# Patient Record
Sex: Female | Born: 2000 | Race: Black or African American | Hispanic: No | Marital: Single | Smoking: Former smoker
Health system: Southern US, Community
[De-identification: ages and names within clinical notes are randomized; demographics above are authoritative.]

## PROBLEM LIST (undated history)

## (undated) DIAGNOSIS — R1116 Cannabis hyperemesis syndrome: Secondary | ICD-10-CM

## (undated) DIAGNOSIS — K219 Gastro-esophageal reflux disease without esophagitis: Secondary | ICD-10-CM

## (undated) DIAGNOSIS — Z7289 Other problems related to lifestyle: Secondary | ICD-10-CM

## (undated) DIAGNOSIS — R519 Headache, unspecified: Secondary | ICD-10-CM

## (undated) DIAGNOSIS — R1110 Vomiting, unspecified: Secondary | ICD-10-CM

## (undated) DIAGNOSIS — E119 Type 2 diabetes mellitus without complications: Secondary | ICD-10-CM

---
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
## 2018-04-24 DIAGNOSIS — U071 COVID-19: Secondary | ICD-10-CM

## 2018-04-24 HISTORY — DX: COVID-19: U07.1

## 2018-07-16 DIAGNOSIS — E860 Dehydration: Secondary | ICD-10-CM | POA: Insufficient documentation

## 2019-02-19 ENCOUNTER — Inpatient Hospital Stay (HOSPITAL_COMMUNITY)
Admission: EM | Admit: 2019-02-19 | Discharge: 2019-02-22 | DRG: 639 | Disposition: A | Discharge status: Home / Self Care | Payer: Federal, State, Local not specified - PPO | Attending: Family Medicine | Admitting: Family Medicine

## 2019-02-19 ENCOUNTER — Other Ambulatory Visit: Payer: Self-pay

## 2019-02-19 ENCOUNTER — Encounter (HOSPITAL_COMMUNITY): Payer: Self-pay | Admitting: Emergency Medicine

## 2019-02-19 DIAGNOSIS — E860 Dehydration: Secondary | ICD-10-CM | POA: Diagnosis not present

## 2019-02-19 DIAGNOSIS — R1110 Vomiting, unspecified: Secondary | ICD-10-CM

## 2019-02-19 DIAGNOSIS — R112 Nausea with vomiting, unspecified: Secondary | ICD-10-CM

## 2019-02-19 DIAGNOSIS — E1065 Type 1 diabetes mellitus with hyperglycemia: Principal | ICD-10-CM | POA: Diagnosis present

## 2019-02-19 DIAGNOSIS — R1115 Cyclical vomiting syndrome unrelated to migraine: Secondary | ICD-10-CM | POA: Diagnosis present

## 2019-02-19 DIAGNOSIS — R739 Hyperglycemia, unspecified: Secondary | ICD-10-CM

## 2019-02-19 DIAGNOSIS — N946 Dysmenorrhea, unspecified: Secondary | ICD-10-CM | POA: Diagnosis present

## 2019-02-19 DIAGNOSIS — E876 Hypokalemia: Secondary | ICD-10-CM | POA: Diagnosis present

## 2019-02-19 DIAGNOSIS — F1290 Cannabis use, unspecified, uncomplicated: Secondary | ICD-10-CM | POA: Diagnosis present

## 2019-02-19 DIAGNOSIS — A084 Viral intestinal infection, unspecified: Secondary | ICD-10-CM | POA: Diagnosis present

## 2019-02-19 DIAGNOSIS — Z794 Long term (current) use of insulin: Secondary | ICD-10-CM

## 2019-02-19 DIAGNOSIS — E109 Type 1 diabetes mellitus without complications: Secondary | ICD-10-CM | POA: Diagnosis present

## 2019-02-19 DIAGNOSIS — Z833 Family history of diabetes mellitus: Secondary | ICD-10-CM

## 2019-02-19 DIAGNOSIS — Z79899 Other long term (current) drug therapy: Secondary | ICD-10-CM

## 2019-02-19 DIAGNOSIS — E1029 Type 1 diabetes mellitus with other diabetic kidney complication: Secondary | ICD-10-CM | POA: Clinically undetermined

## 2019-02-19 DIAGNOSIS — Z20828 Contact with and (suspected) exposure to other viral communicable diseases: Secondary | ICD-10-CM | POA: Diagnosis present

## 2019-02-19 HISTORY — DX: Other problems related to lifestyle: Z72.89

## 2019-02-19 LAB — URINALYSIS, ROUTINE W REFLEX MICROSCOPIC
Bacteria, UA: NONE SEEN
Bilirubin Urine: NEGATIVE
Glucose, UA: >=500 mg/dL — AB
Ketones, ur: 20 mg/dL — AB
Leukocytes,Ua: NEGATIVE
Nitrite: NEGATIVE
Protein, ur: NEGATIVE mg/dL
Specific Gravity, Urine: 1.035 — ABNORMAL HIGH (ref 1.005–1.030)
pH: 7 (ref 5.0–8.0)

## 2019-02-19 LAB — CBC
HCT: 42 % (ref 36.0–46.0)
Hemoglobin: 14.3 g/dL (ref 12.0–15.0)
MCH: 31.4 pg (ref 26.0–34.0)
MCHC: 34 g/dL (ref 30.0–36.0)
MCV: 92.3 fL (ref 80.0–100.0)
Platelets: 498 10*3/uL — ABNORMAL HIGH (ref 150–400)
RBC: 4.55 MIL/uL (ref 3.87–5.11)
RDW: 12.9 % (ref 11.5–15.5)
WBC: 17.2 10*3/uL — ABNORMAL HIGH (ref 4.0–10.5)
nRBC: 0 % (ref 0.0–0.2)

## 2019-02-19 LAB — BASIC METABOLIC PANEL
Anion gap: 15 (ref 5–15)
BUN: 10 mg/dL (ref 6–20)
CO2: 19 mmol/L — ABNORMAL LOW (ref 22–32)
Calcium: 9.8 mg/dL (ref 8.9–10.3)
Chloride: 103 mmol/L (ref 98–111)
Creatinine, Ser: 0.76 mg/dL (ref 0.44–1.00)
GFR calc Af Amer: >60 mL/min (ref >60)
GFR calc non Af Amer: >60 mL/min (ref >60)
Glucose, Bld: 315 mg/dL — ABNORMAL HIGH (ref 70–99)
Potassium: 4 mmol/L (ref 3.5–5.1)
Sodium: 137 mmol/L (ref 135–145)

## 2019-02-19 LAB — COMPREHENSIVE METABOLIC PANEL
ALT: 17 U/L (ref 0–44)
AST: 21 U/L (ref 15–41)
Albumin: 4.1 g/dL (ref 3.5–5.0)
Alkaline Phosphatase: 71 U/L (ref 38–126)
Anion gap: 16 — ABNORMAL HIGH (ref 5–15)
BUN: 10 mg/dL (ref 6–20)
CO2: 18 mmol/L — ABNORMAL LOW (ref 22–32)
Calcium: 9.2 mg/dL (ref 8.9–10.3)
Chloride: 101 mmol/L (ref 98–111)
Creatinine, Ser: 1 mg/dL (ref 0.44–1.00)
GFR calc Af Amer: >60 mL/min (ref >60)
GFR calc non Af Amer: >60 mL/min (ref >60)
Glucose, Bld: 417 mg/dL — ABNORMAL HIGH (ref 70–99)
Potassium: 3.2 mmol/L — ABNORMAL LOW (ref 3.5–5.1)
Sodium: 135 mmol/L (ref 135–145)
Total Bilirubin: 0.5 mg/dL (ref 0.3–1.2)
Total Protein: 7.4 g/dL (ref 6.5–8.1)

## 2019-02-19 LAB — POCT I-STAT EG7
Acid-base deficit: 6 mmol/L — ABNORMAL HIGH (ref 0.0–2.0)
Bicarbonate: 19 mmol/L — ABNORMAL LOW (ref 20.0–28.0)
Calcium, Ion: 1.17 mmol/L (ref 1.15–1.40)
HCT: 48 % — ABNORMAL HIGH (ref 36.0–46.0)
Hemoglobin: 16.3 g/dL — ABNORMAL HIGH (ref 12.0–15.0)
O2 Saturation: 57 %
Potassium: 4 mmol/L (ref 3.5–5.1)
Sodium: 138 mmol/L (ref 135–145)
TCO2: 20 mmol/L — ABNORMAL LOW (ref 22–32)
pCO2, Ven: 36.6 mmHg — ABNORMAL LOW (ref 44.0–60.0)
pH, Ven: 7.324 (ref 7.250–7.430)
pO2, Ven: 32 mmHg (ref 32.0–45.0)

## 2019-02-19 LAB — CBG MONITORING, ED
Glucose-Capillary: 394 mg/dL — ABNORMAL HIGH (ref 70–99)
Glucose-Capillary: 221 mg/dL — ABNORMAL HIGH (ref 70–99)

## 2019-02-19 LAB — LACTIC ACID, PLASMA
Lactic Acid, Venous: 3 mmol/L (ref 0.5–1.9)
Lactic Acid, Venous: 3.1 mmol/L (ref 0.5–1.9)

## 2019-02-19 LAB — I-STAT BETA HCG BLOOD, ED (MC, WL, AP ONLY): I-stat hCG, quantitative: <5 m[IU]/mL (ref <5)

## 2019-02-19 LAB — LIPASE, BLOOD: Lipase: 23 U/L (ref 11–51)

## 2019-02-19 MED ORDER — MORPHINE SULFATE (PF) 4 MG/ML IV SOLN
4.0000 mg | Freq: Once | INTRAVENOUS | Status: AC
  Administered 2019-02-19: 4 mg via INTRAVENOUS
  Filled 2019-02-19: qty 1

## 2019-02-19 MED ORDER — SODIUM CHLORIDE 0.9 % IV BOLUS
1000.0000 mL | Freq: Once | INTRAVENOUS | Status: AC
  Administered 2019-02-19: 1000 mL via INTRAVENOUS

## 2019-02-19 MED ORDER — HYDROCODONE-ACETAMINOPHEN 5-325 MG PO TABS: 1.0000 | ORAL_TABLET | Freq: Once | ORAL | Status: DC

## 2019-02-19 MED ORDER — INSULIN ASPART 100 UNIT/ML ~~LOC~~ SOLN
10.0000 [IU] | Freq: Once | SUBCUTANEOUS | Status: AC
  Administered 2019-02-19: 10 [IU] via INTRAVENOUS

## 2019-02-19 MED ORDER — POTASSIUM CHLORIDE 10 MEQ/100ML IV SOLN
10.0000 meq | INTRAVENOUS | Status: AC
  Administered 2019-02-19 (×2): 10 meq via INTRAVENOUS
  Filled 2019-02-19: qty 100

## 2019-02-19 MED ORDER — SODIUM CHLORIDE 0.9% FLUSH
3.0000 mL | Freq: Once | INTRAVENOUS | Status: AC
  Administered 2019-02-19: 3 mL via INTRAVENOUS

## 2019-02-19 MED ORDER — PROMETHAZINE HCL 25 MG/ML IJ SOLN
25.0000 mg | Freq: Four times a day (QID) | INTRAMUSCULAR | Status: DC | PRN
  Administered 2019-02-19 (×2): 25 mg via INTRAVENOUS
  Filled 2019-02-19: qty 1

## 2019-02-19 MED ORDER — ONDANSETRON HCL 4 MG/2ML IJ SOLN
4.0000 mg | Freq: Once | INTRAMUSCULAR | Status: AC
  Administered 2019-02-19: 4 mg via INTRAVENOUS
  Filled 2019-02-19: qty 2

## 2019-02-19 MED ORDER — ONDANSETRON 4 MG PO TBDP: 4.0000 mg | ORAL_TABLET | Freq: Once | ORAL | Status: DC | PRN

## 2019-02-19 MED ORDER — KETOROLAC TROMETHAMINE 30 MG/ML IJ SOLN
30.0000 mg | Freq: Once | INTRAMUSCULAR | Status: AC
  Administered 2019-02-19: 30 mg via INTRAVENOUS
  Filled 2019-02-19: qty 1

## 2019-02-19 NOTE — ED Notes (Signed)
2nd IV blown with infiltration and pain noted.  IV team consult order placed.

## 2019-02-19 NOTE — ED Provider Notes (Signed)
[facility] EMERGENCY DEPARTMENT Provider Note   CSN: [id number] Arrival date & time: 02/19/19  0505     History   Chief Complaint Chief Complaint  Patient presents with  . Emesis    HPI Judy Cook is a 18 y.o. female.     HPI Patient presents to the emergency department with nausea vomiting that started around 2 AM.  The patient states that she took her Lantus and NovoLog prior to arrival.  The patient denies chest pain, shortness of breath, headache,blurred vision, neck pain, fever, cough, weakness, numbness, dizziness, anorexia, edema, abdominal pain,  diarrhea, rash, back pain, dysuria, hematemesis, bloody stool, near syncope, or syncope.  Patient states that she has not been able to keep any fluids down since that time.  Patient not take any other medications prior to arrival for her symptoms. Past Medical History:  Diagnosis Date  . Diabetes mellitus without complication (HCC)     There are no active problems to display for this patient.   History reviewed. No pertinent surgical history.   OB History   No obstetric history on file.      Home Medications    Prior to Admission medications   Medication Sig Start Date End Date Taking? Authorizing Provider  LANTUS SOLOSTAR 100 UNIT/ML Solostar Pen Inject 23 Units into the skin at bedtime. 02/04/19  Yes [provider]  Multiple Vitamins-Minerals (MULTIVITAMIN WITH MINERALS) tablet Take 1 tablet by mouth daily.   Yes [provider]  naproxen (NAPROSYN) 500 MG tablet Take 500 mg by mouth 2 (two) times daily. 09/17/18  Yes [provider]  NOVOLOG FLEXPEN 100 UNIT/ML FlexPen Inject 12 Units into the skin 3 (three) times daily before meals. Ratio/sliding scale ( Adjust carb) 02/04/19  Yes [provider]    Family History No family history on file.  Social History Social History   Tobacco Use  . Smoking status: Never Smoker  . Smokeless tobacco:  Never Used  Substance Use Topics  . Alcohol use: Never    Frequency: Never  . Drug use: Never     Allergies   Patient has no known allergies.   Review of Systems Review of Systems All other systems negative except as documented in the HPI. All pertinent positives and negatives as reviewed in the HPI.  Physical Exam Updated Vital Signs BP 130/83   Pulse 66   Temp 98.1 F (36.7 C) (Oral)   Resp 17   LMP 02/18/2019   SpO2 100%   Physical Exam Vitals signs and nursing note reviewed.  Constitutional:      General: She is not in acute distress.    Appearance: She is well-developed.  HENT:     Head: Normocephalic and atraumatic.  Eyes:     Pupils: Pupils are equal, round, and reactive to light.  Neck:     Musculoskeletal: Normal range of motion and neck supple.  Cardiovascular:     Rate and Rhythm: Normal rate and regular rhythm.     Heart sounds: Normal heart sounds. No murmur. No friction rub. No gallop.   Pulmonary:     Effort: Pulmonary effort is normal. No respiratory distress.     Breath sounds: Normal breath sounds. No wheezing.  Abdominal:     General: Bowel sounds are normal. There is no distension.     Palpations: Abdomen is soft.     Tenderness: There is no abdominal tenderness.  Skin:    General: Skin is warm and  dry.     Capillary Refill: Capillary refill takes less than 2 seconds.     Findings: No erythema or rash.  Neurological:     Mental Status: She is alert and oriented to person, place, and time.     Motor: No abnormal muscle tone.     Coordination: Coordination normal.  Psychiatric:        Behavior: Behavior normal.      ED Treatments / Results  Labs (all labs ordered are listed, but only abnormal results are displayed) Labs Reviewed  COMPREHENSIVE METABOLIC PANEL - Abnormal; Notable for the following components:      Result Value   Potassium 3.2 (*)    CO2 18 (*)    Glucose, Bld 417 (*)    Anion gap 16 (*)    All other components  within normal limits  CBC - Abnormal; Notable for the following components:   WBC 17.2 (*)    Platelets 498 (*)    All other components within normal limits  URINALYSIS, ROUTINE W REFLEX MICROSCOPIC - Abnormal; Notable for the following components:   Color, Urine STRAW (*)    Specific Gravity, Urine 1.035 (*)    Glucose, UA >=500 (*)    Hgb urine dipstick LARGE (*)    Ketones, ur 20 (*)    All other components within normal limits  BASIC METABOLIC PANEL - Abnormal; Notable for the following components:   CO2 19 (*)    Glucose, Bld 315 (*)    All other components within normal limits  LACTIC ACID, PLASMA - Abnormal; Notable for the following components:   Lactic Acid, Venous 3.0 (*)    All other components within normal limits  CBG MONITORING, ED - Abnormal; Notable for the following components:   Glucose-Capillary 394 (*)    All other components within normal limits  POCT I-STAT EG7 - Abnormal; Notable for the following components:   pCO2, Ven 36.6 (*)    Bicarbonate 19.0 (*)    TCO2 20 (*)    Acid-base deficit 6.0 (*)    HCT 48.0 (*)    Hemoglobin 16.3 (*)    All other components within normal limits  LIPASE, BLOOD  LACTIC ACID, PLASMA  I-STAT BETA HCG BLOOD, ED ([facility], [facility], [facility] ONLY)    EKG None  Radiology No results found.  Procedures Procedures (including critical care time)  Medications Ordered in ED Medications  sodium chloride 0.9 % bolus 1,000 mL (has no administration in time range)  promethazine (PHENERGAN) injection 25 mg (has no administration in time range)  ketorolac (TORADOL) 30 MG/ML injection 30 mg (has no administration in time range)  insulin aspart (novoLOG) injection 10 Units (has no administration in time range)  sodium chloride flush (NS) 0.9 % injection 3 mL (3 mLs Intravenous Given 02/19/19 1251)  sodium chloride 0.9 % bolus 1,000 mL (1,000 mLs Intravenous New Bag/Given 02/19/19 1251)  ondansetron (ZOFRAN) injection 4 mg (4 mg Intravenous Given  02/19/19 1306)  potassium chloride 10 mEq in 100 mL IVPB (10 mEq Intravenous New Bag/Given 02/19/19 1310)     Initial Impression / Assessment and Plan / ED Course  I have reviewed the triage vital signs and the nursing notes.  Pertinent labs & imaging results that were available during my care of the patient were reviewed by me and considered in my medical decision making (see chart for details).  Clinical Course as of Feb 18 1442  Wed Feb 19, 2019  1134 Ketones, ur(!): 20 [  CA]    Clinical Course User Index [CA] T[place], [place], [place]       Patient did have some initial findings that showed dehydration and those seem to have corrected somewhat on reevaluation of her blood work.  Patient had yet to receive a second liter of fluid.  Patient will be given further antiemetics and a second liter of fluid.  The patient's IV had blown.  Patient will be reevaluated after the antiemetics and further fluids.  Final Clinical Impressions(s) / ED Diagnoses   Final diagnoses:  None    ED Discharge Orders    None       Dalia Heading, PA-C 02/19/19 1446    Little, Wenda Overland, MD 02/20/19 2352

## 2019-02-19 NOTE — ED Notes (Signed)
IV restarted by IV team.  Pt has had intermittent dry heaves, is resting at this time.  IV infusing well without difficulties at this time.

## 2019-02-19 NOTE — ED Triage Notes (Signed)
Pt c/o abdominal nausea/vomiting that started yesterday. Hx type 1 diabetes. A&O x 4.

## 2019-02-19 NOTE — ED Notes (Signed)
ED Provider at bedside. 

## 2019-02-19 NOTE — H&P (Addendum)
Family Medicine Teaching [facility] Admission History and Physical Service Pager: [phone]  Patient name: Judy Cook Medical record number: [id number] Date of birth: 12-30-2000 Age: 18 y.o. Gender: female  Primary Care Provider: System, Pcp Not In Consultants: None Code Status: Full Preferred Emergency Contact: Sister, Judy Cook    Chief Complaint: nausea, emesis and abdominal pain   Assessment and Plan: Judy Cook is a 18 y.o. female presenting with abdominal pain and emesis for 1 day. PMH is significant for T1DM.   Lactic Acidosis, Hyperglycemia and Emesis in setting of T1DM Judy Cook reports that at 2 AM on 10/28 she began having nausea and emesis with inability to tolerate p.o. solids or fluids.  Patient states that she has had multiple episodes like this in the past and has required admission to the hospital with the last episode being in [facility]. Home medications include 23 untis of Lantus at night with Cook units TID with meals.  Patient states that she moves around frequently and has to reestablish care often.  Patient states that she has established care with an endocrinologist in town. Patient states that she was diagnosed with type 1 diabetes at the age of 89.  Patient slept significant for leukocytosis of 17, glucose elevated it 394 initially, elevated lactic acid of 3.0, 3.1 on repeat, UA with elevated specific gravity, greater than 500 of glucose, large hemoglobin and ketones.  No DKA on admission. Of note, patient initially had anion gap elevated at 16. Patient's last A1c 10.3 on 12/10/2018. Patient's hemoglobin A1c subsequently found to be elevated at 10.8 here.  Nausea and emesis  in the setting of viral gastroenteritis given acuteness of illness. Patient also endorses smoking marijuana frequently and given that she has had multiple hospitalizations for the same cause could be secondary to cannabis use. -Place in observation- MedSurg,  attending Dr. McDiarmid -Continue mIVF NS with potassium chloride at 100 mL/h -Zofran 18 Tylenol 650 as needed -CBG monitoring with meals and at bedtime -Sensitive sliding scale insulin -Lantus 23 units daily at bedtime -NovoLog 10 units with meals 3 times daily only if patient is able to tolerate p.o. -Trend lactic acid  -Monitor I's and O's -Will advance diet as tolerated, soft diet now -Vitals per floor protocol  Hypokalemia Patient admitted with potassium of 3.2.  Patient received IV potassium with repeat BMP showing correction of potassium to 4. -A.m. BMP  Leukocytosis WBC 17.2> 19.5 on 10/29 with an ANC of 16.7.  Patient afebrile since admission.  Could be related to GI source of infection.   - Continue to monitor  FEN/GI: Soft diet to advance as tolerated Prophylaxis: Lovenox 40mg   Disposition: To MedSurg for observation  History of Present Illness:  Judy Cook is a 18 y.o. female presenting with emesis and inability to keep down p.o. fluids and food for 1 day.  Judy Cook reports that around 2 am on the morning of 10/28, she started having issues with gagging and feeling like nauseous with abdominal pain. She says she was having fevers and chills. Patient felt that she was becoming dehydrated because her mouth  felt very dry and she decided she needed to go to the ED. Judy Cook/26 describes having a discomfort in her stomach more than having "pain" with the sensation that she may become nauseous. She also had some diarrhea prior to presenting to the ED.  Patient states that she has type 1 diabetes and knew that she should come in if she is  sick. She has been hospitalized twice in the past few months. She has not had her lantus for tonight. She usually takes lantus 23 units and Cook units with meals. Patient denies introducing any new foods to her diet or any sick contacts or anyone around her with similar symptoms.  Patient states that she was able to eat chicken  alfredo prior to coming into the ED. She believes that when she is stressed or having menstrual cramps is when she tends to get nauseous and start to vomit. She would like to have a continuous glucose monitor and is currently working with her endocrinologist towards this goal.   She is currently menstruating and has had some irregular cycles.  Patient also mentions that she may have some undiagnosed depression and has never been on medication for this. Patient admits to previous thoughts of hurting herself in the past, but none currently. Patient denies previous suicidal attempts but does report trying self harming behavior such as cutting herself years ago. Patient adds that she notices that she has mood swings occassionally.   ED Course:  Patient initially had elevated BG of 394, elevated lactic acid of 3.0, AG of 16, leukocytosis 17.2 and hypokalemia 3.2, U/A with glucose, increased spec grav, large hemoglobin and ketones. Patient was given potassium, toradol, given 3 NS boluses and morphine for pain. Patient had no improvement in her LA (3.1 on repeat) and continued to have emesis so she was deemed appropriate for admission for hydration.  Had IV K, with improvement   Review Of Systems: Per HPI with the following additions:   Review of Systems  Constitutional: Positive for chills, fever and weight loss.  HENT: Negative for sore throat.   Respiratory: Negative for cough and shortness of breath.   Cardiovascular: Negative for chest pain.  Gastrointestinal: Positive for abdominal pain, diarrhea, nausea and vomiting. Negative for blood in stool, constipation and melena.  Genitourinary: Negative for dysuria, frequency and urgency.  Neurological: Negative for headaches.    Patient Active Problem List   Diagnosis Date Noted  . Hyperglycemia due to type 1 diabetes mellitus ([place]) 02/19/2019    Past Medical History: Past Medical History:  Diagnosis Date  . Diabetes mellitus without  complication [facility])     Past Surgical History: History reviewed. No pertinent surgical history.  Social History: Social History   Tobacco Use  . Smoking status: Never Smoker  . Smokeless tobacco: Never Used  Substance Use Topics  . Alcohol use: Never    Frequency: Never  . 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    Family History: Family History  Problem Relation Age of Onset  . Diabetes Paternal Grandfather     Allergies and Medications: No Known Allergies No current facility-administered medications on file prior to encounter.    Current Outpatient Medications on File Prior to Encounter  Medication Sig Dispense Refill  . LANTUS SOLOSTAR 100 UNIT/ML Solostar Pen Inject 23 Units into the skin at bedtime.    . Multiple Vitamins-Minerals (MULTIVITAMIN WITH MINERALS) tablet Take 1 tablet by mouth daily.    . naproxen (NAPROSYN) 500 MG tablet Take 500 mg by mouth 2 (two) times daily.    Marland Kitchen NOVOLOG FLEXPEN 100 UNIT/ML FlexPen Inject Cook Units into the skin 3 (three) times daily before meals. Ratio/sliding scale (  Adjust carb)      Objective: BP 127/82 (BP Location: Right Arm)   Pulse 77   Temp 97.9 F (36.6 C) (Oral)   Resp 16   Wt 54.9 kg   LMP 02/18/2019   SpO2 99%   Exam: General: female appearing stated age in NAD lying in bed  Eyes: no conjunctival injection or scleral icterus Cardiovascular: RRR without murmurs, gallops or friction rubs  Respiratory: CTAB without wheezing or crackles, stable on RA  Gastrointestinal: mild abdominal tenderness to palpation  MSK: moves extremities with normal ROM  Derm: no rashes or lesions noted on exam Neuro: alert and oriented x4 Psych: patient responds appropriately to exam questions, no pressured speech  Labs and Imaging: CBC  BMET  Recent Labs  Lab 02/20/19 0252  WBC 19.5*  HGB Cook.6  HCT 37.1  PLT 366   Recent Labs  Lab 02/20/19 0252  NA 134*  K 4.0  CL 107  CO2 15*  BUN 9  CREATININE 0.71  GLUCOSE 276*  CALCIUM 8.3Nicki [place]*      Simmons, Makiera, MD 02/20/2019, 5:30 AM PGY-1, [facility] FPTS Intern pager: [phone], text pages welcome  FPTS Upper-Level Resident Addendum   I have independently interviewed and examined the patient. I have discussed the above with the original author and agree with their documentation. My edits for correction/addition/clarification are in purple. Please see also any attending notes.    [place] Jonathin Heinicke, DO PGY-3, [facility] 02/20/2019 6:19 AM  FPTS Service pager: [phone] (text pages welcome through [facility])

## 2019-02-19 NOTE — ED Provider Notes (Signed)
3:42 PM Care assumed from [facility].  At time of transfer care, patient is awaiting results of repeat lactic acid after second liter of fluids.  Patient was not found to have DKA on arrival.  Patient is able to have improving labs and is able to tolerate oral hydration with antiemetics, anticipate discharge home for outpatient PCP follow-up.  If patient is unable to maintain hydration with antiemetics, anticipate admission due to nausea and vomiting and lactic acidosis.  9:36 PM Despite fluids, lactic acid did not significantly improve.  Patient was given different nausea medicine and pain medicine and continues to have abdominal pain nausea and vomiting is unable to tolerate p.o.  Patient was not in DKA on arrival but she did have decreased bicarb.  Patient was given potassium and fluids.  No evidence of infectious process.  On my reassessment she is still having vomiting and pain.  She will be given morphine and more fluids.  Patient will be admitted for further management of intolerance of p.o., lactic acidosis, decreased bicarb, and hyperglycemia.  Clinical Impression: 1. Dehydration   2. Intractable vomiting with nausea, unspecified vomiting type   3. Hypokalemia   4. Hyperglycemia     Disposition: Admit  This note was prepared with assistance of Dragon voice recognition software. Occasional wrong-word or sound-a-like substitutions may have occurred due to the inherent limitations of voice recognition software.     Deldrick Linch, Gwenyth Allegra, MD 02/19/19 [phone]

## 2019-02-19 NOTE — ED Notes (Addendum)
Informed Dr. Rex Kras of pt's lab results (I-stat EG7).

## 2019-02-19 NOTE — ED Notes (Signed)
Pt asking for water and pain medication.

## 2019-02-19 NOTE — ED Notes (Signed)
Pt threw up in emesis bag.

## 2019-02-20 ENCOUNTER — Encounter (HOSPITAL_COMMUNITY): Payer: Self-pay | Admitting: Family Medicine

## 2019-02-20 ENCOUNTER — Other Ambulatory Visit: Payer: Self-pay

## 2019-02-20 DIAGNOSIS — R1115 Cyclical vomiting syndrome unrelated to migraine: Secondary | ICD-10-CM | POA: Diagnosis not present

## 2019-02-20 DIAGNOSIS — Z794 Long term (current) use of insulin: Secondary | ICD-10-CM | POA: Diagnosis not present

## 2019-02-20 DIAGNOSIS — Z833 Family history of diabetes mellitus: Secondary | ICD-10-CM | POA: Diagnosis not present

## 2019-02-20 DIAGNOSIS — R739 Hyperglycemia, unspecified: Secondary | ICD-10-CM | POA: Diagnosis not present

## 2019-02-20 DIAGNOSIS — E1065 Type 1 diabetes mellitus with hyperglycemia: Secondary | ICD-10-CM | POA: Diagnosis present

## 2019-02-20 DIAGNOSIS — E860 Dehydration: Secondary | ICD-10-CM | POA: Diagnosis present

## 2019-02-20 DIAGNOSIS — E876 Hypokalemia: Secondary | ICD-10-CM | POA: Diagnosis present

## 2019-02-20 DIAGNOSIS — N946 Dysmenorrhea, unspecified: Secondary | ICD-10-CM | POA: Diagnosis present

## 2019-02-20 DIAGNOSIS — R112 Nausea with vomiting, unspecified: Secondary | ICD-10-CM | POA: Diagnosis not present

## 2019-02-20 DIAGNOSIS — Z20828 Contact with and (suspected) exposure to other viral communicable diseases: Secondary | ICD-10-CM | POA: Diagnosis present

## 2019-02-20 DIAGNOSIS — E1029 Type 1 diabetes mellitus with other diabetic kidney complication: Secondary | ICD-10-CM | POA: Clinically undetermined

## 2019-02-20 DIAGNOSIS — F1290 Cannabis use, unspecified, uncomplicated: Secondary | ICD-10-CM | POA: Diagnosis present

## 2019-02-20 DIAGNOSIS — A084 Viral intestinal infection, unspecified: Secondary | ICD-10-CM | POA: Diagnosis present

## 2019-02-20 DIAGNOSIS — Z79899 Other long term (current) drug therapy: Secondary | ICD-10-CM | POA: Diagnosis not present

## 2019-02-20 HISTORY — DX: Dysmenorrhea, unspecified: N94.6

## 2019-02-20 LAB — HEMOGLOBIN A1C
Hgb A1c MFr Bld: 10.8 % — ABNORMAL HIGH (ref 4.8–5.6)
Mean Plasma Glucose: 263.26 mg/dL

## 2019-02-20 LAB — BASIC METABOLIC PANEL
Anion gap: 12 (ref 5–15)
Anion gap: 14 (ref 5–15)
BUN: 10 mg/dL (ref 6–20)
BUN: 9 mg/dL (ref 6–20)
CO2: 15 mmol/L — ABNORMAL LOW (ref 22–32)
CO2: 15 mmol/L — ABNORMAL LOW (ref 22–32)
Calcium: 8.3 mg/dL — ABNORMAL LOW (ref 8.9–10.3)
Calcium: 8.4 mg/dL — ABNORMAL LOW (ref 8.9–10.3)
Chloride: 106 mmol/L (ref 98–111)
Chloride: 107 mmol/L (ref 98–111)
Creatinine, Ser: 0.71 mg/dL (ref 0.44–1.00)
Creatinine, Ser: 0.79 mg/dL (ref 0.44–1.00)
GFR calc Af Amer: >60 mL/min (ref >60)
GFR calc Af Amer: >60 mL/min (ref >60)
GFR calc non Af Amer: >60 mL/min (ref >60)
GFR calc non Af Amer: >60 mL/min (ref >60)
Glucose, Bld: 266 mg/dL — ABNORMAL HIGH (ref 70–99)
Glucose, Bld: 276 mg/dL — ABNORMAL HIGH (ref 70–99)
Potassium: 4 mmol/L (ref 3.5–5.1)
Potassium: 4.2 mmol/L (ref 3.5–5.1)
Sodium: 134 mmol/L — ABNORMAL LOW (ref 135–145)
Sodium: 135 mmol/L (ref 135–145)

## 2019-02-20 LAB — CBC WITH DIFFERENTIAL/PLATELET
Abs Immature Granulocytes: 0.16 10*3/uL — ABNORMAL HIGH (ref 0.00–0.07)
Basophils Absolute: 0 10*3/uL (ref 0.0–0.1)
Basophils Relative: 0 %
Eosinophils Absolute: 0 10*3/uL (ref 0.0–0.5)
Eosinophils Relative: 0 %
HCT: 37.1 % (ref 36.0–46.0)
Hemoglobin: 12.6 g/dL (ref 12.0–15.0)
Immature Granulocytes: 1 %
Lymphocytes Relative: 7 %
Lymphs Abs: 1.3 10*3/uL (ref 0.7–4.0)
MCH: 31.1 pg (ref 26.0–34.0)
MCHC: 34 g/dL (ref 30.0–36.0)
MCV: 91.6 fL (ref 80.0–100.0)
Monocytes Absolute: 1.3 10*3/uL — ABNORMAL HIGH (ref 0.1–1.0)
Monocytes Relative: 7 %
Neutro Abs: 16.7 10*3/uL — ABNORMAL HIGH (ref 1.7–7.7)
Neutrophils Relative %: 85 %
Platelets: 366 10*3/uL (ref 150–400)
RBC: 4.05 MIL/uL (ref 3.87–5.11)
RDW: 13.2 % (ref 11.5–15.5)
WBC: 19.5 10*3/uL — ABNORMAL HIGH (ref 4.0–10.5)
nRBC: 0 % (ref 0.0–0.2)

## 2019-02-20 LAB — GLUCOSE, CAPILLARY
Glucose-Capillary: 244 mg/dL — ABNORMAL HIGH (ref 70–99)
Glucose-Capillary: 284 mg/dL — ABNORMAL HIGH (ref 70–99)
Glucose-Capillary: 212 mg/dL — ABNORMAL HIGH (ref 70–99)
Glucose-Capillary: 248 mg/dL — ABNORMAL HIGH (ref 70–99)
Glucose-Capillary: 177 mg/dL — ABNORMAL HIGH (ref 70–99)

## 2019-02-20 LAB — SARS CORONAVIRUS 2 (TAT 6-24 HRS): SARS Coronavirus 2: NEGATIVE

## 2019-02-20 LAB — LACTIC ACID, PLASMA
Lactic Acid, Venous: 1.2 mmol/L (ref 0.5–1.9)
Lactic Acid, Venous: 1.6 mmol/L (ref 0.5–1.9)

## 2019-02-20 LAB — HIV ANTIBODY (ROUTINE TESTING W REFLEX): HIV Screen 4th Generation wRfx: NONREACTIVE

## 2019-02-20 MED ORDER — ONDANSETRON HCL 4 MG PO TABS
8.0000 mg | ORAL_TABLET | Freq: Four times a day (QID) | ORAL | Status: DC | PRN
  Administered 2019-02-20: 8 mg via ORAL
  Filled 2019-02-20: qty 2

## 2019-02-20 MED ORDER — SODIUM CHLORIDE 0.9 % IV SOLN
8.0000 mg | Freq: Four times a day (QID) | INTRAVENOUS | Status: DC | PRN
  Filled 2019-02-20: qty 4

## 2019-02-20 MED ORDER — ACETAMINOPHEN 650 MG RE SUPP: 650.0000 mg | Freq: Four times a day (QID) | RECTAL | Status: DC | PRN

## 2019-02-20 MED ORDER — INSULIN GLARGINE 100 UNIT/ML ~~LOC~~ SOLN
10.0000 [IU] | Freq: Every day | SUBCUTANEOUS | Status: AC
  Administered 2019-02-20: 10 [IU] via SUBCUTANEOUS
  Filled 2019-02-20: qty 0.1

## 2019-02-20 MED ORDER — KETOROLAC TROMETHAMINE 15 MG/ML IJ SOLN
15.0000 mg | Freq: Once | INTRAMUSCULAR | Status: AC
  Administered 2019-02-20: 15 mg via INTRAVENOUS
  Filled 2019-02-20: qty 1

## 2019-02-20 MED ORDER — ACETAMINOPHEN 325 MG PO TABS
650.0000 mg | ORAL_TABLET | Freq: Four times a day (QID) | ORAL | Status: DC | PRN
  Administered 2019-02-20: 650 mg via ORAL
  Filled 2019-02-20: qty 2

## 2019-02-20 MED ORDER — ONDANSETRON HCL 4 MG/2ML IJ SOLN
INTRAMUSCULAR | Status: AC
  Administered 2019-02-20: 4 mg
  Filled 2019-02-20: qty 4

## 2019-02-20 MED ORDER — PROMETHAZINE HCL 25 MG/ML IJ SOLN
25.0000 mg | Freq: Four times a day (QID) | INTRAMUSCULAR | Status: DC | PRN
  Administered 2019-02-20 – 2019-02-21 (×2): 25 mg via INTRAVENOUS
  Filled 2019-02-20 (×2): qty 1

## 2019-02-20 MED ORDER — KETOROLAC TROMETHAMINE 15 MG/ML IJ SOLN
15.0000 mg | Freq: Four times a day (QID) | INTRAMUSCULAR | Status: DC | PRN
  Administered 2019-02-20 – 2019-02-21 (×3): 15 mg via INTRAVENOUS
  Filled 2019-02-20 (×3): qty 1

## 2019-02-20 MED ORDER — CAPSAICIN 0.075 % EX CREA: TOPICAL_CREAM | Freq: Two times a day (BID) | CUTANEOUS | Status: DC

## 2019-02-20 MED ORDER — INSULIN GLARGINE 100 UNIT/ML ~~LOC~~ SOLN
23.0000 [IU] | Freq: Every day | SUBCUTANEOUS | Status: DC
  Administered 2019-02-21: 11 [IU] via SUBCUTANEOUS
  Filled 2019-02-20: qty 0.23

## 2019-02-20 MED ORDER — SODIUM CHLORIDE 0.9 % IV SOLN
INTRAVENOUS | Status: DC
  Administered 2019-02-20 – 2019-02-22 (×3): via INTRAVENOUS

## 2019-02-20 MED ORDER — ONDANSETRON HCL 4 MG PO TABS: 4.0000 mg | ORAL_TABLET | Freq: Four times a day (QID) | ORAL | Status: DC | PRN

## 2019-02-20 MED ORDER — INSULIN GLARGINE 100 UNIT/ML ~~LOC~~ SOLN
23.0000 [IU] | Freq: Every day | SUBCUTANEOUS | Status: DC
  Filled 2019-02-20: qty 0.23

## 2019-02-20 MED ORDER — INSULIN ASPART 100 UNIT/ML ~~LOC~~ SOLN
10.0000 [IU] | Freq: Three times a day (TID) | SUBCUTANEOUS | Status: DC
  Administered 2019-02-21 – 2019-02-22 (×2): 10 [IU] via SUBCUTANEOUS

## 2019-02-20 MED ORDER — POTASSIUM CHLORIDE IN NACL 20-0.9 MEQ/L-% IV SOLN
INTRAVENOUS | Status: DC
  Administered 2019-02-20: 03:00:00 via INTRAVENOUS
  Filled 2019-02-20 (×2): qty 1000

## 2019-02-20 MED ORDER — INSULIN GLARGINE 100 UNIT/ML ~~LOC~~ SOLN
13.0000 [IU] | Freq: Once | SUBCUTANEOUS | Status: AC
  Administered 2019-02-20: 13 [IU] via SUBCUTANEOUS
  Filled 2019-02-20 (×2): qty 0.13

## 2019-02-20 MED ORDER — CAPSAICIN 0.025 % EX CREA
TOPICAL_CREAM | Freq: Two times a day (BID) | CUTANEOUS | Status: DC
  Administered 2019-02-21: 23:00:00 via TOPICAL
  Filled 2019-02-20: qty 60

## 2019-02-20 MED ORDER — KETOROLAC TROMETHAMINE 15 MG/ML IJ SOLN
INTRAMUSCULAR | Status: AC
  Filled 2019-02-20: qty 1

## 2019-02-20 MED ORDER — INSULIN ASPART 100 UNIT/ML ~~LOC~~ SOLN
0.0000 [IU] | Freq: Three times a day (TID) | SUBCUTANEOUS | Status: DC
  Administered 2019-02-20 – 2019-02-21 (×4): 3 [IU] via SUBCUTANEOUS
  Administered 2019-02-22: 2 [IU] via SUBCUTANEOUS
  Administered 2019-02-22: 7 [IU] via SUBCUTANEOUS

## 2019-02-20 MED ORDER — ENOXAPARIN SODIUM 40 MG/0.4ML ~~LOC~~ SOLN
40.0000 mg | SUBCUTANEOUS | Status: DC
  Filled 2019-02-20: qty 0.4

## 2019-02-20 MED ORDER — ONDANSETRON HCL 4 MG/2ML IJ SOLN
4.0000 mg | Freq: Four times a day (QID) | INTRAMUSCULAR | Status: DC | PRN
  Administered 2019-02-20 (×2): 4 mg via INTRAVENOUS
  Filled 2019-02-20 (×2): qty 2

## 2019-02-20 NOTE — Progress Notes (Signed)
Pt complaining of nausea and lower abdominal pain. She is crying the the room and drying heaving. States zofran ineffective. Provider paged. Awaiting orders.

## 2019-02-20 NOTE — Progress Notes (Signed)
Pt dry heaving during food delivery. Complains of headache and abd pain. Having menstral cramps and unable to keep down liquids/food. Notified MD.

## 2019-02-20 NOTE — ED Notes (Signed)
ED TO INPATIENT HANDOFF REPORT  ED Nurse Name and Phone #:  [phone]  S Name/Age/Gender Judy Judy [name] 18 y.o. female Room/Bed: 022C/022C  Code Status   Code Status: Full Code  Home/SNF/Other Home Patient oriented to: self, place, time and situation Is this baseline? Yes   Triage Complete: Triage complete  Chief Complaint Diabetic  Triage Note Pt c/o abdominal nausea/vomiting that started yesterday. Hx type 1 diabetes. A&O x 4.   Allergies No Known Allergies  Level of Care/Admitting Diagnosis ED Disposition    ED Disposition Condition Comment   Admit  Hospital Area: [facility] [[id number]]  Level of Care: Med-Surg [16]  Covid Evaluation: Asymptomatic Screening Protocol (No Symptoms)  Diagnosis: Hyperglycemia due to type 1 diabetes mellitus [facility]) [[id number]]  Admitting Physician: Judy Judy [name] [[id number]]  Attending Physician: Judy Judy [name] [[id number]]  PT Class (Do Not Modify): Observation [104]  PT Acc Code (Do Not Modify): Observation [10022]       B Medical/Surgery History Past Medical History:  Diagnosis Date  . Diabetes mellitus without complication (HCC)    History reviewed. No pertinent surgical history.   A IV Location/Drains/Wounds Patient Lines/Drains/Airways Status   Active Line/Drains/Airways    Name:   Placement date:   Placement time:   Site:   Days:   Peripheral IV 02/19/19 Anterior;Left;Proximal;Medial Forearm   02/19/19    1628    Forearm   1          Intake/Output Last 24 hours  Intake/Output Summary (Last 24 hours) at 02/20/2019 0136 Last data filed at 02/20/2019 0059 Gross per 24 hour  Intake 3025 ml  Output 100 ml  Net 2925 ml    Labs/Imaging Results for orders placed or performed during the hospital encounter of 02/19/19 (from the past 48 hour(s))  CBG monitoring, ED     Status: Abnormal   Collection Time: 02/19/19  5:07 AM  Result Value Ref Range   Glucose-Capillary 394 (H) 70 - 99 mg/dL   Urinalysis, Routine w reflex microscopic     Status: Abnormal   Collection Time: 02/19/19  5:17 AM  Result Value Ref Range   Color, Urine STRAW (A) YELLOW   APPearance CLEAR CLEAR   Specific Gravity, Urine 1.035 (H) 1.005 - 1.030   pH 7.0 5.0 - 8.0   Glucose, UA >=500 (A) NEGATIVE mg/dL   Hgb urine dipstick Judy [name] (A) NEGATIVE   Bilirubin Urine NEGATIVE NEGATIVE   Ketones, ur 20 (A) NEGATIVE mg/dL   Protein, ur NEGATIVE NEGATIVE mg/dL   Nitrite NEGATIVE NEGATIVE   Leukocytes,Ua NEGATIVE NEGATIVE   RBC / HPF 0-5 0 - 5 RBC/hpf   WBC, UA 0-5 0 - 5 WBC/hpf   Bacteria, UA NONE SEEN NONE SEEN   Squamous Epithelial / LPF 0-5 0 - 5    Comment: Performed at [facility], [address], [place], [place] [address]  I-Stat beta hCG blood, ED     Status: None   Collection Time: 02/19/19  5:40 AM  Result Value Ref Range   I-stat hCG, quantitative <5.0 <5 mIU/mL   Comment 3            Comment:   GEST. AGE      CONC.  (mIU/mL)   <=1 WEEK        5 - 50     2 WEEKS       50 - 500     3 WEEKS       100 -  10,000     4 WEEKS     1,000 - 30,000        FEMALE AND NON-PREGNANT FEMALE:     LESS THAN 5 mIU/mL   Lipase, blood     Status: None   Collection Time: 02/19/19  5:41 AM  Result Value Ref Range   Lipase 23 11 - 51 U/L    Comment: Performed at [facility], [address], [place], [place] [address]  Comprehensive metabolic panel     Status: Abnormal   Collection Time: 02/19/19  5:41 AM  Result Value Ref Range   Sodium 135 135 - 145 mmol/L   Potassium 3.2 (L) 3.5 - 5.1 mmol/L   Chloride 101 98 - 111 mmol/L   CO2 18 (L) 22 - 32 mmol/L   Glucose, Bld 417 (H) 70 - 99 mg/dL   BUN 10 6 - 20 mg/dL   Creatinine, Ser 5.28 0.44 - 1.00 mg/dL   Calcium 9.2 8.9 - 41.3 mg/dL   Total Protein 7.4 6.5 - 8.1 g/dL   Albumin 4.1 3.5 - 5.0 g/dL   AST 21 15 - 41 U/L   ALT 17 0 - 44 U/L   Alkaline Phosphatase 71 38 - 126 U/L   Total Bilirubin 0.5 0.3 - 1.2 mg/dL   GFR calc non Af Amer >60  >60 mL/min   GFR calc Af Amer >60 >60 mL/min   Anion gap 16 (H) 5 - 15    Comment: Performed at [facility], [address] [place], [place] [address]  CBC     Status: Abnormal   Collection Time: 02/19/19  5:41 AM  Result Value Ref Range   WBC 17.2 (H) 4.0 - 10.5 K/uL   RBC 4.55 3.87 - 5.11 MIL/uL   Hemoglobin 14.3 12.0 - 15.0 g/dL   HCT 02.7 25.3 - 66.4 %   MCV 92.3 80.0 - 100.0 fL   MCH 31.4 26.0 - 34.0 pg   MCHC 34.0 30.0 - 36.0 g/dL   RDW 40.3 47.4 - 25.9 %   Platelets 498 (H) 150 - 400 K/uL   nRBC 0.0 0.0 - 0.2 %    Comment: Performed at [facility], [address] [place], [place] [address]  Basic metabolic panel     Status: Abnormal   Collection Time: 02/19/19 12:52 PM  Result Value Ref Range   Sodium 137 135 - 145 mmol/L   Potassium 4.0 3.5 - 5.1 mmol/L   Chloride 103 98 - 111 mmol/L   CO2 19 (L) 22 - 32 mmol/L   Glucose, Bld 315 (H) 70 - 99 mg/dL   BUN 10 6 - 20 mg/dL   Creatinine, Ser 5.64 0.44 - 1.00 mg/dL   Calcium 9.8 8.9 - 33.2 mg/dL   GFR calc non Af Amer >60 >60 mL/min   GFR calc Af Amer >60 >60 mL/min   Anion gap 15 5 - 15    Comment: Performed at [facility], [address], [place], [place] [address]  Lactic acid, plasma     Status: Abnormal   Collection Time: 02/19/19 12:52 PM  Result Value Ref Range   Lactic Acid, Venous 3.0 (HH) 0.5 - 1.9 mmol/L    Comment: CRITICAL RESULT CALLED TO, READ BACK BY AND VERIFIED WITH: Marjory Lies RN [id number] [phone] BY A BENNETT Performed at [facility], [address] [place], [place] [address]   POCT I-Stat EG7  Status: Abnormal   Collection Time: 02/19/19  1:02 PM  Result Value Ref Range   pH, Ven 7.324 7.250 - 7.430   pCO2, Ven 36.6 (L) 44.0 - 60.0 mmHg   pO2, Ven 32.0 32.0 - 45.0 mmHg   Bicarbonate 19.0 (L) 20.0 - 28.0 mmol/L   TCO2 20 (L) 22 - 32 mmol/L   O2 Saturation 57.0 %   Acid-base deficit 6.0 (H) 0.0 - 2.0 mmol/L   Sodium 138 135 - 145 mmol/L   Potassium 4.0 3.5 - 5.1  mmol/L   Calcium, Ion 1.17 1.15 - 1.40 mmol/L   HCT 48.0 (H) 36.0 - 46.0 %   Hemoglobin 16.3 (H) 12.0 - 15.0 g/dL   Patient temperature HIDE    Sample type VENOUS    Comment NOTIFIED PHYSICIAN   Lactic acid, plasma     Status: Abnormal   Collection Time: 02/19/19  4:37 PM  Result Value Ref Range   Lactic Acid, Venous 3.1 (HH) 0.5 - 1.9 mmol/L    Comment: CRITICAL VALUE NOTED.  VALUE IS CONSISTENT WITH PREVIOUSLY REPORTED AND CALLED VALUE. Performed at [facility], [address] [place], [place] [address]   CBG monitoring, ED     Status: Abnormal   Collection Time: 02/19/19  7:24 PM  Result Value Ref Range   Glucose-Capillary 221 (H) 70 - 99 mg/dL  Basic metabolic panel     Status: Abnormal   Collection Time: 02/19/19 11:30 PM  Result Value Ref Range   Sodium 135 135 - 145 mmol/L   Potassium 4.2 3.5 - 5.1 mmol/L   Chloride 106 98 - 111 mmol/L   CO2 15 (L) 22 - 32 mmol/L   Glucose, Bld 266 (H) 70 - 99 mg/dL   BUN 10 6 - 20 mg/dL   Creatinine, Ser 6.040.79 0.44 - 1.00 mg/dL   Calcium 8.4 (L) 8.9 - 10.3 mg/dL   GFR calc non Af Amer >60 >60 mL/min   GFR calc Af Amer >60 >60 mL/min   Anion gap 14 5 - 15    Comment: Performed at [facility], [address] [place], [place] [address]  Lactic acid, plasma     Status: None   Collection Time: 02/19/19 11:30 PM  Result Value Ref Range   Lactic Acid, Venous 1.6 0.5 - 1.9 mmol/L    Comment: Performed at [facility], [address] [place], [place] [address]   No results found.  Pending Labs Unresulted Labs (From admission, onward)    Start     Ordered   02/20/19 0500  Basic metabolic panel  Tomorrow morning,   R     02/20/19 0026   02/20/19 0500  Hemoglobin A1c  Tomorrow morning,   R    Comments: To assess prior glycemic control    02/20/19 0026   02/20/19 0500  HIV Antibody (routine testing w rflx)  (HIV Antibody (Routine testing w reflex) panel)  Tomorrow morning,   R     02/20/19 0026   02/20/19 0500  CBC  with Differential/Platelet  Tomorrow morning,   R     02/20/19 0030   02/19/19 2238  Lactic acid, plasma  STAT Now then every 3 hours,   R (with STAT occurrences)     02/19/19 2237   02/19/19 2140  SARS CORONAVIRUS 2 (TAT 6-24 HRS) Nasopharyngeal Nasopharyngeal Swab  (Asymptomatic/Tier 2 Patients Labs)  Once,   STAT    Question Answer Comment  Is this  test for diagnosis or screening Screening   Symptomatic for COVID-19 as defined by CDC No   Hospitalized for COVID-19 No   Admitted to ICU for COVID-19 No   Previously tested for COVID-19 No   Resident in a congregate (group) care setting Unknown   Employed in healthcare setting Unknown   Pregnant Unknown      02/19/19 2139          Vitals/Pain Today's Vitals   02/19/19 2325 02/19/19 2326 02/19/19 2327 02/20/19 0100  BP:  139/85 139/85 128/89  Pulse:  81 81 90  Resp:   16   Temp:   98.4 F (36.9 C)   TempSrc:   Oral   SpO2:  100% 100% 100%  PainSc: 0-No pain       Isolation Precautions No active isolations  Medications Medications  insulin glargine (LANTUS) injection 23 Units (has no administration in time range)  enoxaparin (LOVENOX) injection 40 mg (has no administration in time range)  0.9 % NaCl with KCl 20 mEq/ L  infusion (has no administration in time range)  acetaminophen (TYLENOL) tablet 650 mg (has no administration in time range)    Or  acetaminophen (TYLENOL) suppository 650 mg (has no administration in time range)  ondansetron (ZOFRAN) tablet 4 mg (has no administration in time range)    Or  ondansetron (ZOFRAN) injection 4 mg (has no administration in time range)  insulin aspart (novoLOG) injection 0-9 Units (has no administration in time range)  insulin aspart (novoLOG) injection 10 Units (has no administration in time range)  sodium chloride flush (NS) 0.9 % injection 3 mL (3 mLs Intravenous Given 02/19/19 1251)  sodium chloride 0.9 % bolus 1,000 mL (0 mLs Intravenous Stopped 02/19/19 1745)   ondansetron (ZOFRAN) injection 4 mg (4 mg Intravenous Given 02/19/19 1306)  potassium chloride 10 mEq in 100 mL IVPB (0 mEq Intravenous Stopped 02/19/19 1400)  sodium chloride 0.9 % bolus 1,000 mL (0 mLs Intravenous Stopped 02/19/19 1926)  ketorolac (TORADOL) 30 MG/ML injection 30 mg (30 mg Intravenous Given 02/19/19 1645)  insulin aspart (novoLOG) injection 10 Units (10 Units Intravenous Given 02/19/19 1644)  sodium chloride 0.9 % bolus 1,000 mL (0 mLs Intravenous Stopped 02/20/19 0059)  morphine 4 MG/ML injection 4 mg (4 mg Intravenous Given 02/19/19 2155)    Mobility walks Low fall risk   Focused Assessments Cardiac Assessment Handoff:    No results found for: CKTOTAL, CKMB, CKMBINDEX, TROPONINI No results found for: DDIMER Does the Patient currently have chest pain? No      R Recommendations: See Admitting Provider Note  Report given to:   Additional Notes:

## 2019-02-20 NOTE — Progress Notes (Signed)
Family Medicine Teaching Service Daily Progress Note Intern Pager: [phone]  Patient name: Judy [name] Medical record number: [id number] Date of birth: 09-12-2000 Age: 18 y.o. Gender: female  Primary Care Provider: System, Pcp Not In Consultants: None Code Status: Full  Pt Overview and Major Events to Date:  10/28-admission  Assessment and Plan: Judy [name] is a 18 y.o. female presenting with abdominal pain and emesis for 1 day. PMH is significant for T1DM.   Type I DM-lactic acidosis, hyperglycemia and emesis Vital signs stable overnight.  Patient reports  NBNB emesis x1. Lactic acid on admission 3.1 now 1.2.  No elevated anion gap.  Serum glucose 276.  Hemoglobin A1c 10.8.  No Lantus given last night.  Required 3 units NovoLog today.  On exam patient well-appearing.  Mucous membranes are moist.  Denies any chest pain, shortness of breath.  Reports abdominal cramping but relates it to menstrual cycle.  Beta hCG negative -Continue IV normal saline at 100 an hour -Oral hydration -Zofran as needed -Tylenol as needed -Lantus 23 units nightly -NovoLog 10 units with meals daily -Vitals per unit recall -BMP in a.m.  Hypokalemia Resolved.  Potassium 4.0 this morning -BMP in a.m.  Leukocytosis Patient afebrile.  WBC elevated at 19.5.  With ANC 16.7.   -Continue to monitor for any signs of infection. -Monitor fever curve -Repeat CBC in a.m.   FEN/GI: Soft diet to advance as tolerated Prophylaxis: Lovenox 40mg   Disposition: Anticipate afternoon discharge or early morning  Subjective:  No acute events overnight.  Denies any chest pain, shortness of breath.  Reports one nonbloody nonbile emesis this morning.  States starting to increase.  Still reports abdominal cramping but relates it to having started menstrual cycle.  Objective: Temp:  [97.9 F (36.6 C)-99.2 F (37.3 C)] 98.3 F (36.8 C) (10/29 0520) Pulse Rate:  [36-99] 84 (10/29 0520) Resp:  [14-18]  14 (10/29 0520) BP: (122-143)/(76-99) 143/92 (10/29 0520) SpO2:  [95 %-100 %] 100 % (10/29 0520) Weight:  [54.9 kg] 54.9 kg (10/29 0220) Physical Exam: General: 18 year old female, in no acute distress Cardiovascular: Regular rate and rhythm, no murmurs appreciated Respiratory: Chest clear to auscultation bilaterally, no crackles, no rhonchi, no increased work of breathing Abdomen: Soft, nontender, nondistended, bowel sounds present, no organomegaly Extremities: BL extremities, no lower extremity edema.  Laboratory: Recent Labs  Lab 02/19/19 0541 02/19/19 1302 02/20/19 0252  WBC 17.2*  --  19.5*  HGB 14.3 16.3* 12.6  HCT 42.0 48.0* 37.1  PLT 498*  --  366   Recent Labs  Lab 02/19/19 0541 02/19/19 1252 02/19/19 1302 02/19/19 2330 02/20/19 0252  NA 135 137 138 135 134*  K 3.2* 4.0 4.0 4.2 4.0  CL 101 103  --  106 107  CO2 18* 19*  --  15* 15*  BUN 10 10  --  10 9  CREATININE 1.00 0.76  --  0.79 0.71  CALCIUM 9.2 9.8  --  8.4* 8.3*  PROT 7.4  --   --   --   --   BILITOT 0.5  --   --   --   --   ALKPHOS 71  --   --   --   --   ALT 17  --   --   --   --   AST 21  --   --   --   --   GLUCOSE 417* 315*  --  266* 276*      Imaging/Diagnostic Tests:  Dana Allan, MD 02/20/2019, 1:13 PM PGY-1, [facility] Family Medicine FPTS Intern pager: [phone], text pages welcome

## 2019-02-20 NOTE — Plan of Care (Signed)

## 2019-02-20 NOTE — Progress Notes (Addendum)
FPTS Interim Progress Note  S: Received page stating that patient had tried to eat chicken broth and became increasingly more nauseous with dry heaving. Reported to bedside in order to examine patient. Ms. Ceasar Mons states that she is having abdominal pain in the lower parts of her abdomen and dry heaving. She adds that the phenergan she received while in the ED helped to relieve her nausea up until earlier this evening. Patient reports that she was able to drink fluids this afternoon but has recently not been able to tolerate anything PO. When asked about whether or not the toradol helped with her pain, she states no and that morphine helped more.   O: BP 125/86 (BP Location: Right Arm)   Pulse 80   Temp 97.9 F (36.6 C) (Axillary)   Resp 15   Ht 5' (1.524 m)   Wt 54.9 kg   LMP 02/18/2019   SpO2 100%   BMI 23.64 kg/m    Physical Exam  Gen: uncomfortable appearing female lying in bed  Abdomen: tenderness in lower quadrants of abdomen, occasional bowel sounds present, abdomen is soft with no guarding   A/P: -patient declines capsacin cream  -ordered phenergan 25mg  IV to help with nausea  -continue mIVFs -discontinued Zofran   Stark Klein, MD 02/20/2019, 10:10 PM PGY-1, [facility] Service pager [phone]

## 2019-02-20 NOTE — Progress Notes (Signed)
Pt dry heaving again, complains of abd pain. Notified md.

## 2019-02-21 LAB — CBC WITH DIFFERENTIAL/PLATELET
Abs Immature Granulocytes: 0.07 10*3/uL (ref 0.00–0.07)
Basophils Absolute: 0 10*3/uL (ref 0.0–0.1)
Basophils Relative: 0 %
Eosinophils Absolute: 0 10*3/uL (ref 0.0–0.5)
Eosinophils Relative: 0 %
HCT: 38.5 % (ref 36.0–46.0)
Hemoglobin: 13.1 g/dL (ref 12.0–15.0)
Immature Granulocytes: 1 %
Lymphocytes Relative: 9 %
Lymphs Abs: 1.2 10*3/uL (ref 0.7–4.0)
MCH: 30.9 pg (ref 26.0–34.0)
MCHC: 34 g/dL (ref 30.0–36.0)
MCV: 90.8 fL (ref 80.0–100.0)
Monocytes Absolute: 0.9 10*3/uL (ref 0.1–1.0)
Monocytes Relative: 7 %
Neutro Abs: 11.1 10*3/uL — ABNORMAL HIGH (ref 1.7–7.7)
Neutrophils Relative %: 83 %
Platelets: 380 10*3/uL (ref 150–400)
RBC: 4.24 MIL/uL (ref 3.87–5.11)
RDW: 13.2 % (ref 11.5–15.5)
WBC: 13.3 10*3/uL — ABNORMAL HIGH (ref 4.0–10.5)
nRBC: 0 % (ref 0.0–0.2)

## 2019-02-21 LAB — COMPREHENSIVE METABOLIC PANEL
ALT: 16 U/L (ref 0–44)
AST: 14 U/L — ABNORMAL LOW (ref 15–41)
Albumin: 3.4 g/dL — ABNORMAL LOW (ref 3.5–5.0)
Alkaline Phosphatase: 67 U/L (ref 38–126)
Anion gap: 12 (ref 5–15)
BUN: 9 mg/dL (ref 6–20)
CO2: 17 mmol/L — ABNORMAL LOW (ref 22–32)
Calcium: 8.6 mg/dL — ABNORMAL LOW (ref 8.9–10.3)
Chloride: 107 mmol/L (ref 98–111)
Creatinine, Ser: 0.77 mg/dL (ref 0.44–1.00)
GFR calc Af Amer: >60 mL/min (ref >60)
GFR calc non Af Amer: >60 mL/min (ref >60)
Glucose, Bld: 245 mg/dL — ABNORMAL HIGH (ref 70–99)
Potassium: 4.3 mmol/L (ref 3.5–5.1)
Sodium: 136 mmol/L (ref 135–145)
Total Bilirubin: 0.8 mg/dL (ref 0.3–1.2)
Total Protein: 6.5 g/dL (ref 6.5–8.1)

## 2019-02-21 LAB — GLUCOSE, CAPILLARY
Glucose-Capillary: 240 mg/dL — ABNORMAL HIGH (ref 70–99)
Glucose-Capillary: 76 mg/dL (ref 70–99)
Glucose-Capillary: 98 mg/dL (ref 70–99)
Glucose-Capillary: 107 mg/dL — ABNORMAL HIGH (ref 70–99)

## 2019-02-21 MED ORDER — PROMETHAZINE HCL 25 MG/ML IJ SOLN
25.0000 mg | Freq: Four times a day (QID) | INTRAMUSCULAR | Status: DC
  Administered 2019-02-21 – 2019-02-22 (×4): 25 mg via INTRAVENOUS
  Filled 2019-02-21 (×4): qty 1

## 2019-02-21 MED ORDER — KETOROLAC TROMETHAMINE 15 MG/ML IJ SOLN
30.0000 mg | Freq: Four times a day (QID) | INTRAMUSCULAR | Status: DC
  Administered 2019-02-21 – 2019-02-22 (×4): 30 mg via INTRAVENOUS
  Filled 2019-02-21 (×5): qty 2

## 2019-02-21 MED ORDER — OXYCODONE HCL 5 MG PO TABS
5.0000 mg | ORAL_TABLET | ORAL | Status: DC | PRN
  Administered 2019-02-21 (×2): 5 mg via ORAL
  Filled 2019-02-21 (×2): qty 1

## 2019-02-21 MED ORDER — INSULIN GLARGINE 100 UNIT/ML ~~LOC~~ SOLN
11.0000 [IU] | Freq: Every day | SUBCUTANEOUS | Status: DC
  Filled 2019-02-21 (×2): qty 0.11

## 2019-02-21 MED ORDER — KETOROLAC TROMETHAMINE 15 MG/ML IJ SOLN: 15.0000 mg | Freq: Four times a day (QID) | INTRAMUSCULAR | Status: DC

## 2019-02-21 NOTE — Progress Notes (Signed)
Family Medicine Teaching Service Daily Progress Note Intern Pager: [phone]  Patient name: Judy Cook Medical record number: [id number] Date of birth: Jan 10, 2001 Age: 18 y.o. Gender: female  Primary Care Provider: System, Pcp Not In Consultants: None Code Status: Full  Pt Overview and Major Events to Date:  10/28-admission, mIVFs, Zofran  10/29- switched to Phenergan   Assessment and Plan: Judy Cook is a 18 y.o. female presenting with abdominal pain and emesis for 1 day. PMH is significant for T1DM.   Type I DM- lactic acidosis, hyperglycemia and emesis Vital signs stable overnight.  Patient Afebrile.  Required 13 units of insulin coverage in the last 24 hours. Patient had poor PO intake yesterday and last night so Lantus was decreased from 23 units to 11 units with CBG ranging 98-122. Patient reports having 1 episode of emesis and abdominal cramping. On exam, reduced tenderness to palpation of her abdomen. Patient more comfortable appearing. Patient reports that she has not never been on oral contraception and would like to try it to help with menstrual cramps.  -recommend prescribing OCP at discharge  -discontinue IV normal saline at 100 an hour -Encourage oral hydration when able to tolerate p.o. -transition to PO Phenergan q 6 hours PRN -Lantus decreased to 11 units overnight for lower CBG of 107, will increase to home dose as patient increases PO intake  - NovoLog 10 units with meals when able to tolerate meals -Vital signs per unit -Oxycodone IR 5 mg every 4 as needed  Leukocytosis, resolved Patient continues to be afebrile.  FEN/GI: continue soft diet, will advance as tolerated   Prophylaxis: Lovenox 40 (patient appears to be refusing)  Disposition: Anticipate discharge later this afternoon with successful toleration of PO fluids and adequate pain control   Subjective:  Patient reports she has been able to tolerate PO fluids this morning and had  some oatmeal for breakfast with no emesis this morning. She reports 1 episode in the earlier part of the evening last night. Patient reports feeling improved abdominal pain after Toradol and PRN Oxycodone.  Patient adds that she would like to be started on oral contraception if it would help with her menstrual cycle. Multiple options offered to patient and discussed potential side effects. Patient requesting to have glucometer prescribed after discharge as well as testing supplies. Patient asks if she can be discharged today. We discussed she would need to be able to tolerate only PO fluids with controlled pain levels prior to discharge. Patient was agreeable with this plan.   Objective: Temp:  [98.2 F (36.8 C)-99.2 F (37.3 C)] 98.2 F (36.8 C) (10/31 0635) Pulse Rate:  [76-109] 90 (10/31 0635) Resp:  [16] 16 (10/31 0635) BP: (120-138)/(83-99) 129/95 (10/31 0635) SpO2:  [100 %] 100 % (10/31 5784)  Physical Exam: General: female appearing stated age in NAD,lying in bed watching television  Cardiovascular: RRR without murmurs, gallops or friction rubs  Respiratory: CTAB without wheezing, crackles, stable on RA, normal WOB  Abdomen: soft, minimal tenderness to palpation, low pitched/occasional bowel sounds  Extremities: no edema in LE   Laboratory: Recent Labs  Lab 02/19/19 0541 02/19/19 1302 02/20/19 0252 02/21/19 0533  WBC 17.2*  --  19.5* 13.3*  HGB 14.3 16.3* 12.6 13.1  HCT 42.0 48.0* 37.1 38.5  PLT 498*  --  366 380   Recent Labs  Lab 02/19/19 0541  02/19/19 2330 02/20/19 0252 02/21/19 0533  NA 135   < > 135 134* 136  K 3.2*   < >  4.2 4.0 4.3  CL 101   < > 106 107 107  CO2 18*   < > 15* 15* 17*  BUN 10   < > 10 9 9   CREATININE 1.00   < > 0.79 0.71 0.77  CALCIUM 9.2   < > 8.4* 8.3* 8.6*  PROT 7.4  --   --   --  6.5  BILITOT 0.5  --   --   --  0.8  ALKPHOS 71  --   --   --  67  ALT 17  --   --   --  16  AST 21  --   --   --  14*  GLUCOSE 417*   < > 266* 276* 245*    < > = values in this interval not displayed.    Imaging/Diagnostic Tests: No new imaging  , MD 02/22/2019, 9:35 AM PGY-1, [facility] Family Medicine FPTS Intern pager: [phone], text pages welcome

## 2019-02-21 NOTE — Progress Notes (Signed)
Family Medicine Teaching Service Daily Progress Note Intern Pager: [phone]  Patient name: Judy Cook Medical record number: [id number] Date of birth: 2000/06/10 Age: 18 y.o. Gender: female  Primary Care Provider: System, Pcp Not In Consultants: None Code Status: Full  Pt Overview and Major Events to Date:  10/28-admission  Assessment and Plan: Judy Cook is a 18 y.o. female presenting with abdominal pain and emesis for 1 day. PMH is significant for T1DM.   Type I DM-lactic acidosis, hyperglycemia and emesis Vital signs stable overnight.  Afebrile.  Required 9 units of insulin coverage yesterday.  Continues to have periods of emesis and abdominal cramping.  Required 2 doses Phenergan and Toradol overnight. On exam -Continue IV normal saline at 100 an hour -Encourage oral hydration when able to tolerate p.o. -Phenergan IV every 6 hours -Continue Lantus 23 units nightly -NovoLog 10 units with meals when able to tolerate meals -Sensitive sliding scale insulin coverage -Vital signs per unit -BMP in a.m.  -Ketoralac 30mg  IV q6h -Oxycodone IR 5 mg every 4 as needed  Leukocytosis Afebrile.  Leukocytosis improving.  WBC 76.1 with neutrophilic shift, decreased from 19.5 on admission -Continue to monitor for any signs of infection -CBC in a.m.  FEN/GI:  -Soft diet to advance as tolerated  -IV normal saline at 100 cc an hour Prophylaxis: -Lovenox 40 (patient appears to be refusing)  Disposition: Anticipate discharge 1 to 2 days.  Subjective:  No acute events overnight.  Denies any chest pain, shortness of breath.  3-4 nonbloody nonbilious emesis overnight.  Continues to have abdominal cramping.  Poor p.o. intake  Objective: Temp:  [97.6 F (36.4 C)-98.5 F (36.9 C)] 98.1 F (36.7 C) (10/30 0624) Pulse Rate:  [72-96] 72 (10/30 0624) Resp:  [14-17] 17 (10/30 0624) BP: (125-149)/(82-95) 129/89 (10/30 0624) SpO2:  [100 %] 100 % (10/30 6073) Physical  Exam: General: 18 year old female in no acute distress Cardiovascular: Rate and rhythm, no murmurs appreciated Respiratory: Chest clear to auscultation bilaterally no crackles, no rhonchi, no increased work of regular Abdomen: Soft, nondistended, mild tenderness on palpation lower abdomen, bowel sounds present Extremities: Moving extremities, no lower extremity edema.  Laboratory: Recent Labs  Lab 02/19/19 0541 02/19/19 1302 02/20/19 0252 02/21/19 0533  WBC 17.2*  --  19.5* 13.3*  HGB 14.3 16.3* 12.6 13.1  HCT 42.0 48.0* 37.1 38.5  PLT 498*  --  366 380   Recent Labs  Lab 02/19/19 0541 02/19/19 1252 02/19/19 1302 02/19/19 2330 02/20/19 0252  NA 135 137 138 135 134*  K 3.2* 4.0 4.0 4.2 4.0  CL 101 103  --  106 107  CO2 18* 19*  --  15* 15*  BUN 10 10  --  10 9  CREATININE 1.00 0.76  --  0.79 0.71  CALCIUM 9.2 9.8  --  8.4* 8.3*  PROT 7.4  --   --   --   --   BILITOT 0.5  --   --   --   --   ALKPHOS 71  --   --   --   --   ALT 17  --   --   --   --   AST 21  --   --   --   --   GLUCOSE 417* 315*  --  266* 276*      Imaging/Diagnostic Tests:   Carollee Leitz, MD 02/21/2019, 6:34 AM PGY-1, [place] Intern pager: [phone], text pages welcome

## 2019-02-21 NOTE — TOC Initial Note (Addendum)
Transition of Care [facility]) - Initial/Assessment Note    Patient Details  Name: Judy Cook MRN: [id number] Date of Birth: 2001-03-08  Transition of Care [facility]) CM/SW Contact:    Marilu Favre, RN Phone Number: 02/21/2019, 11:29 AM  Clinical Narrative:                 Spoke to patient at bedside.Patient plans to discharge to address in Epic. Patient lives with a friend.  Patient unsure of PCP name. When asked location/name of practice patient provided [place], [place] [phone]. Called to schedule appointment. Was told patient has not established care with them , however, scheduled appointment with Dr Gaetana Michaelis for Friday February 28, 2019 at @:30 pm. Receptionist saw where patient's endocrinologist is at [facility] [place] [phone] called and left voicemail.  Patient has insurance and can afford co pays. She is able to blood glucose machine at her pharmacy, has transportation to appointments and home.   Update went back to discuss above with patient.  Patient is now staying in [place] with a "friend" , patient did not provide friends name , phone number or address. Patient requesting follow up appointment with Dr Hilton Cork to be cancelled ( done). Patient will call Dr Volanda Napoleon practice to schedule her follow up appointment.   Expected Discharge Plan: Home/Self Care Barriers to Discharge: Continued Medical Work up   Patient Goals and CMS Choice Patient states their goals for this hospitalization and ongoing recovery are:: to go home CMS Medicare.gov Compare Post Acute Care list provided to:: Patient Choice offered to / list presented to : NA  Expected Discharge Plan and Services Expected Discharge Plan: Home/Self Care   Discharge Planning Services: CM Consult   Living arrangements for the past 2 months: Single Family Home                 DME Arranged: N/A         HH Arranged: NA           Prior Living Arrangements/Services Living arrangements for the past 2 months: Single Family Home Lives with:: Friends Patient language and need for interpreter reviewed:: Yes Do you feel safe going back to the place where you live?: Yes      Need for Family Participation in Patient Care: Yes (Comment) Care giver support system in place?: Yes (comment)   Criminal Activity/Legal Involvement Pertinent to Current Situation/Hospitalization: No - Comment as needed  Activities of Daily Living Home Assistive Devices/Equipment: CBG Meter ADL Screening (condition at time of admission) Patient's cognitive ability adequate to safely complete daily activities?: Yes Is the patient deaf or have difficulty hearing?: No Does the patient have difficulty seeing, even when wearing glasses/contacts?: No Does the patient have difficulty concentrating, remembering, or making decisions?: No Patient able to express need for assistance with ADLs?: Yes Does the patient have difficulty dressing or bathing?: No Independently performs ADLs?: Yes (appropriate for developmental age) Does the patient have difficulty walking or climbing stairs?: No Weakness of Legs: None Weakness of Arms/Hands: None  Permission Sought/Granted Permission sought to share information with : PCP       Permission granted to share info w AGENCY: [facility]        Emotional Assessment Appearance:: Appears stated age Attitude/Demeanor/Rapport: Avoidant Affect (typically observed): Apprehensive Orientation: : Oriented to Self, Oriented to Place, Oriented to  Time, Oriented to Situation Alcohol / Substance  Use: Not Applicable Psych Involvement: No (comment)  Admission diagnosis:  Dehydration [E86.0] Hypokalemia [E87.6] Hyperglycemia [R73.9] Intractable vomiting with nausea, unspecified vomiting type [R11.2] Patient Active Problem List   Diagnosis Date Noted  . Dysmenorrhea  02/20/2019  . Marijuana use 02/20/2019  . Cyclical vomiting 02/20/2019  . Hypokalemia 02/20/2019  . Hyperglycemia due to type 1 diabetes mellitus (HCC) 02/19/2019  . Type 1 diabetes mellitus without complication (HCC) 02/26/2012   PCP:  System, Pcp Not In Pharmacy:   [id number] - [place], [place] - [address] AT [facility] [address] [place] [address] Phone: [phone] Fax: [phone]     Social Determinants of Health (SDOH) Interventions    Readmission Risk Interventions No flowsheet data found.

## 2019-02-21 NOTE — Discharge Summary (Addendum)
[facility] Discharge Summary  Patient name: Judy Cook Medical record number: [id number] Date of birth: September 24, 2000 Age: 18 y.o. Gender: female Date of Admission: 02/19/2019  Date of Discharge: 02/22/2019 Admitting Physician: Blane Ohara McDiarmid, MD  Primary Care Provider: System, Pcp Not In Consultants: None  Indication for Hospitalization: Abdominal pain and emesis  Discharge Diagnoses/Problem List:  Principal Problem:   Cyclical vomiting Active Problems:   Hyperglycemia due to type 1 diabetes mellitus ([place])   Dysmenorrhea   Marijuana use   Type 1 diabetes mellitus without complication (HCC)   Hypokalemia   Hyperglycemia   Intractable vomiting  Disposition: Home  Discharge Condition: Stable  Discharge Exam:  General: female appearing stated age in NAD,lying in bed watching television  Cardiovascular: RRR without murmurs, gallops or friction rubs  Respiratory: CTAB without wheezing, crackles, stable on RA, normal WOB  Abdomen: soft, minimal tenderness to palpation, low pitched/occasional bowel sounds  Extremities: no edema in LE   Brief Hospital Course:   T1DM with Hyperglycemia, Emesis and Inability to tolerate PO  Judy Cook is a 18 y.o. female with PMH significant for T1DM who presents with 1 day history of abdominal pain and emesis. Patient reports that the onset of her menses often is accompanied by these symptoms and inability to maintain PO hydration. Patient was treated with mIVFs, Zofran (later switched to Phenergan), oxycodone and Toradol for her abdominal cramps. After two days, patient's pain improved and she was able to tolerate PO. Patient was deemed stable for discharge and arranged for outpatient follow up. Patient stated that she would need glucose testing supplies at time of discharge and also expressed interest in being started on OCP.   Issues for Follow Up:  1. Verify patient has glucose monitoring kit,  lancets and strips. Will need prescription for home blood glucose meter kit (order #[id number]) at discharge 2. Recommend that patient prescribe OCP to assist with menstrual pain and onset of emesis.  3. Recommend that patient have follow up with her endocrinologist as outpatient, patient expresses interest in being started on continuous glucose monitor.   Significant Procedures: none   Significant Labs and Imaging:  Recent Labs  Lab 02/19/19 0541 02/19/19 1302 02/20/19 0252 02/21/19 0533  WBC 17.2*  --  19.5* 13.3*  HGB 14.3 16.3* 12.6 13.1  HCT 42.0 48.0* 37.1 38.5  PLT 498*  --  366 380   Recent Labs  Lab 02/19/19 0541 02/19/19 1252 02/19/19 1302 02/19/19 2330 02/20/19 0252 02/21/19 0533  NA 135 137 138 135 134* 136  K 3.2* 4.0 4.0 4.2 4.0 4.3  CL 101 103  --  106 107 107  CO2 18* 19*  --  15* 15* 17*  GLUCOSE 417* 315*  --  266* 276* 245*  BUN 10 10  --  _0 CREATININE 1.00 0.76  --  0.79 0.71 0.77  CALCIUM 9.2 9.8  --  8.4* 8.3* 8.6*  ALKPHOS 71  --   --   --   --  67  AST 21  --   --   --   --  14*  ALT 17  --   --   --   --  16  ALBUMIN 4.1  --   --   --   --  3.4*    Results/Tests Pending at Time of Discharge: None  Discharge Medications:  Allergies as of 02/22/2019   No Known Allergies     Medication List  TAKE these medications   blood glucose meter kit and supplies Dispense based on patient and insurance preference. Use up to four times daily as directed. (FOR ICD-10 E10.9, E11.9).   Lantus SoloStar 100 UNIT/ML Solostar Pen Generic drug: Insulin Glargine Inject 23 Units into the skin at bedtime.   multivitamin with minerals tablet Take 1 tablet by mouth daily.   naproxen 500 MG tablet Commonly known as: NAPROSYN Take 500 mg by mouth 2 (two) times daily.   NovoLOG FlexPen 100 UNIT/ML FlexPen Generic drug: insulin aspart Inject 12 Units into the skin 3 (three) times daily before meals. Ratio/sliding scale ( Adjust carb)   oxyCODONE 5 MG  immediate release tablet Commonly known as: Oxy IR/ROXICODONE Take 1 tablet (5 mg total) by mouth every 4 (four) hours as needed for severe pain.   promethazine 25 MG tablet Commonly known as: PHENERGAN Take 1 tablet (25 mg total) by mouth every 6 (six) hours as needed for nausea or vomiting.   promethazine 25 MG suppository Commonly known as: Phenergan Place 1 suppository (25 mg total) rectally every 6 (six) hours as needed for nausea.       Discharge Instructions: Please refer to Patient Instructions section of EMR for full details.  Patient was counseled important signs and symptoms that should prompt return to medical care, changes in medications, dietary instructions, activity restrictions, and follow up appointments.   Follow-Up Appointments: Follow-up Information    Carollee Leitz, MD Follow up.   Specialty: Family Medicine Contact information: [address] [address] [place], [place], DO 02/22/2019, 2:28 PM PGY-1, [place] Upper-Level Resident Addendum I have independently interviewed and examined the patient. I have discussed the above with the original author and agree with their documentation.   Matilde Haymaker MD PGY-2, [facility] 02/22/2019 4:07 PM  [place] Service pager: [phone] (text pages welcome through [facility])

## 2019-02-21 NOTE — Progress Notes (Addendum)
Spoke with patient on the phone. Patient speaks very fluent [place]. Was diagnosed with diabetes at the age of 68. Has an endocrinologist in [place], [place]. Patient last saw the physician in July. No changes to her insulin regimen made at that time. Patient states that she takes Lantus 23 units every HS and Novolog 12-13 units TID depending on her blood sugars. She has been able to get her insulin recently, but has had a time when she was  unable to get it. She would not expand on that issue. Patient does not live alone. Has never been in DKA before...states that she thinks it came from not being able to eat. Patient has not been feeling well and therefore, not very talkative. Mentioned HgbA1C of 10.8% and the significance of it compared to blood sugars.   Patient states that she does need a new home blood glucose meter, strips, and lancets. Will need prescription for home blood glucose meter kit (order #[id number]) at discharge. Patient states that the plan is for her to return to her home in [place] at discharge.   Harvel Ricks RN BSN CDE Diabetes Coordinator Pager: [phone]  8am-5pm

## 2019-02-22 DIAGNOSIS — R739 Hyperglycemia, unspecified: Secondary | ICD-10-CM

## 2019-02-22 DIAGNOSIS — R1110 Vomiting, unspecified: Secondary | ICD-10-CM

## 2019-02-22 DIAGNOSIS — N946 Dysmenorrhea, unspecified: Secondary | ICD-10-CM

## 2019-02-22 DIAGNOSIS — R1115 Cyclical vomiting syndrome unrelated to migraine: Secondary | ICD-10-CM

## 2019-02-22 LAB — GLUCOSE, CAPILLARY
Glucose-Capillary: 122 mg/dL — ABNORMAL HIGH (ref 70–99)
Glucose-Capillary: 303 mg/dL — ABNORMAL HIGH (ref 70–99)

## 2019-02-22 MED ORDER — PROMETHAZINE HCL 25 MG PO TABS: 25.0000 mg | ORAL_TABLET | Freq: Four times a day (QID) | ORAL | Status: DC | PRN

## 2019-02-22 MED ORDER — OXYCODONE HCL 5 MG PO TABS: 5.0000 mg | ORAL_TABLET | ORAL | 0 refills | Status: DC | PRN

## 2019-02-22 MED ORDER — SODIUM CHLORIDE 0.9% FLUSH: 3.0000 mL | INTRAVENOUS | Status: DC | PRN

## 2019-02-22 MED ORDER — PROMETHAZINE HCL 25 MG RE SUPP: 25.0000 mg | Freq: Four times a day (QID) | RECTAL | 0 refills | Status: DC | PRN

## 2019-02-22 MED ORDER — SODIUM CHLORIDE 0.9 % IV SOLN: 250.0000 mL | INTRAVENOUS | Status: DC | PRN

## 2019-02-22 MED ORDER — SODIUM CHLORIDE 0.9% FLUSH: 3.0000 mL | Freq: Two times a day (BID) | INTRAVENOUS | Status: DC

## 2019-02-22 MED ORDER — PROMETHAZINE HCL 25 MG PO TABS: 25.0000 mg | ORAL_TABLET | Freq: Four times a day (QID) | ORAL | 0 refills | Status: DC | PRN

## 2019-02-22 MED ORDER — BLOOD GLUCOSE METER KIT: PACK | 0 refills | Status: DC

## 2019-02-22 NOTE — Discharge Instructions (Signed)
Thank you for allowing [place] to take part in your care. You were admitted to the hospital due to inability to tolerate fluids by mouth and concern for dehydration in the setting of Type 1 diabetes. While here, you were treated with Phenergan, Oxycodone and Toradol.   Please continue to take Naproxen to help with pain due to menstrual cramping as prescribed over the counter.   We have prescribed you to take Phenergan in the event you are unable to stop vomiting and can not tolerate fluids by mouth. In the event, that you cannot take this by mouth, we have prescribed a suppository of this medication to be placed rectally as an abortive therapy for intractable vomiting.   We have also prescribed oxycodone in the event that you are unable to control the abdominal pain with onset of vomiting. Please only use this in case of extreme pain that is unresponsive to over the counter NSAIDs (for example, Aleeve, ibuprofen, Advil, Naproxen)  Please discuss beginning an oral contraceptive pill with your primary care provider during your hospital follow up appointment.

## 2019-07-15 DIAGNOSIS — E109 Type 1 diabetes mellitus without complications: Secondary | ICD-10-CM | POA: Diagnosis not present

## 2019-12-02 ENCOUNTER — Other Ambulatory Visit: Payer: Self-pay

## 2019-12-02 ENCOUNTER — Inpatient Hospital Stay (HOSPITAL_COMMUNITY)
Admission: EM | Admit: 2019-12-02 | Discharge: 2019-12-08 | DRG: 638 | Disposition: A | Discharge status: Home / Self Care | Payer: Federal, State, Local not specified - PPO | Attending: Internal Medicine | Admitting: Internal Medicine

## 2019-12-02 ENCOUNTER — Encounter (HOSPITAL_COMMUNITY): Payer: Self-pay | Admitting: Emergency Medicine

## 2019-12-02 DIAGNOSIS — Z915 Personal history of self-harm: Secondary | ICD-10-CM | POA: Diagnosis not present

## 2019-12-02 DIAGNOSIS — Z79899 Other long term (current) drug therapy: Secondary | ICD-10-CM

## 2019-12-02 DIAGNOSIS — R1084 Generalized abdominal pain: Secondary | ICD-10-CM | POA: Diagnosis not present

## 2019-12-02 DIAGNOSIS — R6510 Systemic inflammatory response syndrome (SIRS) of non-infectious origin without acute organ dysfunction: Secondary | ICD-10-CM | POA: Diagnosis not present

## 2019-12-02 DIAGNOSIS — E1110 Type 2 diabetes mellitus with ketoacidosis without coma: Secondary | ICD-10-CM | POA: Diagnosis present

## 2019-12-02 DIAGNOSIS — E860 Dehydration: Secondary | ICD-10-CM | POA: Diagnosis not present

## 2019-12-02 DIAGNOSIS — Z9114 Patient's other noncompliance with medication regimen: Secondary | ICD-10-CM

## 2019-12-02 DIAGNOSIS — Z794 Long term (current) use of insulin: Secondary | ICD-10-CM | POA: Diagnosis not present

## 2019-12-02 DIAGNOSIS — R739 Hyperglycemia, unspecified: Secondary | ICD-10-CM | POA: Diagnosis not present

## 2019-12-02 DIAGNOSIS — Z833 Family history of diabetes mellitus: Secondary | ICD-10-CM | POA: Diagnosis not present

## 2019-12-02 DIAGNOSIS — E1010 Type 1 diabetes mellitus with ketoacidosis without coma: Secondary | ICD-10-CM | POA: Diagnosis not present

## 2019-12-02 DIAGNOSIS — Z20822 Contact with and (suspected) exposure to covid-19: Secondary | ICD-10-CM | POA: Diagnosis not present

## 2019-12-02 DIAGNOSIS — R000 Tachycardia, unspecified: Secondary | ICD-10-CM | POA: Diagnosis not present

## 2019-12-02 DIAGNOSIS — E875 Hyperkalemia: Secondary | ICD-10-CM | POA: Diagnosis present

## 2019-12-02 DIAGNOSIS — T383X6A Underdosing of insulin and oral hypoglycemic [antidiabetic] drugs, initial encounter: Secondary | ICD-10-CM | POA: Diagnosis not present

## 2019-12-02 DIAGNOSIS — R58 Hemorrhage, not elsewhere classified: Secondary | ICD-10-CM | POA: Diagnosis not present

## 2019-12-02 DIAGNOSIS — R112 Nausea with vomiting, unspecified: Secondary | ICD-10-CM

## 2019-12-02 DIAGNOSIS — D72829 Elevated white blood cell count, unspecified: Secondary | ICD-10-CM | POA: Diagnosis not present

## 2019-12-02 DIAGNOSIS — E1165 Type 2 diabetes mellitus with hyperglycemia: Secondary | ICD-10-CM | POA: Diagnosis not present

## 2019-12-02 DIAGNOSIS — R1012 Left upper quadrant pain: Secondary | ICD-10-CM | POA: Diagnosis not present

## 2019-12-02 DIAGNOSIS — D729 Disorder of white blood cells, unspecified: Secondary | ICD-10-CM

## 2019-12-02 DIAGNOSIS — R52 Pain, unspecified: Secondary | ICD-10-CM | POA: Diagnosis not present

## 2019-12-02 HISTORY — DX: Type 2 diabetes mellitus without complications: E11.9

## 2019-12-02 LAB — CBC
HCT: 46 % (ref 36.0–46.0)
Hemoglobin: 14.5 g/dL (ref 12.0–15.0)
MCH: 31.4 pg (ref 26.0–34.0)
MCHC: 31.5 g/dL (ref 30.0–36.0)
MCV: 99.6 fL (ref 80.0–100.0)
Platelets: 435 10*3/uL — ABNORMAL HIGH (ref 150–400)
RBC: 4.62 MIL/uL (ref 3.87–5.11)
RDW: 12.4 % (ref 11.5–15.5)
WBC: 33.1 10*3/uL — ABNORMAL HIGH (ref 4.0–10.5)
nRBC: 0 % (ref 0.0–0.2)

## 2019-12-02 LAB — CBG MONITORING, ED: Glucose-Capillary: 435 mg/dL — ABNORMAL HIGH (ref 70–99)

## 2019-12-02 MED ORDER — INSULIN REGULAR(HUMAN) IN NACL 100-0.9 UT/100ML-% IV SOLN
INTRAVENOUS | Status: DC
  Administered 2019-12-03: 8 [IU]/h via INTRAVENOUS
  Filled 2019-12-02: qty 100

## 2019-12-02 MED ORDER — SODIUM CHLORIDE 0.9 % IV SOLN: INTRAVENOUS | Status: DC

## 2019-12-02 MED ORDER — DEXTROSE 50 % IV SOLN: 0.0000 mL | INTRAVENOUS | Status: DC | PRN

## 2019-12-02 MED ORDER — SODIUM CHLORIDE 0.9 % IV BOLUS
1000.0000 mL | INTRAVENOUS | Status: AC
  Administered 2019-12-03 (×2): 1000 mL via INTRAVENOUS

## 2019-12-02 MED ORDER — DEXTROSE-NACL 5-0.45 % IV SOLN: INTRAVENOUS | Status: DC

## 2019-12-02 NOTE — ED Provider Notes (Signed)
[name] DEPT Provider Note: Georgena Spurling, MD, FACEP  CSN: [id number] MRN: [id number] ARRIVAL: 12/02/19 at [place]  Hyperglycemia   HISTORY OF PRESENT ILLNESS  12/02/19 11:53 PM Judy Cook is a 19 y.o. female with insulin-dependent diabetes who called EMS for transport due to a self diagnosis of diabetic ketoacidosis which began earlier today.  Her sugar was 371 at home and 435 on arrival here.  She ran out of her insulin 2 days ago.  She has had nausea and vomiting today.  She is having abdominal pain which she rates as a 7 out of 10, diffuse in nature and worse with movement or palpation.  She has been feeling short of breath.  She feels weak and her mouth feels dry.  She also just started her menses today.    Past Medical History:  Diagnosis Date  . Deliberate self-cutting   . Diabetes mellitus ([place])     History reviewed. No pertinent surgical history.  Family History  Problem Relation Age of Onset  . Diabetes Paternal Grandfather     Social History   Tobacco Use  . Smoking status: Never Smoker  . Smokeless tobacco: Never Used  Substance Use Topics  . Alcohol use: Never  . Drug use: Never    Prior to Admission medications   Medication Sig Start Date End Date Taking? Authorizing Provider  LANTUS SOLOSTAR 100 UNIT/ML Solostar Pen Inject 23 Units into the skin at bedtime. 02/04/19  Yes [provider]  NOVOLOG FLEXPEN 100 UNIT/ML FlexPen Inject 12 Units into the skin 3 (three) times daily before meals. Ratio/sliding scale ( Adjust carb) 02/04/19  Yes [provider]  blood glucose meter kit and supplies Dispense based on patient and insurance preference. Use up to four times daily as directed. (FOR ICD-10 E10.9, E11.9). 02/22/19   Lurline Del, DO    Allergies Patient has no known allergies.   REVIEW OF SYSTEMS  Negative except as noted here or in the History of Present Illness.   PHYSICAL  EXAMINATION  Initial Vital Signs Blood pressure 140/84, pulse (!) 107, temperature 98 F (36.7 C), temperature source Oral, resp. rate 20, height 5' (1.524 m), weight 55 kg, SpO2 100 %.  Examination General: Well-developed, well-nourished female in no acute distress; appearance consistent with age of record HENT: normocephalic; atraumatic; dry mucous membranes Eyes: pupils equal, round and reactive to light; extraocular muscles intact Neck: supple Heart: regular rate and rhythmps Lungs: clear to auscultation bilaterally Abdomen: soft; nondistended; diffusely tender; bowel sounds present Extremities: No deformity; full range of motion; pulses normal Neurologic: Awake but lethargic; motor function intact in all extremities and symmetric; no facial droop Skin: Warm and dry Psychiatric: Flat affect   RESULTS  Summary of this visit's results, reviewed and interpreted by myself:   EKG Interpretation  Date/Time:  Wednesday December 03 2019 03:06:37 EDT Ventricular Rate:  114 PR Interval:    QRS Duration: 73 QT Interval:  317 QTC Calculation: 437 R Axis:   64 Text Interpretation: Sinus tachycardia Rate is faster Confirmed by Glennon Kopko [phone]) on 12/03/2019 3:15:24 AM      Laboratory Studies: Results for orders placed or performed during the hospital encounter of 12/02/19 (from the past 24 hour(s))  CBG monitoring, ED     Status: Abnormal   Collection Time: 12/02/19  7:00 PM  Result Value Ref Range   Glucose-Capillary 435 (H) 70 - 99 mg/dL   Comment 1 Notify RN  CBC     Status: Abnormal   Collection Time: 12/02/19 11:26 PM  Result Value Ref Range   WBC 33.1 (H) 4.0 - 10.5 K/uL   RBC 4.62 3.87 - 5.11 MIL/uL   Hemoglobin 14.5 12.0 - 15.0 g/dL   HCT 46.0 36 - 46 %   MCV 99.6 80.0 - 100.0 fL   MCH 31.4 26.0 - 34.0 pg   MCHC 31.5 30.0 - 36.0 g/dL   RDW 12.4 11.5 - 15.5 %   Platelets 435 (H) 150 - 400 K/uL   nRBC 0.0 0.0 - 0.2 %  Urinalysis, Routine w reflex microscopic      Status: Abnormal   Collection Time: 12/02/19 11:39 PM  Result Value Ref Range   Color, Urine YELLOW YELLOW   APPearance CLEAR CLEAR   Specific Gravity, Urine 1.017 1.005 - 1.030   pH 5.0 5.0 - 8.0   Glucose, UA >=500 (A) NEGATIVE mg/dL   Hgb urine dipstick LARGE (A) NEGATIVE   Bilirubin Urine NEGATIVE NEGATIVE   Ketones, ur 80 (A) NEGATIVE mg/dL   Protein, ur 100 (A) NEGATIVE mg/dL   Nitrite NEGATIVE NEGATIVE   Leukocytes,Ua NEGATIVE NEGATIVE   RBC / HPF 0-5 0 - 5 RBC/hpf   WBC, UA 0-5 0 - 5 WBC/hpf   Bacteria, UA NONE SEEN NONE SEEN   Squamous Epithelial / LPF 0-5 0 - 5   Mucus PRESENT   I-Stat beta hCG blood, ED     Status: None   Collection Time: 12/02/19 11:57 PM  Result Value Ref Range   I-stat hCG, quantitative <5.0 <5 mIU/mL   Comment 3          Differential     Status: Abnormal   Collection Time: 12/02/19 11:57 PM  Result Value Ref Range   Neutrophils Relative % 85 %   Neutro Abs 27.7 (H) 1.7 - 7.7 K/uL   Lymphocytes Relative 7 %   Lymphs Abs 2.4 0.7 - 4.0 K/uL   Monocytes Relative 6 %   Monocytes Absolute 1.8 (H) 0 - 1 K/uL   Eosinophils Relative 0 %   Eosinophils Absolute 0.0 0 - 0 K/uL   Basophils Relative 0 %   Basophils Absolute 0.1 0 - 0 K/uL   Immature Granulocytes 2 %   Abs Immature Granulocytes 0.62 (H) 0.00 - 0.07 K/uL  Beta-hydroxybutyric acid     Status: Abnormal   Collection Time: 12/02/19 11:57 PM  Result Value Ref Range   Beta-Hydroxybutyric Acid >8.00 (H) 0.05 - 0.27 mmol/L  CBG monitoring, ED     Status: Abnormal   Collection Time: 12/02/19 11:59 PM  Result Value Ref Range   Glucose-Capillary >600 (HH) 70 - 99 mg/dL  Blood gas, venous     Status: Abnormal   Collection Time: 12/03/19  2:17 AM  Result Value Ref Range   pH, Ven 6.918 (LL) 7.25 - 7.43   pCO2, Ven 27.7 (L) 44 - 60 mmHg   pO2, Ven 39.8 32 - 45 mmHg   Bicarbonate 5.4 (L) 20.0 - 28.0 mmol/L   Acid-base deficit 28.1 (H) 0.0 - 2.0 mmol/L   O2 Saturation 56.9 %   Patient  temperature 98.6   I-Stat Creatinine, ED (not at [facility])     Status: None   Collection Time: 12/03/19  2:52 AM  Result Value Ref Range   Creatinine, Ser 1.00 0.44 - 1.00 mg/dL  CBG monitoring, ED     Status: Abnormal   Collection Time: 12/03/19  3:00 AM  Result Value Ref Range   Glucose-Capillary >600 (HH) 70 - 99 mg/dL  I-stat chem 8, ED (not at [facility])     Status: Abnormal   Collection Time: 12/03/19  3:01 AM  Result Value Ref Range   Sodium 129 (L) 135 - 145 mmol/L   Potassium 6.9 (HH) 3.5 - 5.1 mmol/L   Chloride 106 98 - 111 mmol/L   BUN 40 (H) 6 - 20 mg/dL   Creatinine, Ser 1.00 0.44 - 1.00 mg/dL   Glucose, Bld >700 (HH) 70 - 99 mg/dL   Calcium, Ion 1.23 1.15 - 1.40 mmol/L   TCO2 8 (L) 22 - 32 mmol/L   Hemoglobin 16.7 (H) 12.0 - 15.0 g/dL   HCT 49.0 (H) 36 - 46 %  CBG monitoring, ED     Status: Abnormal   Collection Time: 12/03/19  3:48 AM  Result Value Ref Range   Glucose-Capillary >600 (HH) 70 - 99 mg/dL  CBG monitoring, ED     Status: Abnormal   Collection Time: 12/03/19  4:32 AM  Result Value Ref Range   Glucose-Capillary >600 (HH) 70 - 99 mg/dL  Blood gas, arterial (at [facility])     Status: Abnormal   Collection Time: 12/03/19  4:42 AM  Result Value Ref Range   FIO2 21.00    pH, Arterial 7.066 (LL) 7.35 - 7.45   pCO2 arterial 13.1 (LL) 32 - 48 mmHg   pO2, Arterial 134 (H) 83 - 108 mmHg   Bicarbonate 3.6 (L) 20.0 - 28.0 mmol/L   Acid-base deficit 26.5 (H) 0.0 - 2.0 mmol/L   O2 Saturation 98.2 %   Patient temperature 98.0    Allens test (pass/fail) PASS PASS  CBG monitoring, ED     Status: Abnormal   Collection Time: 12/03/19  5:02 AM  Result Value Ref Range   Glucose-Capillary >600 (HH) 70 - 99 mg/dL  CBG monitoring, ED     Status: Abnormal   Collection Time: 12/03/19  5:41 AM  Result Value Ref Range   Glucose-Capillary 527 (HH) 70 - 99 mg/dL   Comment 1 Notify RN   Basic metabolic panel     Status: Abnormal   Collection Time: 12/03/19  6:05 AM  Result  Value Ref Range   Sodium 131 (L) 135 - 145 mmol/L   Potassium 5.9 (H) 3.5 - 5.1 mmol/L   Chloride 105 98 - 111 mmol/L   CO2 <7 (L) 22 - 32 mmol/L   Glucose, Bld 554 (HH) 70 - 99 mg/dL   BUN 27 (H) 6 - 20 mg/dL   Creatinine, Ser 1.26 (H) 0.44 - 1.00 mg/dL   Calcium 7.8 (L) 8.9 - 10.3 mg/dL   GFR calc non Af Amer >60 >60 mL/min   GFR calc Af Amer >60 >60 mL/min   Anion gap NOT CALCULATED 5 - 15  Hemoglobin A1c     Status: Abnormal   Collection Time: 12/03/19  6:05 AM  Result Value Ref Range   Hgb A1c MFr Bld 12.0 (H) 4.8 - 5.6 %   Mean Plasma Glucose 297.7 mg/dL  CBG monitoring, ED     Status: Abnormal   Collection Time: 12/03/19  6:14 AM  Result Value Ref Range   Glucose-Capillary 462 (H) 70 - 99 mg/dL  CBG monitoring, ED     Status: Abnormal   Collection Time: 12/03/19  6:47 AM  Result Value Ref Range   Glucose-Capillary 510 (HH) 70 - 99 mg/dL   Imaging Studies:  No results found.  ED COURSE and MDM  Nursing notes, initial and subsequent vitals signs, including pulse oximetry, reviewed and interpreted by myself.  Vitals:   12/03/19 0400 12/03/19 0500 12/03/19 0600 12/03/19 0650  BP: 96/83 125/74 127/84 (!) 135/91  Pulse: (!) 114 (!) 118 (!) 117 (!) 123  Resp: (!) 21 (!) 22 (!) 26 (!) 28  Temp:      TempSrc:      SpO2: 100% 100% 98% 100%  Weight:      Height:       Medications  enoxaparin (LOVENOX) injection 40 mg (has no administration in time range)  insulin regular, human (MYXREDLIN) 100 units/ 100 mL infusion (8.5 Units/hr Intravenous Rate/Dose Change 12/03/19 0648)  0.9 %  sodium chloride infusion (0 mLs Intravenous Hold 12/03/19 0530)  dextrose 5 %-0.45 % sodium chloride infusion (0 mLs Intravenous Hold 12/03/19 0530)  dextrose 50 % solution 0-50 mL (has no administration in time range)  famotidine (PEPCID) IVPB 20 mg premix (has no administration in time range)  sodium chloride 0.9 % bolus 1,000 mL (0 mLs Intravenous Stopped 12/03/19 0455)  ondansetron (ZOFRAN)  injection 4 mg (4 mg Intravenous Given 12/03/19 0020)  sodium bicarbonate injection 100 mEq (100 mEq Intravenous Given 12/03/19 0312)  sodium chloride 0.9 % bolus 1,000 mL (1,000 mLs Intravenous New Bag/Given 12/03/19 0455)   12:05 AM Insulin drip per DKA Endo tool protocol initiated along with IV fluid bolus.  1:27 AM Still awaiting blood gas results.  Clinical presentation is consistent with DKA.  2:40 AM Blood gas shows profound acidosis.  2 A of bicarb ordered.  Insulin drip has not been started because the Endotool will not permit insulin to be given until the creatinine is known.  I have ordered IV insulin at 0.1 units/kg/h pending creatinine result then conversion to Endotool protocol.  3:03 AM Blood again refused by lab due to hemolysis.  I-STAT Chem-8 results show potassium of 6.9 and glucose greater than 700.  It is unclear if this represents a hemolyzed sample causing a falsely elevated potassium of 6.9.  3:15 AM The patient's EKG does not show the tall, peaked T waves that would be expected with hyperkalemia.  5:05 AM Patient's pH is now above 7.  PCO2 is low consistent with respiratory compensation for acidosis as well as recent bicarb dose.  Dr. Myna Hidalgo to admit to hospitalist service.  PROCEDURES  Procedures   CRITICAL CARE Performed by: Karen Chafe Alisia Vanengen Total critical care time: 60 minutes Critical care time was exclusive of separately billable procedures and treating other patients. Critical care was necessary to treat or prevent imminent or life-threatening deterioration. Critical care was time spent personally by me on the following activities: development of treatment plan with patient and/or surrogate as well as nursing, discussions with consultants, evaluation of patient's response to treatment, examination of patient, obtaining history from patient or surrogate, ordering and performing treatments and interventions, ordering and review of laboratory studies, ordering and  review of radiographic studies, pulse oximetry and re-evaluation of patient's condition.   ED DIAGNOSES     ICD-10-CM   1. Diabetic ketoacidosis without coma associated with type 1 diabetes mellitus ([place])  E10.10   2. Neutrophilic leukocytosis  T65.4        Tasman Zapata, MD 12/03/19 [phone]

## 2019-12-02 NOTE — ED Triage Notes (Signed)
Patient arrives via [facility] for DKA from home. CBG was 371, stopped taking insulin 2 days ago.  Today started with N/V and abdominal pain.  Started her period today.  Vitals 28-RR, 85-HR, 130/80-BP, and 100% on room air. 22g left hand.

## 2019-12-03 ENCOUNTER — Encounter (HOSPITAL_COMMUNITY): Payer: Self-pay | Admitting: Family Medicine

## 2019-12-03 ENCOUNTER — Inpatient Hospital Stay: Payer: Self-pay

## 2019-12-03 DIAGNOSIS — E860 Dehydration: Secondary | ICD-10-CM | POA: Diagnosis present

## 2019-12-03 DIAGNOSIS — R6510 Systemic inflammatory response syndrome (SIRS) of non-infectious origin without acute organ dysfunction: Secondary | ICD-10-CM | POA: Diagnosis present

## 2019-12-03 DIAGNOSIS — E875 Hyperkalemia: Secondary | ICD-10-CM | POA: Diagnosis not present

## 2019-12-03 DIAGNOSIS — E1010 Type 1 diabetes mellitus with ketoacidosis without coma: Principal | ICD-10-CM

## 2019-12-03 DIAGNOSIS — R1012 Left upper quadrant pain: Secondary | ICD-10-CM | POA: Diagnosis not present

## 2019-12-03 DIAGNOSIS — R1110 Vomiting, unspecified: Secondary | ICD-10-CM | POA: Diagnosis not present

## 2019-12-03 DIAGNOSIS — Z794 Long term (current) use of insulin: Secondary | ICD-10-CM | POA: Diagnosis not present

## 2019-12-03 DIAGNOSIS — Z833 Family history of diabetes mellitus: Secondary | ICD-10-CM | POA: Diagnosis not present

## 2019-12-03 DIAGNOSIS — Z79899 Other long term (current) drug therapy: Secondary | ICD-10-CM | POA: Diagnosis not present

## 2019-12-03 DIAGNOSIS — Z20822 Contact with and (suspected) exposure to covid-19: Secondary | ICD-10-CM | POA: Diagnosis present

## 2019-12-03 DIAGNOSIS — Z915 Personal history of self-harm: Secondary | ICD-10-CM | POA: Diagnosis not present

## 2019-12-03 DIAGNOSIS — E1110 Type 2 diabetes mellitus with ketoacidosis without coma: Secondary | ICD-10-CM | POA: Diagnosis present

## 2019-12-03 DIAGNOSIS — T383X6A Underdosing of insulin and oral hypoglycemic [antidiabetic] drugs, initial encounter: Secondary | ICD-10-CM | POA: Diagnosis present

## 2019-12-03 DIAGNOSIS — R739 Hyperglycemia, unspecified: Secondary | ICD-10-CM | POA: Diagnosis present

## 2019-12-03 DIAGNOSIS — K6389 Other specified diseases of intestine: Secondary | ICD-10-CM | POA: Diagnosis not present

## 2019-12-03 DIAGNOSIS — Z9114 Patient's other noncompliance with medication regimen: Secondary | ICD-10-CM | POA: Diagnosis not present

## 2019-12-03 HISTORY — DX: Systemic inflammatory response syndrome (sirs) of non-infectious origin without acute organ dysfunction: R65.10

## 2019-12-03 LAB — BASIC METABOLIC PANEL
Anion gap: 12 (ref 5–15)
Anion gap: 12 (ref 5–15)
Anion gap: 9 (ref 5–15)
BUN: 27 mg/dL — ABNORMAL HIGH (ref 6–20)
BUN: 20 mg/dL (ref 6–20)
BUN: 19 mg/dL (ref 6–20)
BUN: 15 mg/dL (ref 6–20)
CO2: <7 mmol/L — ABNORMAL LOW (ref 22–32)
CO2: 9 mmol/L — ABNORMAL LOW (ref 22–32)
CO2: 10 mmol/L — ABNORMAL LOW (ref 22–32)
CO2: 13 mmol/L — ABNORMAL LOW (ref 22–32)
Calcium: 7.8 mg/dL — ABNORMAL LOW (ref 8.9–10.3)
Calcium: 7.6 mg/dL — ABNORMAL LOW (ref 8.9–10.3)
Calcium: 7.7 mg/dL — ABNORMAL LOW (ref 8.9–10.3)
Calcium: 7.9 mg/dL — ABNORMAL LOW (ref 8.9–10.3)
Chloride: 105 mmol/L (ref 98–111)
Chloride: 113 mmol/L — ABNORMAL HIGH (ref 98–111)
Chloride: 113 mmol/L — ABNORMAL HIGH (ref 98–111)
Chloride: 114 mmol/L — ABNORMAL HIGH (ref 98–111)
Creatinine, Ser: 1.26 mg/dL — ABNORMAL HIGH (ref 0.44–1.00)
Creatinine, Ser: 0.98 mg/dL (ref 0.44–1.00)
Creatinine, Ser: 0.87 mg/dL (ref 0.44–1.00)
Creatinine, Ser: 0.77 mg/dL (ref 0.44–1.00)
GFR calc Af Amer: >60 mL/min (ref >60)
GFR calc Af Amer: >60 mL/min (ref >60)
GFR calc Af Amer: >60 mL/min (ref >60)
GFR calc Af Amer: >60 mL/min (ref >60)
GFR calc non Af Amer: >60 mL/min (ref >60)
GFR calc non Af Amer: >60 mL/min (ref >60)
GFR calc non Af Amer: >60 mL/min (ref >60)
GFR calc non Af Amer: >60 mL/min (ref >60)
Glucose, Bld: 554 mg/dL (ref 70–99)
Glucose, Bld: 215 mg/dL — ABNORMAL HIGH (ref 70–99)
Glucose, Bld: 223 mg/dL — ABNORMAL HIGH (ref 70–99)
Glucose, Bld: 131 mg/dL — ABNORMAL HIGH (ref 70–99)
Potassium: 5.9 mmol/L — ABNORMAL HIGH (ref 3.5–5.1)
Potassium: 3.4 mmol/L — ABNORMAL LOW (ref 3.5–5.1)
Potassium: 3.4 mmol/L — ABNORMAL LOW (ref 3.5–5.1)
Potassium: 3.7 mmol/L (ref 3.5–5.1)
Sodium: 131 mmol/L — ABNORMAL LOW (ref 135–145)
Sodium: 134 mmol/L — ABNORMAL LOW (ref 135–145)
Sodium: 135 mmol/L (ref 135–145)
Sodium: 136 mmol/L (ref 135–145)

## 2019-12-03 LAB — BLOOD GAS, ARTERIAL
Acid-base deficit: 26.5 mmol/L — ABNORMAL HIGH (ref 0.0–2.0)
Bicarbonate: 3.6 mmol/L — ABNORMAL LOW (ref 20.0–28.0)
FIO2: 21
O2 Saturation: 98.2 %
Patient temperature: 98
pCO2 arterial: 13.1 mmHg — CL (ref 32.0–48.0)
pH, Arterial: 7.066 — CL (ref 7.350–7.450)
pO2, Arterial: 134 mmHg — ABNORMAL HIGH (ref 83.0–108.0)

## 2019-12-03 LAB — I-STAT CHEM 8, ED
BUN: 40 mg/dL — ABNORMAL HIGH (ref 6–20)
Calcium, Ion: 1.23 mmol/L (ref 1.15–1.40)
Chloride: 106 mmol/L (ref 98–111)
Creatinine, Ser: 1 mg/dL (ref 0.44–1.00)
Glucose, Bld: >700 mg/dL (ref 70–99)
HCT: 49 % — ABNORMAL HIGH (ref 36.0–46.0)
Hemoglobin: 16.7 g/dL — ABNORMAL HIGH (ref 12.0–15.0)
Potassium: 6.9 mmol/L (ref 3.5–5.1)
Sodium: 129 mmol/L — ABNORMAL LOW (ref 135–145)
TCO2: 8 mmol/L — ABNORMAL LOW (ref 22–32)

## 2019-12-03 LAB — CBG MONITORING, ED
Glucose-Capillary: 462 mg/dL — ABNORMAL HIGH (ref 70–99)
Glucose-Capillary: 484 mg/dL — ABNORMAL HIGH (ref 70–99)
Glucose-Capillary: 510 mg/dL (ref 70–99)
Glucose-Capillary: 527 mg/dL (ref 70–99)
Glucose-Capillary: >600 mg/dL (ref 70–99)
Glucose-Capillary: >600 mg/dL (ref 70–99)
Glucose-Capillary: >600 mg/dL (ref 70–99)
Glucose-Capillary: >600 mg/dL (ref 70–99)
Glucose-Capillary: >600 mg/dL (ref 70–99)

## 2019-12-03 LAB — DIFFERENTIAL
Abs Immature Granulocytes: 0.62 10*3/uL — ABNORMAL HIGH (ref 0.00–0.07)
Basophils Absolute: 0.1 10*3/uL (ref 0.0–0.1)
Basophils Relative: 0 %
Eosinophils Absolute: 0 10*3/uL (ref 0.0–0.5)
Eosinophils Relative: 0 %
Immature Granulocytes: 2 %
Lymphocytes Relative: 7 %
Lymphs Abs: 2.4 10*3/uL (ref 0.7–4.0)
Monocytes Absolute: 1.8 10*3/uL — ABNORMAL HIGH (ref 0.1–1.0)
Monocytes Relative: 6 %
Neutro Abs: 27.7 10*3/uL — ABNORMAL HIGH (ref 1.7–7.7)
Neutrophils Relative %: 85 %

## 2019-12-03 LAB — GLUCOSE, CAPILLARY
Glucose-Capillary: 124 mg/dL — ABNORMAL HIGH (ref 70–99)
Glucose-Capillary: 152 mg/dL — ABNORMAL HIGH (ref 70–99)
Glucose-Capillary: 154 mg/dL — ABNORMAL HIGH (ref 70–99)
Glucose-Capillary: 154 mg/dL — ABNORMAL HIGH (ref 70–99)
Glucose-Capillary: 188 mg/dL — ABNORMAL HIGH (ref 70–99)
Glucose-Capillary: 194 mg/dL — ABNORMAL HIGH (ref 70–99)
Glucose-Capillary: 207 mg/dL — ABNORMAL HIGH (ref 70–99)
Glucose-Capillary: 220 mg/dL — ABNORMAL HIGH (ref 70–99)
Glucose-Capillary: 225 mg/dL — ABNORMAL HIGH (ref 70–99)
Glucose-Capillary: 249 mg/dL — ABNORMAL HIGH (ref 70–99)
Glucose-Capillary: 335 mg/dL — ABNORMAL HIGH (ref 70–99)
Glucose-Capillary: 396 mg/dL — ABNORMAL HIGH (ref 70–99)
Glucose-Capillary: 402 mg/dL — ABNORMAL HIGH (ref 70–99)
Glucose-Capillary: 85 mg/dL (ref 70–99)

## 2019-12-03 LAB — URINALYSIS, ROUTINE W REFLEX MICROSCOPIC
Bacteria, UA: NONE SEEN
Bilirubin Urine: NEGATIVE
Glucose, UA: >=500 mg/dL — AB
Ketones, ur: 80 mg/dL — AB
Leukocytes,Ua: NEGATIVE
Nitrite: NEGATIVE
Protein, ur: 100 mg/dL — AB
Specific Gravity, Urine: 1.017 (ref 1.005–1.030)
pH: 5 (ref 5.0–8.0)

## 2019-12-03 LAB — I-STAT BETA HCG BLOOD, ED (MC, WL, AP ONLY): I-stat hCG, quantitative: <5 m[IU]/mL (ref <5)

## 2019-12-03 LAB — BETA-HYDROXYBUTYRIC ACID
Beta-Hydroxybutyric Acid: >8 mmol/L — ABNORMAL HIGH (ref 0.05–0.27)
Beta-Hydroxybutyric Acid: >8 mmol/L — ABNORMAL HIGH (ref 0.05–0.27)
Beta-Hydroxybutyric Acid: 6.97 mmol/L — ABNORMAL HIGH (ref 0.05–0.27)
Beta-Hydroxybutyric Acid: 1.18 mmol/L — ABNORMAL HIGH (ref 0.05–0.27)

## 2019-12-03 LAB — BLOOD GAS, VENOUS
Acid-base deficit: 28.1 mmol/L — ABNORMAL HIGH (ref 0.0–2.0)
Acid-base deficit: 25.1 mmol/L — ABNORMAL HIGH (ref 0.0–2.0)
Acid-base deficit: 15.4 mmol/L — ABNORMAL HIGH (ref 0.0–2.0)
Bicarbonate: 5.4 mmol/L — ABNORMAL LOW (ref 20.0–28.0)
Bicarbonate: 3.8 mmol/L — ABNORMAL LOW (ref 20.0–28.0)
Bicarbonate: 10.2 mmol/L — ABNORMAL LOW (ref 20.0–28.0)
O2 Saturation: 56.9 %
O2 Saturation: 99.3 %
O2 Saturation: 95.5 %
Patient temperature: 98.6
Patient temperature: 98.6
Patient temperature: 98.6
pCO2, Ven: 27.7 mmHg — ABNORMAL LOW (ref 44.0–60.0)
pCO2, Ven: <19 mmHg — CL (ref 44.0–60.0)
pCO2, Ven: 23.7 mmHg — ABNORMAL LOW (ref 44.0–60.0)
pH, Ven: 6.918 — CL (ref 7.250–7.430)
pH, Ven: 7.132 — CL (ref 7.250–7.430)
pH, Ven: 7.256 (ref 7.250–7.430)
pO2, Ven: 39.8 mmHg (ref 32.0–45.0)
pO2, Ven: 182 mmHg — ABNORMAL HIGH (ref 32.0–45.0)
pO2, Ven: 77 mmHg — ABNORMAL HIGH (ref 32.0–45.0)

## 2019-12-03 LAB — MRSA PCR SCREENING: MRSA by PCR: NEGATIVE

## 2019-12-03 LAB — HEMOGLOBIN A1C
Hgb A1c MFr Bld: 12 % — ABNORMAL HIGH (ref 4.8–5.6)
Mean Plasma Glucose: 297.7 mg/dL

## 2019-12-03 LAB — SARS CORONAVIRUS 2 BY RT PCR (HOSPITAL ORDER, PERFORMED IN ~~LOC~~ HOSPITAL LAB): SARS Coronavirus 2: NEGATIVE

## 2019-12-03 LAB — I-STAT CREATININE, ED: Creatinine, Ser: 1 mg/dL (ref 0.44–1.00)

## 2019-12-03 MED ORDER — DEXTROSE-NACL 5-0.45 % IV SOLN: INTRAVENOUS | Status: DC

## 2019-12-03 MED ORDER — SODIUM CHLORIDE 0.9% FLUSH: 10.0000 mL | INTRAVENOUS | Status: DC | PRN

## 2019-12-03 MED ORDER — SODIUM BICARBONATE-DEXTROSE 150-5 MEQ/L-% IV SOLN
150.0000 meq | INTRAVENOUS | Status: DC
  Filled 2019-12-03: qty 1000

## 2019-12-03 MED ORDER — ONDANSETRON HCL 4 MG/2ML IJ SOLN
4.0000 mg | Freq: Four times a day (QID) | INTRAMUSCULAR | Status: DC | PRN
  Administered 2019-12-04 (×2): 4 mg via INTRAVENOUS
  Filled 2019-12-03 (×2): qty 2

## 2019-12-03 MED ORDER — SENNOSIDES-DOCUSATE SODIUM 8.6-50 MG PO TABS: 2.0000 | ORAL_TABLET | Freq: Every evening | ORAL | Status: DC | PRN

## 2019-12-03 MED ORDER — SODIUM CHLORIDE 0.9% FLUSH
10.0000 mL | Freq: Two times a day (BID) | INTRAVENOUS | Status: DC
  Administered 2019-12-03 – 2019-12-06 (×5): 10 mL
  Administered 2019-12-06: 20 mL

## 2019-12-03 MED ORDER — CHLORHEXIDINE GLUCONATE CLOTH 2 % EX PADS
6.0000 | MEDICATED_PAD | Freq: Every day | CUTANEOUS | Status: DC
  Administered 2019-12-03 – 2019-12-08 (×6): 6 via TOPICAL

## 2019-12-03 MED ORDER — ENOXAPARIN SODIUM 40 MG/0.4ML ~~LOC~~ SOLN
40.0000 mg | SUBCUTANEOUS | Status: DC
  Administered 2019-12-03 – 2019-12-08 (×6): 40 mg via SUBCUTANEOUS
  Filled 2019-12-03 (×5): qty 0.4

## 2019-12-03 MED ORDER — ONDANSETRON HCL 4 MG/5ML PO SOLN
4.0000 mg | Freq: Three times a day (TID) | ORAL | Status: DC | PRN
  Filled 2019-12-03: qty 5

## 2019-12-03 MED ORDER — SODIUM BICARBONATE 8.4 % IV SOLN
INTRAVENOUS | Status: DC
  Filled 2019-12-03 (×3): qty 150

## 2019-12-03 MED ORDER — DEXTROSE 50 % IV SOLN: 0.0000 mL | INTRAVENOUS | Status: DC | PRN

## 2019-12-03 MED ORDER — SODIUM CHLORIDE 0.9 % IV BOLUS
1000.0000 mL | Freq: Once | INTRAVENOUS | Status: AC
  Administered 2019-12-03: 1000 mL via INTRAVENOUS

## 2019-12-03 MED ORDER — INSULIN REGULAR(HUMAN) IN NACL 100-0.9 UT/100ML-% IV SOLN
INTRAVENOUS | Status: DC
  Administered 2019-12-03: 8.5 [IU]/h via INTRAVENOUS
  Administered 2019-12-03: 15 [IU]/h via INTRAVENOUS
  Filled 2019-12-03 (×3): qty 100

## 2019-12-03 MED ORDER — POTASSIUM CHLORIDE CRYS ER 20 MEQ PO TBCR
40.0000 meq | EXTENDED_RELEASE_TABLET | Freq: Once | ORAL | Status: AC
  Administered 2019-12-03: 40 meq via ORAL
  Filled 2019-12-03 (×2): qty 2

## 2019-12-03 MED ORDER — ONDANSETRON HCL 4 MG/2ML IJ SOLN
4.0000 mg | Freq: Once | INTRAMUSCULAR | Status: AC
  Administered 2019-12-03: 4 mg via INTRAVENOUS
  Filled 2019-12-03: qty 2

## 2019-12-03 MED ORDER — FAMOTIDINE IN NACL 20-0.9 MG/50ML-% IV SOLN
20.0000 mg | Freq: Two times a day (BID) | INTRAVENOUS | Status: DC
  Administered 2019-12-03 – 2019-12-06 (×7): 20 mg via INTRAVENOUS
  Filled 2019-12-03 (×8): qty 50

## 2019-12-03 MED ORDER — SODIUM BICARBONATE 8.4 % IV SOLN
100.0000 meq | Freq: Once | INTRAVENOUS | Status: AC
  Administered 2019-12-03: 100 meq via INTRAVENOUS
  Filled 2019-12-03: qty 100

## 2019-12-03 MED ORDER — SODIUM CHLORIDE 0.9 % IV SOLN: INTRAVENOUS | Status: DC

## 2019-12-03 MED ORDER — POLYETHYLENE GLYCOL 3350 17 G PO PACK: 17.0000 g | PACK | Freq: Every day | ORAL | Status: DC | PRN

## 2019-12-03 NOTE — Progress Notes (Signed)
CRITICAL VALUE ALERT  Critical Value: pH 7.132 pCO2 19>  Date & Time Notied:  1411 12/03/19  Provider Notified: Dr. Nelson Chimes  Orders Received/Actions taken: Awaiting orders, will continue to monitor

## 2019-12-03 NOTE — ED Notes (Signed)
While this Clinical research associate was in the lobby updating v/s, the pt came to Clinical research associate c/o difficulty breathing.  I checked the pt's oxygen level and she was at 100% on room air. I advised pt of this and she was able to calm down. Pt did spit a small amount of dark red, clotted blood into the trashcan while in the presence of this Clinical research associate. Pt states she has been vomiting blood all day.  I then obtained bloodwork from her and before she went to sit w/ the rest of the lobby she began complaining of difficulty breathing again. Pt's oxygen was checked again and she was still satting in the upper 90s on room air. Pt again coached to slow breathing and take deep breaths, which were successful.

## 2019-12-03 NOTE — ED Notes (Signed)
Date and time results received: 12/03/19 2:32 AM  (use smartphrase ".now" to insert current time)  Test: pH Critical Value: 6.918  Name of Provider Notified: Dr.Molpus  Orders Received? Or Actions Taken?:2 amps Sodium Bicarbonate IVP

## 2019-12-03 NOTE — Progress Notes (Signed)
Peripherally Inserted Central Catheter Placement  The IV Nurse has discussed with the patient and/or persons authorized to consent for the patient, the purpose of this procedure and the potential benefits and risks involved with this procedure.  The benefits include less needle sticks, lab draws from the catheter, and the patient may be discharged home with the catheter. Risks include, but not limited to, infection, bleeding, blood clot (thrombus formation), and puncture of an artery; nerve damage and irregular heartbeat and possibility to perform a PICC exchange if needed/ordered by physician.  Alternatives to this procedure were also discussed.  Bard Power PICC patient education guide, fact sheet on infection prevention and patient information card has been provided to patient /or left at bedside.    PICC Placement Documentation  PICC Double Lumen 12/03/19 PICC Right Basilic 35 cm 4 cm (Active)  Indication for Insertion or Continuance of Line Poor Vasculature-patient has had multiple peripheral attempts or PIVs lasting less than 24 hours 12/03/19 1502  Exposed Catheter (cm) 4 cm 12/03/19 1502  Site Assessment Clean;Dry;Intact 12/03/19 1502  Lumen #1 Status Flushed;Blood return noted 12/03/19 1502  Lumen #2 Status Flushed;Blood return noted 12/03/19 1502  Dressing Type Transparent 12/03/19 1502  Dressing Status Clean;Dry;Intact;Antimicrobial disc in place;Other (Comment) 12/03/19 1502  Dressing Intervention New dressing 12/03/19 1502  Dressing Change Due 12/10/19 12/03/19 1502       Judy Cook 12/03/2019, 3:02 PM

## 2019-12-03 NOTE — Progress Notes (Signed)
Insulin gtt paused at 1040 d/t loss of IV access. Provider notified, IV team consult placed d/t difficult stick.

## 2019-12-03 NOTE — ED Notes (Signed)
Lab called, states BMP is hemolyzed. States they can have someone from phlebotomy come draw around 4am

## 2019-12-03 NOTE — H&P (Signed)
History and Physical    Judy Cook YFV:[id number] DOB: [birth year] DOA: 12/02/2019  PCP: Ivory Broad C, FNP   Patient coming from: Home   Chief Complaint: Hyperglycemia, N/V  HPI: Judy Cook is a 19 y.o. female with medical history significant for type 1 diabetes mellitus, now presenting to the emergency department for evaluation of fatigue, hyperglycemia, nausea, and vomiting. Patient reports that she had been visiting a relative in [place] recently and accidentally left her Lantus there when she returned 3 days ago. She has continued to use her short acting insulin but has developed fatigue, malaise, abdominal discomfort, nausea, and vomiting. She suspected that she was in DKA and presented to the emergency department. She denies any fevers, chills, cough, shortness of breath, dysuria, flank pain, or diarrhea.  ED Course: Upon arrival to the ED, patient is found to be afebrile, saturating well on room air, tachycardic in the 110s, and with blood pressure 96/80. EKG features sinus tachycardia. Labs are notable for potassium 6.9, bicarbonate 8, glucose >700, BHOB >8, leukocytosis to 33,100, and thrombocytosis. Urinalysis features ketonuria. Patient was given 2 L of saline, 2 ampoules of bicarbonate, and started on insulin infusion. COVID-19 screening test not yet collected.  Review of Systems:  All other systems reviewed and apart from HPI, are negative.  Past Medical History:  Diagnosis Date   Deliberate self-cutting    Diabetes mellitus ([place])     History reviewed. No pertinent surgical history.  Social History:   reports that she has never smoked. She has never used smokeless tobacco. She reports that she does not drink alcohol and does not use drugs.  No Known Allergies  Family History  Problem Relation Age of Onset   Diabetes Paternal Grandfather      Prior to Admission medications   Medication Sig Start Date End Date Taking? Authorizing Provider    LANTUS SOLOSTAR 100 UNIT/ML Solostar Pen Inject 23 Units into the skin at bedtime. 02/04/19  Yes [provider]  NOVOLOG FLEXPEN 100 UNIT/ML FlexPen Inject 12 Units into the skin 3 (three) times daily before meals. Ratio/sliding scale ( Adjust carb) 02/04/19  Yes [provider]  blood glucose meter kit and supplies Dispense based on patient and insurance preference. Use up to four times daily as directed. (FOR ICD-10 E10.9, E11.9). 02/22/19   Lurline Del, DO    Physical Exam: Vitals:   12/03/19 0100 12/03/19 0300 12/03/19 0400 12/03/19 0500  BP: 105/82 139/84 96/83 125/74  Pulse: (!) 108 (!) 114 (!) 114 (!) 118  Resp: (!) 28 (!) 35 (!) 21 (!) 22  Temp:      TempSrc:      SpO2: 100% 100% 100% 100%  Weight:      Height:        Constitutional: Not in acute distress, appears uncomfortable   Eyes: PERTLA, lids and conjunctivae normal ENMT: Mucous membranes are dry. Posterior pharynx clear of any exudate or lesions.   Neck: normal, supple, no masses, no thyromegaly Respiratory: Tachypneic, no wheezing, no crackles. No pallor or cyanosis.  Cardiovascular: Rate ~110 and regular. No extremity edema.  Abdomen: No distension, no tenderness, soft. Bowel sounds active.  Musculoskeletal: no clubbing / cyanosis. No joint deformity upper and lower extremities.   Skin: no significant rashes, lesions, ulcers. Warm, dry, well-perfused. Neurologic: CN 2-12 grossly intact. Sensation intact. Moving all extremities.  Psychiatric: Alert and oriented to person, place, and situation. Very pleasant and cooperative.    Labs and Imaging on Admission: I  personally reviewed following labs and imaging studies ° °CBC: °Recent Labs  °Lab 12/02/19 °2326 12/02/19 °2357 12/03/19 °0301  °WBC 33.1*  --   --   °NEUTROABS  --  27.7*  --   °HGB 14.5  --  16.7*  °HCT 46.0  --  49.0*  °MCV 99.6  --   --   °PLT 435*  --   --   ° °Basic Metabolic Panel: °Recent Labs  °Lab 12/03/19 °0252 12/03/19 °0301   °NA  --  129*  °K  --  6.9*  °CL  --  106  °GLUCOSE  --  >700*  °BUN  --  40*  °CREATININE 1.00 1.00  ° °GFR: °Estimated Creatinine Clearance: 69.8 mL/min (by C-G formula based on SCr of 1 mg/dL). °Liver Function Tests: °No results for input(s): AST, ALT, ALKPHOS, BILITOT, PROT, ALBUMIN in the last 168 hours. °No results for input(s): LIPASE, AMYLASE in the last 168 hours. °No results for input(s): AMMONIA in the last 168 hours. °Coagulation Profile: °No results for input(s): INR, PROTIME in the last 168 hours. °Cardiac Enzymes: °No results for input(s): CKTOTAL, CKMB, CKMBINDEX, TROPONINI in the last 168 hours. °BNP (last 3 results) °No results for input(s): PROBNP in the last 8760 hours. °HbA1C: °No results for input(s): HGBA1C in the last 72 hours. °CBG: °Recent Labs  °Lab 12/03/19 °0300 12/03/19 °0348 12/03/19 °0432 12/03/19 °0502 12/03/19 °0541  °GLUCAP >600* >600* >600* >600* 527*  ° °Lipid Profile: °No results for input(s): CHOL, HDL, LDLCALC, TRIG, CHOLHDL, LDLDIRECT in the last 72 hours. °Thyroid Function Tests: °No results for input(s): TSH, T4TOTAL, FREET4, T3FREE, THYROIDAB in the last 72 hours. °Anemia Panel: °No results for input(s): VITAMINB12, FOLATE, FERRITIN, TIBC, IRON, RETICCTPCT in the last 72 hours. °Urine analysis: °   °Component Value Date/Time  ° COLORURINE YELLOW 12/02/2019 2339  ° APPEARANCEUR CLEAR 12/02/2019 2339  ° LABSPEC 1.017 12/02/2019 2339  ° PHURINE 5.0 12/02/2019 2339  ° GLUCOSEU >=500 (A) 12/02/2019 2339  ° HGBUR LARGE (A) 12/02/2019 2339  ° BILIRUBINUR NEGATIVE 12/02/2019 2339  ° KETONESUR 80 (A) 12/02/2019 2339  ° PROTEINUR 100 (A) 12/02/2019 2339  ° NITRITE NEGATIVE 12/02/2019 2339  ° LEUKOCYTESUR NEGATIVE 12/02/2019 2339  ° °Sepsis Labs: °@LABRCNTIP(procalcitonin:4,lacticidven:4) °)No results found for this or any previous visit (from the past 240 hour(s)).  ° °Radiological Exams on Admission: °No results found. ° °EKG: Independently reviewed. Sinus tachycardia (rate 114).   ° °Assessment/Plan  ° °1. DKA; type I DM  °- Patient has T1DM with A1c 10.8% in October 2020 and presents with hyperglycemia, fatigue, and N/V after going 2 days without her long-acting insulin and is found to be in DKA  °- She is being fluid-resuscitated in ED and IV insulin infusion has been started  °- Continue IVF, continue insulin infusion with frequent CBGs and serial chem panels   ° °2. Hyperkalemia  °- Potassium 6.9 without EKG changes  °- She was given IV bicarbonate in ED as well as IVF and insulin  °- Continue cardiac monitoring, IVF, IV insulin, and serial chem panels  ° °3. SIRS  °- Patient meets SIRS criteria with tachycardia, tachypnea, and leukocytosis but there is no fever or apparent infectious process and this is likely secondary to severe DKA  °- Treat DKA, monitor, culture if febrile  ° ° °DVT prophylaxis: Lovenox  °Code Status: Full  °Family Communication: Discussed with patient  °Disposition Plan:  °Patient is from: Home  °Anticipated d/c is to: Home  °Anticipated d/c   date is: 12/05/19 °Patient currently: In severe DKA  °Consults called: None  °Admission status: Inpatient   ° ° ° S , MD °[facility] ° °12/03/2019, 5:50 AM  ° ° °

## 2019-12-03 NOTE — Progress Notes (Signed)
PROGRESS NOTE    Judy Cook  ZOX:[id number] DOB: 2000/11/05 DOA: 12/02/2019 PCP: Wylene Men C, FNP   Brief Narrative:  19 year old with history of DM1 presents with hyperglycemia, nausea and vomiting.  Has not been taking her insulin at home, found to be in diabetic ketoacidosis.   Assessment & Plan:   Principal Problem:   DKA (diabetic ketoacidoses) (H[facility]) Active Problems:   SIRS (systemic inflammatory response syndrome) ([facility])   Hyperkalemia  Diabetic ketoacidosis, severe Diabetes mellitus type 1, medication noncompliance -patient is currently in severe DKA, currently running on Endo tool/insulin drip.  1 L normal saline bolus, continue maintenance IV fluid, once blood sugar less than 250 transition to D5 half-normal saline. -VBG ordered, will need bicarb drip for short period of time -periodic BMP.  She will need PICC line due to lack of IV access and requiring aggressive/intensive IV therapies. -Maintain n.p.o., if she started tolerating oral we will transition her to diabetic diet. -Antiemetic as needed -hemoglobin A1c 12.0.  Hyperkalemia -no acute EKG changes, should improve with fluids and insulin drip.  We will closely monitor.  SIRS -Secondary to severe dehydration and acidosis.  No obvious evidence of infection.  No need for antibiotics.   DVT prophylaxis: Lovenox Code Status: Full code Family Communication:    Status is: Inpatient  Remains inpatient appropriate because:Hemodynamically unstable   Dispo: The patient is from: Home              Anticipated d/c is to: Home              Anticipated d/c date is: 2 days              Patient currently is not medically stable to d/c.  Patient is in severe DKA with acidosis, needing hospital stay for IV therapies including fluids, bicarb drip, insulin drip.       Body mass index is 23.68 kg/m.      Subjective: Patient seen and examined at bedside, tells me she had not been taking her insulin for  past several days and is feeling slightly nauseous.  No other complaints at the moment.  Review of Systems Otherwise negative except as per HPI, including: General: Denies fever, chills, night sweats or unintended weight loss. Resp: Denies cough, wheezing, shortness of breath. Cardiac: Denies chest pain, palpitations, orthopnea, paroxysmal nocturnal dyspnea. GI: Denies abdominal pain,  vomiting, diarrhea or constipation GU: Denies dysuria, frequency, hesitancy or incontinence MS: Denies muscle aches, joint pain or swelling Neuro: Denies headache, neurologic deficits (focal weakness, numbness, tingling), abnormal gait Psych: Denies anxiety, depression, SI/HI/AVH Skin: Denies new rashes or lesions ID: Denies sick contacts, exotic exposures, travel  Examination:  Constitutional: Not in acute distress, dry mouth Respiratory: Clear to auscultation bilaterally Cardiovascular: Sinus tachycardia Abdomen: Nontender nondistended good bowel sounds Musculoskeletal: No edema noted Skin: No rashes seen Neurologic: CN 2-12 grossly intact.  And nonfocal Psychiatric: Normal judgment and insight. Alert and oriented x 3. Normal mood.   Objective: Vitals:   12/03/19 0600 12/03/19 0650 12/03/19 0733 12/03/19 0800  BP: 127/84 (!) 135/91 (!) 142/94 (!) 143/92  Pulse: (!) 117 (!) 123 (!) 124 (!) 128  Resp: (!) 26 (!) 28 (!) 28 (!) 28  Temp:   97.8 F (36.6 C)   TempSrc:      SpO2: 98% 100% 100% 100%  Weight:      Height:       No intake or output data in the 24 hours ending 12/03/19 0835 [employer]  12/02/19 1543  Weight: 55 kg     Data Reviewed:   CBC: Recent Labs  Lab 12/02/19 2326 12/02/19 2357 12/03/19 0301  WBC 33.1*  --   --   NEUTROABS  --  27.7*  --   HGB 14.5  --  16.7*  HCT 46.0  --  49.0*  MCV 99.6  --   --   PLT 435*  --   --    Basic Metabolic Panel: Recent Labs  Lab 12/03/19 0252 12/03/19 0301 12/03/19 0605  NA  --  129* 131*  K  --  6.9* 5.9*  CL  --   106 105  CO2  --   --  <7*  GLUCOSE  --  >700* 554*  BUN  --  40* 27*  CREATININE 1.00 1.00 1.26*  CALCIUM  --   --  7.8*   GFR: Estimated Creatinine Clearance: 55.4 mL/min (A) (by C-G formula based on SCr of 1.26 mg/dL (H)). Liver Function Tests: No results for input(s): AST, ALT, ALKPHOS, BILITOT, PROT, ALBUMIN in the last 168 hours. No results for input(s): LIPASE, AMYLASE in the last 168 hours. No results for input(s): AMMONIA in the last 168 hours. Coagulation Profile: No results for input(s): INR, PROTIME in the last 168 hours. Cardiac Enzymes: No results for input(s): CKTOTAL, CKMB, CKMBINDEX, TROPONINI in the last 168 hours. BNP (last 3 results) No results for input(s): PROBNP in the last 8760 hours. HbA1C: Recent Labs    12/03/19 0605  HGBA1C 12.0*   CBG: Recent Labs  Lab 12/03/19 0502 12/03/19 0541 12/03/19 0614 12/03/19 0647 12/03/19 0739  GLUCAP >600* 527* 462* 510* 484*   Lipid Profile: No results for input(s): CHOL, HDL, LDLCALC, TRIG, CHOLHDL, LDLDIRECT in the last 72 hours. Thyroid Function Tests: No results for input(s): TSH, T4TOTAL, FREET4, T3FREE, THYROIDAB in the last 72 hours. Anemia Panel: No results for input(s): VITAMINB12, FOLATE, FERRITIN, TIBC, IRON, RETICCTPCT in the last 72 hours. Sepsis Labs: No results for input(s): PROCALCITON, LATICACIDVEN in the last 168 hours.  Recent Results (from the past 240 hour(s))  SARS Coronavirus 2 by RT PCR (hospital order, performed in [facility] hospital lab) Nasopharyngeal Nasopharyngeal Swab     Status: None   Collection Time: 12/03/19  5:07 AM   Specimen: Nasopharyngeal Swab  Result Value Ref Range Status   SARS Coronavirus 2 NEGATIVE NEGATIVE Final    Comment: (NOTE) SARS-CoV-2 target nucleic acids are NOT DETECTED.  The SARS-CoV-2 RNA is generally detectable in upper and lower respiratory specimens during the acute phase of infection. The lowest concentration of SARS-CoV-2 viral copies this  assay can detect is 250 copies / mL. A negative result does not preclude SARS-CoV-2 infection and should not be used as the sole basis for treatment or other patient management decisions.  A negative result may occur with improper specimen collection / handling, submission of specimen other than nasopharyngeal swab, presence of viral mutation(s) within the areas targeted by this assay, and inadequate number of viral copies (<250 copies / mL). A negative result must be combined with clinical observations, patient history, and epidemiological information.  Fact Sheet for Patients:   BoilerBrush.com.cy  Fact Sheet for Healthcare Providers: https://pope.com/  This test is not yet approved or  cleared by the [place] FDA and has been authorized for detection and/or diagnosis of SARS-CoV-2 by FDA under an Emergency Use Authorization (EUA).  This EUA will remain in effect (meaning this test can be used) for the duration of  the COVID-19 declaration under Section 564(b)(1) of the Act, 21 U.S.C. section 360bbb-3(b)(1), unless the authorization is terminated or revoked sooner.  Performed at [facility], [address], [place], [place] [address]          Radiology Studies: No results found.      Scheduled Meds: . enoxaparin (LOVENOX) injection  40 mg Subcutaneous Q24H   Continuous Infusions: . sodium chloride Stopped (12/03/19 0530)  . dextrose 5 % and 0.45% NaCl Stopped (12/03/19 0530)  . famotidine (PEPCID) IV    . insulin 8.5 Units/hr (12/03/19 0648)     LOS: 0 days   Time spent= 40 mins    Kashten Gowin Joline Maxcy, MD [facility]  If 7PM-7AM, please contact night-coverage  12/03/2019, 8:35 AM

## 2019-12-03 NOTE — ED Notes (Signed)
Date and time results received: 12/03/19 6:52 AM  (use smartphrase ".now" to insert current time)  Test: Glucose Critical Value: 554  Name of Provider Notified: Dr. Nelson Chimes  Orders Received? Or Actions Taken?:

## 2019-12-04 LAB — CBC
HCT: 37 % (ref 36.0–46.0)
Hemoglobin: 13.1 g/dL (ref 12.0–15.0)
MCH: 31.8 pg (ref 26.0–34.0)
MCHC: 35.4 g/dL (ref 30.0–36.0)
MCV: 89.8 fL (ref 80.0–100.0)
Platelets: 282 10*3/uL (ref 150–400)
RBC: 4.12 MIL/uL (ref 3.87–5.11)
RDW: 12.6 % (ref 11.5–15.5)
WBC: 20.8 10*3/uL — ABNORMAL HIGH (ref 4.0–10.5)
nRBC: 0 % (ref 0.0–0.2)

## 2019-12-04 LAB — MAGNESIUM: Magnesium: 1.9 mg/dL (ref 1.7–2.4)

## 2019-12-04 LAB — BASIC METABOLIC PANEL
Anion gap: 8 (ref 5–15)
Anion gap: 8 (ref 5–15)
BUN: 14 mg/dL (ref 6–20)
BUN: 12 mg/dL (ref 6–20)
CO2: 14 mmol/L — ABNORMAL LOW (ref 22–32)
CO2: 18 mmol/L — ABNORMAL LOW (ref 22–32)
Calcium: 8.2 mg/dL — ABNORMAL LOW (ref 8.9–10.3)
Calcium: 8.6 mg/dL — ABNORMAL LOW (ref 8.9–10.3)
Chloride: 114 mmol/L — ABNORMAL HIGH (ref 98–111)
Chloride: 113 mmol/L — ABNORMAL HIGH (ref 98–111)
Creatinine, Ser: 0.86 mg/dL (ref 0.44–1.00)
Creatinine, Ser: 0.83 mg/dL (ref 0.44–1.00)
GFR calc Af Amer: >60 mL/min (ref >60)
GFR calc Af Amer: >60 mL/min (ref >60)
GFR calc non Af Amer: >60 mL/min (ref >60)
GFR calc non Af Amer: >60 mL/min (ref >60)
Glucose, Bld: 137 mg/dL — ABNORMAL HIGH (ref 70–99)
Glucose, Bld: 124 mg/dL — ABNORMAL HIGH (ref 70–99)
Potassium: 3.5 mmol/L (ref 3.5–5.1)
Potassium: 3.3 mmol/L — ABNORMAL LOW (ref 3.5–5.1)
Sodium: 136 mmol/L (ref 135–145)
Sodium: 139 mmol/L (ref 135–145)

## 2019-12-04 LAB — GLUCOSE, CAPILLARY
Glucose-Capillary: 101 mg/dL — ABNORMAL HIGH (ref 70–99)
Glucose-Capillary: 118 mg/dL — ABNORMAL HIGH (ref 70–99)
Glucose-Capillary: 123 mg/dL — ABNORMAL HIGH (ref 70–99)
Glucose-Capillary: 124 mg/dL — ABNORMAL HIGH (ref 70–99)
Glucose-Capillary: 131 mg/dL — ABNORMAL HIGH (ref 70–99)
Glucose-Capillary: 133 mg/dL — ABNORMAL HIGH (ref 70–99)
Glucose-Capillary: 133 mg/dL — ABNORMAL HIGH (ref 70–99)
Glucose-Capillary: 138 mg/dL — ABNORMAL HIGH (ref 70–99)
Glucose-Capillary: 147 mg/dL — ABNORMAL HIGH (ref 70–99)
Glucose-Capillary: 148 mg/dL — ABNORMAL HIGH (ref 70–99)
Glucose-Capillary: 158 mg/dL — ABNORMAL HIGH (ref 70–99)
Glucose-Capillary: 226 mg/dL — ABNORMAL HIGH (ref 70–99)

## 2019-12-04 LAB — BETA-HYDROXYBUTYRIC ACID: Beta-Hydroxybutyric Acid: 1.83 mmol/L — ABNORMAL HIGH (ref 0.05–0.27)

## 2019-12-04 MED ORDER — PROMETHAZINE HCL 25 MG/ML IJ SOLN
25.0000 mg | Freq: Four times a day (QID) | INTRAMUSCULAR | Status: DC | PRN
  Administered 2019-12-04 – 2019-12-08 (×12): 25 mg via INTRAVENOUS
  Filled 2019-12-04 (×12): qty 1

## 2019-12-04 MED ORDER — INSULIN ASPART 100 UNIT/ML ~~LOC~~ SOLN
3.0000 [IU] | Freq: Three times a day (TID) | SUBCUTANEOUS | Status: DC
  Administered 2019-12-04 – 2019-12-05 (×2): 3 [IU] via SUBCUTANEOUS

## 2019-12-04 MED ORDER — INSULIN ASPART 100 UNIT/ML ~~LOC~~ SOLN: 0.0000 [IU] | Freq: Every day | SUBCUTANEOUS | Status: DC

## 2019-12-04 MED ORDER — POTASSIUM CHLORIDE CRYS ER 20 MEQ PO TBCR
40.0000 meq | EXTENDED_RELEASE_TABLET | Freq: Two times a day (BID) | ORAL | Status: DC
  Administered 2019-12-04: 40 meq via ORAL
  Filled 2019-12-04: qty 2

## 2019-12-04 MED ORDER — POTASSIUM CHLORIDE CRYS ER 20 MEQ PO TBCR
40.0000 meq | EXTENDED_RELEASE_TABLET | ORAL | Status: AC
  Administered 2019-12-04: 40 meq via ORAL
  Filled 2019-12-04 (×2): qty 2

## 2019-12-04 MED ORDER — INSULIN GLARGINE 100 UNIT/ML ~~LOC~~ SOLN
20.0000 [IU] | Freq: Every day | SUBCUTANEOUS | Status: DC
  Administered 2019-12-04 – 2019-12-06 (×3): 20 [IU] via SUBCUTANEOUS
  Filled 2019-12-04 (×4): qty 0.2

## 2019-12-04 MED ORDER — KETOROLAC TROMETHAMINE 30 MG/ML IJ SOLN
30.0000 mg | Freq: Four times a day (QID) | INTRAMUSCULAR | Status: DC | PRN
  Administered 2019-12-04 – 2019-12-07 (×10): 30 mg via INTRAVENOUS
  Filled 2019-12-04 (×12): qty 1

## 2019-12-04 MED ORDER — SODIUM CHLORIDE 0.9 % IV BOLUS
1000.0000 mL | Freq: Once | INTRAVENOUS | Status: AC
  Administered 2019-12-04: 1000 mL via INTRAVENOUS

## 2019-12-04 MED ORDER — INSULIN ASPART 100 UNIT/ML ~~LOC~~ SOLN
0.0000 [IU] | Freq: Three times a day (TID) | SUBCUTANEOUS | Status: DC
  Administered 2019-12-04: 2 [IU] via SUBCUTANEOUS
  Administered 2019-12-04: 5 [IU] via SUBCUTANEOUS
  Administered 2019-12-04: 18:00:00 2 [IU] via SUBCUTANEOUS
  Administered 2019-12-05: 08:00:00 15 [IU] via SUBCUTANEOUS
  Administered 2019-12-05: 5 [IU] via SUBCUTANEOUS
  Administered 2019-12-06 (×2): 3 [IU] via SUBCUTANEOUS
  Administered 2019-12-06: 8 [IU] via SUBCUTANEOUS
  Administered 2019-12-07: 12:00:00 3 [IU] via SUBCUTANEOUS
  Administered 2019-12-07: 11 [IU] via SUBCUTANEOUS
  Administered 2019-12-08: 3 [IU] via SUBCUTANEOUS

## 2019-12-04 NOTE — Progress Notes (Signed)
PROGRESS NOTE    Judy Cook  ION:[id number] DOB: 10/13/2000 DOA: 12/02/2019 PCP: Wylene Men C, FNP   Brief Narrative:  19 year old with history of DM1 presents with hyperglycemia, nausea and vomiting.  Has not been taking her insulin at home, found to be in diabetic ketoacidosis.  Initially patient required aggressive IV fluids, bicarb drip.  Over the course of 36 hours this was weaned off and transition to subcutaneous insulin.  She was initially quite tachycardic as well requiring IV fluids due to significant amount of dehydration.   Assessment & Plan:   Principal Problem:   DKA (diabetic ketoacidoses) (HCC) Active Problems:   SIRS (systemic inflammatory response syndrome) (HCC)   Hyperkalemia  Diabetic ketoacidosis, severe-resolved Diabetes mellitus type 1, medication noncompliance -DKA is resolved, transitioning patient to subcutaneous insulin Lantus 20 units daily with sliding scale and premeal NovoLog 3 units.  Insulin drip has been weaned off. -Advance diet as tolerated, still having quite a bit of nausea.  Phenergan has been added.  Once her diabetic/p.o. intake is consistent, IV fluids can be stopped. -Continue Accu-Cheks at this time. -Antiemetic as needed -hemoglobin A1c 12.0.  SIRS Severe dehydration, improving.  Sinus tachycardia -Dehydration status slowly seems to be improving.  Still having dry mouth therefore will give 1 L of normal saline bolus, continue maintenance fluid.  Once p.o. intake improves, this can be stopped.   DVT prophylaxis: Lovenox Code Status: Full code Family Communication:    Status is: Inpatient  Remains inpatient appropriate because:Hemodynamically unstable   Dispo: The patient is from: Home              Anticipated d/c is to: Home              Anticipated d/c date is: 1 day              Patient currently is not medically stable to d/c.  Hopefully we will transition her home tomorrow.  Currently we can transfer her to  MedSurg unit.  Maintain hospital stay due to clinical signs of dehydration, ongoing nausea.  Body mass index is 24.63 kg/m.      Subjective: Feels slightly better compared to yesterday but still having quite a bit of nausea and intermittent abdominal cramping.  Tells me abdominal cramping feels like her menstruation.  Feels slightly better compared to yesterday.  Examination:  Constitutional: Not in acute distress, dry mouth Respiratory: Clear to auscultation bilaterally Cardiovascular: Sinus tachycardia Abdomen: Nontender nondistended good bowel sounds Musculoskeletal: No edema noted Skin: No rashes seen Neurologic: CN 2-12 grossly intact.  And nonfocal Psychiatric: Normal judgment and insight. Alert and oriented x 3. Normal mood.  Objective: Vitals:   12/04/19 0600 12/04/19 0700 12/04/19 0800 12/04/19 1142  BP: (!) 142/77 (!) 125/93 123/76 130/88  Pulse: (!) 105 (!) 101 (!) 119 (!) 101  Resp: (!) 30 19 (!) 24 20  Temp:  98.5 F (36.9 C)  98.9 F (37.2 C)  TempSrc:  Oral  Oral  SpO2: 98% 100% 100% 100%  Weight:      Height:        Intake/Output Summary (Last 24 hours) at 12/04/2019 1146 Last data filed at 12/04/2019 0800 Gross per 24 hour  Intake 3433.78 ml  Output 1700 ml  Net 1733.78 ml   Filed Weights   12/02/19 1543 12/03/19 0930  Weight: 55 kg 57.2 kg     Data Reviewed:   CBC: Recent Labs  Lab 12/02/19 2326 12/02/19 2357 12/03/19 0301 12/04/19 0539  WBC 33.1*  --   --  20.8*  NEUTROABS  --  27.7*  --   --   HGB 14.5  --  16.7* 13.1  HCT 46.0  --  49.0* 37.0  MCV 99.6  --   --  89.8  PLT 435*  --   --  282   Basic Metabolic Panel: Recent Labs  Lab 12/03/19 1623 12/03/19 1744 12/03/19 2112 12/04/19 0159 12/04/19 0539  NA 134* 135 136 136 139  K 3.4* 3.4* 3.7 3.5 3.3*  CL 113* 113* 114* 114* 113*  CO2 9* 10* 13* 14* 18*  GLUCOSE 215* 223* 131* 137* 124*  BUN 20 19 15 14 12   CREATININE 0.98 0.87 0.77 0.86 0.83  CALCIUM 7.6* 7.7* 7.9*  8.2* 8.6*  MG  --   --   --   --  1.9   GFR: Estimated Creatinine Clearance: 85.7 mL/min (by C-G formula based on SCr of 0.83 mg/dL). Liver Function Tests: No results for input(s): AST, ALT, ALKPHOS, BILITOT, PROT, ALBUMIN in the last 168 hours. No results for input(s): LIPASE, AMYLASE in the last 168 hours. No results for input(s): AMMONIA in the last 168 hours. Coagulation Profile: No results for input(s): INR, PROTIME in the last 168 hours. Cardiac Enzymes: No results for input(s): CKTOTAL, CKMB, CKMBINDEX, TROPONINI in the last 168 hours. BNP (last 3 results) No results for input(s): PROBNP in the last 8760 hours. HbA1C: Recent Labs    12/03/19 0605  HGBA1C 12.0*   CBG: Recent Labs  Lab 12/04/19 0456 12/04/19 0539 12/04/19 0643 12/04/19 0808 12/04/19 0935  GLUCAP 133* 101* 147* 158* 131*   Lipid Profile: No results for input(s): CHOL, HDL, LDLCALC, TRIG, CHOLHDL, LDLDIRECT in the last 72 hours. Thyroid Function Tests: No results for input(s): TSH, T4TOTAL, FREET4, T3FREE, THYROIDAB in the last 72 hours. Anemia Panel: No results for input(s): VITAMINB12, FOLATE, FERRITIN, TIBC, IRON, RETICCTPCT in the last 72 hours. Sepsis Labs: No results for input(s): PROCALCITON, LATICACIDVEN in the last 168 hours.  Recent Results (from the past 240 hour(s))  SARS Coronavirus 2 by RT PCR (hospital order, performed in [facility] hospital lab) Nasopharyngeal Nasopharyngeal Swab     Status: None   Collection Time: 12/03/19  5:07 AM   Specimen: Nasopharyngeal Swab  Result Value Ref Range Status   SARS Coronavirus 2 NEGATIVE NEGATIVE Final    Comment: (NOTE) SARS-CoV-2 target nucleic acids are NOT DETECTED.  The SARS-CoV-2 RNA is generally detectable in upper and lower respiratory specimens during the acute phase of infection. The lowest concentration of SARS-CoV-2 viral copies this assay can detect is 250 copies / mL. A negative result does not preclude SARS-CoV-2 infection and  should not be used as the sole basis for treatment or other patient management decisions.  A negative result may occur with improper specimen collection / handling, submission of specimen other than nasopharyngeal swab, presence of viral mutation(s) within the areas targeted by this assay, and inadequate number of viral copies (<250 copies / mL). A negative result must be combined with clinical observations, patient history, and epidemiological information.  Fact Sheet for Patients:   02/02/20  Fact Sheet for Healthcare Providers: BoilerBrush.com.cy  This test is not yet approved or  cleared by the https://pope.com/ FDA and has been authorized for detection and/or diagnosis of SARS-CoV-2 by FDA under an Emergency Use Authorization (EUA).  This EUA will remain in effect (meaning this test can be used) for the duration of the COVID-19 declaration  under Section 564(b)(1) of the Act, 21 U.S.C. section 360bbb-3(b)(1), unless the authorization is terminated or revoked sooner.  Performed at [facility], [address] [place], [place] [address]   MRSA PCR Screening     Status: None   Collection Time: 12/03/19  9:31 AM   Specimen: Nasopharyngeal  Result Value Ref Range Status   MRSA by PCR NEGATIVE NEGATIVE Final    Comment:        The GeneXpert MRSA Assay (FDA approved for NASAL specimens only), is one component of a comprehensive MRSA colonization surveillance program. It is not intended to diagnose MRSA infection nor to guide or monitor treatment for MRSA infections. Performed at [facility], [address] [place], [place] [address]          Radiology Studies: [place] EKG SITE RITE  Result Date: 12/03/2019 If Site Rite image not attached, placement could not be confirmed due to current cardiac rhythm.       Scheduled Meds: . Chlorhexidine Gluconate Cloth  6 each Topical  Daily  . enoxaparin (LOVENOX) injection  40 mg Subcutaneous Q24H  . insulin aspart  0-15 Units Subcutaneous TID WC  . insulin aspart  0-5 Units Subcutaneous QHS  . insulin aspart  3 Units Subcutaneous TID WC  . insulin glargine  20 Units Subcutaneous Daily  . potassium chloride  40 mEq Oral Q4H  . sodium chloride flush  10-40 mL Intracatheter Q12H   Continuous Infusions: . dextrose 5 % and 0.45% NaCl 100 mL/hr at 12/04/19 0800  . famotidine (PEPCID) IV Stopped (12/04/19 1008)  . insulin 3.4 mL/hr at 12/04/19 0800     LOS: 1 day   Time spent= 40 mins    Judy Fizer Joline Maxcy, MD [facility]  If 7PM-7AM, please contact night-coverage  12/04/2019, 11:46 AM

## 2019-12-04 NOTE — Progress Notes (Signed)
Dr. Nelson Chimes notified that patient vomited 300 ml. No nausea after vomiting. Pain 5/10. Stated "I'm ok for now". No new orders.

## 2019-12-04 NOTE — Progress Notes (Signed)
Inpatient Diabetes Program Recommendations  AACE/ADA: New Consensus Statement on Inpatient Glycemic Control (2015)  Target Ranges:  Prepandial:   less than 140 mg/dL      Peak postprandial:   less than 180 mg/dL (1-2 hours)      Critically ill patients:  140 - 180 mg/dL   Lab Results  Component Value Date   GLUCAP 158 (H) 12/04/2019   HGBA1C 12.0 (H) 12/03/2019    Review of Glycemic Control  Diabetes history: DM1 Outpatient Diabetes medications: Lantus 23 units QHS, Novolog 12 units tidwc Current orders for Inpatient glycemic control: Lantus 20 units QD, Novolog 0-15 units tidwc and hs + 3 units tidwc  HgbA1C - 12%  Inpatient Diabetes Program Recommendations:     Did not get to speak with pt yesterday, as she was getting central line placed. Will speak with her today. Pt had been out of town and left insulin. Had been 3 days without Lantus. Was trying to get by with just Novolog. Admitted for DKA.  Decrease Novolog to 0-9 units tidwc and hs (Type 1 and sensitive to insulin)  Increase meal coverage to 6 units tidwc for meal coverage insulin if pt eats > 50% meal.  Follow closely. Will discuss HgbA1C of 12% with pt.  Thank you. Ailene Ards, RD, LDN, CDE Inpatient Diabetes Coordinator [phone]

## 2019-12-05 LAB — BASIC METABOLIC PANEL
Anion gap: 10 (ref 5–15)
BUN: 8 mg/dL (ref 6–20)
CO2: 19 mmol/L — ABNORMAL LOW (ref 22–32)
Calcium: 8.4 mg/dL — ABNORMAL LOW (ref 8.9–10.3)
Chloride: 107 mmol/L (ref 98–111)
Creatinine, Ser: 0.75 mg/dL (ref 0.44–1.00)
GFR calc Af Amer: >60 mL/min (ref >60)
GFR calc non Af Amer: >60 mL/min (ref >60)
Glucose, Bld: 245 mg/dL — ABNORMAL HIGH (ref 70–99)
Potassium: 3.3 mmol/L — ABNORMAL LOW (ref 3.5–5.1)
Sodium: 136 mmol/L (ref 135–145)

## 2019-12-05 LAB — CBC
HCT: 33.7 % — ABNORMAL LOW (ref 36.0–46.0)
Hemoglobin: 11.8 g/dL — ABNORMAL LOW (ref 12.0–15.0)
MCH: 31.5 pg (ref 26.0–34.0)
MCHC: 35 g/dL (ref 30.0–36.0)
MCV: 89.9 fL (ref 80.0–100.0)
Platelets: 236 10*3/uL (ref 150–400)
RBC: 3.75 MIL/uL — ABNORMAL LOW (ref 3.87–5.11)
RDW: 13.1 % (ref 11.5–15.5)
WBC: 13.3 10*3/uL — ABNORMAL HIGH (ref 4.0–10.5)
nRBC: 0 % (ref 0.0–0.2)

## 2019-12-05 LAB — GLUCOSE, CAPILLARY
Glucose-Capillary: 368 mg/dL — ABNORMAL HIGH (ref 70–99)
Glucose-Capillary: 237 mg/dL — ABNORMAL HIGH (ref 70–99)
Glucose-Capillary: 117 mg/dL — ABNORMAL HIGH (ref 70–99)
Glucose-Capillary: 88 mg/dL (ref 70–99)

## 2019-12-05 LAB — MAGNESIUM: Magnesium: 2.1 mg/dL (ref 1.7–2.4)

## 2019-12-05 MED ORDER — SODIUM CHLORIDE 0.9 % IV SOLN: INTRAVENOUS | Status: DC

## 2019-12-05 MED ORDER — INSULIN ASPART 100 UNIT/ML ~~LOC~~ SOLN
6.0000 [IU] | Freq: Three times a day (TID) | SUBCUTANEOUS | Status: DC
  Administered 2019-12-05 (×2): 6 [IU] via SUBCUTANEOUS

## 2019-12-05 MED ORDER — ONDANSETRON HCL 4 MG/2ML IJ SOLN
4.0000 mg | Freq: Four times a day (QID) | INTRAMUSCULAR | Status: DC | PRN
  Administered 2019-12-06: 4 mg via INTRAVENOUS
  Filled 2019-12-05: qty 2

## 2019-12-05 MED ORDER — POTASSIUM CHLORIDE CRYS ER 20 MEQ PO TBCR
20.0000 meq | EXTENDED_RELEASE_TABLET | Freq: Once | ORAL | Status: AC
  Administered 2019-12-05: 20 meq via ORAL
  Filled 2019-12-05: qty 1

## 2019-12-05 MED ORDER — METOCLOPRAMIDE HCL 5 MG/ML IJ SOLN
10.0000 mg | Freq: Once | INTRAMUSCULAR | Status: AC
  Administered 2019-12-05: 08:00:00 10 mg via INTRAVENOUS
  Filled 2019-12-05: qty 2

## 2019-12-05 NOTE — Progress Notes (Signed)
Inpatient Diabetes Program Recommendations  AACE/ADA: New Consensus Statement on Inpatient Glycemic Control (2015)  Target Ranges:  Prepandial:   less than 140 mg/dL      Peak postprandial:   less than 180 mg/dL (1-2 hours)      Critically ill patients:  140 - 180 mg/dL   Lab Results  Component Value Date   GLUCAP 368 (H) 12/05/2019   HGBA1C 12.0 (H) 12/03/2019    Review of Glycemic Control  Diabetes history: DM1 Outpatient Diabetes medications: Lantus 23 units QHS, Novolog 12 units tidwc Current orders for Inpatient glycemic control: Lantus 20 units QD, Novolog 0-15 units tidwc and hs + 3 units tidwc  HgbA1C - 12% - poor glycemic control FBS this am 368 mg/dL   Inpatient Diabetes Program Recommendations:     Increase Lantus to 24 units QD Increase Novolog to 6 units tidwc for meal coverage insulin  Spoke with pt at length regarding HgbA1C of 12% and goal of 7%. Also discussed importance of keeping insulin with you when traveling, and if by chance she finds herself without insulin, instructed to go to [employer] and purchase Novolin 70/30 insulin. Pt needs PCP here in [place]. This is her biggest issue. Will order [facility] consult for assistance with obtaining PCP. Seems very knowledgeable about her diabetes, was diagnosed at age 8 and this is first hospitalization for DKA.  Continue to follow.  Thank you. Ailene Ards, RD, LDN, CDE Inpatient Diabetes Coordinator [phone]

## 2019-12-05 NOTE — TOC Progression Note (Signed)
Transition of Care [facility]) - Progression Note    Patient Details  Name: Judy Cook MRN: [id number] Date of Birth: 11/25/2000  [facility] CM/SW Contact  Lennart Pall, LCSW Phone Number: 12/05/2019, 12:25 PM  Clinical Narrative:    Met with pt to review need for primary PCP.  Pt has both [employer] and medicaid coverages.  She has moved to [place] and living with her sister and roommate.  Have provided pt with listing of local MDs, in her [facility] network who are taking new patients.  She understands need to chose one and make an appointment ASAP.  No further needs at this time.   Expected Discharge Plan: Home/Self Care Barriers to Discharge: Continued Medical Work up  Expected Discharge Plan and Services Expected Discharge Plan: Home/Self Care In-house Referral: Clinical Social Work     Living arrangements for the past 2 months: Apartment                 DME Arranged: N/A DME Agency: NA       HH Arranged: NA [place] Agency: NA         Social Determinants of Health (SDOH) Interventions    Readmission Risk Interventions Readmission Risk Prevention Plan 12/05/2019  Post Dischage Appt Complete  Medication Screening Complete  Transportation Screening Complete

## 2019-12-05 NOTE — Discharge Summary (Addendum)
Physician Discharge Summary  Judy Cook PIR:[id number] DOB: Sep 12, 2000 DOA: 12/02/2019  PCP: Ivory Broad C, FNP  Admit date: 12/02/2019 Discharge date: 12/08/2019  Admitted From: home Disposition:  home  Recommendations for Outpatient Follow-up:  1. Follow up with PCP in 1-2 weeks 2. Please obtain BMP/CBC in one week 3. Please follow up on the following pending results:  Home Health:no  Equipment/Devices: None  Discharge Condition: Stable Code Status: full Diet recommendation:  Diet Order            Diet Carb Modified           Diet Carb Modified Fluid consistency: Thin; Room service appropriate? Yes  Diet effective now                  Brief/Interim Summary:  19 year old with history of DM1 presents with hyperglycemia, nausea and vomiting.  Has not been taking her insulin at home, found to be in diabetic ketoacidosis.  Initially patient required aggressive IV fluids, bicarb drip. Over the course of 36 hours this was weaned off and transition to subcutaneous insulin.  She was initially quite tachycardic as well requiring IV fluids due to significant amount of dehydration. Patient was treated in the medical floor, she was placed Cook on long-acting and short-acting insulin, with improvement in the blood sugar.  Patient had episode of vomiting and nausea so she was kept in the hospital for 2 additional days.  At this time symptoms improved she will be going home on Protonix, Reglan and as needed Zofran.  Feels well today blood sugar at 180s and would like to go home today.  Did offer her additional day if she felt uncomfortable about being able to tolerate diet.  She did tolerate lunch today.    Discharge Diagnoses:  DKA (diabetic ketoacidoses) severe, resolved.  Patient will continue on Lantus 23 units daily along with 12 units Premeal NovoLog FlexPen.  She will follow up with PCP, TOC consulted to arrange for PCP.  Community clinic number provided.  A1c  12.0. SIRS/severe dehydration, no evidence of sepsis or infection.  Dehydration resolved. Dehydration resolved with IV fluids.  Lifting diet. Will pain mostly in the left upper quadrant.  Symptoms overall improved, tolerating diet.  CT scan of the abdomen with contrast shows apparent mild wall thickening of the large bowel from hepatic flexure to the proximal descending colon, may represent an infectious or inflammatory colitis.  Patient does not have any diarrhea, has regular bowel movement.  Symptoms were improved and abdomen is benign and nontender.  She is advised to follow-up with PCP if she has recurrent issues she will need GI follow-up Hypokalemia was replaced.  She will go on few more days of potassium chloride supplementation  Consults:  DM Coordinator  Subjective: Ambulating in the room.  Ate some breakfast this morning and waiting to see if she can tolerate more food and after that planning for home Discharge Exam: Vitals:   12/08/19 0933 12/08/19 1253  BP: (!) 139/94 (!) 141/99  Pulse: 92 79  Resp:  18  Temp: 99.3 F (37.4 C) 97.6 F (36.4 C)  SpO2: 100% 100%   General: Pt is alert, awake, not in acute distress Cardiovascular: RRR, S1/S2 +, no rubs, no gallops Respiratory: CTA bilaterally, no wheezing, no rhonchi Abdominal: Soft, NT, ND, bowel sounds + Extremities: no edema, no cyanosis  Discharge Instructions  Discharge Instructions    Diet Carb Modified   Complete by: As directed    Discharge instructions  Complete by: As directed    Check blood sugar 3 times a day and bedtime at home. If blood sugar running above 200 less than 70 please call your MD to adjust insulin. If blood sugars running less 100 do not use insulin and call MD. If you noticed signs and symptoms of hypoglycemia or low blood sugar like jitteriness, confusion, thirst, tremor, sweating- Check blood sugar, drink sugary drink/biscuits/sweets to increase sugar level and call MD or return to  ER.  Please call call MD or return to ER for similar or worsening recurring problem that brought you to hospital or if any fever,nausea/vomiting,abdominal pain, uncontrolled pain, chest pain,  shortness of breath or any other alarming symptoms.  Please follow-up your doctor as instructed in a week time and call the office for appointment.  Please avoid alcohol, smoking, or any other illicit substance and maintain healthy habits including taking your regular medications as prescribed.  You were cared for by a hospitalist during your hospital stay. If you have any questions about your discharge medications or the care you received while you were in the hospital after you are discharged, you can call the unit and ask to speak with the hospitalist on call if the hospitalist that took care of you is not available.  Once you are discharged, your primary care physician will handle any further medical issues. Please note that NO REFILLS for any discharge medications will be authorized once you are discharged, as it is imperative that you return to your primary care physician (or establish a relationship with a primary care physician if you do not have one) for your aftercare needs so that they can reassess your need for medications and monitor your lab values   Discharge instructions   Complete by: As directed    Please call call MD or return to ER for similar or worsening recurring problem that brought you to hospital or if any fever,nausea/vomiting,abdominal pain, uncontrolled pain, chest pain,  shortness of breath or any other alarming symptoms.  Please follow-up your doctor as instructed in a week time and call the office for appointment.  Please avoid alcohol, smoking, or any other illicit substance and maintain healthy habits including taking your regular medications as prescribed.  You were cared for by a hospitalist during your hospital stay. If you have any questions about your discharge  medications or the care you received while you were in the hospital after you are discharged, you can call the unit and ask to speak with the hospitalist on call if the hospitalist that took care of you is not available.  Check blood sugar 3 times a day and bedtime at home. If blood sugar running above 200 less than 70 please call your MD to adjust insulin. If blood sugars running less 100 do not use insulin and call MD. If you noticed signs and symptoms of hypoglycemia or low blood sugar like jitteriness, confusion, thirst, tremor, sweating- Check blood sugar, drink sugary drink/biscuits/sweets to increase sugar level and call MD or return to ER.  Once you are discharged, your primary care physician will handle any further medical issues. Please note that NO REFILLS for any discharge medications will be authorized once you are discharged, as it is imperative that you return to your primary care physician (or establish a relationship with a primary care physician if you do not have one) for your aftercare needs so that they can reassess your need for medications and monitor your lab values  Increase activity slowly   Complete by: As directed    Increase activity slowly   Complete by: As directed      Allergies as of 12/08/2019   No Known Allergies     Medication List    TAKE these medications   blood glucose meter kit and supplies Dispense based on patient and insurance preference. Use up to four times daily as directed. (FOR ICD-10 E10.9, E11.9).   Lantus SoloStar 100 UNIT/ML Solostar Pen Generic drug: insulin glargine Inject 23 Units into the skin at bedtime.   metoCLOPramide 10 MG tablet Commonly known as: REGLAN Take 1 tablet (10 mg total) by mouth 3 (three) times daily with meals for 7 days.   NovoLOG FlexPen 100 UNIT/ML FlexPen Generic drug: insulin aspart Inject 12 Units into the skin 3 (three) times daily before meals. Ratio/sliding scale ( Adjust carb)   ondansetron 4 MG  tablet Commonly known as: Zofran Take 1 tablet (4 mg total) by mouth every 8 (eight) hours as needed for nausea or vomiting.   pantoprazole 40 MG tablet Commonly known as: Protonix Take 1 tablet (40 mg total) by mouth daily.   Potassium Chloride ER 20 MEQ Tbcr Take 20 mEq by mouth daily for 7 doses.       Follow-up Information    Ivory Broad C, FNP Follow up in 1 week(s).   Specialty: Family Medicine Contact information: [place]  [id number] [place] Follow up.   Why: Call the wellness center if unable to see your PCP Contact information: [address] [place] [address] [phone]             No Known Allergies  The results of significant diagnostics from this hospitalization (including imaging, microbiology, ancillary and laboratory) are listed below for reference.    Microbiology: Recent Results (from the past 240 hour(s))  SARS Coronavirus 2 by RT PCR (hospital order, performed in [facility] hospital lab) Nasopharyngeal Nasopharyngeal Swab     Status: None   Collection Time: 12/03/19  5:07 AM   Specimen: Nasopharyngeal Swab  Result Value Ref Range Status   SARS Coronavirus 2 NEGATIVE NEGATIVE Final    Comment: (NOTE) SARS-CoV-2 target nucleic acids are NOT DETECTED.  The SARS-CoV-2 RNA is generally detectable in upper and lower respiratory specimens during the acute phase of infection. The lowest concentration of SARS-CoV-2 viral copies this assay can detect is 250 copies / mL. A negative result does not preclude SARS-CoV-2 infection and should not be used as the sole basis for treatment or other patient management decisions.  A negative result may occur with improper specimen collection / handling, submission of specimen other than nasopharyngeal swab, presence of viral mutation(s) within the areas targeted by this assay, and inadequate number of viral  copies (<250 copies / mL). A negative result must be combined with clinical observations, patient history, and epidemiological information.  Fact Sheet for Patients:   StrictlyIdeas.no  Fact Sheet for Healthcare Providers: BankingDealers.co.za  This test is not yet approved or  cleared by the [place] FDA and has been authorized for detection and/or diagnosis of SARS-CoV-2 by FDA under an Emergency Use Authorization (EUA).  This EUA will remain in effect (meaning this test can be used) for the duration of the COVID-19 declaration under Section 564(b)(1) of the Act, 21 U.S.C. section 360bbb-3(b)(1), unless the authorization is terminated or revoked sooner.  Performed at [facility]  [place] [address], [place], [place] [address]   MRSA PCR Screening     Status: None   Collection Time: 12/03/19  9:31 AM   Specimen: Nasopharyngeal  Result Value Ref Range Status   MRSA by PCR NEGATIVE NEGATIVE Final    Comment:        The GeneXpert MRSA Assay (FDA approved for NASAL specimens only), is one component of a comprehensive MRSA colonization surveillance program. It is not intended to diagnose MRSA infection nor to guide or monitor treatment for MRSA infections. Performed at [facility], [place] [address], [place], [place] [address]     Procedures/Studies: CT ABDOMEN PELVIS W CONTRAST  Result Date: 12/06/2019 CLINICAL DATA:  Nausea vomiting with left upper quadrant abdominal pain. EXAM: CT ABDOMEN AND PELVIS WITH CONTRAST TECHNIQUE: Multidetector CT imaging of the abdomen and pelvis was performed using the standard protocol following bolus administration of intravenous contrast. CONTRAST:  185m OMNIPAQUE IOHEXOL 300 MG/ML  SOLN COMPARISON:  None. FINDINGS: Lower chest: No acute abnormality. Hepatobiliary: Hypoattenuation along the falciform ligament likely represents focal fat. No gallstones,  gallbladder wall thickening, or biliary dilatation. Pancreas: Unremarkable. No pancreatic ductal dilatation or surrounding inflammatory changes. Spleen: Normal in size without focal abnormality. Adrenals/Urinary Tract: Adrenal glands are unremarkable. Kidneys are normal, without renal calculi, focal lesion, or hydronephrosis. Bladder is unremarkable. Stomach/Bowel: Stomach is within normal limits. Appendix appears normal. There appears to be mild wall thickening of the large bowel from the hepatic flexure to the proximal descending colon, however the segment of bowel is not well distended and evaluation of wall thickening is limited. No significant associated fat stranding. No evidence of bowel obstruction. Vascular/Lymphatic: No significant vascular findings are present. No enlarged abdominal or pelvic lymph nodes. Reproductive: Uterus and bilateral adnexa are unremarkable. Other: No abdominal wall hernia or abnormality. No abdominopelvic ascites. Gas in the subcutaneous fat of the anterior abdominal wall is likely related to medication injections. Musculoskeletal: No acute or significant osseous findings. IMPRESSION: Apparent mild wall thickening of the large bowel from the hepatic flexure to the proximal descending colon may represent an infectious or inflammatory colitis. Electronically Signed   By: TZerita BoersM.D.   On: 12/06/2019 15:17   [place]KG SITE RITE  Result Date: 12/03/2019 If Site Rite image not attached, placement could not be confirmed due to current cardiac rhythm.   Labs: BNP (last 3 results) No results for input(s): BNP in the last 8760 hours. Basic Metabolic Panel: Recent Labs  Lab 12/04/19 0539 12/04/19 0539 12/05/19 0345 12/05/19 0345 12/06/19 0350 12/07/19 0448 12/07/19 1300 12/08/19 0557 12/08/19 1537  NA 139   < > 136   < > 136 135 135 134* 136  K 3.3*   < > 3.3*   < > 3.2* 3.7 3.1* 3.1* 3.4*  CL 113*   < > 107   < > 105 103 109 108 109  CO2 18*   < > 19*   < > 20*  16* 16* 14* 17*  GLUCOSE 124*   < > 245*   < > 177* 348* 184* 199* 106*  BUN 12   < > 8   < > _0 <5*  CREATININE 0.83   < > 0.75   < > 0.64 0.74 0.69 0.68 0.60  CALCIUM 8.6*   < > 8.4*   < > 8.0* 8.3* 8.2* 8.1* 8.2*  MG 1.9  --  2.1  --   --   --   --   --   --    < > =  values in this interval not displayed.   Liver Function Tests: Recent Labs  Lab 12/07/19 0448 12/08/19 0557  AST 16 19  ALT 22 22  ALKPHOS 63 61  BILITOT 1.3* 0.9  PROT 6.3* 6.2*  ALBUMIN 3.5 3.4*   No results for input(s): LIPASE, AMYLASE in the last 168 hours. No results for input(s): AMMONIA in the last 168 hours. CBC: Recent Labs  Lab 12/02/19 2326 12/02/19 2326 12/02/19 2357 12/03/19 0301 12/04/19 0539 12/05/19 0345 12/07/19 0448 12/08/19 0557  WBC 33.1*  --   --   --  20.8* 13.3* 5.8 7.7  NEUTROABS  --   --  27.7*  --   --   --   --   --   HGB 14.5   < >  --  16.7* 13.1 11.8* 11.5* 11.3*  HCT 46.0   < >  --  49.0* 37.0 33.7* 33.7* 32.3*  MCV 99.6  --   --   --  89.8 89.9 92.3 90.5  PLT 435*  --   --   --  282 236 250 330   < > = values in this interval not displayed.   Cardiac Enzymes: No results for input(s): CKTOTAL, CKMB, CKMBINDEX, TROPONINI in the last 168 hours. BNP: Invalid input(s): POCBNP CBG: Recent Labs  Lab 12/07/19 1650 12/07/19 2137 12/08/19 0733 12/08/19 1117 12/08/19 1623  GLUCAP 114* 153* 179* 93 84   D-Dimer No results for input(s): DDIMER in the last 72 hours. Hgb A1c No results for input(s): HGBA1C in the last 72 hours. Lipid Profile No results for input(s): CHOL, HDL, LDLCALC, TRIG, CHOLHDL, LDLDIRECT in the last 72 hours. Thyroid function studies No results for input(s): TSH, T4TOTAL, T3FREE, THYROIDAB in the last 72 hours.  Invalid input(s): FREET3 Anemia work up No results for input(s): VITAMINB12, FOLATE, FERRITIN, TIBC, IRON, RETICCTPCT in the last 72 hours. Urinalysis    Component Value Date/Time   COLORURINE YELLOW 12/02/2019 2339    APPEARANCEUR CLEAR 12/02/2019 2339   LABSPEC 1.017 12/02/2019 2339   PHURINE 5.0 12/02/2019 2339   GLUCOSEU >=500 (A) 12/02/2019 2339   HGBUR LARGE (A) 12/02/2019 2339   BILIRUBINUR NEGATIVE 12/02/2019 2339   KETONESUR 80 (A) 12/02/2019 2339   PROTEINUR 100 (A) 12/02/2019 2339   NITRITE NEGATIVE 12/02/2019 2339   LEUKOCYTESUR NEGATIVE 12/02/2019 2339   Sepsis Labs Invalid input(s): PROCALCITONIN,  WBC,  LACTICIDVEN Microbiology Recent Results (from the past 240 hour(s))  SARS Coronavirus 2 by RT PCR (hospital order, performed in [facility] hospital lab) Nasopharyngeal Nasopharyngeal Swab     Status: None   Collection Time: 12/03/19  5:07 AM   Specimen: Nasopharyngeal Swab  Result Value Ref Range Status   SARS Coronavirus 2 NEGATIVE NEGATIVE Final    Comment: (NOTE) SARS-CoV-2 target nucleic acids are NOT DETECTED.  The SARS-CoV-2 RNA is generally detectable in upper and lower respiratory specimens during the acute phase of infection. The lowest concentration of SARS-CoV-2 viral copies this assay can detect is 250 copies / mL. A negative result does not preclude SARS-CoV-2 infection and should not be used as the sole basis for treatment or other patient management decisions.  A negative result may occur with improper specimen collection / handling, submission of specimen other than nasopharyngeal swab, presence of viral mutation(s) within the areas targeted by this assay, and inadequate number of viral copies (<250 copies / mL). A negative result must be combined with clinical observations, patient history, and epidemiological information.  Fact Sheet for  Patients:   StrictlyIdeas.no  Fact Sheet for Healthcare Providers: BankingDealers.co.za  This test is not yet approved or  cleared by the [place] FDA and has been authorized for detection and/or diagnosis of SARS-CoV-2 by FDA under an Emergency Use Authorization (EUA).   This EUA will remain in effect (meaning this test can be used) for the duration of the COVID-19 declaration under Section 564(b)(1) of the Act, 21 U.S.C. section 360bbb-3(b)(1), unless the authorization is terminated or revoked sooner.  Performed at [facility], [place] [address] [place], [place] [address]   MRSA PCR Screening     Status: None   Collection Time: 12/03/19  9:31 AM   Specimen: Nasopharyngeal  Result Value Ref Range Status   MRSA by PCR NEGATIVE NEGATIVE Final    Comment:        The GeneXpert MRSA Assay (FDA approved for NASAL specimens only), is one component of a comprehensive MRSA colonization surveillance program. It is not intended to diagnose MRSA infection nor to guide or monitor treatment for MRSA infections. Performed at [facility], [place] [address] [place], [place] [address]      Time coordinating discharge: 25  minutes  SIGNED: Antonieta Pert, MD  [facility] 12/08/2019, 4:41 PM  If 7PM-7AM, please contact night-coverage [email or web address]

## 2019-12-05 NOTE — Progress Notes (Signed)
PROGRESS NOTE    Judy Cook  ZOX:[id number] DOB: Nov 22, 2000 DOA: 12/02/2019 PCP: Wylene Men C, FNP   Chief Complaint  Patient presents with   Hyperglycemia    Brief Narrative: 20 year old with history of DM1 presents with hyperglycemia, nausea and vomiting. Has not been taking her insulin at home, found to be in diabetic ketoacidosis.Initially patient required aggressive IV fluids, bicarb drip. Over the course of 36 hours this was weaned off and transition to subcutaneous insulin. She was initially quite tachycardic as well requiring IV fluids due to significant amount of dehydration. Patient was treated in the medical floor, she was placed back on long-acting and short-acting insulin, with improvement in the blood sugar.  Patient had episode of vomiting last night   Subjective: Seen this morning complains of nausea give Reglan.   Assessment & Plan:  DKA: In the setting of not using insulin patient landed into DKA.  DKA has resolved.  Blood sugar poorly controlled, hemoglobin A1c 12.  Continue Lantus 20 units daily 6 units premeal, slowly increase to home dose level.  But given patient's nausea and difficulty with diet tolerance will monitor next 24 hours.  SIRS with tachycardia and leukocytosis suspect this is in the setting of dehydration and DKA no obvious infection noted.  Continue gentle hydration also is having nausea.  Hypokalemi:replete orally.   DVT prophylaxis: enoxaparin (LOVENOX) injection 40 mg Start: 12/03/19 1000 Code Status:   Code Status: Full Code  Family Communication: plan of care discussed with patient at bedside.  Status is: Inpatient  Remains inpatient appropriate because:Inpatient level of care appropriate due to severity of illness and will monitor her next 24 hrs due to ongoing nausea.   Dispo: The patient is from: Home              Anticipated d/c is to: Home              Anticipated d/c date is: 1 day              Patient currently is  not medically stable to d/c. Nutrition: Diet Order            Diet Carb Modified Fluid consistency: Thin; Room service appropriate? Yes  Diet effective now                    Body mass index is 24.63 kg/m.  Consultants:see note  Procedures:see note Microbiology:see note Blood Culture No results found for: SDES, SPECREQUEST, CULT, REPTSTATUS  Other culture-see note  Medications: Scheduled Meds:  Chlorhexidine Gluconate Cloth  6 each Topical Daily   enoxaparin (LOVENOX) injection  40 mg Subcutaneous Q24H   insulin aspart  0-15 Units Subcutaneous TID WC   insulin aspart  0-5 Units Subcutaneous QHS   insulin aspart  6 Units Subcutaneous TID WC   insulin glargine  20 Units Subcutaneous Daily   sodium chloride flush  10-40 mL Intracatheter Q12H   Continuous Infusions:  sodium chloride 75 mL/hr at 12/05/19 0735   famotidine (PEPCID) IV 20 mg (12/05/19 0956)   insulin 3.4 mL/hr at 12/04/19 0800    Antimicrobials: Anti-infectives (From admission, onward)   None       Objective: Vitals: Today's Vitals   12/05/19 0735 12/05/19 0805 12/05/19 0925 12/05/19 1306  BP:   128/80 (!) 143/94  Pulse:   96 94  Resp:   18   Temp:   98.2 F (36.8 C) 98.7 F (37.1 C)  TempSrc:    Oral  SpO2:   100% 100%  Weight:      Height:      PainSc: 7  0-No pain      Intake/Output Summary (Last 24 hours) at 12/05/2019 1414 Last data filed at 12/05/2019 1329 Gross per 24 hour  Intake 1300.19 ml  Output 0 ml  Net 1300.19 ml   Filed Weights   12/02/19 1543 12/03/19 0930  Weight: 55 kg 57.2 kg   Weight change:    Intake/Output from previous day: 08/12 0701 - 08/13 0700 In: 1707.7 [P.O.:120; I.V.:1094.6; IV Piggyback:493.1] Out: 0  Intake/Output this shift: Total I/O In: 240 [P.O.:240] Out: 0   Examination:  General exam: AAOx3 ,NAD, weak appearing. HEENT:Oral mucosa moist, Ear/Nose WNL grossly,dentition normal. Respiratory system: bilaterally clear,no wheezing  or crackles,no use of accessory muscle, non tender. Cardiovascular system: S1 & S2 +, regular, No JVD. Gastrointestinal system: Abdomen soft, NT,ND, BS+. Nervous System:Alert, awake, moving extremities and grossly nonfocal Extremities: No edema, distal peripheral pulses palpable.  Skin: No rashes,no icterus. MSK: Normal muscle bulk,tone, power  Data Reviewed: I have personally reviewed following labs and imaging studies CBC: Recent Labs  Lab 12/02/19 2326 12/02/19 2357 12/03/19 0301 12/04/19 0539 12/05/19 0345  WBC 33.1*  --   --  20.8* 13.3*  NEUTROABS  --  27.7*  --   --   --   HGB 14.5  --  16.7* 13.1 11.8*  HCT 46.0  --  49.0* 37.0 33.7*  MCV 99.6  --   --  89.8 89.9  PLT 435*  --   --  282 236   Basic Metabolic Panel: Recent Labs  Lab 12/03/19 1744 12/03/19 2112 12/04/19 0159 12/04/19 0539 12/05/19 0345  NA 135 136 136 139 136  K 3.4* 3.7 3.5 3.3* 3.3*  CL 113* 114* 114* 113* 107  CO2 10* 13* 14* 18* 19*  GLUCOSE 223* 131* 137* 124* 245*  BUN 19 15 14 12 8   CREATININE 0.87 0.77 0.86 0.83 0.75  CALCIUM 7.7* 7.9* 8.2* 8.6* 8.4*  MG  --   --   --  1.9 2.1   GFR: Estimated Creatinine Clearance: 88.9 mL/min (by C-G formula based on SCr of 0.75 mg/dL). Liver Function Tests: No results for input(s): AST, ALT, ALKPHOS, BILITOT, PROT, ALBUMIN in the last 168 hours. No results for input(s): LIPASE, AMYLASE in the last 168 hours. No results for input(s): AMMONIA in the last 168 hours. Coagulation Profile: No results for input(s): INR, PROTIME in the last 168 hours. Cardiac Enzymes: No results for input(s): CKTOTAL, CKMB, CKMBINDEX, TROPONINI in the last 168 hours. BNP (last 3 results) No results for input(s): PROBNP in the last 8760 hours. HbA1C: Recent Labs    12/03/19 0605  HGBA1C 12.0*   CBG: Recent Labs  Lab 12/04/19 1221 12/04/19 1629 12/04/19 2113 12/05/19 0732 12/05/19 1118  GLUCAP 226* 123* 148* 368* 237*   Lipid Profile: No results for  input(s): CHOL, HDL, LDLCALC, TRIG, CHOLHDL, LDLDIRECT in the last 72 hours. Thyroid Function Tests: No results for input(s): TSH, T4TOTAL, FREET4, T3FREE, THYROIDAB in the last 72 hours. Anemia Panel: No results for input(s): VITAMINB12, FOLATE, FERRITIN, TIBC, IRON, RETICCTPCT in the last 72 hours. Sepsis Labs: No results for input(s): PROCALCITON, LATICACIDVEN in the last 168 hours.  Recent Results (from the past 240 hour(s))  SARS Coronavirus 2 by RT PCR (hospital order, performed in [facility] hospital lab) Nasopharyngeal Nasopharyngeal Swab     Status: None   Collection Time: 12/03/19  5:07  AM   Specimen: Nasopharyngeal Swab  Result Value Ref Range Status   SARS Coronavirus 2 NEGATIVE NEGATIVE Final    Comment: (NOTE) SARS-CoV-2 target nucleic acids are NOT DETECTED.  The SARS-CoV-2 RNA is generally detectable in upper and lower respiratory specimens during the acute phase of infection. The lowest concentration of SARS-CoV-2 viral copies this assay can detect is 250 copies / mL. A negative result does not preclude SARS-CoV-2 infection and should not be used as the sole basis for treatment or other patient management decisions.  A negative result may occur with improper specimen collection / handling, submission of specimen other than nasopharyngeal swab, presence of viral mutation(s) within the areas targeted by this assay, and inadequate number of viral copies (<250 copies / mL). A negative result must be combined with clinical observations, patient history, and epidemiological information.  Fact Sheet for Patients:   BoilerBrush.com.cy  Fact Sheet for Healthcare Providers: https://pope.com/  This test is not yet approved or  cleared by the [place] FDA and has been authorized for detection and/or diagnosis of SARS-CoV-2 by FDA under an Emergency Use Authorization (EUA).  This EUA will remain in effect (meaning this  test can be used) for the duration of the COVID-19 declaration under Section 564(b)(1) of the Act, 21 U.S.C. section 360bbb-3(b)(1), unless the authorization is terminated or revoked sooner.  Performed at [facility], [address] [place], [place] [address]   MRSA PCR Screening     Status: None   Collection Time: 12/03/19  9:31 AM   Specimen: Nasopharyngeal  Result Value Ref Range Status   MRSA by PCR NEGATIVE NEGATIVE Final    Comment:        The GeneXpert MRSA Assay (FDA approved for NASAL specimens only), is one component of a comprehensive MRSA colonization surveillance program. It is not intended to diagnose MRSA infection nor to guide or monitor treatment for MRSA infections. Performed at [facility], [address] [place], [place] [address]       Radiology Studies: No results found.   LOS: 2 days   Lanae Boast, MD [facility]  12/05/2019, 2:14 PM

## 2019-12-06 ENCOUNTER — Inpatient Hospital Stay (HOSPITAL_COMMUNITY): Payer: Federal, State, Local not specified - PPO

## 2019-12-06 DIAGNOSIS — R1012 Left upper quadrant pain: Secondary | ICD-10-CM

## 2019-12-06 DIAGNOSIS — R112 Nausea with vomiting, unspecified: Secondary | ICD-10-CM

## 2019-12-06 LAB — GLUCOSE, CAPILLARY
Glucose-Capillary: 261 mg/dL — ABNORMAL HIGH (ref 70–99)
Glucose-Capillary: 163 mg/dL — ABNORMAL HIGH (ref 70–99)
Glucose-Capillary: 170 mg/dL — ABNORMAL HIGH (ref 70–99)
Glucose-Capillary: 159 mg/dL — ABNORMAL HIGH (ref 70–99)

## 2019-12-06 LAB — BASIC METABOLIC PANEL
Anion gap: 11 (ref 5–15)
BUN: 10 mg/dL (ref 6–20)
CO2: 20 mmol/L — ABNORMAL LOW (ref 22–32)
Calcium: 8 mg/dL — ABNORMAL LOW (ref 8.9–10.3)
Chloride: 105 mmol/L (ref 98–111)
Creatinine, Ser: 0.64 mg/dL (ref 0.44–1.00)
GFR calc Af Amer: >60 mL/min (ref >60)
GFR calc non Af Amer: >60 mL/min (ref >60)
Glucose, Bld: 177 mg/dL — ABNORMAL HIGH (ref 70–99)
Potassium: 3.2 mmol/L — ABNORMAL LOW (ref 3.5–5.1)
Sodium: 136 mmol/L (ref 135–145)

## 2019-12-06 MED ORDER — SODIUM CHLORIDE (PF) 0.9 % IJ SOLN
INTRAMUSCULAR | Status: AC
  Filled 2019-12-06: qty 50

## 2019-12-06 MED ORDER — CALCIUM CARBONATE ANTACID 500 MG PO CHEW: 1.0000 | CHEWABLE_TABLET | Freq: Two times a day (BID) | ORAL | Status: DC | PRN

## 2019-12-06 MED ORDER — PANTOPRAZOLE SODIUM 40 MG IV SOLR
40.0000 mg | INTRAVENOUS | Status: DC
  Administered 2019-12-06 – 2019-12-07 (×2): 40 mg via INTRAVENOUS
  Filled 2019-12-06 (×2): qty 40

## 2019-12-06 MED ORDER — METOCLOPRAMIDE HCL 5 MG/ML IJ SOLN
10.0000 mg | Freq: Three times a day (TID) | INTRAMUSCULAR | Status: AC
  Administered 2019-12-06 – 2019-12-08 (×6): 10 mg via INTRAVENOUS
  Filled 2019-12-06 (×6): qty 2

## 2019-12-06 MED ORDER — POTASSIUM CHLORIDE CRYS ER 20 MEQ PO TBCR
40.0000 meq | EXTENDED_RELEASE_TABLET | Freq: Once | ORAL | Status: AC
  Administered 2019-12-06: 10:00:00 10 meq via ORAL
  Filled 2019-12-06: qty 2

## 2019-12-06 MED ORDER — IOHEXOL 300 MG/ML  SOLN
100.0000 mL | Freq: Once | INTRAMUSCULAR | Status: AC | PRN
  Administered 2019-12-06: 12:00:00 100 mL via INTRAVENOUS

## 2019-12-06 MED ORDER — ALUM & MAG HYDROXIDE-SIMETH 200-200-20 MG/5ML PO SUSP
30.0000 mL | Freq: Four times a day (QID) | ORAL | Status: DC | PRN
  Administered 2019-12-06: 30 mL via ORAL
  Filled 2019-12-06: qty 30

## 2019-12-06 MED ORDER — POTASSIUM CHLORIDE 10 MEQ/100ML IV SOLN
10.0000 meq | INTRAVENOUS | Status: AC
  Administered 2019-12-06 (×3): 10 meq via INTRAVENOUS
  Filled 2019-12-06 (×3): qty 100

## 2019-12-06 NOTE — Progress Notes (Signed)
PROGRESS NOTE    Judy Cook  HKV:[id number] DOB: [birth year] DOA: 12/02/2019 PCP: Wylene Men C, FNP   Chief Complaint  Patient presents with  . Hyperglycemia    Brief Narrative: 19 year old with history of DM1 presents with hyperglycemia, nausea and vomiting. Has not been taking her insulin at home, found to be in diabetic ketoacidosis.Initially patient required aggressive IV fluids, bicarb drip. Over the course of 36 hours this was weaned off and transition to subcutaneous insulin. She was initially quite tachycardic as well requiring IV fluids due to significant amount of dehydration. Patient was treated in the medical floor, she was placed back on long-acting and short-acting insulin, with improvement in the blood sugar.   Remains hospitalized due to ongoing nausea vomiting  Subjective: C/O NAUSEA, VOMITING and LUQ abd pain, pepcid, maalox not helping, just got phenergan. No diarrhea or chest pain no RUQ abd pain  Assessment & Plan:  DKA: In the setting of not using insulin patient landed into DKA, also having nausea vomiting. DKA has resolved.  Poorly controlled hemoglobin A1c of care.  Under Lantus 20 units daily, 3 meals 6 units if able to eat and sliding scale.  Due to intractable nausea abdominal pain getting ct abd- and continue supportive measures with Protonix nausea medication gentle hydration for now.   Recent Labs  Lab 12/05/19 0732 12/05/19 1118 12/05/19 1625 12/05/19 2123 12/06/19 0723  GLUCAP 368* 237* 117* 88 261*   SIRS with tachycardia and leukocytosis suspect this is in the setting of dehydration and DKA no obvious infection noted.  Continue gentle hydration.  LUQ Abd pain with nausea and vomiting0 get ct abd. Has had similar abd pain before, now with [place] intractable, add ppi, reglan q8h, f/u ct abd  Hypokalemi:repleting po and iv.  DVT prophylaxis: enoxaparin (LOVENOX) injection 40 mg Start: 12/03/19 1000 Code Status:   Code Status: Full  Code  Family Communication: plan of care discussed with patient at bedside.  Status is: Inpatient  Remains inpatient appropriate because:Inpatient level of care appropriate due to severity of illness and will monitor her next 24 hrs due to ongoing nausea.   Dispo: The patient is from: Home              Anticipated d/c is to: Home              Anticipated d/c date is: 1 day              Patient currently is not medically stable to d/c.  Plan for home once abdominal pain nausea vomiting results and tolerating diet her blood sugar is stable Nutrition: Diet Order            Diet Carb Modified Fluid consistency: Thin; Room service appropriate? Yes  Diet effective now                    Body mass index is 24.63 kg/m.  Consultants:see note  Procedures:see note Microbiology:see note Blood Culture No results found for: SDES, SPECREQUEST, CULT, REPTSTATUS  Other culture-see note  Medications: Scheduled Meds: . Chlorhexidine Gluconate Cloth  6 each Topical Daily  . enoxaparin (LOVENOX) injection  40 mg Subcutaneous Q24H  . insulin aspart  0-15 Units Subcutaneous TID WC  . insulin aspart  0-5 Units Subcutaneous QHS  . insulin aspart  6 Units Subcutaneous TID WC  . insulin glargine  20 Units Subcutaneous Daily  . metoCLOPramide (REGLAN) injection  10 mg Intravenous Q8H  . pantoprazole (PROTONIX) IV  40 mg Intravenous Q24H  . sodium chloride flush  10-40 mL Intracatheter Q12H   Continuous Infusions: . sodium chloride 75 mL/hr at 12/05/19 1500  . insulin 3.4 mL/hr at 12/04/19 0800  . potassium chloride      Antimicrobials: Anti-infectives (From admission, onward)   None       Objective: Vitals: Today's Vitals   12/05/19 2245 12/05/19 2330 12/06/19 0519 12/06/19 0755  BP:   137/86   Pulse:   90   Resp:   17   Temp:   97.9 F (36.6 C)   TempSrc:      SpO2:   100%   Weight:      Height:      PainSc: 5  0-No pain  0-No pain    Intake/Output Summary (Last 24  hours) at 12/06/2019 1035 Last data filed at 12/06/2019 1000 Gross per 24 hour  Intake 2095.52 ml  Output --  Net 2095.52 ml   Filed Weights   12/02/19 1543 12/03/19 0930  Weight: 55 kg 57.2 kg   Weight change:    Intake/Output from previous day: 08/13 0701 - 08/14 0700 In: 2211 [P.O.:840; I.V.:1221; IV Piggyback:150] Out: 0  Intake/Output this shift: Total I/O In: 120 [P.O.:120] Out: -   Examination:  General exam: AAOx3, sleepy after phenergan, NAD, weak appearing. HEENT:Oral mucosa moist, Ear/Nose WNL grossly, dentition normal. Respiratory system: bilaterally clear,no wheezing or crackles,no use of accessory muscle Cardiovascular system: S1 & S2 +, No JVD,. Gastrointestinal system: Abdomen soft, mildly tender left upper quadrant, nontender right upper quadrant, BS+ Nervous System:Alert, awake, moving extremities and grossly nonfocal Extremities: No edema, distal peripheral pulses palpable.  Skin: No rashes,no icterus. MSK: Normal muscle bulk,tone, power   Data Reviewed: I have personally reviewed following labs and imaging studies CBC: Recent Labs  Lab 12/02/19 2326 12/02/19 2357 12/03/19 0301 12/04/19 0539 12/05/19 0345  WBC 33.1*  --   --  20.8* 13.3*  NEUTROABS  --  27.7*  --   --   --   HGB 14.5  --  16.7* 13.1 11.8*  HCT 46.0  --  49.0* 37.0 33.7*  MCV 99.6  --   --  89.8 89.9  PLT 435*  --   --  282 236   Basic Metabolic Panel: Recent Labs  Lab 12/03/19 2112 12/04/19 0159 12/04/19 0539 12/05/19 0345 12/06/19 0350  NA 136 136 139 136 136  K 3.7 3.5 3.3* 3.3* 3.2*  CL 114* 114* 113* 107 105  CO2 13* 14* 18* 19* 20*  GLUCOSE 131* 137* 124* 245* 177*  BUN 15 14 12 8 10   CREATININE 0.77 0.86 0.83 0.75 0.64  CALCIUM 7.9* 8.2* 8.6* 8.4* 8.0*  MG  --   --  1.9 2.1  --    GFR: Estimated Creatinine Clearance: 88.9 mL/min (by C-G formula based on SCr of 0.64 mg/dL). Liver Function Tests: No results for input(s): AST, ALT, ALKPHOS, BILITOT, PROT,  ALBUMIN in the last 168 hours. No results for input(s): LIPASE, AMYLASE in the last 168 hours. No results for input(s): AMMONIA in the last 168 hours. Coagulation Profile: No results for input(s): INR, PROTIME in the last 168 hours. Cardiac Enzymes: No results for input(s): CKTOTAL, CKMB, CKMBINDEX, TROPONINI in the last 168 hours. BNP (last 3 results) No results for input(s): PROBNP in the last 8760 hours. HbA1C: No results for input(s): HGBA1C in the last 72 hours. CBG: Recent Labs  Lab 12/05/19 0732 12/05/19 1118 12/05/19 1625 12/05/19 2123 12/06/19  0723  GLUCAP 368* 237* 117* 88 261*   Lipid Profile: No results for input(s): CHOL, HDL, LDLCALC, TRIG, CHOLHDL, LDLDIRECT in the last 72 hours. Thyroid Function Tests: No results for input(s): TSH, T4TOTAL, FREET4, T3FREE, THYROIDAB in the last 72 hours. Anemia Panel: No results for input(s): VITAMINB12, FOLATE, FERRITIN, TIBC, IRON, RETICCTPCT in the last 72 hours. Sepsis Labs: No results for input(s): PROCALCITON, LATICACIDVEN in the last 168 hours.  Recent Results (from the past 240 hour(s))  SARS Coronavirus 2 by RT PCR (hospital order, performed in [facility] hospital lab) Nasopharyngeal Nasopharyngeal Swab     Status: None   Collection Time: 12/03/19  5:07 AM   Specimen: Nasopharyngeal Swab  Result Value Ref Range Status   SARS Coronavirus 2 NEGATIVE NEGATIVE Final    Comment: (NOTE) SARS-CoV-2 target nucleic acids are NOT DETECTED.  The SARS-CoV-2 RNA is generally detectable in upper and lower respiratory specimens during the acute phase of infection. The lowest concentration of SARS-CoV-2 viral copies this assay can detect is 250 copies / mL. A negative result does not preclude SARS-CoV-2 infection and should not be used as the sole basis for treatment or other patient management decisions.  A negative result may occur with improper specimen collection / handling, submission of specimen other than nasopharyngeal  swab, presence of viral mutation(s) within the areas targeted by this assay, and inadequate number of viral copies (<250 copies / mL). A negative result must be combined with clinical observations, patient history, and epidemiological information.  Fact Sheet for Patients:   BoilerBrush.com.cy  Fact Sheet for Healthcare Providers: https://pope.com/  This test is not yet approved or  cleared by the [place] FDA and has been authorized for detection and/or diagnosis of SARS-CoV-2 by FDA under an Emergency Use Authorization (EUA).  This EUA will remain in effect (meaning this test can be used) for the duration of the COVID-19 declaration under Section 564(b)(1) of the Act, 21 U.S.C. section 360bbb-3(b)(1), unless the authorization is terminated or revoked sooner.  Performed at [facility], [address], [place], [place] [address]   MRSA PCR Screening     Status: None   Collection Time: 12/03/19  9:31 AM   Specimen: Nasopharyngeal  Result Value Ref Range Status   MRSA by PCR NEGATIVE NEGATIVE Final    Comment:        The GeneXpert MRSA Assay (FDA approved for NASAL specimens only), is one component of a comprehensive MRSA colonization surveillance program. It is not intended to diagnose MRSA infection nor to guide or monitor treatment for MRSA infections. Performed at [facility], [address], [place], [place] [address]       Radiology Studies: No results found.   LOS: 3 days   Lanae Boast, MD [facility]  12/06/2019, 10:35 AM

## 2019-12-07 LAB — BASIC METABOLIC PANEL
Anion gap: 10 (ref 5–15)
BUN: 9 mg/dL (ref 6–20)
CO2: 16 mmol/L — ABNORMAL LOW (ref 22–32)
Calcium: 8.2 mg/dL — ABNORMAL LOW (ref 8.9–10.3)
Chloride: 109 mmol/L (ref 98–111)
Creatinine, Ser: 0.69 mg/dL (ref 0.44–1.00)
GFR calc Af Amer: >60 mL/min (ref >60)
GFR calc non Af Amer: >60 mL/min (ref >60)
Glucose, Bld: 184 mg/dL — ABNORMAL HIGH (ref 70–99)
Potassium: 3.1 mmol/L — ABNORMAL LOW (ref 3.5–5.1)
Sodium: 135 mmol/L (ref 135–145)

## 2019-12-07 LAB — COMPREHENSIVE METABOLIC PANEL
ALT: 22 U/L (ref 0–44)
AST: 16 U/L (ref 15–41)
Albumin: 3.5 g/dL (ref 3.5–5.0)
Alkaline Phosphatase: 63 U/L (ref 38–126)
Anion gap: 16 — ABNORMAL HIGH (ref 5–15)
BUN: 8 mg/dL (ref 6–20)
CO2: 16 mmol/L — ABNORMAL LOW (ref 22–32)
Calcium: 8.3 mg/dL — ABNORMAL LOW (ref 8.9–10.3)
Chloride: 103 mmol/L (ref 98–111)
Creatinine, Ser: 0.74 mg/dL (ref 0.44–1.00)
GFR calc Af Amer: >60 mL/min (ref >60)
GFR calc non Af Amer: >60 mL/min (ref >60)
Glucose, Bld: 348 mg/dL — ABNORMAL HIGH (ref 70–99)
Potassium: 3.7 mmol/L (ref 3.5–5.1)
Sodium: 135 mmol/L (ref 135–145)
Total Bilirubin: 1.3 mg/dL — ABNORMAL HIGH (ref 0.3–1.2)
Total Protein: 6.3 g/dL — ABNORMAL LOW (ref 6.5–8.1)

## 2019-12-07 LAB — GLUCOSE, CAPILLARY
Glucose-Capillary: 329 mg/dL — ABNORMAL HIGH (ref 70–99)
Glucose-Capillary: 200 mg/dL — ABNORMAL HIGH (ref 70–99)
Glucose-Capillary: 114 mg/dL — ABNORMAL HIGH (ref 70–99)
Glucose-Capillary: 153 mg/dL — ABNORMAL HIGH (ref 70–99)

## 2019-12-07 LAB — CBC
HCT: 33.7 % — ABNORMAL LOW (ref 36.0–46.0)
Hemoglobin: 11.5 g/dL — ABNORMAL LOW (ref 12.0–15.0)
MCH: 31.5 pg (ref 26.0–34.0)
MCHC: 34.1 g/dL (ref 30.0–36.0)
MCV: 92.3 fL (ref 80.0–100.0)
Platelets: 250 10*3/uL (ref 150–400)
RBC: 3.65 MIL/uL — ABNORMAL LOW (ref 3.87–5.11)
RDW: 12.9 % (ref 11.5–15.5)
WBC: 5.8 10*3/uL (ref 4.0–10.5)
nRBC: 0 % (ref 0.0–0.2)

## 2019-12-07 MED ORDER — METOCLOPRAMIDE HCL 10 MG PO TABS
10.0000 mg | ORAL_TABLET | Freq: Three times a day (TID) | ORAL | 0 refills | Status: DC
Start: 2019-12-07 — End: 2020-09-30

## 2019-12-07 MED ORDER — POTASSIUM CHLORIDE ER 20 MEQ PO TBCR: 20.0000 meq | EXTENDED_RELEASE_TABLET | Freq: Every day | ORAL | 0 refills | Status: DC

## 2019-12-07 MED ORDER — INSULIN ASPART 100 UNIT/ML ~~LOC~~ SOLN
8.0000 [IU] | Freq: Three times a day (TID) | SUBCUTANEOUS | Status: DC
  Administered 2019-12-08 (×2): 8 [IU] via SUBCUTANEOUS

## 2019-12-07 MED ORDER — INSULIN ASPART 100 UNIT/ML ~~LOC~~ SOLN
5.0000 [IU] | Freq: Once | SUBCUTANEOUS | Status: AC
  Administered 2019-12-07: 5 [IU] via SUBCUTANEOUS

## 2019-12-07 MED ORDER — ONDANSETRON HCL 4 MG PO TABS: 4.0000 mg | ORAL_TABLET | Freq: Three times a day (TID) | ORAL | 0 refills | Status: AC | PRN

## 2019-12-07 MED ORDER — INSULIN ASPART 100 UNIT/ML ~~LOC~~ SOLN: 8.0000 [IU] | Freq: Three times a day (TID) | SUBCUTANEOUS | Status: DC

## 2019-12-07 MED ORDER — PANTOPRAZOLE SODIUM 40 MG PO TBEC
40.0000 mg | DELAYED_RELEASE_TABLET | Freq: Every day | ORAL | 0 refills | Status: DC
Start: 2019-12-07 — End: 2022-09-08

## 2019-12-07 MED ORDER — POTASSIUM CHLORIDE CRYS ER 20 MEQ PO TBCR
40.0000 meq | EXTENDED_RELEASE_TABLET | Freq: Once | ORAL | Status: DC
  Filled 2019-12-07: qty 2

## 2019-12-07 MED ORDER — INSULIN GLARGINE 100 UNIT/ML ~~LOC~~ SOLN
24.0000 [IU] | Freq: Every day | SUBCUTANEOUS | Status: DC
  Administered 2019-12-07 – 2019-12-08 (×2): 24 [IU] via SUBCUTANEOUS
  Filled 2019-12-07 (×2): qty 0.24

## 2019-12-07 NOTE — Progress Notes (Signed)
Dr. Dayna Barker notified pt not feeling well and wanted to stay. Cancelled dc home per MD order.

## 2019-12-08 LAB — COMPREHENSIVE METABOLIC PANEL
ALT: 22 U/L (ref 0–44)
AST: 19 U/L (ref 15–41)
Albumin: 3.4 g/dL — ABNORMAL LOW (ref 3.5–5.0)
Alkaline Phosphatase: 61 U/L (ref 38–126)
Anion gap: 12 (ref 5–15)
BUN: 6 mg/dL (ref 6–20)
CO2: 14 mmol/L — ABNORMAL LOW (ref 22–32)
Calcium: 8.1 mg/dL — ABNORMAL LOW (ref 8.9–10.3)
Chloride: 108 mmol/L (ref 98–111)
Creatinine, Ser: 0.68 mg/dL (ref 0.44–1.00)
GFR calc Af Amer: >60 mL/min (ref >60)
GFR calc non Af Amer: >60 mL/min (ref >60)
Glucose, Bld: 199 mg/dL — ABNORMAL HIGH (ref 70–99)
Potassium: 3.1 mmol/L — ABNORMAL LOW (ref 3.5–5.1)
Sodium: 134 mmol/L — ABNORMAL LOW (ref 135–145)
Total Bilirubin: 0.9 mg/dL (ref 0.3–1.2)
Total Protein: 6.2 g/dL — ABNORMAL LOW (ref 6.5–8.1)

## 2019-12-08 LAB — CBC
HCT: 32.3 % — ABNORMAL LOW (ref 36.0–46.0)
Hemoglobin: 11.3 g/dL — ABNORMAL LOW (ref 12.0–15.0)
MCH: 31.7 pg (ref 26.0–34.0)
MCHC: 35 g/dL (ref 30.0–36.0)
MCV: 90.5 fL (ref 80.0–100.0)
Platelets: 330 10*3/uL (ref 150–400)
RBC: 3.57 MIL/uL — ABNORMAL LOW (ref 3.87–5.11)
RDW: 12.7 % (ref 11.5–15.5)
WBC: 7.7 10*3/uL (ref 4.0–10.5)
nRBC: 0 % (ref 0.0–0.2)

## 2019-12-08 LAB — BASIC METABOLIC PANEL
Anion gap: 10 (ref 5–15)
BUN: <5 mg/dL — ABNORMAL LOW (ref 6–20)
CO2: 17 mmol/L — ABNORMAL LOW (ref 22–32)
Calcium: 8.2 mg/dL — ABNORMAL LOW (ref 8.9–10.3)
Chloride: 109 mmol/L (ref 98–111)
Creatinine, Ser: 0.6 mg/dL (ref 0.44–1.00)
GFR calc Af Amer: >60 mL/min (ref >60)
GFR calc non Af Amer: >60 mL/min (ref >60)
Glucose, Bld: 106 mg/dL — ABNORMAL HIGH (ref 70–99)
Potassium: 3.4 mmol/L — ABNORMAL LOW (ref 3.5–5.1)
Sodium: 136 mmol/L (ref 135–145)

## 2019-12-08 LAB — BETA-HYDROXYBUTYRIC ACID
Beta-Hydroxybutyric Acid: 4.36 mmol/L — ABNORMAL HIGH (ref 0.05–0.27)
Beta-Hydroxybutyric Acid: 2.07 mmol/L — ABNORMAL HIGH (ref 0.05–0.27)

## 2019-12-08 LAB — GLUCOSE, CAPILLARY
Glucose-Capillary: 179 mg/dL — ABNORMAL HIGH (ref 70–99)
Glucose-Capillary: 93 mg/dL (ref 70–99)
Glucose-Capillary: 84 mg/dL (ref 70–99)

## 2019-12-08 MED ORDER — POTASSIUM CHLORIDE CRYS ER 20 MEQ PO TBCR
40.0000 meq | EXTENDED_RELEASE_TABLET | ORAL | Status: AC
  Administered 2019-12-08 (×2): 40 meq via ORAL
  Filled 2019-12-08 (×2): qty 2

## 2019-12-08 MED ORDER — METOCLOPRAMIDE HCL 10 MG PO TABS
10.0000 mg | ORAL_TABLET | Freq: Three times a day (TID) | ORAL | Status: DC
  Administered 2019-12-08 (×2): 10 mg via ORAL
  Filled 2019-12-08 (×2): qty 1

## 2019-12-08 MED ORDER — SODIUM CHLORIDE 0.9 % IV BOLUS
1000.0000 mL | Freq: Once | INTRAVENOUS | Status: AC
  Administered 2019-12-08: 1000 mL via INTRAVENOUS

## 2019-12-08 MED ORDER — SODIUM CHLORIDE 0.9 % IV BOLUS: 1000.0000 mL | Freq: Once | INTRAVENOUS | Status: DC

## 2019-12-08 MED ORDER — PANTOPRAZOLE SODIUM 40 MG PO TBEC
40.0000 mg | DELAYED_RELEASE_TABLET | Freq: Every day | ORAL | Status: DC
  Administered 2019-12-08: 40 mg via ORAL
  Filled 2019-12-08: qty 1

## 2019-12-08 NOTE — Progress Notes (Signed)
PROGRESS NOTE    Judy Cook  OMV:[id number] DOB: 11/01/2000 DOA: 12/02/2019 PCP: Wylene Men C, FNP   Chief Complaint  Patient presents with  . Hyperglycemia    Brief Narrative: 19 year old with history of DM1 presents with hyperglycemia, nausea and vomiting. Has not been taking her insulin at home, found to be in diabetic ketoacidosis.Initially patient required aggressive IV fluids, bicarb drip. Over the course of 36 hours this was weaned off and transition to subcutaneous insulin. She was initially quite tachycardic as well requiring IV fluids due to significant amount of dehydration. Patient was treated in the medical floor, she was placed back on long-acting and short-acting insulin, with improvement in the blood sugar.   Remains hospitalized due to ongoing nausea vomiting  Subjective: TOLERATING DIET WELL.  Assessment & Plan:  DKA: resolved.  Discharge was held yesterday due to difficulty with diet tolerance.  Today 8 1 blood sugar stable, had slightly elevated beta hydrate better EKG done low bicarb at 14 but normal gap, gave a bolus 1 L normal saline repeat labs are much better.  Patient is adamant on going home today.  She will go home and resume her home insulin regimen hydrate herself well and follow-up with PCP soon.    Recent Labs  Lab 12/07/19 1650 12/07/19 2137 12/08/19 0733 12/08/19 1117 12/08/19 1623  GLUCAP 114* 153* 179* 93 84   SIRS with tachycardia and leukocytosis suspect this is in the setting of dehydration and DKA no obvious infection noted.  Tolerating diet.  Stop IV fluids.  LUQ Abd pain with nausea and vomiting: Resolved.  CT abdomen shows nonspecific possible bowel wall inflammation, follow-up with PCP.  No diarrhea and having regular bowel movement.    Hypokalemi repleted added oral potassium for discharge.  DVT prophylaxis: enoxaparin (LOVENOX) injection 40 mg Start: 12/03/19 1000 Code Status:   Code Status: Full Code  Family  Communication: plan of care discussed with patient at bedside.  Status is: Inpatient  Remains inpatient appropriate because:Inpatient level of care appropriate due to severity of illness and will monitor her next 24 hrs due to ongoing nausea.   Dispo: The patient is from: Home              Anticipated d/c is to: Home              Anticipated d/c date is: today              Patient currently stable for d/c. Nutrition: Diet Order            Diet Carb Modified           Diet Carb Modified Fluid consistency: Thin; Room service appropriate? Yes  Diet effective now                    Body mass index is 24.63 kg/m.  Consultants:see note  Procedures:see note Microbiology:see note Blood Culture No results found for: SDES, SPECREQUEST, CULT, REPTSTATUS  Other culture-see note  Medications: Scheduled Meds: . Chlorhexidine Gluconate Cloth  6 each Topical Daily  . enoxaparin (LOVENOX) injection  40 mg Subcutaneous Q24H  . insulin aspart  0-15 Units Subcutaneous TID WC  . insulin aspart  0-5 Units Subcutaneous QHS  . insulin aspart  8 Units Subcutaneous TID WC  . insulin glargine  24 Units Subcutaneous Daily  . metoCLOPramide  10 mg Oral TID AC  . pantoprazole  40 mg Oral Daily  . sodium chloride flush  10-40 mL Intracatheter  Q12H   Continuous Infusions: . sodium chloride 125 mL/hr at 12/08/19 1157    Antimicrobials: Anti-infectives (From admission, onward)   None       Objective: Vitals: Today's Vitals   12/08/19 0525 12/08/19 0800 12/08/19 0933 12/08/19 1253  BP: 126/85  (!) 139/94 (!) 141/99  Pulse: 86  92 79  Resp: 16   18  Temp: 98.1 F (36.7 C)  99.3 F (37.4 C) 97.6 F (36.4 C)  TempSrc: Oral  Oral Oral  SpO2: 100%  100% 100%  Weight:      Height:      PainSc:  1       Intake/Output Summary (Last 24 hours) at 12/08/2019 1637 Last data filed at 12/08/2019 1310 Gross per 24 hour  Intake 2639.18 ml  Output 0 ml  Net 2639.18 ml   Filed Weights    12/02/19 1543 12/03/19 0930  Weight: 55 kg 57.2 kg   Weight change:    Intake/Output from previous day: 08/15 0701 - 08/16 0700 In: 3505.8 [P.O.:840; I.V.:2665.8] Out: 0  Intake/Output this shift: Total I/O In: 480 [P.O.:480] Out: -   Examination:  General exam: AAO x3, NAD, weak appearing. HEENT:Oral mucosa moist, Ear/Nose WNL grossly, dentition normal. Respiratory system: bilaterally clear,no wheezing or crackles,no use of accessory muscle Cardiovascular system: S1 & S2 +, No JVD,. Gastrointestinal system: Abdomen soft, NT,ND, BS+ Nervous System:Alert, awake, moving extremities and grossly nonfocal Extremities: No edema, distal peripheral pulses palpable.  Skin: No rashes,no icterus. MSK: Normal muscle bulk,tone, power    Data Reviewed: I have personally reviewed following labs and imaging studies CBC: Recent Labs  Lab 12/02/19 2326 12/02/19 2326 12/02/19 2357 12/03/19 0301 12/04/19 0539 12/05/19 0345 12/07/19 0448 12/08/19 0557  WBC 33.1*  --   --   --  20.8* 13.3* 5.8 7.7  NEUTROABS  --   --  27.7*  --   --   --   --   --   HGB 14.5   < >  --  16.7* 13.1 11.8* 11.5* 11.3*  HCT 46.0   < >  --  49.0* 37.0 33.7* 33.7* 32.3*  MCV 99.6  --   --   --  89.8 89.9 92.3 90.5  PLT 435*  --   --   --  282 236 250 330   < > = values in this interval not displayed.   Basic Metabolic Panel: Recent Labs  Lab 12/04/19 0539 12/04/19 0539 12/05/19 0345 12/05/19 0345 12/06/19 0350 12/07/19 0448 12/07/19 1300 12/08/19 0557 12/08/19 1537  NA 139   < > 136   < > 136 135 135 134* 136  K 3.3*   < > 3.3*   < > 3.2* 3.7 3.1* 3.1* 3.4*  CL 113*   < > 107   < > 105 103 109 108 109  CO2 18*   < > 19*   < > 20* 16* 16* 14* 17*  GLUCOSE 124*   < > 245*   < > 177* 348* 184* 199* 106*  BUN 12   < > 8   < > 10 8 9 6  <5*  CREATININE 0.83   < > 0.75   < > 0.64 0.74 0.69 0.68 0.60  CALCIUM 8.6*   < > 8.4*   < > 8.0* 8.3* 8.2* 8.1* 8.2*  MG 1.9  --  2.1  --   --   --   --   --   --      < > =  values in this interval not displayed.   GFR: Estimated Creatinine Clearance: 88.9 mL/min (by C-G formula based on SCr of 0.6 mg/dL). Liver Function Tests: Recent Labs  Lab 12/07/19 0448 12/08/19 0557  AST 16 19  ALT 22 22  ALKPHOS 63 61  BILITOT 1.3* 0.9  PROT 6.3* 6.2*  ALBUMIN 3.5 3.4*   No results for input(s): LIPASE, AMYLASE in the last 168 hours. No results for input(s): AMMONIA in the last 168 hours. Coagulation Profile: No results for input(s): INR, PROTIME in the last 168 hours. Cardiac Enzymes: No results for input(s): CKTOTAL, CKMB, CKMBINDEX, TROPONINI in the last 168 hours. BNP (last 3 results) No results for input(s): PROBNP in the last 8760 hours. HbA1C: No results for input(s): HGBA1C in the last 72 hours. CBG: Recent Labs  Lab 12/07/19 1650 12/07/19 2137 12/08/19 0733 12/08/19 1117 12/08/19 1623  GLUCAP 114* 153* 179* 93 84   Lipid Profile: No results for input(s): CHOL, HDL, LDLCALC, TRIG, CHOLHDL, LDLDIRECT in the last 72 hours. Thyroid Function Tests: No results for input(s): TSH, T4TOTAL, FREET4, T3FREE, THYROIDAB in the last 72 hours. Anemia Panel: No results for input(s): VITAMINB12, FOLATE, FERRITIN, TIBC, IRON, RETICCTPCT in the last 72 hours. Sepsis Labs: No results for input(s): PROCALCITON, LATICACIDVEN in the last 168 hours.  Recent Results (from the past 240 hour(s))  SARS Coronavirus 2 by RT PCR (hospital order, performed in [facility] hospital lab) Nasopharyngeal Nasopharyngeal Swab     Status: None   Collection Time: 12/03/19  5:07 AM   Specimen: Nasopharyngeal Swab  Result Value Ref Range Status   SARS Coronavirus 2 NEGATIVE NEGATIVE Final    Comment: (NOTE) SARS-CoV-2 target nucleic acids are NOT DETECTED.  The SARS-CoV-2 RNA is generally detectable in upper and lower respiratory specimens during the acute phase of infection. The lowest concentration of SARS-CoV-2 viral copies this assay can detect is 250 copies /  mL. A negative result does not preclude SARS-CoV-2 infection and should not be used as the sole basis for treatment or other patient management decisions.  A negative result may occur with improper specimen collection / handling, submission of specimen other than nasopharyngeal swab, presence of viral mutation(s) within the areas targeted by this assay, and inadequate number of viral copies (<250 copies / mL). A negative result must be combined with clinical observations, patient history, and epidemiological information.  Fact Sheet for Patients:   BoilerBrush.com.cy  Fact Sheet for Healthcare Providers: https://pope.com/  This test is not yet approved or  cleared by the [place] FDA and has been authorized for detection and/or diagnosis of SARS-CoV-2 by FDA under an Emergency Use Authorization (EUA).  This EUA will remain in effect (meaning this test can be used) for the duration of the COVID-19 declaration under Section 564(b)(1) of the Act, 21 U.S.C. section 360bbb-3(b)(1), unless the authorization is terminated or revoked sooner.  Performed at [facility] [address], [place], [place] [address]   MRSA PCR Screening     Status: None   Collection Time: 12/03/19  9:31 AM   Specimen: Nasopharyngeal  Result Value Ref Range Status   MRSA by PCR NEGATIVE NEGATIVE Final    Comment:        The GeneXpert MRSA Assay (FDA approved for NASAL specimens only), is one component of a comprehensive MRSA colonization surveillance program. It is not intended to diagnose MRSA infection nor to guide or monitor treatment for MRSA infections. Performed at [facility], [address] [place], [place] [address]       Radiology Studies: No results found.   LOS: 5 days   Judy Boastamesh Jailynn Lavalais, MD [facility]  12/08/2019, 4:37 PM

## 2019-12-08 NOTE — Progress Notes (Signed)
Key Points: Use following P&T approved IV to PO non-antibiotic change policy.  Description contains the criteria that are approved Note: Policy Excludes:  Esophagectomy patientsPHARMACIST - PHYSICIAN COMMUNICATION DR:   [facility] CONCERNING: IV to Oral Route Change Policy  RECOMMENDATION: This patient is receiving protonix by the intravenous route.  Based on criteria approved by the Pharmacy and Therapeutics Committee, the intravenous medication(s) is/are being converted to the equivalent oral dose form(s).   DESCRIPTION: These criteria include:  The patient is eating (either orally or via tube) and/or has been taking other orally administered medications for a least 24 hours  The patient has no evidence of active gastrointestinal bleeding or impaired GI absorption (gastrectomy, short bowel, patient on TNA or NPO).  If you have questions about this conversion, please contact the Pharmacy Department  []   [phone] )  ( [phone] []   [phone] )   []   [phone] )  [facility] [x]   [phone] )  [facility]  [facility] [place] 12/08/2019 8:04 AM

## 2020-01-14 ENCOUNTER — Encounter: Payer: Federal, State, Local not specified - PPO | Attending: "Endocrinology | Admitting: Nutrition

## 2020-01-14 ENCOUNTER — Other Ambulatory Visit: Payer: Self-pay

## 2020-01-14 DIAGNOSIS — E1065 Type 1 diabetes mellitus with hyperglycemia: Secondary | ICD-10-CM | POA: Diagnosis not present

## 2020-01-16 NOTE — Progress Notes (Signed)
Patient was identified by name and DOB.  She thinks she is here today to learn about insulin pumps.  She believes this will make it easier/simpler to control her blood sugars. Discussed what is required before starting on a insulin pump:  1.  Must learn how to count carbs, 2. Must test blood sugars QID, 3. Must keep all MD appointments, and show insurance company that you are trying to control your blood sugars. Current therapy: Insulin dose:  23u Lantus (says forgets this dose at least once per week) Novolog:  10-11u acB, acL: correction dose only (dose not do this every day), acS-10-15u based on blood sugar reading..  This dose is based on not carbs eaten by by size of meal, and guessing for" what it will take to get it down"  SBGM:  Was on dexcom, but did "not like anyting attached to me".  Using Accu-Chek meter and does not like to test.  Did not bring meter.  Memory says FBSs100-250, acL 180-200, acS: 250-300. Typical day: 7AM: up  9AM: bfast: takes 10-11u based on blood sugar reading.  Will take 1-2extra units if blood sugar is high 1PM:  Does not eat lunch. Takes correction dose of insulin, without math. Snacks all afternoon on chips, nabs or "whatever" without any Novolog insulin dose 6-8PM: Supper takes 10-15u based on current blood sugar. "15u if high, and 10 if not eating much" 10-12AM  Snacks occasionally with no Novolog coverage. Discussion: 1.  She is playing catchup with her Novolog dose.  Novolog is not taken to bring blood sugars down, but rather to be take to prevent blood sugars from rising after meal.  The amount of this dose is directly related to how manu carbs she is eating, current blood sugar reading and activity..  We discussed how each one of the above 3 affects blood sugars. 2.  Need to count carbs.  Discussed what carbs were, but not detailed instruction of this.  She was given brochure and told that if she wants to be on an insulin pump, she must learn how to do this.   Suggested she download the [employer] app, with help for counting carbs of all foods and restaurant meals.  I suggested that she is 1u/10 carbs based on high HgbA1c and insulin resisitance.  She reported that she will need to see a dietitian for this with the OK from her that she knows how to do this before going on a pump. 3. Discussion on the importance to testing blood sugar also at HS, to correct the high readings, so that she is not high all night.  Stressed importance of this, in starting the day with a good reading, as well as dropping A1C significantly.  Discussed of how much insulin she will need, to reduce blood sugar reading based on a 1u/50 point drop over 100.  We did 2 examples of this and she reported good understanding  4.  Discussion on insulin pump therapy, and what is needed before starting, once on it, and the fact that this still requires putting in carbs for all meals, having something attached to her at all times/whether a pod, or pump, and the need to wear a sensor, or test ac and HS.  She is wanting a pump and agreed to all of the above. 5.  She was shown the 3 models of pumps, the 2 CGMS,  and we discussed the advantages and disadvantages of each model,  She was given brochures of  each model, and told to go on line to get more information, making sure she goes to the chat rooms and listens to other people on pumps.  Stessed that  "Pumps/CGMs are not perfect, and they they required contast attention"  She reported good understanding of this.

## 2020-01-16 NOTE — Patient Instructions (Addendum)
1.Make an appointment to learn carb counting 2.Give Novolog dose before all meals as well as snacks during the day- 3. Test blood sugar at bedtime and give a correction dose with current blood sugar, minus 100, divided by 50. 4. Return in 4 weeks

## 2020-08-15 ENCOUNTER — Other Ambulatory Visit: Payer: Self-pay

## 2020-08-15 ENCOUNTER — Ambulatory Visit
Admission: RE | Admit: 2020-08-15 | Discharge: 2020-08-15 | Disposition: A | Discharge status: Home / Self Care | Payer: Medicaid Other | Source: Ambulatory Visit | Attending: Emergency Medicine | Admitting: Emergency Medicine

## 2020-08-15 ENCOUNTER — Emergency Department (HOSPITAL_BASED_OUTPATIENT_CLINIC_OR_DEPARTMENT_OTHER)
Admission: EM | Admit: 2020-08-15 | Discharge: 2020-08-15 | Disposition: A | Discharge status: Home / Self Care | Payer: Federal, State, Local not specified - PPO | Attending: Emergency Medicine | Admitting: Emergency Medicine

## 2020-08-15 VITALS — BP 132/80 | HR 77 | Temp 98.2°F | Resp 18

## 2020-08-15 DIAGNOSIS — Z794 Long term (current) use of insulin: Secondary | ICD-10-CM | POA: Diagnosis not present

## 2020-08-15 DIAGNOSIS — Z28310 Unvaccinated for covid-19: Secondary | ICD-10-CM | POA: Diagnosis not present

## 2020-08-15 DIAGNOSIS — R52 Pain, unspecified: Secondary | ICD-10-CM

## 2020-08-15 DIAGNOSIS — R6883 Chills (without fever): Secondary | ICD-10-CM | POA: Diagnosis not present

## 2020-08-15 DIAGNOSIS — E1010 Type 1 diabetes mellitus with ketoacidosis without coma: Secondary | ICD-10-CM | POA: Diagnosis not present

## 2020-08-15 DIAGNOSIS — E1069 Type 1 diabetes mellitus with other specified complication: Secondary | ICD-10-CM

## 2020-08-15 DIAGNOSIS — R824 Acetonuria: Secondary | ICD-10-CM

## 2020-08-15 DIAGNOSIS — L539 Erythematous condition, unspecified: Secondary | ICD-10-CM | POA: Diagnosis not present

## 2020-08-15 DIAGNOSIS — U071 COVID-19: Secondary | ICD-10-CM | POA: Insufficient documentation

## 2020-08-15 DIAGNOSIS — Z20822 Contact with and (suspected) exposure to covid-19: Secondary | ICD-10-CM | POA: Diagnosis not present

## 2020-08-15 LAB — POCT FASTING CBG KUC MANUAL ENTRY: POCT Glucose (KUC): 165 mg/dL — AB (ref 70–99)

## 2020-08-15 LAB — BASIC METABOLIC PANEL
Anion gap: 11 (ref 5–15)
BUN: 8 mg/dL (ref 6–20)
CO2: 25 mmol/L (ref 22–32)
Calcium: 9.5 mg/dL (ref 8.9–10.3)
Chloride: 99 mmol/L (ref 98–111)
Creatinine, Ser: 0.72 mg/dL (ref 0.44–1.00)
GFR, Estimated: >60 mL/min (ref >60)
Glucose, Bld: 187 mg/dL — ABNORMAL HIGH (ref 70–99)
Potassium: 3.7 mmol/L (ref 3.5–5.1)
Sodium: 135 mmol/L (ref 135–145)

## 2020-08-15 LAB — URINALYSIS, ROUTINE W REFLEX MICROSCOPIC
Bilirubin Urine: NEGATIVE
Glucose, UA: 100 mg/dL — AB
Hgb urine dipstick: NEGATIVE
Ketones, ur: >80 mg/dL — AB
Leukocytes,Ua: NEGATIVE
Nitrite: NEGATIVE
Protein, ur: 30 mg/dL — AB
Specific Gravity, Urine: 1.022 (ref 1.005–1.030)
pH: 5.5 (ref 5.0–8.0)

## 2020-08-15 LAB — POCT URINALYSIS DIP (MANUAL ENTRY)
Bilirubin, UA: NEGATIVE
Blood, UA: NEGATIVE
Glucose, UA: NEGATIVE mg/dL
Leukocytes, UA: NEGATIVE
Nitrite, UA: NEGATIVE
Protein Ur, POC: 30 mg/dL — AB
Spec Grav, UA: 1.025 (ref 1.010–1.025)
Urobilinogen, UA: 0.2 E.U./dL
pH, UA: 5.5 (ref 5.0–8.0)

## 2020-08-15 LAB — CBG MONITORING, ED: Glucose-Capillary: 176 mg/dL — ABNORMAL HIGH (ref 70–99)

## 2020-08-15 LAB — CBC
HCT: 43.1 % (ref 36.0–46.0)
Hemoglobin: 14.3 g/dL (ref 12.0–15.0)
MCH: 31.3 pg (ref 26.0–34.0)
MCHC: 33.2 g/dL (ref 30.0–36.0)
MCV: 94.3 fL (ref 80.0–100.0)
Platelets: 344 10*3/uL (ref 150–400)
RBC: 4.57 MIL/uL (ref 3.87–5.11)
RDW: 13.2 % (ref 11.5–15.5)
WBC: 5.9 10*3/uL (ref 4.0–10.5)
nRBC: 0 % (ref 0.0–0.2)

## 2020-08-15 LAB — RESP PANEL BY RT-PCR (FLU A&B, COVID) ARPGX2
Influenza A by PCR: NEGATIVE
Influenza B by PCR: NEGATIVE
SARS Coronavirus 2 by RT PCR: POSITIVE — AB

## 2020-08-15 LAB — PREGNANCY, URINE: Preg Test, Ur: NEGATIVE

## 2020-08-15 MED ORDER — KETOROLAC TROMETHAMINE 30 MG/ML IJ SOLN
30.0000 mg | Freq: Once | INTRAMUSCULAR | Status: AC
  Administered 2020-08-15: 30 mg via INTRAMUSCULAR

## 2020-08-15 MED ORDER — LACTATED RINGERS IV BOLUS
1000.0000 mL | Freq: Once | INTRAVENOUS | Status: AC
  Administered 2020-08-15: 1000 mL via INTRAVENOUS

## 2020-08-15 MED ORDER — PAXLOVID 20 X 150 MG & 10 X 100MG PO TBPK: 1.0000 | ORAL_TABLET | Freq: Two times a day (BID) | ORAL | 0 refills | Status: AC

## 2020-08-15 MED ORDER — ONDANSETRON HCL 4 MG/2ML IJ SOLN
4.0000 mg | Freq: Once | INTRAMUSCULAR | Status: AC
  Administered 2020-08-15: 4 mg via INTRAVENOUS
  Filled 2020-08-15: qty 2

## 2020-08-15 NOTE — ED Provider Notes (Signed)
[facility]    CSN: [id number] Arrival date & time: 08/15/20  1122      History   Chief Complaint Chief Complaint  Patient presents with  . Diarrhea    HPI Judy Cook is a 20 y.o. female.   Judy Cook presents with complaints of body aches and feeling generally unwell. Noted it last night. Felt constipated last night but had two loose stools today. Nausea last night, no further. No emesis. Some sore throat this morning. No known fevers. No cough or congestion. Blood sugars have not necessarily been elevated for her. Normal urination. Back aches which causes her legs to ache. No known ill contacts. She did take an aleve which hasn't seemed to help with her symptoms. She has a history of type 1 diabetes and dka, cyclical vomiting, dysmenorrhea.     ROS per HPI, negative if not otherwise mentioned.      Past Medical History:  Diagnosis Date  . Deliberate self-cutting   . Diabetes mellitus [facility])     Patient Active Problem List   Diagnosis Date Noted  . Intractable nausea and vomiting 12/06/2019  . DKA (diabetic ketoacidoses) 12/03/2019  . SIRS (systemic inflammatory response syndrome) ([place]) 12/03/2019  . Hyperkalemia 12/03/2019  . Hyperglycemia   . Intractable vomiting   . Dysmenorrhea 02/20/2019  . Marijuana use 02/20/2019  . Cyclical vomiting 61/44/3154  . Hypokalemia 02/20/2019  . Hyperglycemia due to type 1 diabetes mellitus ([place]) 02/19/2019  . Type 1 diabetes mellitus without complication ([place]) 00/86/7619    History reviewed. No pertinent surgical history.  OB History   No obstetric history on file.      Home Medications    Prior to Admission medications   Medication Sig Start Date End Date Taking? Authorizing Provider  LANTUS SOLOSTAR 100 UNIT/ML Solostar Pen Inject 23 Units into the skin at bedtime. 02/04/19  Yes [provider]  NOVOLOG FLEXPEN 100 UNIT/ML FlexPen Inject 12 Units into the skin 3 (three)  times daily before meals. Ratio/sliding scale ( Adjust carb) 02/04/19  Yes [provider]  blood glucose meter kit and supplies Dispense based on patient and insurance preference. Use up to four times daily as directed. (FOR ICD-10 E10.9, E11.9). 02/22/19   Lurline Del, DO  metoCLOPramide (REGLAN) 10 MG tablet Take 1 tablet (10 mg total) by mouth 3 (three) times daily with meals for 7 days. 12/07/19 12/14/19  Antonieta Pert, MD  pantoprazole (PROTONIX) 40 MG tablet Take 1 tablet (40 mg total) by mouth daily. 12/07/19 01/06/20  Antonieta Pert, MD  potassium chloride 20 MEQ TBCR Take 20 mEq by mouth daily for 7 doses. 12/07/19 12/14/19  Antonieta Pert, MD    Family History Family History  Problem Relation Age of Onset  . Diabetes Paternal Grandfather     Social History Social History   Tobacco Use  . Smoking status: Never Smoker  . Smokeless tobacco: Never Used  Vaping Use  . Vaping Use: Never used  Substance Use Topics  . Alcohol use: Never  . Drug use: Never     Allergies   Patient has no known allergies.   Review of Systems Review of Systems   Physical Exam Triage Vital Signs ED Triage Vitals  Enc Vitals Group     BP 08/15/20 1229 132/80     Pulse Rate 08/15/20 1229 77     Resp 08/15/20 1229 18     Temp 08/15/20 1229 98.2 F (36.8 C)     Temp Source  08/15/20 1229 Oral     SpO2 08/15/20 1229 98 %     Weight --      Height --      Head Circumference --      Peak Flow --      Pain Score 08/15/20 1226 7     Pain Loc --      Pain Edu? --      Excl. in [place]? --    No data found.  Updated Vital Signs BP 132/80 (BP Location: Left Arm)   Pulse 77   Temp 98.2 F (36.8 C) (Oral)   Resp 18   LMP 08/05/2020   SpO2 98%   Visual Acuity Right Eye Distance:   Left Eye Distance:   Bilateral Distance:    Right Eye Near:   Left Eye Near:    Bilateral Near:     Physical Exam Constitutional:      General: She is not in acute distress.    Appearance: She is  well-developed.     Comments: Patient crying prior to my entry into room; during history and exam patient appears comfortable, sitting on exam table in no distress   HENT:     Mouth/Throat:     Pharynx: Oropharynx is clear.  Eyes:     Pupils: Pupils are equal, round, and reactive to light.  Cardiovascular:     Rate and Rhythm: Normal rate.  Pulmonary:     Effort: Pulmonary effort is normal.  Abdominal:     Tenderness: There is no abdominal tenderness.  Skin:    General: Skin is warm and dry.  Neurological:     Mental Status: She is alert and oriented to person, place, and time.      UC Treatments / Results  Labs (all labs ordered are listed, but only abnormal results are displayed) Labs Reviewed  POCT FASTING CBG [facility] MANUAL ENTRY - Abnormal; Notable for the following components:      Result Value   POCT Glucose ([facility]) 165 (*)    All other components within normal limits  POCT URINALYSIS DIP (MANUAL ENTRY) - Abnormal; Notable for the following components:   Clarity, UA hazy (*)    Ketones, POC UA large (80) (*)    Protein Ur, POC =30 (*)    All other components within normal limits  COVID-19, FLU A+B NAA    EKG   Radiology No results found.  Procedures Procedures (including critical care time)  Medications Ordered in UC Medications  ketorolac (TORADOL) 30 MG/ML injection 30 mg (30 mg Intramuscular Given 08/15/20 1324)    Initial Impression / Assessment and Plan / UC Course  I have reviewed the triage vital signs and the nursing notes.  Pertinent labs & imaging results that were available during my care of the patient were reviewed by me and considered in my medical decision making (see chart for details).     In further discussion patient states that she feels a "head fogginess" which is similar to DKA episodes she has in the past. Blood sugar looks well here today actually, and no glucose to urine. Large ketones, however, and vague symptoms which patient  describes as similar to previous DKA. Unfortunately at this specific UC lab turn around is hours, which is not helpful in further evaluating for DKA. Patient verbalized understanding of this, and in joint decision making she is agreeable to going to the ER for further evaluation.  Final Clinical Impressions(s) / UC Diagnoses   Final  diagnoses:  Body aches  Ketonuria  Type 1 diabetes mellitus with other specified complication [facility])     Discharge Instructions     I do feel that further lab testing is needed and given that you feel similar to previous DKA instances I recommend that you go to the ER now for further evaluation.     ED Prescriptions    None     PDMP not reviewed this encounter.   Zigmund Gottron, NP 08/15/20 1343

## 2020-08-15 NOTE — ED Provider Notes (Signed)
  Physical Exam  BP (!) 135/95 (BP Location: Right Arm)   Pulse 85   Temp 98.4 F (36.9 C) (Oral)   Resp 16   Ht 5' (1.524 m)   Wt 56.7 kg   LMP 08/05/2020   SpO2 100%   BMI 24.41 kg/m   Physical Exam  ED Course/Procedures     Procedures  MDM  This patient is COVID-19 positive.  She will be prescribed Paxil event.  She is suitable for discharge home based on her normal vital signs.  No evidence of DKA.  She does have health insurance.  She was given return precautions.  She was also given instructions about quarantine.       Koleen Distance, MD 08/15/20 [phone]

## 2020-08-15 NOTE — ED Notes (Signed)
CRITICAL VALUE STICKER  CRITICAL VALUE:COVID +  RECEIVER (on-site recipient of call):Carmon Ginsberg, RN  DATE & TIME NOTIFIED: 08-15-2020 1528  MESSENGER (representative from lab):  MD NOTIFIED: Dr. Delford Field  TIME OF NOTIFICATION:1528  RESPONSE:

## 2020-08-15 NOTE — Discharge Instructions (Signed)
I do feel that further lab testing is needed and given that you feel similar to previous DKA instances I recommend that you go to the ER now for further evaluation.

## 2020-08-15 NOTE — ED Provider Notes (Signed)
[place] EMERGENCY DEPT Provider Note   CSN: [id number] Arrival date & time: 08/15/20  1408     History Chief Complaint  Patient presents with  . Chills    Judy Cook is a 20 y.o. female.  Patient is a 20 year old female with a history of type 1 diabetes, prior DKA, cyclical vomiting syndrome and prior sepsis who is presenting today from urgent care for evaluation.  Patient reports starting yesterday she had chills, fatigue, sore throat and some congestion.  She did not check her temperature but did not feel like she was febrile.  She denies any cough or shortness of breath.  She has had some mild abdominal soreness today and nausea but denies any vomiting.  She had one episode of diarrhea this morning and has not eaten anything.  She continues to use her insulin as prescribed.  At urgent care they were concerned she may be in DKA because she did have ketones in her urine and sent her here for further evaluation.  She does not have any known sick contacts but has not received the COVID or flu vaccine.  The history is provided by the patient.       Past Medical History:  Diagnosis Date  . Deliberate self-cutting   . Diabetes mellitus [facility])     Patient Active Problem List   Diagnosis Date Noted  . Intractable nausea and vomiting 12/06/2019  . DKA (diabetic ketoacidoses) 12/03/2019  . SIRS (systemic inflammatory response syndrome) ([place]) 12/03/2019  . Hyperkalemia 12/03/2019  . Hyperglycemia   . Intractable vomiting   . Dysmenorrhea 02/20/2019  . Marijuana use 02/20/2019  . Cyclical vomiting 00/93/8182  . Hypokalemia 02/20/2019  . Hyperglycemia due to type 1 diabetes mellitus ([place]) 02/19/2019  . Type 1 diabetes mellitus without complication ([place]) 99/37/1696    No past surgical history on file.   OB History   No obstetric history on file.     Family History  Problem Relation Age of Onset  . Diabetes Paternal Grandfather     Social  History   Tobacco Use  . Smoking status: Never Smoker  . Smokeless tobacco: Never Used  Vaping Use  . Vaping Use: Never used  Substance Use Topics  . Alcohol use: Never  . Drug use: Never    Home Medications Prior to Admission medications   Medication Sig Start Date End Date Taking? Authorizing Provider  blood glucose meter kit and supplies Dispense based on patient and insurance preference. Use up to four times daily as directed. (FOR ICD-10 E10.9, E11.9). 02/22/19   Welborn, Ryan, DO  LANTUS SOLOSTAR 100 UNIT/ML Solostar Pen Inject 23 Units into the skin at bedtime. 02/04/19   [provider]  metoCLOPramide (REGLAN) 10 MG tablet Take 1 tablet (10 mg total) by mouth 3 (three) times daily with meals for 7 days. 12/07/19 12/14/19  Antonieta Pert, MD  NOVOLOG FLEXPEN 100 UNIT/ML FlexPen Inject 12 Units into the skin 3 (three) times daily before meals. Ratio/sliding scale ( Adjust carb) 02/04/19   [provider]  pantoprazole (PROTONIX) 40 MG tablet Take 1 tablet (40 mg total) by mouth daily. 12/07/19 01/06/20  Antonieta Pert, MD  potassium chloride 20 MEQ TBCR Take 20 mEq by mouth daily for 7 doses. 12/07/19 12/14/19  Antonieta Pert, MD    Allergies    Patient has no known allergies.  Review of Systems   Review of Systems  All other systems reviewed and are negative.   Physical Exam Updated Vital  Signs BP (!) 149/80 (BP Location: Left Arm)   Pulse 78   Temp 98.4 F (36.9 C) (Oral)   Resp 18   Ht 5' (1.524 m)   Wt 56.7 kg   LMP 08/05/2020   SpO2 99%   BMI 24.41 kg/m   Physical Exam Vitals and nursing note reviewed.  Constitutional:      General: She is not in acute distress.    Appearance: Normal appearance. She is well-developed.  HENT:     Head: Normocephalic and atraumatic.     Right Ear: Tympanic membrane normal.     Left Ear: Tympanic membrane normal.     Nose: Nose normal.     Mouth/Throat:     Mouth: Mucous membranes are dry.     Pharynx: Posterior  oropharyngeal erythema present. No oropharyngeal exudate.  Eyes:     Pupils: Pupils are equal, round, and reactive to light.  Cardiovascular:     Rate and Rhythm: Normal rate and regular rhythm.     Pulses: Normal pulses.     Heart sounds: Normal heart sounds. No murmur heard. No friction rub.  Pulmonary:     Effort: Pulmonary effort is normal.     Breath sounds: Normal breath sounds. No wheezing or rales.  Abdominal:     General: Bowel sounds are normal. There is no distension.     Palpations: Abdomen is soft.     Tenderness: There is no abdominal tenderness. There is no guarding or rebound.  Musculoskeletal:        General: No tenderness. Normal range of motion.     Cervical back: Normal range of motion and neck supple. No tenderness.     Right lower leg: No edema.     Left lower leg: No edema.     Comments: No edema  Skin:    General: Skin is warm and dry.     Findings: No rash.  Neurological:     Mental Status: She is alert and oriented to person, place, and time. Mental status is at baseline.     Cranial Nerves: No cranial nerve deficit.  Psychiatric:        Mood and Affect: Mood normal.        Behavior: Behavior normal.        Thought Content: Thought content normal.     ED Results / Procedures / Treatments   Labs (all labs ordered are listed, but only abnormal results are displayed) Labs Reviewed  CBG MONITORING, ED - Abnormal; Notable for the following components:      Result Value   Glucose-Capillary 176 (*)    All other components within normal limits  RESP PANEL BY RT-PCR (FLU A&B, COVID) ARPGX2  CBC  BASIC METABOLIC PANEL  URINALYSIS, ROUTINE W REFLEX MICROSCOPIC  PREGNANCY, URINE    EKG None  Radiology No results found.  Procedures Procedures   Medications Ordered in ED Medications  ondansetron (ZOFRAN) injection 4 mg (has no administration in time range)  lactated ringers bolus 1,000 mL (has no administration in time range)    ED Course   I have reviewed the triage vital signs and the nursing notes.  Pertinent labs & imaging results that were available during my care of the patient were reviewed by me and considered in my medical decision making (see chart for details).    MDM Rules/Calculators/A&P  Patient presenting today due to flulike symptoms that started last night with some nausea today and diarrhea.  Patient has not had anything to eat and does have a history of type 1 diabetes.  Patient's blood sugar today is 176 but at urgent care she did have large ketones in her urine.  She has no specific abdominal pain or concern for an acute abdominal process.  UA at urgent care did not show evidence of infection.  On exam patient does have some mild erythema and could be having early COVID or flu.  CBC is within normal limits.  Patient's temperature and pulse are within normal limits.  Will give IV fluids and Zofran.  Labs are still pending  Final Clinical Impression(s) / ED Diagnoses Final diagnoses:  None    Rx / DC Orders ED Discharge Orders    None       Blanchie Dessert, MD 08/15/20 1535

## 2020-08-15 NOTE — ED Triage Notes (Signed)
Patient reports she was sent from urgent care to R/o DKA. Patient's CBG was 187 at UC. [place] reports she was told she had ketones in urine.

## 2020-08-15 NOTE — ED Triage Notes (Signed)
Patient states symptoms started last night.  Complains of chills and body aches last night.  Today having diarrhea

## 2020-08-16 ENCOUNTER — Telehealth (HOSPITAL_COMMUNITY): Payer: Self-pay

## 2020-08-16 NOTE — Telephone Encounter (Signed)
Called to discuss with patient about COVID-19 symptoms and the use of one of the available treatments for those with mild to moderate Covid symptoms and at a high risk of hospitalization.  Pt appears to qualify for outpatient treatment due to co-morbid conditions and/or a member of an at-risk group in accordance with the FDA Emergency Use Authorization.    Symptom onset: Saturday 08/14/20, pt c/o leg soreness and abdominal pain Vaccinated: No Booster: No Immunocompromised: No Qualifiers: DM1 NIH Criteria: Tier 2  Pt stated she was prescribed paxlovid in the ED.    Symptoms tier reviewed as well as criteria for ending isolation. Preventative practices reviewed. Patient verbalized understanding. Patient advised to go to Urgent care or ED with severe symptoms.    Essie Hart, RN

## 2020-08-17 LAB — COVID-19, FLU A+B NAA
Influenza A, NAA: NOT DETECTED
Influenza B, NAA: NOT DETECTED
SARS-CoV-2, NAA: DETECTED — AB

## 2020-09-30 ENCOUNTER — Other Ambulatory Visit: Payer: Self-pay

## 2020-09-30 ENCOUNTER — Emergency Department (HOSPITAL_BASED_OUTPATIENT_CLINIC_OR_DEPARTMENT_OTHER)
Admission: EM | Admit: 2020-09-30 | Discharge: 2020-09-30 | Disposition: A | Discharge status: Home / Self Care | Payer: Federal, State, Local not specified - PPO | Attending: Emergency Medicine | Admitting: Emergency Medicine

## 2020-09-30 ENCOUNTER — Encounter (HOSPITAL_BASED_OUTPATIENT_CLINIC_OR_DEPARTMENT_OTHER): Payer: Self-pay | Admitting: Emergency Medicine

## 2020-09-30 DIAGNOSIS — E119 Type 2 diabetes mellitus without complications: Secondary | ICD-10-CM | POA: Diagnosis not present

## 2020-09-30 DIAGNOSIS — F12188 Cannabis abuse with other cannabis-induced disorder: Secondary | ICD-10-CM | POA: Diagnosis not present

## 2020-09-30 DIAGNOSIS — R1111 Vomiting without nausea: Secondary | ICD-10-CM | POA: Diagnosis not present

## 2020-09-30 DIAGNOSIS — R112 Nausea with vomiting, unspecified: Secondary | ICD-10-CM | POA: Diagnosis not present

## 2020-09-30 DIAGNOSIS — Z794 Long term (current) use of insulin: Secondary | ICD-10-CM | POA: Insufficient documentation

## 2020-09-30 DIAGNOSIS — F1290 Cannabis use, unspecified, uncomplicated: Secondary | ICD-10-CM

## 2020-09-30 DIAGNOSIS — R1030 Lower abdominal pain, unspecified: Secondary | ICD-10-CM | POA: Diagnosis not present

## 2020-09-30 DIAGNOSIS — R110 Nausea: Secondary | ICD-10-CM | POA: Diagnosis not present

## 2020-09-30 DIAGNOSIS — I10 Essential (primary) hypertension: Secondary | ICD-10-CM | POA: Diagnosis not present

## 2020-09-30 LAB — COMPREHENSIVE METABOLIC PANEL
ALT: 16 U/L (ref 0–44)
AST: 23 U/L (ref 15–41)
Albumin: 4.3 g/dL (ref 3.5–5.0)
Alkaline Phosphatase: 68 U/L (ref 38–126)
Anion gap: 10 (ref 5–15)
BUN: 9 mg/dL (ref 6–20)
CO2: 27 mmol/L (ref 22–32)
Calcium: 9.3 mg/dL (ref 8.9–10.3)
Chloride: 104 mmol/L (ref 98–111)
Creatinine, Ser: 0.75 mg/dL (ref 0.44–1.00)
GFR, Estimated: >60 mL/min (ref >60)
Glucose, Bld: 149 mg/dL — ABNORMAL HIGH (ref 70–99)
Potassium: 3.4 mmol/L — ABNORMAL LOW (ref 3.5–5.1)
Sodium: 141 mmol/L (ref 135–145)
Total Bilirubin: 0.4 mg/dL (ref 0.3–1.2)
Total Protein: 7.1 g/dL (ref 6.5–8.1)

## 2020-09-30 LAB — CBC WITH DIFFERENTIAL/PLATELET
Abs Immature Granulocytes: 0.03 10*3/uL (ref 0.00–0.07)
Basophils Absolute: 0 10*3/uL (ref 0.0–0.1)
Basophils Relative: 0 %
Eosinophils Absolute: 0.1 10*3/uL (ref 0.0–0.5)
Eosinophils Relative: 1 %
HCT: 41.4 % (ref 36.0–46.0)
Hemoglobin: 14.3 g/dL (ref 12.0–15.0)
Immature Granulocytes: 1 %
Lymphocytes Relative: 14 %
Lymphs Abs: 0.9 10*3/uL (ref 0.7–4.0)
MCH: 32.3 pg (ref 26.0–34.0)
MCHC: 34.5 g/dL (ref 30.0–36.0)
MCV: 93.5 fL (ref 80.0–100.0)
Monocytes Absolute: 0.4 10*3/uL (ref 0.1–1.0)
Monocytes Relative: 6 %
Neutro Abs: 5.1 10*3/uL (ref 1.7–7.7)
Neutrophils Relative %: 78 %
Platelets: 396 10*3/uL (ref 150–400)
RBC: 4.43 MIL/uL (ref 3.87–5.11)
RDW: 13.2 % (ref 11.5–15.5)
WBC: 6.6 10*3/uL (ref 4.0–10.5)
nRBC: 0 % (ref 0.0–0.2)

## 2020-09-30 LAB — URINALYSIS, ROUTINE W REFLEX MICROSCOPIC
Bilirubin Urine: NEGATIVE
Glucose, UA: 250 mg/dL — AB
Ketones, ur: NEGATIVE mg/dL
Leukocytes,Ua: NEGATIVE
Nitrite: NEGATIVE
Protein, ur: 100 mg/dL — AB
RBC / HPF: >50 RBC/hpf — ABNORMAL HIGH (ref 0–5)
Specific Gravity, Urine: 1.019 (ref 1.005–1.030)
pH: 8.5 — ABNORMAL HIGH (ref 5.0–8.0)

## 2020-09-30 LAB — LIPASE, BLOOD: Lipase: <10 U/L — ABNORMAL LOW (ref 11–51)

## 2020-09-30 LAB — PREGNANCY, URINE: Preg Test, Ur: NEGATIVE

## 2020-09-30 MED ORDER — ONDANSETRON 4 MG PO TBDP: 4.0000 mg | ORAL_TABLET | Freq: Three times a day (TID) | ORAL | 0 refills | Status: DC | PRN

## 2020-09-30 MED ORDER — LACTATED RINGERS IV BOLUS
1000.0000 mL | Freq: Once | INTRAVENOUS | Status: AC
  Administered 2020-09-30: 1000 mL via INTRAVENOUS

## 2020-09-30 MED ORDER — HALOPERIDOL LACTATE 5 MG/ML IJ SOLN
5.0000 mg | Freq: Once | INTRAMUSCULAR | Status: AC
  Administered 2020-09-30: 5 mg via INTRAVENOUS
  Filled 2020-09-30: qty 1

## 2020-09-30 MED ORDER — ONDANSETRON HCL 4 MG/2ML IJ SOLN
4.0000 mg | Freq: Once | INTRAMUSCULAR | Status: AC
  Administered 2020-09-30: 4 mg via INTRAVENOUS
  Filled 2020-09-30: qty 2

## 2020-09-30 NOTE — ED Notes (Signed)
Patient given ginger ale for PO fluid challenge.  

## 2020-09-30 NOTE — ED Triage Notes (Signed)
Pt arrives to ED with c/o of dysmenorrhea and nausea/vomiting that started this morning.

## 2020-09-30 NOTE — ED Provider Notes (Signed)
[facility] Provider Note  CSN: [id number] Arrival date & time: 09/30/20 4469    History Chief Complaint  Patient presents with   Abdominal Pain    HPI  Judy Cook is a 20 y.o. female with history of Type 1 DM reports onset of lower abdominal cramping this morning with her menses. She has had numerous episodes of vomiting along with it. She think she may have seen some streaks of blood. She has been drinking alcohol recently to celebrate the end of her college classes. She also smokes marijuana frequently. She has had similar symptoms before that she attributes to her menstrual cycle but has also been told it could be due to her marijuana use.    Past Medical History:  Diagnosis Date   Deliberate self-cutting    Diabetes mellitus ([place])     History reviewed. No pertinent surgical history.  Family History  Problem Relation Age of Onset   Diabetes Paternal Grandfather     Social History   Tobacco Use   Smoking status: Never   Smokeless tobacco: Never  Vaping Use   Vaping Use: Never used  Substance Use Topics   Alcohol use: Never   Drug use: Never     Home Medications Prior to Admission medications   Medication Sig Start Date End Date Taking? Authorizing Provider  ondansetron (ZOFRAN ODT) 4 MG disintegrating tablet Take 1 tablet (4 mg total) by mouth every 8 (eight) hours as needed for nausea or vomiting. 09/30/20  Yes Truddie Hidden, MD  blood glucose meter kit and supplies Dispense based on patient and insurance preference. Use up to four times daily as directed. (FOR ICD-10 E10.9, E11.9). 02/22/19   Welborn, Ryan, DO  LANTUS SOLOSTAR 100 UNIT/ML Solostar Pen Inject 23 Units into the skin at bedtime. 02/04/19   [provider]  NOVOLOG FLEXPEN 100 UNIT/ML FlexPen Inject 12 Units into the skin 3 (three) times daily before meals. Ratio/sliding scale ( Adjust carb) 02/04/19   [provider]  pantoprazole  (PROTONIX) 40 MG tablet Take 1 tablet (40 mg total) by mouth daily. 12/07/19 01/06/20  Antonieta Pert, MD  potassium chloride 20 MEQ TBCR Take 20 mEq by mouth daily for 7 doses. 12/07/19 12/14/19  Antonieta Pert, MD     Allergies    Patient has no known allergies.   Review of Systems   Review of Systems A comprehensive review of systems was completed and negative except as noted in HPI.    Physical Exam BP 120/60 (BP Location: Right Arm)   Pulse 70   Temp 97.9 F (36.6 C) (Oral)   Resp 16   Ht 5' (1.524 m)   Wt 54.4 kg   LMP 09/30/2020 (Exact Date)   SpO2 98%   BMI 23.44 kg/m   Physical Exam Vitals and nursing note reviewed.  Constitutional:      Appearance: Normal appearance.  HENT:     Head: Normocephalic and atraumatic.     Nose: Nose normal.     Mouth/Throat:     Mouth: Mucous membranes are moist.  Eyes:     Extraocular Movements: Extraocular movements intact.     Conjunctiva/sclera: Conjunctivae normal.  Cardiovascular:     Rate and Rhythm: Normal rate.  Pulmonary:     Effort: Pulmonary effort is normal.     Breath sounds: Normal breath sounds.  Abdominal:     General: Abdomen is flat.     Palpations: Abdomen is soft. There is no mass.  Tenderness: There is abdominal tenderness (diffuse). There is no guarding or rebound.  Musculoskeletal:        General: No swelling. Normal range of motion.     Cervical back: Neck supple.  Skin:    General: Skin is warm and dry.  Neurological:     General: No focal deficit present.     Mental Status: She is alert.  Psychiatric:        Mood and Affect: Mood normal.     ED Results / Procedures / Treatments   Labs (all labs ordered are listed, but only abnormal results are displayed) Labs Reviewed  COMPREHENSIVE METABOLIC PANEL - Abnormal; Notable for the following components:      Result Value   Potassium 3.4 (*)    Glucose, Bld 149 (*)    All other components within normal limits  LIPASE, BLOOD - Abnormal; Notable for  the following components:   Lipase <10 (*)    All other components within normal limits  URINALYSIS, ROUTINE W REFLEX MICROSCOPIC - Abnormal; Notable for the following components:   APPearance HAZY (*)    pH 8.5 (*)    Glucose, UA 250 (*)    Hgb urine dipstick LARGE (*)    Protein, ur 100 (*)    RBC / HPF >50 (*)    Bacteria, UA FEW (*)    All other components within normal limits  CBC WITH DIFFERENTIAL/PLATELET  PREGNANCY, URINE    EKG None   Radiology No results found.  Procedures Procedures  Medications Ordered in the ED Medications  ondansetron (ZOFRAN) injection 4 mg (4 mg Intravenous Given 09/30/20 0754)  lactated ringers bolus 1,000 mL (0 mLs Intravenous Stopped 09/30/20 0828)  haloperidol lactate (HALDOL) injection 5 mg (5 mg Intravenous Given 09/30/20 0825)     MDM Rules/Calculators/A&P MDM  Patient with mildly elevated glucose with EMS ~200 will check labs for signs of DKA, IVF, antiemetics.  ED Course  I have reviewed the triage vital signs and the nursing notes.  Pertinent labs & imaging results that were available during my care of the patient were reviewed by me and considered in my medical decision making (see chart for details).  Clinical Course as of 09/30/20 1451  Thu Sep 30, 2020  0753 CBC is normal.  [CS]  0817 CMP and Lipase are unremarkable. No signs of DKA.  [CS]  0841 UA with blood, likely contaminated from menses, no signs of infection.  [CS]  8828 HCG is neg.  [CS]  [id number] Patient with continued gagging and pain after Zofran and IVF. Given Haldol and now resting comfortably. Sleeping soundly. Will attempt PO trial. Suspect her symptoms are due to cannabinoid use, less likely due to dysmenorrhea.  [CS]  0034 Patient feeling much better, awake and talking. Tolerating PO fluids and ready for discharge. Advised to curtail her marijuana use. PCP follow up. Rx for Zofran as needed.  [CS]    Clinical Course User Index [CS] Truddie Hidden, MD     Final Clinical Impression(s) / ED Diagnoses Final diagnoses:  Cannabinoid hyperemesis syndrome    Rx / DC Orders ED Discharge Orders          Ordered    ondansetron (ZOFRAN ODT) 4 MG disintegrating tablet  Every 8 hours PRN        09/30/20 1014             Truddie Hidden, MD 09/30/20 1451

## 2020-09-30 NOTE — ED Notes (Signed)
Pt ambulatory to waiting room. Pt verbalized understanding of discharge instructions.   

## 2020-11-24 ENCOUNTER — Encounter (HOSPITAL_COMMUNITY): Payer: Self-pay

## 2020-11-24 ENCOUNTER — Other Ambulatory Visit: Payer: Self-pay

## 2020-11-24 ENCOUNTER — Emergency Department (HOSPITAL_COMMUNITY)
Admission: EM | Admit: 2020-11-24 | Discharge: 2020-11-24 | Disposition: A | Discharge status: Home / Self Care | Payer: Federal, State, Local not specified - PPO | Attending: Emergency Medicine | Admitting: Emergency Medicine

## 2020-11-24 DIAGNOSIS — R739 Hyperglycemia, unspecified: Secondary | ICD-10-CM

## 2020-11-24 DIAGNOSIS — E1165 Type 2 diabetes mellitus with hyperglycemia: Secondary | ICD-10-CM | POA: Diagnosis not present

## 2020-11-24 DIAGNOSIS — Z794 Long term (current) use of insulin: Secondary | ICD-10-CM | POA: Diagnosis not present

## 2020-11-24 DIAGNOSIS — Z760 Encounter for issue of repeat prescription: Secondary | ICD-10-CM | POA: Diagnosis not present

## 2020-11-24 DIAGNOSIS — E1065 Type 1 diabetes mellitus with hyperglycemia: Secondary | ICD-10-CM | POA: Diagnosis not present

## 2020-11-24 DIAGNOSIS — E1010 Type 1 diabetes mellitus with ketoacidosis without coma: Secondary | ICD-10-CM | POA: Insufficient documentation

## 2020-11-24 LAB — CBG MONITORING, ED: Glucose-Capillary: 580 mg/dL (ref 70–99)

## 2020-11-24 MED ORDER — LANTUS SOLOSTAR 100 UNIT/ML ~~LOC~~ SOPN: 23.0000 [IU] | PEN_INJECTOR | Freq: Every day | SUBCUTANEOUS | 0 refills | Status: DC

## 2020-11-24 MED ORDER — NOVOLOG FLEXPEN 100 UNIT/ML ~~LOC~~ SOPN: 12.0000 [IU] | PEN_INJECTOR | Freq: Three times a day (TID) | SUBCUTANEOUS | 0 refills | Status: DC

## 2020-11-24 NOTE — Care Management (Signed)
ED CM spoke with the patient via telephone following discharge from the ED. CM informed the patient she can locate in-network PCPs accepting [employer] by visiting her health plan's web site and selecting the option to find a doctor. CM also provided the names of two PCP practices with multiple locations that may take her insurance [employer] and [place]).

## 2020-11-24 NOTE — ED Triage Notes (Signed)
Patient here requesting insulin prescription. States that she ran out today and here for school and rx expired

## 2020-11-24 NOTE — Discharge Instructions (Addendum)
Call the number on the back of your insurance card to help set up primary care. I have reached out to our social work team, they may be contacting you regarding primary care assistance. Take insulin as prescribed. Continue to check your sugars regularly. Return to the emergency room with any new, worsening, concerning symptoms.

## 2020-11-24 NOTE — ED Provider Notes (Signed)
[facility] EMERGENCY DEPARTMENT Provider Note   CSN: [id number] Arrival date & time: 11/24/20  1758     History No chief complaint on file.   Judy Cook is a 20 y.o. female presenting for medication refill.  Patient states he has a history of type 1 diabetes.  She is not from [place] does not have a primary care doctor here to refill her medications.  She denies confusion, weakness, nausea, vomiting, abdominal pain, urinary symptoms, normal bowel movements.  No other medical problems.  She is on NovoLog and Lantus  HPI     Past Medical History:  Diagnosis Date   Deliberate self-cutting    Diabetes mellitus ([place])     Patient Active Problem List   Diagnosis Date Noted   Intractable nausea and vomiting 12/06/2019   DKA (diabetic ketoacidoses) 12/03/2019   SIRS (systemic inflammatory response syndrome) ([facility]) 12/03/2019   Hyperkalemia 12/03/2019   Hyperglycemia    Intractable vomiting    Dysmenorrhea 02/20/2019   Marijuana use 28/31/5176   Cyclical vomiting 16/10/3708   Hypokalemia 02/20/2019   Hyperglycemia due to type 1 diabetes mellitus ([place]) 02/19/2019   Type 1 diabetes mellitus without complication ([place]) 62/69/4854    History reviewed. No pertinent surgical history.   OB History   No obstetric history on file.     Family History  Problem Relation Age of Onset   Diabetes Paternal Grandfather     Social History   Tobacco Use   Smoking status: Never   Smokeless tobacco: Never  Vaping Use   Vaping Use: Never used  Substance Use Topics   Alcohol use: Never   Drug use: Never    Home Medications Prior to Admission medications   Medication Sig Start Date End Date Taking? Authorizing Provider  blood glucose meter kit and supplies Dispense based on patient and insurance preference. Use up to four times daily as directed. (FOR ICD-10 E10.9, E11.9). 02/22/19   Welborn, Ryan, DO  LANTUS SOLOSTAR 100 UNIT/ML Solostar Pen Inject  23 Units into the skin at bedtime. 11/24/20   Trevar Boehringer, PA-C  NOVOLOG FLEXPEN 100 UNIT/ML FlexPen Inject 12 Units into the skin 3 (three) times daily before meals. Ratio/sliding scale ( Adjust carb) 11/24/20   Kalisi Bevill, PA-C  ondansetron (ZOFRAN ODT) 4 MG disintegrating tablet Take 1 tablet (4 mg total) by mouth every 8 (eight) hours as needed for nausea or vomiting. 09/30/20   Truddie Hidden, MD  pantoprazole (PROTONIX) 40 MG tablet Take 1 tablet (40 mg total) by mouth daily. 12/07/19 01/06/20  Antonieta Pert, MD  potassium chloride 20 MEQ TBCR Take 20 mEq by mouth daily for 7 doses. 12/07/19 12/14/19  Antonieta Pert, MD    Allergies    Patient has no known allergies.  Review of Systems   Review of Systems  All other systems reviewed and are negative.  Physical Exam Updated Vital Signs BP (!) 146/90   Pulse 99   Temp 98.7 F (37.1 C) (Oral)   Resp 18   SpO2 100%   Physical Exam Vitals and nursing note reviewed.  Constitutional:      General: She is not in acute distress.    Appearance: Normal appearance.  HENT:     Head: Normocephalic and atraumatic.  Eyes:     Conjunctiva/sclera: Conjunctivae normal.     Pupils: Pupils are equal, round, and reactive to light.  Cardiovascular:     Rate and Rhythm: Normal rate and regular rhythm.  Pulses: Normal pulses.  Pulmonary:     Effort: Pulmonary effort is normal. No respiratory distress.     Breath sounds: Normal breath sounds. No wheezing.     Comments: Speaking in full sentences.  Clear lung sounds in all fields. Abdominal:     General: There is no distension.     Palpations: Abdomen is soft.     Tenderness: There is no abdominal tenderness.  Musculoskeletal:        General: Normal range of motion.     Cervical back: Normal range of motion and neck supple.  Skin:    General: Skin is warm and dry.     Capillary Refill: Capillary refill takes less than 2 seconds.  Neurological:     Mental Status: She is alert and  oriented to person, place, and time.  Psychiatric:        Mood and Affect: Mood and affect normal.        Speech: Speech normal.        Behavior: Behavior normal.    ED Results / Procedures / Treatments   Labs (all labs ordered are listed, but only abnormal results are displayed) Labs Reviewed  CBG MONITORING, ED - Abnormal; Notable for the following components:      Result Value   Glucose-Capillary 580 (*)    All other components within normal limits    EKG None  Radiology No results found.  Procedures Procedures   Medications Ordered in ED Medications - No data to display  ED Course  I have reviewed the triage vital signs and the nursing notes.  Pertinent labs & imaging results that were available during my care of the patient were reviewed by me and considered in my medical decision making (see chart for details).    MDM Rules/Calculators/A&P                           Patient presenting for evaluation of medication refill.  On exam, patient peers nontoxic.  She reports no symptoms.  She is out of her medication.  Does not for primary care doctor to refill.  In the ER, CBG elevated in the 500s.  I discussed these results with patient.  I offered further labs and treatment, patient declined.  She states she will pick up the medication and treat herself.  I discussed strict return precautions including any concerning symptoms for DKA.  Social work consult placed to assist with PCP apt.  At this time, patient appears safe for discharge.  Return precautions given.  Patient states she understands and agrees to plan  Final Clinical Impression(s) / ED Diagnoses Final diagnoses:  Hyperglycemia  Medication refill    Rx / DC Orders ED Discharge Orders          Ordered    LANTUS SOLOSTAR 100 UNIT/ML Solostar Pen  Daily at bedtime        11/24/20 1836    NOVOLOG FLEXPEN 100 UNIT/ML FlexPen  3 times daily before meals        11/24/20 1836             Franchot Heidelberg, PA-C 11/24/20 1915    Daleen Bo, MD 11/24/20 2347

## 2021-01-25 ENCOUNTER — Encounter (HOSPITAL_COMMUNITY): Payer: Self-pay | Admitting: Emergency Medicine

## 2021-01-25 ENCOUNTER — Other Ambulatory Visit: Payer: Self-pay

## 2021-01-25 ENCOUNTER — Emergency Department (HOSPITAL_COMMUNITY)
Admission: EM | Admit: 2021-01-25 | Discharge: 2021-01-25 | Disposition: A | Discharge status: Home / Self Care | Payer: Federal, State, Local not specified - PPO | Attending: Emergency Medicine | Admitting: Emergency Medicine

## 2021-01-25 DIAGNOSIS — R739 Hyperglycemia, unspecified: Secondary | ICD-10-CM | POA: Diagnosis not present

## 2021-01-25 DIAGNOSIS — E1010 Type 1 diabetes mellitus with ketoacidosis without coma: Secondary | ICD-10-CM | POA: Diagnosis not present

## 2021-01-25 DIAGNOSIS — Z760 Encounter for issue of repeat prescription: Secondary | ICD-10-CM | POA: Diagnosis not present

## 2021-01-25 DIAGNOSIS — E1165 Type 2 diabetes mellitus with hyperglycemia: Secondary | ICD-10-CM | POA: Diagnosis not present

## 2021-01-25 DIAGNOSIS — Z794 Long term (current) use of insulin: Secondary | ICD-10-CM | POA: Insufficient documentation

## 2021-01-25 DIAGNOSIS — E1065 Type 1 diabetes mellitus with hyperglycemia: Secondary | ICD-10-CM | POA: Diagnosis not present

## 2021-01-25 LAB — BASIC METABOLIC PANEL
Anion gap: 9 (ref 5–15)
BUN: 7 mg/dL (ref 6–20)
CO2: 24 mmol/L (ref 22–32)
Calcium: 9.1 mg/dL (ref 8.9–10.3)
Chloride: 102 mmol/L (ref 98–111)
Creatinine, Ser: 0.78 mg/dL (ref 0.44–1.00)
GFR, Estimated: >60 mL/min (ref >60)
Glucose, Bld: 301 mg/dL — ABNORMAL HIGH (ref 70–99)
Potassium: 4.2 mmol/L (ref 3.5–5.1)
Sodium: 135 mmol/L (ref 135–145)

## 2021-01-25 LAB — CBC WITH DIFFERENTIAL/PLATELET
Abs Immature Granulocytes: 0.01 10*3/uL (ref 0.00–0.07)
Basophils Absolute: 0 10*3/uL (ref 0.0–0.1)
Basophils Relative: 0 %
Eosinophils Absolute: 0.3 10*3/uL (ref 0.0–0.5)
Eosinophils Relative: 6 %
HCT: 40.9 % (ref 36.0–46.0)
Hemoglobin: 13.9 g/dL (ref 12.0–15.0)
Immature Granulocytes: 0 %
Lymphocytes Relative: 52 %
Lymphs Abs: 2.5 10*3/uL (ref 0.7–4.0)
MCH: 32.1 pg (ref 26.0–34.0)
MCHC: 34 g/dL (ref 30.0–36.0)
MCV: 94.5 fL (ref 80.0–100.0)
Monocytes Absolute: 0.4 10*3/uL (ref 0.1–1.0)
Monocytes Relative: 7 %
Neutro Abs: 1.7 10*3/uL (ref 1.7–7.7)
Neutrophils Relative %: 35 %
Platelets: 386 10*3/uL (ref 150–400)
RBC: 4.33 MIL/uL (ref 3.87–5.11)
RDW: 12 % (ref 11.5–15.5)
WBC: 4.8 10*3/uL (ref 4.0–10.5)
nRBC: 0 % (ref 0.0–0.2)

## 2021-01-25 MED ORDER — BLOOD GLUCOSE METER KIT: PACK | 0 refills | Status: DC

## 2021-01-25 MED ORDER — LANTUS SOLOSTAR 100 UNIT/ML ~~LOC~~ SOPN: 23.0000 [IU] | PEN_INJECTOR | Freq: Every day | SUBCUTANEOUS | 0 refills | Status: DC

## 2021-01-25 MED ORDER — NOVOLOG FLEXPEN 100 UNIT/ML ~~LOC~~ SOPN: 12.0000 [IU] | PEN_INJECTOR | Freq: Three times a day (TID) | SUBCUTANEOUS | 0 refills | Status: DC

## 2021-01-25 NOTE — ED Triage Notes (Signed)
Patient here requesting refill of novolog and lantus. Patient has no complaints. Patient is alert, oriented, ambulatory, and in no apparent distress at this time.

## 2021-01-25 NOTE — ED Provider Notes (Signed)
[facility] EMERGENCY DEPARTMENT Provider Note   CSN: [id number] Arrival date & time: 01/25/21  0732     History Chief Complaint  Patient presents with   Medication Refill    Judy Cook is a 20 y.o. female.  Pt presents to the ED today for a refill of her insulin.  She has no pcp and ran out of her insulin last night.  She denies any n/v or other complaints.      Past Medical History:  Diagnosis Date   Deliberate self-cutting    Diabetes mellitus ([place])     Patient Active Problem List   Diagnosis Date Noted   Intractable nausea and vomiting 12/06/2019   DKA (diabetic ketoacidoses) 12/03/2019   SIRS (systemic inflammatory response syndrome) ([place]) 12/03/2019   Hyperkalemia 12/03/2019   Hyperglycemia    Intractable vomiting    Dysmenorrhea 02/20/2019   Marijuana use 27/09/2374   Cyclical vomiting 28/31/5176   Hypokalemia 02/20/2019   Hyperglycemia due to type 1 diabetes mellitus ([place]) 02/19/2019   Type 1 diabetes mellitus without complication ([place]) 16/10/3708    No past surgical history on file.   OB History   No obstetric history on file.     Family History  Problem Relation Age of Onset   Diabetes Paternal Grandfather     Social History   Tobacco Use   Smoking status: Never   Smokeless tobacco: Never  Vaping Use   Vaping Use: Never used  Substance Use Topics   Alcohol use: Never   Drug use: Never    Home Medications Prior to Admission medications   Medication Sig Start Date End Date Taking? Authorizing Provider  blood glucose meter kit and supplies Dispense based on patient and insurance preference. Use up to four times daily as directed. (FOR ICD-10 E10.9, E11.9). 01/25/21   Isla Pence, MD  LANTUS SOLOSTAR 100 UNIT/ML Solostar Pen Inject 23 Units into the skin at bedtime. 01/25/21   Isla Pence, MD  NOVOLOG FLEXPEN 100 UNIT/ML FlexPen Inject 12 Units into the skin 3 (three) times daily before meals.  Ratio/sliding scale ( Adjust carb) 01/25/21   Isla Pence, MD  ondansetron (ZOFRAN ODT) 4 MG disintegrating tablet Take 1 tablet (4 mg total) by mouth every 8 (eight) hours as needed for nausea or vomiting. 09/30/20   Truddie Hidden, MD  pantoprazole (PROTONIX) 40 MG tablet Take 1 tablet (40 mg total) by mouth daily. 12/07/19 01/06/20  Antonieta Pert, MD  potassium chloride 20 MEQ TBCR Take 20 mEq by mouth daily for 7 doses. 12/07/19 12/14/19  Antonieta Pert, MD    Allergies    Patient has no known allergies.  Review of Systems   Review of Systems  All other systems reviewed and are negative.  Physical Exam Updated Vital Signs BP 125/87 (BP Location: Right Arm)   Pulse 85   Temp 98.5 F (36.9 C) (Oral)   Resp 16   SpO2 100%   Physical Exam Vitals and nursing note reviewed.  Constitutional:      Appearance: Normal appearance.  HENT:     Head: Normocephalic and atraumatic.     Right Ear: External ear normal.     Left Ear: External ear normal.     Nose: Nose normal.     Mouth/Throat:     Mouth: Mucous membranes are moist.     Pharynx: Oropharynx is clear.  Eyes:     Extraocular Movements: Extraocular movements intact.     Conjunctiva/sclera: Conjunctivae normal.  Pupils: Pupils are equal, round, and reactive to light.  Cardiovascular:     Rate and Rhythm: Normal rate and regular rhythm.     Pulses: Normal pulses.     Heart sounds: Normal heart sounds.  Pulmonary:     Effort: Pulmonary effort is normal.     Breath sounds: Normal breath sounds.  Abdominal:     General: Abdomen is flat. Bowel sounds are normal.     Palpations: Abdomen is soft.  Musculoskeletal:        General: Normal range of motion.     Cervical back: Normal range of motion and neck supple.  Skin:    General: Skin is warm.     Capillary Refill: Capillary refill takes less than 2 seconds.  Neurological:     General: No focal deficit present.     Mental Status: She is alert and oriented to person, place,  and time.  Psychiatric:        Mood and Affect: Mood normal.        Behavior: Behavior normal.        Thought Content: Thought content normal.        Judgment: Judgment normal.    ED Results / Procedures / Treatments   Labs (all labs ordered are listed, but only abnormal results are displayed) Labs Reviewed  BASIC METABOLIC PANEL - Abnormal; Notable for the following components:      Result Value   Glucose, Bld 301 (*)    All other components within normal limits  CBC WITH DIFFERENTIAL/PLATELET  CBG MONITORING, ED    EKG None  Radiology No results found.  Procedures Procedures   Medications Ordered in ED Medications - No data to display  ED Course  I have reviewed the triage vital signs and the nursing notes.  Pertinent labs & imaging results that were available during my care of the patient were reviewed by me and considered in my medical decision making (see chart for details).    MDM Rules/Calculators/A&P                           Pt has been here several times for insulin refills.  She has no pcp.  I have consulted TOC to try to help pt establish with pcp.  BS is 301, but has no signs of DKA.  She wishes to get her insulin refills and treat that blood sugar at home.    Pt is stable for d/c.  Return if worse.  Final Clinical Impression(s) / ED Diagnoses Final diagnoses:  Medication refill  Hyperglycemia    Rx / DC Orders ED Discharge Orders          Ordered    LANTUS SOLOSTAR 100 UNIT/ML Solostar Pen  Daily at bedtime        01/25/21 0958    NOVOLOG FLEXPEN 100 UNIT/ML FlexPen  3 times daily before meals        01/25/21 0958    blood glucose meter kit and supplies        01/25/21 0958             Isla Pence, MD 01/25/21 1007

## 2021-01-25 NOTE — ED Provider Notes (Signed)
Emergency Medicine Provider Triage Evaluation Note  Judy Cook , a 20 y.o. female  was evaluated in triage.  Pt complains of medication refill.  Ran of her insulin around 8 PM last night.  Normally takes 30 units.  Day.  Reports CBG running around 200.  She has no other complaints.  Review of Systems  Positive: Medication refill Negative: Abdominal pain, nausea/vomiting, fever/chills or any additional concerns  Physical Exam  BP 125/87 (BP Location: Right Arm)   Pulse 85   Temp 98.5 F (36.9 C) (Oral)   Resp 16   SpO2 100%  Gen:   Awake, no distress   Resp:  Normal effort  MSK:   Moves extremities without difficulty  Other:    Medical Decision Making  Medically screening exam initiated at 7:58 AM.  Appropriate orders placed.  Judy Cook was informed that the remainder of the evaluation will be completed by another provider, this initial triage assessment does not replace that evaluation, and the importance of remaining in the ED until their evaluation is complete.  Note: Portions of this report may have been transcribed using voice recognition software. Every effort was made to ensure accuracy; however, inadvertent computerized transcription errors may still be present.    Bill Salinas, PA-C 01/25/21 0355    Wynetta Fines, MD 01/25/21 [phone]

## 2021-01-31 ENCOUNTER — Encounter: Payer: Federal, State, Local not specified - PPO | Admitting: Student

## 2021-04-13 ENCOUNTER — Other Ambulatory Visit: Payer: Self-pay

## 2021-04-13 ENCOUNTER — Encounter (HOSPITAL_COMMUNITY): Payer: Self-pay | Admitting: Emergency Medicine

## 2021-04-13 ENCOUNTER — Emergency Department (HOSPITAL_COMMUNITY)
Admission: EM | Admit: 2021-04-13 | Discharge: 2021-04-13 | Disposition: A | Discharge status: Home / Self Care | Payer: Federal, State, Local not specified - PPO | Attending: Emergency Medicine | Admitting: Emergency Medicine

## 2021-04-13 DIAGNOSIS — E1065 Type 1 diabetes mellitus with hyperglycemia: Secondary | ICD-10-CM | POA: Insufficient documentation

## 2021-04-13 DIAGNOSIS — Z760 Encounter for issue of repeat prescription: Secondary | ICD-10-CM | POA: Diagnosis not present

## 2021-04-13 MED ORDER — NOVOLOG FLEXPEN 100 UNIT/ML ~~LOC~~ SOPN
12.0000 [IU] | PEN_INJECTOR | Freq: Three times a day (TID) | SUBCUTANEOUS | 0 refills | Status: DC
Start: 2021-04-13 — End: 2021-06-02

## 2021-04-13 MED ORDER — LANTUS SOLOSTAR 100 UNIT/ML ~~LOC~~ SOPN
21.0000 [IU] | PEN_INJECTOR | Freq: Every day | SUBCUTANEOUS | 0 refills | Status: DC
Start: 2021-04-13 — End: 2021-06-02

## 2021-04-13 NOTE — ED Provider Notes (Signed)
[facility] EMERGENCY DEPARTMENT Provider Note   CSN: [id number] Arrival date & time: 04/13/21  1156     History Chief Complaint  Patient presents with   Medication Refill    Judy Cook is a 20 y.o. female.  Patient presents the emergency department today requesting refill of insulin.  She ran out of Lantus last night and is close to running out of NovoLog.  She takes 21 units of Lantus at bedtime and a sliding scale of NovoLog based on her oral intake.  She states that her blood sugar was 180 this morning which is good for her.  She denies any symptoms.  She is scheduled to establish care with PCP in February 2023.      Past Medical History:  Diagnosis Date   Deliberate self-cutting    Diabetes mellitus ([place])     Patient Active Problem List   Diagnosis Date Noted   Intractable nausea and vomiting 12/06/2019   DKA (diabetic ketoacidoses) 12/03/2019   SIRS (systemic inflammatory response syndrome) ([place]) 12/03/2019   Hyperkalemia 12/03/2019   Hyperglycemia    Intractable vomiting    Dysmenorrhea 02/20/2019   Marijuana use 68/03/7516   Cyclical vomiting 00/17/4944   Hypokalemia 02/20/2019   Hyperglycemia due to type 1 diabetes mellitus ([place]) 02/19/2019   Type 1 diabetes mellitus without complication ([place]) 96/75/9163    History reviewed. No pertinent surgical history.   OB History   No obstetric history on file.     Family History  Problem Relation Age of Onset   Diabetes Paternal Grandfather     Social History   Tobacco Use   Smoking status: Never   Smokeless tobacco: Never  Vaping Use   Vaping Use: Never used  Substance Use Topics   Alcohol use: Never   Drug use: Never    Home Medications Prior to Admission medications   Medication Sig Start Date End Date Taking? Authorizing Provider  blood glucose meter kit and supplies Dispense based on patient and insurance preference. Use up to four times daily as directed. (FOR  ICD-10 E10.9, E11.9). 01/25/21   Isla Pence, MD  LANTUS SOLOSTAR 100 UNIT/ML Solostar Pen Inject 21 Units into the skin at bedtime. 04/13/21   Carlisle Cater, PA-C  NOVOLOG FLEXPEN 100 UNIT/ML FlexPen Inject 12 Units into the skin 3 (three) times daily before meals. Ratio/sliding scale ( Adjust carb) 04/13/21   Carlisle Cater, PA-C  ondansetron (ZOFRAN ODT) 4 MG disintegrating tablet Take 1 tablet (4 mg total) by mouth every 8 (eight) hours as needed for nausea or vomiting. 09/30/20   Truddie Hidden, MD  pantoprazole (PROTONIX) 40 MG tablet Take 1 tablet (40 mg total) by mouth daily. 12/07/19 01/06/20  Antonieta Pert, MD  potassium chloride 20 MEQ TBCR Take 20 mEq by mouth daily for 7 doses. 12/07/19 12/14/19  Antonieta Pert, MD    Allergies    Patient has no known allergies.  Review of Systems   Review of Systems  Constitutional:  Negative for fever.  Respiratory:  Negative for chest tightness.   Gastrointestinal:  Negative for abdominal pain, nausea and vomiting.  Endocrine: Negative for polydipsia and polyuria.   Physical Exam Updated Vital Signs BP 119/78 (BP Location: Right Arm)    Pulse 74    Temp 98.6 F (37 C) (Oral)    Resp 16    SpO2 100%   Physical Exam Vitals and nursing note reviewed.  Constitutional:      Appearance: She is well-developed.  HENT:     Head: Normocephalic and atraumatic.  Eyes:     Conjunctiva/sclera: Conjunctivae normal.  Pulmonary:     Effort: No respiratory distress.  Musculoskeletal:     Cervical back: Normal range of motion and neck supple.  Skin:    General: Skin is warm and dry.  Neurological:     Mental Status: She is alert.    ED Results / Procedures / Treatments   Labs (all labs ordered are listed, but only abnormal results are displayed) Labs Reviewed - No data to display  EKG None  Radiology No results found.  Procedures Procedures   Medications Ordered in ED Medications - No data to display  ED Course  I have reviewed the  triage vital signs and the nursing notes.  Pertinent labs & imaging results that were available during my care of the patient were reviewed by me and considered in my medical decision making (see chart for details).  Patient seen and examined.  Insulin refills sent in.  Patient encouraged to follow-up with her doctor as soon as possible.  Vital signs reviewed and are as follows: BP 119/78 (BP Location: Right Arm)    Pulse 74    Temp 98.6 F (37 C) (Oral)    Resp 16    SpO2 100%      MDM Rules/Calculators/A&P                         Patient out of insulin, no symptoms.      Final Clinical Impression(s) / ED Diagnoses Final diagnoses:  Medication refill    Rx / DC Orders ED Discharge Orders          Ordered    LANTUS SOLOSTAR 100 UNIT/ML Solostar Pen  Daily at bedtime        04/13/21 1214    NOVOLOG FLEXPEN 100 UNIT/ML FlexPen  3 times daily before meals        04/13/21 1214             Carlisle Cater, PA-C 04/13/21 1217    Godfrey Pick, MD 04/13/21 2212

## 2021-04-13 NOTE — Discharge Instructions (Signed)
Please fill and take your insulin as prescribed.  Monitor your blood sugars and return with any complications.  Please follow-up with your doctor soon as possible as these medications will need adjusting and that is not something that we do here in the emergency department.

## 2021-04-13 NOTE — ED Triage Notes (Signed)
Pt here for insulin refill. States she ran out last night and to see her PCP in Feb. Denies any pain or symptoms.

## 2021-04-13 NOTE — ED Notes (Signed)
Patient verbalizes understanding of discharge instructions. Opportunity for questioning and answers were provided. Armband removed by staff, pt discharged from ED and ambulated to lobby to return home.   

## 2021-06-02 ENCOUNTER — Encounter: Payer: Self-pay | Admitting: Nurse Practitioner

## 2021-06-02 ENCOUNTER — Other Ambulatory Visit: Payer: Self-pay

## 2021-06-02 ENCOUNTER — Ambulatory Visit (INDEPENDENT_AMBULATORY_CARE_PROVIDER_SITE_OTHER): Payer: Federal, State, Local not specified - PPO | Admitting: Nurse Practitioner

## 2021-06-02 VITALS — BP 114/68 | HR 92 | Temp 98.3°F | Ht 60.0 in | Wt 127.2 lb

## 2021-06-02 DIAGNOSIS — Z1329 Encounter for screening for other suspected endocrine disorder: Secondary | ICD-10-CM

## 2021-06-02 DIAGNOSIS — L680 Hirsutism: Secondary | ICD-10-CM

## 2021-06-02 DIAGNOSIS — Z124 Encounter for screening for malignant neoplasm of cervix: Secondary | ICD-10-CM

## 2021-06-02 DIAGNOSIS — Z13220 Encounter for screening for lipoid disorders: Secondary | ICD-10-CM | POA: Diagnosis not present

## 2021-06-02 DIAGNOSIS — E1065 Type 1 diabetes mellitus with hyperglycemia: Secondary | ICD-10-CM

## 2021-06-02 DIAGNOSIS — N920 Excessive and frequent menstruation with regular cycle: Secondary | ICD-10-CM

## 2021-06-02 DIAGNOSIS — L709 Acne, unspecified: Secondary | ICD-10-CM

## 2021-06-02 LAB — COMPREHENSIVE METABOLIC PANEL
ALT: 11 U/L (ref 0–35)
AST: 11 U/L (ref 0–37)
Albumin: 4 g/dL (ref 3.5–5.2)
Alkaline Phosphatase: 69 U/L (ref 39–117)
BUN: 14 mg/dL (ref 6–23)
CO2: 28 mEq/L (ref 19–32)
Calcium: 9.7 mg/dL (ref 8.4–10.5)
Chloride: 97 mEq/L (ref 96–112)
Creatinine, Ser: 0.97 mg/dL (ref 0.40–1.20)
GFR: 83.1 mL/min (ref >60.00)
Glucose, Bld: 318 mg/dL — ABNORMAL HIGH (ref 70–99)
Potassium: 4.1 mEq/L (ref 3.5–5.1)
Sodium: 133 mEq/L — ABNORMAL LOW (ref 135–145)
Total Bilirubin: 0.4 mg/dL (ref 0.2–1.2)
Total Protein: 7 g/dL (ref 6.0–8.3)

## 2021-06-02 LAB — CBC WITH DIFFERENTIAL/PLATELET
Basophils Absolute: 0 10*3/uL (ref 0.0–0.1)
Basophils Relative: 0.4 % (ref 0.0–3.0)
Eosinophils Absolute: 0.3 10*3/uL (ref 0.0–0.7)
Eosinophils Relative: 5.3 % — ABNORMAL HIGH (ref 0.0–5.0)
HCT: 41.9 % (ref 36.0–46.0)
Hemoglobin: 13.9 g/dL (ref 12.0–15.0)
Lymphocytes Relative: 27.2 % (ref 12.0–46.0)
Lymphs Abs: 1.4 10*3/uL (ref 0.7–4.0)
MCHC: 33.2 g/dL (ref 30.0–36.0)
MCV: 94.6 fl (ref 78.0–100.0)
Monocytes Absolute: 0.4 10*3/uL (ref 0.1–1.0)
Monocytes Relative: 8.3 % (ref 3.0–12.0)
Neutro Abs: 3 10*3/uL (ref 1.4–7.7)
Neutrophils Relative %: 58.8 % (ref 43.0–77.0)
Platelets: 345 10*3/uL (ref 150.0–400.0)
RBC: 4.43 Mil/uL (ref 3.87–5.11)
RDW: 12.8 % (ref 11.5–15.5)
WBC: 5 10*3/uL (ref 4.0–10.5)

## 2021-06-02 LAB — MICROALBUMIN / CREATININE URINE RATIO
Creatinine,U: 38.6 mg/dL
Microalb Creat Ratio: 1.8 mg/g (ref 0.0–30.0)
Microalb, Ur: <0.7 mg/dL (ref 0.0–1.9)

## 2021-06-02 LAB — LIPID PANEL
Cholesterol: 165 mg/dL (ref 0–200)
HDL: 59.2 mg/dL (ref >39.00)
LDL Cholesterol: 88 mg/dL (ref 0–99)
NonHDL: 105.38
Total CHOL/HDL Ratio: 3
Triglycerides: 89 mg/dL (ref 0.0–149.0)
VLDL: 17.8 mg/dL (ref 0.0–40.0)

## 2021-06-02 LAB — TSH: TSH: 0.55 u[IU]/mL (ref 0.35–5.50)

## 2021-06-02 LAB — HEMOGLOBIN A1C: Hgb A1c MFr Bld: 10.7 % — ABNORMAL HIGH (ref 4.6–6.5)

## 2021-06-02 MED ORDER — ACCU-CHEK GUIDE VI STRP: ORAL_STRIP | 11 refills | Status: DC

## 2021-06-02 MED ORDER — NOVOLOG FLEXPEN 100 UNIT/ML ~~LOC~~ SOPN: 12.0000 [IU] | PEN_INJECTOR | Freq: Three times a day (TID) | SUBCUTANEOUS | 3 refills | Status: DC

## 2021-06-02 MED ORDER — LANTUS SOLOSTAR 100 UNIT/ML ~~LOC~~ SOPN: 23.0000 [IU] | PEN_INJECTOR | Freq: Every day | SUBCUTANEOUS | 1 refills | Status: DC

## 2021-06-02 NOTE — Progress Notes (Signed)
Subjective:  Patient ID: Judy Cook, female    DOB: [birth year]  Age: 21 y.o. MRN: [id number]  CC:  Chief Complaint  Patient presents with   Establish Care      HPI  This patient arrives today for the above.  She tells me she has a history of type 1 diabetes and has not been being followed by a primary care provider or endocrinologist the last couple of years.  She wants to establish care now because she would like to get control of her health again and work on becoming healthier.  She does report that she has not been taking very good care of her diabetes and tells me this is mostly due to what sounds like diabetic burnout.  She is on Lantus and NovoLog and tolerates medication well but reports fasting blood sugars are in the 200s.  She would be open to trying insulin pump and would like to try continuous glucose monitoring if possible.  She is also due for Pap smear and diabetic eye exam.  She reports that she is sexually active and is not using contraception currently.  She lets me know that she has some hirsutism with hair growing around her chin as well as acne on her back.  She is concerned she may have PCOS.  She reports regular menstrual cycles but she does have significant cramping with or without her menstrual cycle.  Past Medical History:  Diagnosis Date   Deliberate self-cutting    Diabetes mellitus ([place])       Family History  Problem Relation Age of Onset   Alcohol abuse Mother    Depression Mother    Drug abuse Mother    Alcohol abuse Father    Drug abuse Father    High Cholesterol Father    Diabetes Paternal Grandfather     Social History   Social History Narrative   Not on file   Social History   Tobacco Use   Smoking status: Never   Smokeless tobacco: Never  Substance Use Topics   Alcohol use: Yes     Current Meds  Medication Sig   Accu-Chek Softclix Lancets lancets SMARTSIG:Topical 1-4 Times Daily   blood glucose meter kit and  supplies Dispense based on patient and insurance preference. Use up to four times daily as directed. (FOR ICD-10 E10.9, E11.9).   ondansetron (ZOFRAN ODT) 4 MG disintegrating tablet Take 1 tablet (4 mg total) by mouth every 8 (eight) hours as needed for nausea or vomiting.   [DISCONTINUED] ACCU-CHEK GUIDE test strip USE UP TO 4 TIMES DAILY AS DIRECTED   [DISCONTINUED] LANTUS SOLOSTAR 100 UNIT/ML Solostar Pen Inject 21 Units into the skin at bedtime.   [DISCONTINUED] NOVOLOG FLEXPEN 100 UNIT/ML FlexPen Inject 12 Units into the skin 3 (three) times daily before meals. Ratio/sliding scale ( Adjust carb)    ROS:  Review of Systems  Constitutional:  Negative for fever.  Eyes:  Negative for blurred vision and double vision.  Respiratory:  Negative for shortness of breath.   Cardiovascular:  Negative for chest pain.  Gastrointestinal:  Negative for abdominal pain and blood in stool.  Neurological:  Negative for seizures and loss of consciousness.    Objective:   Today's Vitals: BP 114/68 (BP Location: Left Arm, Patient Position: Sitting, Cuff Size: Normal)    Pulse 92    Temp 98.3 F (36.8 C) (Oral)    Ht 5' (1.524 m)    Wt 127 lb 3.2 oz (57.7  kg)    LMP 05/14/2021 (Exact Date)    SpO2 100%    BMI 24.84 kg/m  Vitals with BMI 06/02/2021 04/13/2021 01/25/2021  Height 5' 0"  - -  Weight 127 lbs 3 oz - -  BMI 40.10 - -  Systolic [phone]  Diastolic 68 78 70  Pulse 92 74 66     Physical Exam Vitals reviewed.  Constitutional:      General: She is not in acute distress.    Appearance: Normal appearance.  HENT:     Head: Normocephalic and atraumatic.  Neck:     Vascular: No carotid bruit.  Cardiovascular:     Rate and Rhythm: Normal rate and regular rhythm.     Pulses: Normal pulses.     Heart sounds: Normal heart sounds.  Pulmonary:     Effort: Pulmonary effort is normal.     Breath sounds: Normal breath sounds.  Skin:    General: Skin is warm and dry.  Neurological:     General:  No focal deficit present.     Mental Status: She is alert and oriented to person, place, and time.  Psychiatric:        Mood and Affect: Mood normal.        Behavior: Behavior normal.        Judgment: Judgment normal.         Assessment and Plan   1. Type 1 diabetes mellitus with hyperglycemia (HCC)   2. Screening, lipid   3. Screening for thyroid disorder   4. Cervical cancer screening   5. Hirsutism   6. Acne, unspecified acne type   7. Menorrhagia with regular cycle      Plan: 1.  Will refer to endocrinology for assistance with managing her type 1 diabetes.  Because her fasting blood sugars are elevated we will increase her Lantus from 21 units per evening to 23 units per evening.  She was told if she starts to experience hypoglycemia that she should return back to her 21 units.  She reports her understanding.  We will also refer her to diabetes educator and nutritional services to help her with lifestyle changes aimed at controlling her type 1 diabetes. 2.,  3.  We will check blood work for further evaluation. 4.  Will refer to OB/GYN to discuss contraception as well as to complete cervical cancer screening. 5.-7.  Will order pelvic ultrasound to look for any cyst on her ovaries.   Tests ordered Orders Placed This Encounter  Procedures   US Pelvis Limited   TSH   Hemoglobin A1c   Lipid panel   Comprehensive metabolic panel   CBC with Differential/Platelet   Microalbumin / creatinine urine ratio   Ambulatory referral to Endocrinology   Referral to Nutrition and Diabetes Services   Ambulatory referral to Obstetrics / Gynecology   Ambulatory referral to Ophthalmology      Meds ordered this encounter  Medications   LANTUS SOLOSTAR 100 UNIT/ML Solostar Pen    Sig: Inject 23 Units into the skin at bedtime.    Dispense:  15 mL    Refill:  1    Order Specific Question:   Supervising Provider    Answer:   BURNS, STACY J [[id number]]   NOVOLOG FLEXPEN 100 UNIT/ML  FlexPen    Sig: Inject 12 Units into the skin 3 (three) times daily before meals. Ratio/sliding scale ( Adjust carb)    Dispense:  15 mL    Refill:  3  Order Specific Question:   Supervising Provider    Answer:   BURNS, Claudina Lick [[id number]]   ACCU-CHEK GUIDE test strip    Sig: Use to check your blood sugar 4 times a day (before meals and at bedtime)    Dispense:  100 each    Refill:  11    Order Specific Question:   Supervising Provider    Answer:   Binnie Rail [[id number]]    Patient to follow-up in 1 month for complete physical exam, or sooner as needed.  Ailene Ards, NP

## 2021-06-03 ENCOUNTER — Other Ambulatory Visit: Payer: Self-pay | Admitting: Nurse Practitioner

## 2021-06-03 DIAGNOSIS — L709 Acne, unspecified: Secondary | ICD-10-CM

## 2021-06-03 DIAGNOSIS — N920 Excessive and frequent menstruation with regular cycle: Secondary | ICD-10-CM

## 2021-06-03 DIAGNOSIS — L680 Hirsutism: Secondary | ICD-10-CM

## 2021-06-08 ENCOUNTER — Other Ambulatory Visit: Payer: Self-pay

## 2021-06-08 ENCOUNTER — Encounter: Payer: Federal, State, Local not specified - PPO | Attending: Nurse Practitioner | Admitting: Nutrition

## 2021-06-08 DIAGNOSIS — Z713 Dietary counseling and surveillance: Secondary | ICD-10-CM | POA: Diagnosis not present

## 2021-06-08 DIAGNOSIS — E1065 Type 1 diabetes mellitus with hyperglycemia: Secondary | ICD-10-CM | POA: Insufficient documentation

## 2021-06-09 NOTE — Patient Instructions (Addendum)
Take Humalog before each meal, or switch to Lyumjev insulin Stop all sweet drinks and syrups and switch to some other brand of diet drink for 2 weeks before trying diet Coke Limit eggs to 2 per meal, and use other kinds of protein like 2 T of peanut butter, or low fat cheeses that we discussed Limit high fat fried foods to one at each mea Take 2u of Humalog before all snacks eaten-consider using CeQur patch for this. Return in 2 weeks to review CGM Consider wearing CGM to see effects of foods and drinks on blood sugar readings.

## 2021-06-09 NOTE — Progress Notes (Signed)
Patient is here today to learn how to eat "better" and to gain weight.  Says has type I diabetes and has not been taking care of herself like she should and would like to do better. SBGM:  did not test blood sugar this AM,and could not tell me the name of her meter.   Insulin dose:  Lantus: 23u at 8PM.                           Humalog: 10u  takes this after eating, and                                                With all meals, or snacks                                               ("Sometimes I forget").  Does\                                              Not adjust dose for meal size Typical Day: 9AM: up, drinks water.  Usually dose a fingerstick reading:200-300s 9:30AM: bfast: 3 pieces of bacon, 2-3 eggs, 2 frozen                         waffles with syrup,   Or, mixed fruit                           1 cup with toast and jelly 12:30 Lunch: 6inch subway sandwich with chips and                        Regular soda-16 ounces 4PM: chips large chocolate bar, or other candy-no insulin 8PM: Fried fish with chips and slaw, or fried chicken with            Donzetta Sprung.  Will take 13u of Humalog after the meal due to            High blood sugar readings. 10PM: Candy bar, chips with soda.  No insulin Discussion:  Need to take Humalog ac meals due to fact that this insulin would begin to work for 20 min.  If this is a problem, can switch to Lyumjev insulin that begins to work in 3-5 minutes. Need to get blood sugars under control in order for her to gain weight of muscle and not fat.  Need to take 4 extra units of insulin to cover 16 ounce of soda.  This will put weight on that is only fat weight. Need for balanced meal of protein, carbs and small amount of fat to build muscle to add weight.  Must limit amounts of each of the above food groups so that blood sugars don't rise too high at any one time after the meal. Too much carbs (soda,fruit,starches blood sugar is high 1-2 hours after eating. Too much  protein, blood sugar is high 2-4 hours after eating. Too much fat, blood sugar is high 3-6 hours  after eating.  Need to test blood sugars to see if getting the right dose of insulin.  Pt. Says she does not like to test.  She was given a Freestyle Libre 3 to use for the next to weeks.  This was linked to her phone and started in her left outer arm.  I am hoping she can see the effects of sweet drinks on her blood sugars, and this may stop the frequency of these drinks-2-3 per day. Need to limits fried foods to no more than on fired thing in a meal.  Switch to baked potatoes with less butter, or take coating off some of the fish or chicken to reduce fat, or limit oil eggs are fired in, as well as amount of butter on toast and waffels.   Discussed meal sizes for each meal she is eating and reducing eggs to 2 in the morning-,and removing some of the bread from the 6 inch sub, when having fries, etc. Need to cover snack with Humalog.  Consider wearing CeQur which is a 3 day patch of humalog worn on your skin to deliver insulin when snacking

## 2021-06-11 ENCOUNTER — Encounter: Payer: Self-pay | Admitting: Nurse Practitioner

## 2021-06-13 ENCOUNTER — Telehealth: Payer: Self-pay

## 2021-06-13 MED ORDER — INSULIN DETEMIR 100 UNIT/ML ~~LOC~~ SOLN: 23.0000 [IU] | Freq: Every day | SUBCUTANEOUS | 5 refills | Status: DC

## 2021-06-13 NOTE — Telephone Encounter (Signed)
Judy Cook is out of the office and patient is requesting a switch in insulin medication as her insurance no longer covers Lantus. Patient is currently out of town with no insulin and would like an urgent refill sent to:  [id number] - [place], [place] - [address]

## 2021-06-13 NOTE — Telephone Encounter (Signed)
Pt is calling in wanting a refill on insulin. Insurance will no longer cover Lantus but will cover Levemir. .  Pt is out of Lantus.  Pharmacy: [id number] - [place], [place] - [address]  Pt CB [phone]

## 2021-06-13 NOTE — Telephone Encounter (Signed)
Rx request was sent to Dr.Burns since Maralyn Sago is out of office. New rx was sent to pharmacy per patient request.

## 2021-06-15 IMAGING — CT CT ABD-PELV W/ CM
2 of 4 series · 16 of 46 positions shown, 18 images · IV contrast (APPLIED)
Comparison: None.

CLINICAL DATA: Nausea vomiting with left upper quadrant abdominal
pain.

EXAM:
CT ABDOMEN AND PELVIS WITH CONTRAST
TECHNIQUE: Multidetector CT imaging of the abdomen and pelvis was performed
using the standard protocol following bolus administration of
intravenous contrast.
CONTRAST:  100mL OMNIPAQUE IOHEXOL 300 MG/ML  SOLN

[Series 2: axial st · axial · 0.71mm/px · z∈[-368,-3]mm · 13 of 83 slices shown, 15 images]
[im 5/83  soft-tissue]
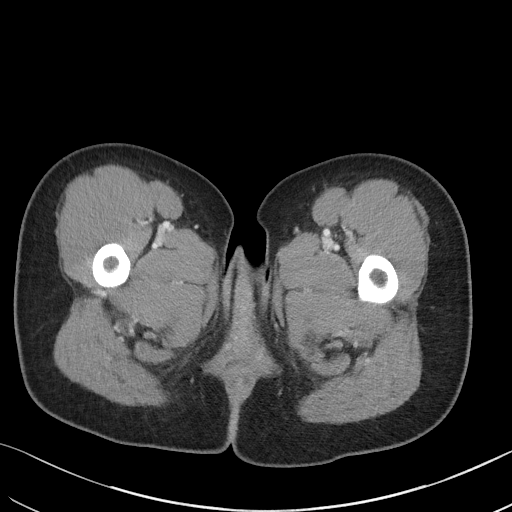
[im 5/83  bone]
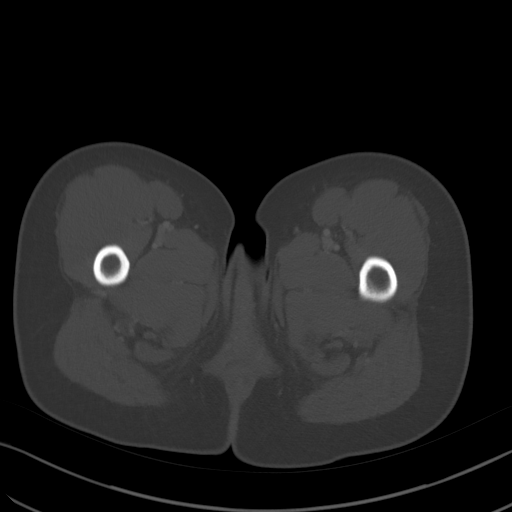
[im 10/83  soft-tissue]
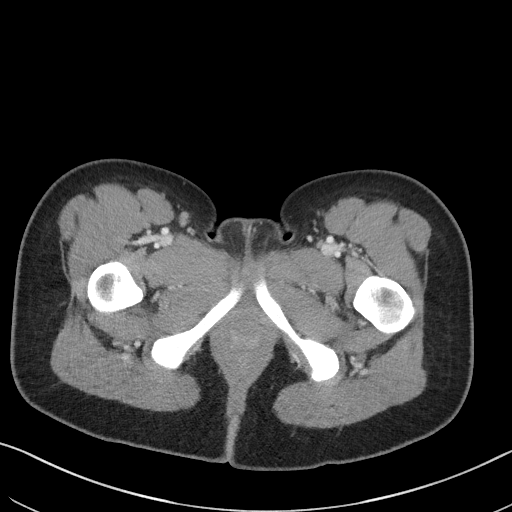
[im 20/83  soft-tissue]
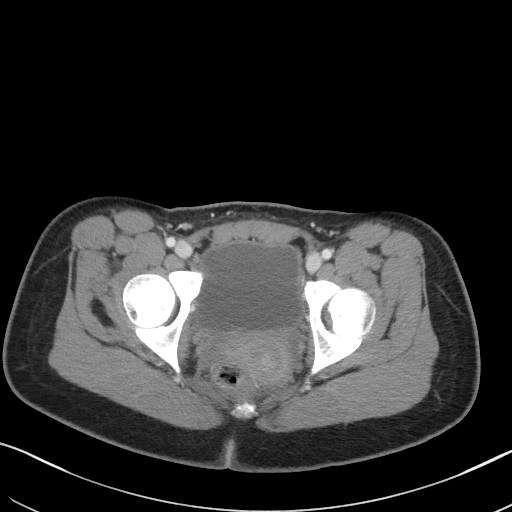
[im 25/83  soft-tissue]
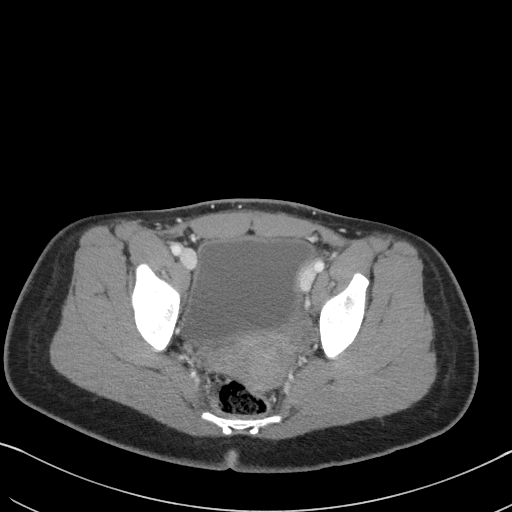
[im 29/83  soft-tissue]
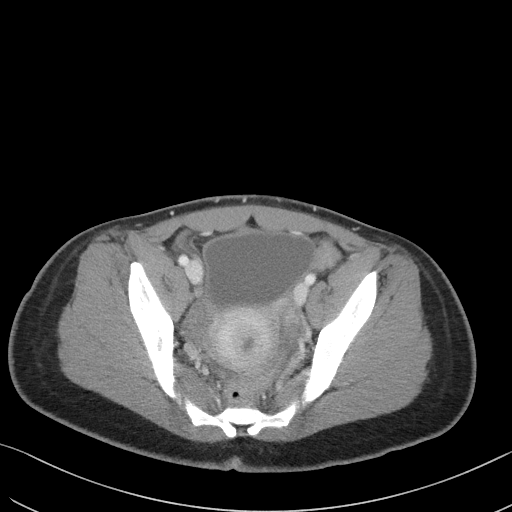
[im 34/83  soft-tissue]
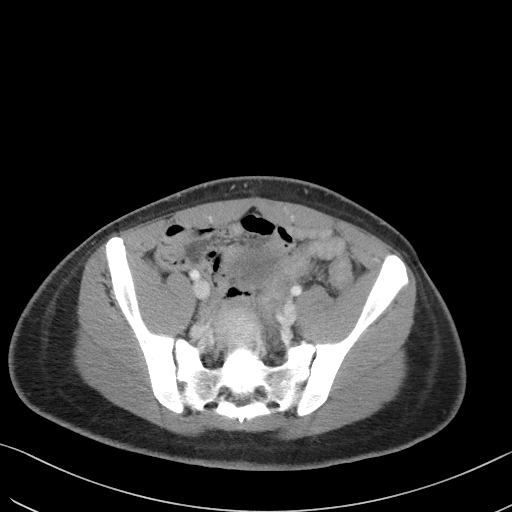
[im 44/83  soft-tissue]
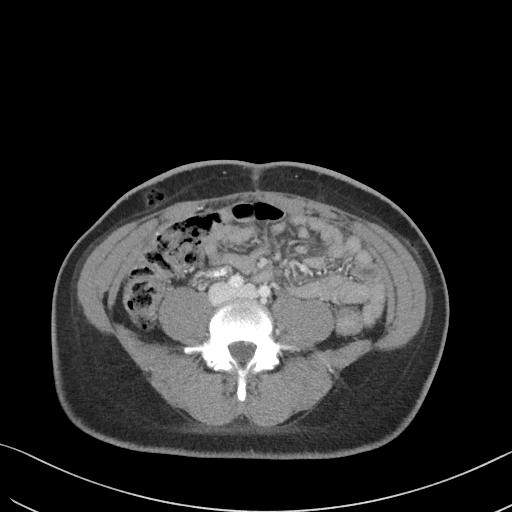
[im 49/83  soft-tissue]
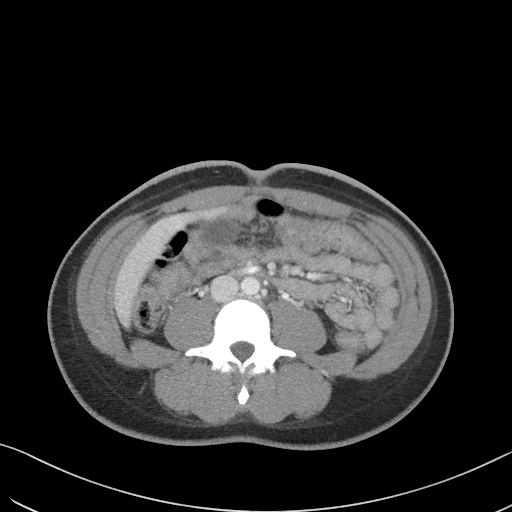
[im 54/83  soft-tissue]
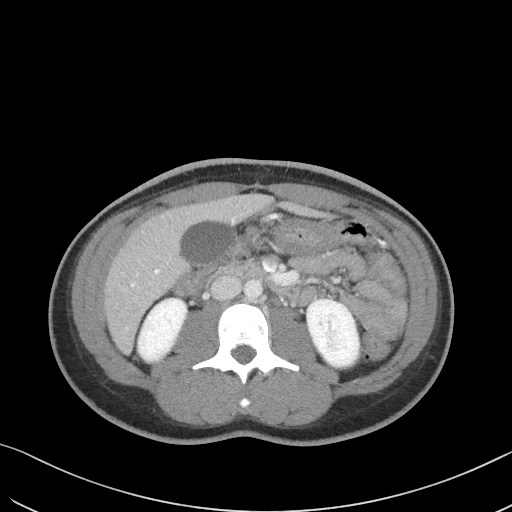
[im 54/83  bone]
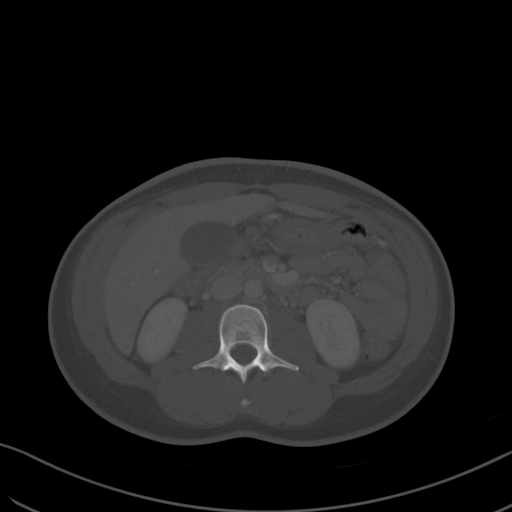
[im 58/83  soft-tissue]
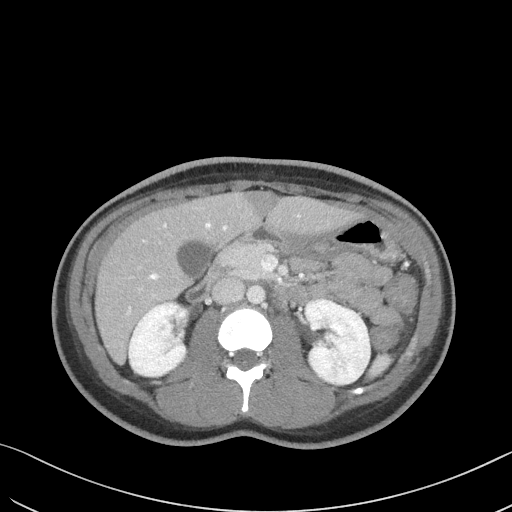
[im 63/83  soft-tissue]
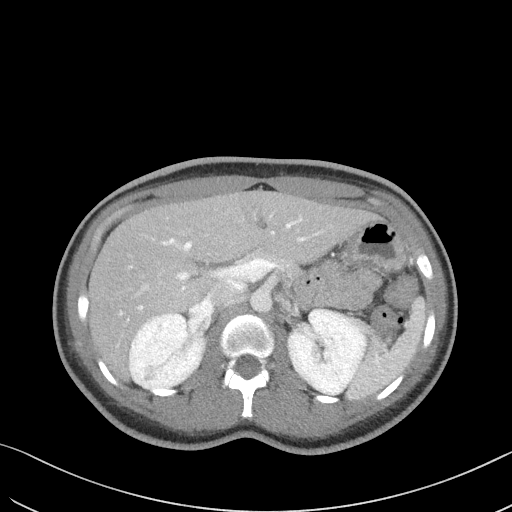
[im 73/83  soft-tissue]
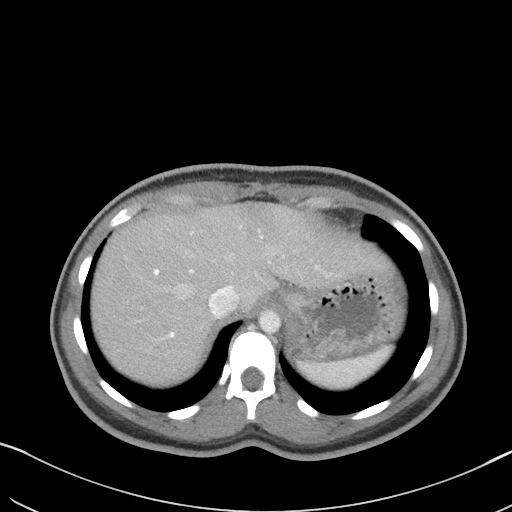
[im 78/83  soft-tissue]
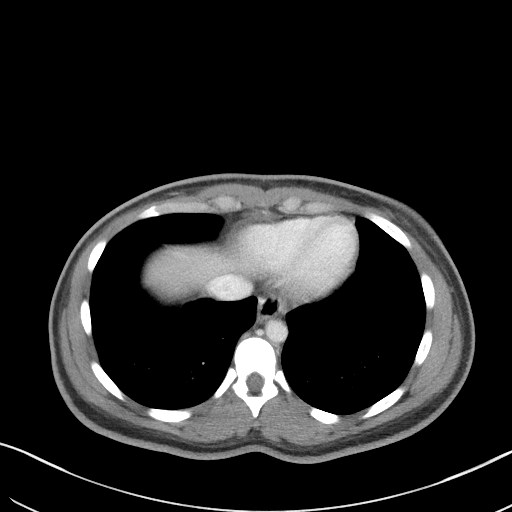

[Series 4: coronal st · coronal · 0.68mm/px · 3 of 71 slices shown]
[im 24/71  soft-tissue]
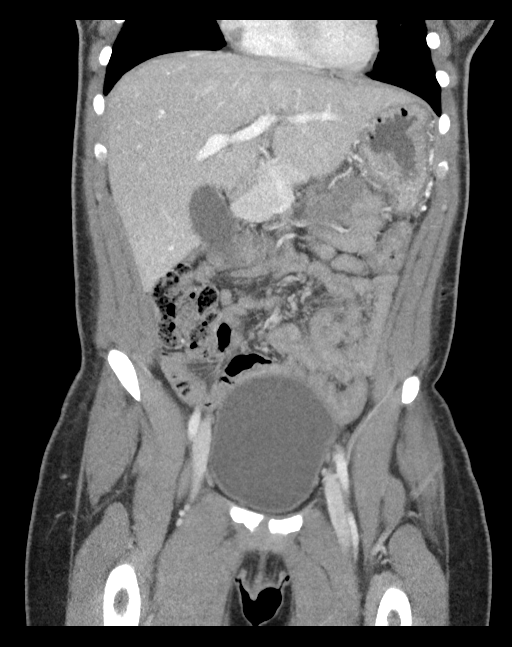
[im 32/71  soft-tissue]
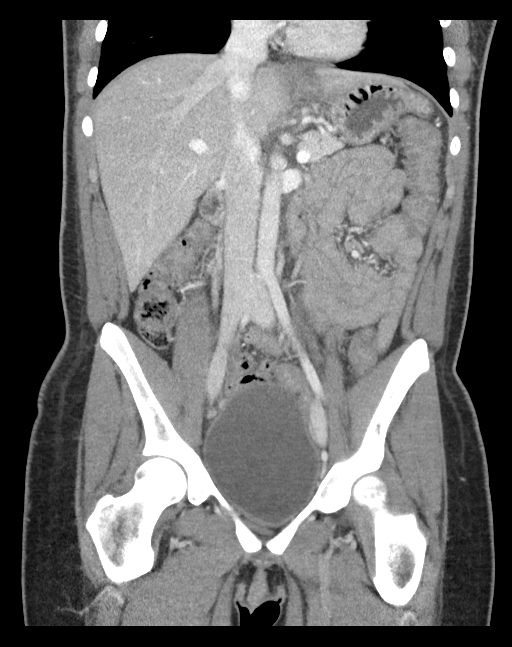
[im 39/71  soft-tissue]
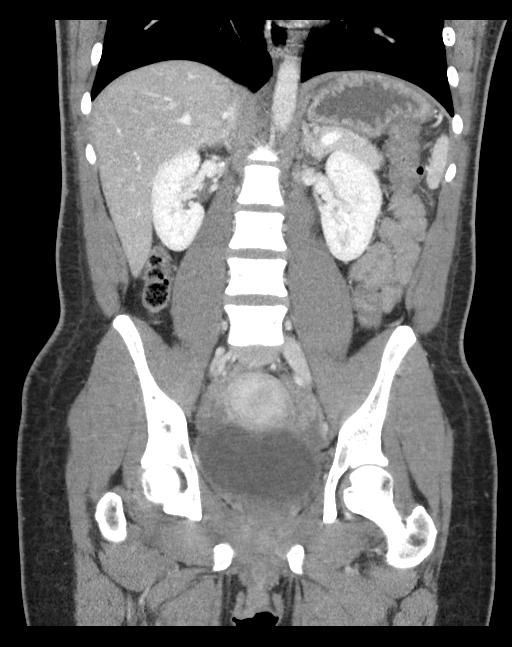

[16 of 46 positions shown; findings below may reference images not displayed]

FINDINGS: Lower chest: No acute abnormality.

Hepatobiliary: Hypoattenuation along the falciform ligament likely
represents focal fat. No gallstones, gallbladder wall thickening,
or biliary dilatation.

Pancreas: Unremarkable. No pancreatic ductal dilatation or
surrounding inflammatory changes.

Spleen: Normal in size without focal abnormality.

Adrenals/Urinary Tract: Adrenal glands are unremarkable. Kidneys are
normal, without renal calculi, focal lesion, or hydronephrosis.
Bladder is unremarkable.

Stomach/Bowel: Stomach is within normal limits. Appendix appears
normal. There appears to be mild wall thickening of the large bowel
from the hepatic flexure to the proximal descending colon, however
the segment of bowel is not well distended and evaluation of wall
thickening is limited. No significant associated fat stranding. No
evidence of bowel obstruction.

Vascular/Lymphatic: No significant vascular findings are present. No
enlarged abdominal or pelvic lymph nodes.

Reproductive: Uterus and bilateral adnexa are unremarkable.

Other: No abdominal wall hernia or abnormality. No abdominopelvic
ascites. Gas in the subcutaneous fat of the anterior abdominal wall
is likely related to medication injections.

Musculoskeletal: No acute or significant osseous findings.
IMPRESSION: Apparent mild wall thickening of the large bowel from the hepatic
flexure to the proximal descending colon may represent an infectious
or inflammatory colitis.

## 2021-06-24 ENCOUNTER — Other Ambulatory Visit: Payer: Self-pay

## 2021-06-24 ENCOUNTER — Ambulatory Visit (INDEPENDENT_AMBULATORY_CARE_PROVIDER_SITE_OTHER): Payer: Federal, State, Local not specified - PPO | Admitting: Medical

## 2021-06-24 ENCOUNTER — Other Ambulatory Visit (HOSPITAL_COMMUNITY)
Admission: RE | Admit: 2021-06-24 | Discharge: 2021-06-24 | Disposition: A | Discharge status: Home / Self Care | Payer: Federal, State, Local not specified - PPO | Source: Ambulatory Visit | Attending: Medical | Admitting: Medical

## 2021-06-24 ENCOUNTER — Encounter (HOSPITAL_BASED_OUTPATIENT_CLINIC_OR_DEPARTMENT_OTHER): Payer: Self-pay | Admitting: Medical

## 2021-06-24 VITALS — BP 120/77 | HR 72 | Ht 61.25 in | Wt 121.0 lb

## 2021-06-24 DIAGNOSIS — Z3009 Encounter for other general counseling and advice on contraception: Secondary | ICD-10-CM | POA: Diagnosis not present

## 2021-06-24 DIAGNOSIS — Z8619 Personal history of other infectious and parasitic diseases: Secondary | ICD-10-CM | POA: Diagnosis not present

## 2021-06-24 DIAGNOSIS — Z3202 Encounter for pregnancy test, result negative: Secondary | ICD-10-CM

## 2021-06-24 DIAGNOSIS — Z7689 Persons encountering health services in other specified circumstances: Secondary | ICD-10-CM

## 2021-06-24 DIAGNOSIS — Z01419 Encounter for gynecological examination (general) (routine) without abnormal findings: Secondary | ICD-10-CM | POA: Insufficient documentation

## 2021-06-24 HISTORY — DX: Personal history of other infectious and parasitic diseases: Z86.19

## 2021-06-24 LAB — POCT URINE PREGNANCY: Preg Test, Ur: NEGATIVE

## 2021-06-24 MED ORDER — NORGESTIMATE-ETH ESTRADIOL 0.25-35 MG-MCG PO TABS: 1.0000 | ORAL_TABLET | Freq: Every day | ORAL | 11 refills | Status: DC

## 2021-06-24 NOTE — Progress Notes (Signed)
? ?History:  ?Ms. Kaleeya Hancock is a 21 y.o. G0P0000 who presents to clinic today for annual exam and to establish care. The patient has never had a pap smear. She has a history of HSV. She is sexually active with 3-5 female partners currently. She uses condoms for birth control. Periods are regular, lasting 3-5 days with mild cramping. She denies bleeding, discharge, UTI symptoms or breast concerns today. She states intermittent constipation. She also states midline pelvic cramping rated at 5/10 today. [place] for evaluation was previously scheduled by her PCP and will be next week. She is interested in discussing birth control today.  ? ?The following portions of the patient's history were reviewed and updated as appropriate: allergies, current medications, family history, past medical history, social history, past surgical history and problem list. ? ?Review of Systems:  ?Review of Systems  ?Constitutional:  Negative for fever and malaise/fatigue.  ?Gastrointestinal:  Positive for abdominal pain. Negative for constipation, diarrhea, nausea and vomiting.  ?Genitourinary:  Negative for dysuria, frequency and urgency.  ?     Neg - vaginal bleeding, discharge  ? ?  ?Objective:  ?Physical Exam ?BP 120/77   Pulse 72   Ht 5' 1.25" (1.556 m)   Wt 121 lb (54.9 kg)   BMI 22.68 kg/m?  ?Physical Exam ?Vitals and nursing note reviewed. Exam conducted with a chaperone present.  ?Constitutional:   ?   General: She is not in acute distress. ?   Appearance: She is well-developed and normal weight.  ?HENT:  ?   Head: Normocephalic and atraumatic.  ?Neck:  ?   Thyroid: No thyromegaly.  ?Cardiovascular:  ?   Rate and Rhythm: Normal rate and regular rhythm.  ?   Heart sounds: No murmur heard. ?Pulmonary:  ?   Effort: Pulmonary effort is normal. No respiratory distress.  ?   Breath sounds: Normal breath sounds. No wheezing.  ?Abdominal:  ?   General: Abdomen is flat. Bowel sounds are normal. There is no distension.  ?    Palpations: Abdomen is soft. There is no mass.  ?   Tenderness: There is abdominal tenderness (midline lower). There is no guarding or rebound.  ?Genitourinary: ?   General: Normal vulva.  ?   Vagina: No vaginal discharge or bleeding.  ?   Cervix: No cervical motion tenderness, discharge or friability.  ?   Uterus: Tender (mild). Not enlarged.   ?   Adnexa:     ?   Right: No mass or tenderness.      ?   Left: No mass or tenderness.    ?Musculoskeletal:  ?   Cervical back: Neck supple.  ?Skin: ?   General: Skin is warm and dry.  ?   Findings: No erythema.  ?Neurological:  ?   Mental Status: She is alert and oriented to person, place, and time.  ?Psychiatric:     ?   Mood and Affect: Mood normal.  ? ? ? ?Labs and Imaging ?Results for orders placed or performed in visit on 06/24/21 (from the past 24 hour(s))  ?POCT urine pregnancy     Status: None  ? Collection Time: 06/24/21 10:27 AM  ?Result Value Ref Range  ? Preg Test, Ur Negative Negative  ? ? ?Health Maintenance Due  ?Topic Date Due  ? FOOT EXAM  Never done  ? OPHTHALMOLOGY EXAM  Never done  ? HPV VACCINES (1 - 2-dose series) Never done  ? Hepatitis C Screening  Never done  ?  TETANUS/TDAP  Never done  ? PAP-Cervical Cytology Screening  Never done  ? PAP SMEAR-Modifier  Never done  ? ? ? ?Assessment & Plan:  ?1. Encounter to establish care ? ?2. Women's annual routine gynecological examination ?- Cytology - PAP( [facility]) ?- Hepatitis C antibody ?- Hepatitis B surface antigen ?- HIV Antibody (routine testing w rflx) ?- RPR ? ?3. Birth control counseling ?- Discussed all options for birth control including OCPs, Injection, Implant, Ring, Patch, IUD. Patient would like to try OCPs ?- Discussed importance of taking pills at the same time everyday and condom use for STD protection and back-up method for at least the first month  ? ?4. History of herpes genitalis ?- No current outbreak, patient can call when refills are needed  ? ?5. Unwanted fertility ?-  norgestimate-ethinyl estradiol (ORTHO-CYCLEN) 0.25-35 MG-MCG tablet; Take 1 tablet by mouth daily.  Dispense: 28 tablet; Refill: 11 ? ?Approximately 20 minutes of total time was spent with this patient on chart review of recent visits and primary care, history taking, patient education on birth control and symptoms, coordination of care, physical exam including pap smear and documentation. ? ?Marny Lowenstein, PA-C ?06/24/2021 ?11:03 AM ? ?

## 2021-06-27 ENCOUNTER — Other Ambulatory Visit: Payer: Federal, State, Local not specified - PPO

## 2021-06-27 LAB — CYTOLOGY - PAP
Chlamydia: NEGATIVE
Comment: NEGATIVE
Comment: NORMAL
Diagnosis: NEGATIVE
Neisseria Gonorrhea: NEGATIVE

## 2021-07-02 LAB — HEPATITIS B SURFACE ANTIGEN: Hepatitis B Surface Ag: NEGATIVE

## 2021-07-02 LAB — HIV ANTIBODY (ROUTINE TESTING W REFLEX): HIV Screen 4th Generation wRfx: NONREACTIVE

## 2021-07-02 LAB — HEPATITIS C ANTIBODY: Hep C Virus Ab: NONREACTIVE

## 2021-07-02 LAB — RPR: RPR Ser Ql: NONREACTIVE

## 2021-07-05 ENCOUNTER — Other Ambulatory Visit: Payer: Federal, State, Local not specified - PPO

## 2021-07-07 ENCOUNTER — Other Ambulatory Visit: Payer: Self-pay

## 2021-07-07 ENCOUNTER — Ambulatory Visit (INDEPENDENT_AMBULATORY_CARE_PROVIDER_SITE_OTHER): Payer: Federal, State, Local not specified - PPO | Admitting: Nurse Practitioner

## 2021-07-07 VITALS — BP 124/72 | HR 90 | Temp 98.3°F | Ht 60.0 in | Wt 129.2 lb

## 2021-07-07 DIAGNOSIS — Z0001 Encounter for general adult medical examination with abnormal findings: Secondary | ICD-10-CM | POA: Diagnosis not present

## 2021-07-07 DIAGNOSIS — K5900 Constipation, unspecified: Secondary | ICD-10-CM

## 2021-07-07 DIAGNOSIS — J309 Allergic rhinitis, unspecified: Secondary | ICD-10-CM | POA: Diagnosis not present

## 2021-07-07 DIAGNOSIS — E1065 Type 1 diabetes mellitus with hyperglycemia: Secondary | ICD-10-CM

## 2021-07-07 HISTORY — DX: Allergic rhinitis, unspecified: J30.9

## 2021-07-07 MED ORDER — FLUTICASONE PROPIONATE 50 MCG/ACT NA SUSP: 2.0000 | Freq: Every day | NASAL | 6 refills | Status: DC

## 2021-07-07 NOTE — Assessment & Plan Note (Signed)
Chronic.  Encouraged her to consider using over-the-counter stool softener as needed for constipation. ?

## 2021-07-07 NOTE — Assessment & Plan Note (Signed)
Appears to be up-to-date with routine health maintenance recommended for female of her age.  She will be due for annual physical exam again next year. ?

## 2021-07-07 NOTE — Patient Instructions (Addendum)
Docusate Sodium - take as instructed on package as needed for constipation ? ?Take a claritin OR zyrtec every morning to help with your nasal congestion. If symptoms persist or worsen over the next 2 weeks or so feel free to reach back out to [place].  ?

## 2021-07-07 NOTE — Assessment & Plan Note (Signed)
Acute, I think most likely symptoms of nasal congestion related to allergic rhinitis.  For now recommend symptom management with Flonase nasal spray as well as over-the-counter antihistamine.  She was encouraged to call the office if symptoms persist or worsen over the next 2 weeks at which point may consider antibiotic.  She reports her understanding. ?

## 2021-07-07 NOTE — Assessment & Plan Note (Signed)
Chronic.  She was encouraged to follow-up with endocrinology as scheduled.  Blood sugars have improved slightly with the reintroduction of her insulin.  We did discuss going up on her Levemir, but for now she would like to hold off until she speaks to endocrinology.  Diabetic foot exam completed today.  We did discuss possibly starting statin therapy for ASCVD risk reduction, however she is of childbearing age so I will defer this decision to endocrinology.  She reports her understanding. ?

## 2021-07-07 NOTE — Progress Notes (Signed)
? ? ? ?Subjective:  ?Patient ID: Judy Cook, female    DOB: Nov 17, 2000  Age: 21 y.o. MRN: [id number] ? ?CC:  ?Chief Complaint  ?Patient presents with  ? Annual Exam  ? Nasal Congestion  ?  Nasal Congestion and pressure x 3 days   ?  ? ? ?HPI  ?This patient arrives today for the above. ? ?Blood work was collected at her last office visit and she is here to discuss this today.  She does have type 1 diabetes and A1c was greater than 10, she has been referred to endocrinology and is scheduled to see them early next month.  We also restarted her on her insulin and she tells me that her blood sugars have improved.  She was seeing blood sugars in the 300s but has now come down to the 200s.  She has also seen dietitian.  ? ?As far as health maintenance is concerned she is had Pap smear completed this year.  She is due for tetanus shot, but tells me she would not like to have this administered today.  She tells me she is already had the HPV vaccines administered. ? ?She does report some nasal congestion that started about 3 days ago.  She has been taking Aleve with some improvement in her symptoms. ? ?She also reports some mild constipation.  She tells me she does have a daily bowel movement will sometimes have to strain to have a bowel movement. ? ?Past Medical History:  ?Diagnosis Date  ? Diabetes mellitus ([place])   ? ? ? ? ?Family History  ?Problem Relation Age of Onset  ? Diabetes Paternal Grandfather   ? Heart attack Paternal Grandfather   ? Diabetes Paternal Grandmother   ? Cancer Maternal Grandmother   ?     lung  ? Alcohol abuse Maternal Grandfather   ? Alcohol abuse Father   ? Drug abuse Father   ? High Cholesterol Father   ? Alcohol abuse Mother   ? Depression Mother   ? Drug abuse Mother   ? ? ?Social History  ? ?Social History Narrative  ? Not on file  ? ?Social History  ? ?Tobacco Use  ? Smoking status: Never  ? Smokeless tobacco: Never  ?Substance Use Topics  ? Alcohol use: Yes  ?  Comment: social   ? ? ? ?Current Meds  ?Medication Sig  ? ACCU-CHEK GUIDE test strip Use to check your blood sugar 4 times a day (before meals and at bedtime)  ? Accu-Chek Softclix Lancets lancets SMARTSIG:Topical 1-4 Times Daily  ? blood glucose meter kit and supplies Dispense based on patient and insurance preference. Use up to four times daily as directed. (FOR ICD-10 E10.9, E11.9).  ? fluticasone (FLONASE) 50 MCG/ACT nasal spray Place 2 sprays into both nostrils daily.  ? insulin detemir (LEVEMIR) 100 UNIT/ML injection Inject 0.23 mLs (23 Units total) into the skin at bedtime.  ? norgestimate-ethinyl estradiol (ORTHO-CYCLEN) 0.25-35 MG-MCG tablet Take 1 tablet by mouth daily.  ? NOVOLOG FLEXPEN 100 UNIT/ML FlexPen Inject 12 Units into the skin 3 (three) times daily before meals. Ratio/sliding scale ( Adjust carb)  ? ondansetron (ZOFRAN ODT) 4 MG disintegrating tablet Take 1 tablet (4 mg total) by mouth every 8 (eight) hours as needed for nausea or vomiting.  ? ? ?ROS:  ?Review of Systems  ?Constitutional:  Negative for chills and fever.  ?HENT:  Positive for congestion (and rhinorrhea).   ?Respiratory:  Positive for cough, sputum production and shortness  of breath.   ?Cardiovascular:  Positive for chest pain (only with cough).  ?Gastrointestinal:  Positive for constipation.  ?Neurological:  Positive for headaches.  ? ? ?Objective:  ? ?Today's Vitals: BP 124/72   Pulse 90   Temp 98.3 ?F (36.8 ?C) (Oral)   Ht 5' (1.524 m)   Wt 129 lb 4 oz (58.6 kg)   LMP 06/14/2021 (Exact Date)   SpO2 98%   BMI 25.24 kg/m?  ?Vitals with BMI 07/07/2021 06/24/2021 06/02/2021  ?Height 5' 0"  5' 1.25" 5' 0"   ?Weight 129 lbs 4 oz 121 lbs 127 lbs 3 oz  ?BMI 25.24 22.67 24.84  ?Systolic 956 387 564  ?Diastolic 72 77 68  ?Pulse 90 72 92  ?  ? ?Physical Exam ?Vitals reviewed. Exam conducted with a chaperone present.  ?Constitutional:   ?   Appearance: Normal appearance.  ?HENT:  ?   Head: Normocephalic and atraumatic.  ?   Right Ear: Tympanic membrane,  ear canal and external ear normal.  ?   Left Ear: Tympanic membrane, ear canal and external ear normal.  ?Eyes:  ?   General:     ?   Right eye: No discharge.     ?   Left eye: No discharge.  ?   Extraocular Movements: Extraocular movements intact.  ?   Conjunctiva/sclera: Conjunctivae normal.  ?   Pupils: Pupils are equal, round, and reactive to light.  ?Neck:  ?   Vascular: No carotid bruit.  ?Cardiovascular:  ?   Rate and Rhythm: Normal rate and regular rhythm.  ?   Pulses: Normal pulses.     ?     Dorsalis pedis pulses are 2+ on the right side and 2+ on the left side.  ?   Heart sounds: Normal heart sounds. No murmur heard. ?Pulmonary:  ?   Effort: Pulmonary effort is normal.  ?   Breath sounds: Normal breath sounds.  ?Chest:  ?Breasts: ?   Breasts are symmetrical.  ?   Right: Normal.  ?   Left: Normal.  ?Abdominal:  ?   General: Abdomen is flat. Bowel sounds are normal. There is no distension.  ?   Palpations: Abdomen is soft. There is no mass.  ?   Tenderness: There is no abdominal tenderness.  ?Musculoskeletal:     ?   General: No tenderness.  ?   Cervical back: Neck supple. No muscular tenderness.  ?   Right lower leg: No edema.  ?   Left lower leg: No edema.  ?   Right foot: No deformity.  ?   Left foot: No deformity.  ?Feet:  ?   Right foot:  ?   Protective Sensation: 10 sites tested.  10 sites sensed.  ?   Skin integrity: Skin integrity normal.  ?   Toenail Condition: Right toenails are normal.  ?   Left foot:  ?   Protective Sensation: 10 sites tested.  10 sites sensed.  ?   Skin integrity: Skin integrity normal.  ?   Toenail Condition: Left toenails are normal.  ?Lymphadenopathy:  ?   Cervical: No cervical adenopathy.  ?   Upper Body:  ?   Right upper body: No supraclavicular adenopathy.  ?   Left upper body: No supraclavicular adenopathy.  ?Skin: ?   General: Skin is warm and dry.  ?Neurological:  ?   General: No focal deficit present.  ?   Mental Status: She is alert and oriented to person, place,  and  time.  ?   Motor: No weakness.  ?   Gait: Gait normal.  ?Psychiatric:     ?   Mood and Affect: Mood normal.     ?   Behavior: Behavior normal.     ?   Judgment: Judgment normal.  ? ? ? ? ? ? ? ?Assessment and Plan  ? ?1. Encounter for general adult medical examination with abnormal findings   ?2. Allergic rhinitis, unspecified seasonality, unspecified trigger   ?3. Constipation, unspecified constipation type   ? ? ? ?Plan: ?See plan via problem list below. ? ? ? ?Tests ordered ?No orders of the defined types were placed in this encounter. ? ? ? ? ?Meds ordered this encounter  ?Medications  ? fluticasone (FLONASE) 50 MCG/ACT nasal spray  ?  Sig: Place 2 sprays into both nostrils daily.  ?  Dispense:  16 g  ?  Refill:  6  ?  Order Specific Question:   Supervising Provider  ?  Answer:   Binnie Rail [[id number]]  ? ? ?Patient to follow-up in 6 months or sooner as needed.  In addition to doing her annual physical exam today also did an office visit to address her concerns. ? ?Ailene Ards, NP ? ?

## 2021-07-25 NOTE — Progress Notes (Signed)
?Name: Judy Cook  ?MRN/ DOB: [id number], [birth year]   ?Age/ Sex: 21 y.o., female   ? ?PCP: Ailene Ards, NP   ?Reason for Endocrinology Evaluation: Type 1 Diabetes Mellitus  ?   ?Date of Initial Endocrinology Visit: 07/26/2021   ? ? ?PATIENT IDENTIFIER: [name] is a 21 y.o. female with a past medical history of T1DM. The patient presented for initial endocrinology clinic visit on 07/26/2021 for consultative assistance with her diabetes management.  ? ? ?HPI: ?[name] was  ? ? ?Diagnosed with DM at age 21  ?Currently checking blood sugars 2-3 x / day ?Hypoglycemia episodes : no                 ?Hemoglobin A1c has ranged from 10.7% in 2023, peaking at 12% in 2021. ?Patient required assistance for hypoglycemia: when she was young ?Patient has required hospitalization within the last 1 year from hyper or hypoglycemia: Patient with history of DKA but not in 2022 ? ?In terms of diet, the patient eats 3 meals a day, snacks twice daily, drinks sugar-sweetened beverages  ? ?She is on OCP's  ? ?HOME DIABETES REGIMEN: ?Levemir 23 units daily  ?NovoLog 12 units  ? ? ?Statin: no ?ACE-I/ARB: no ?Prior Diabetic Education: yes ? ? ?METER DOWNLOAD SUMMARY:  ? ? ?DIABETIC COMPLICATIONS: ?Microvascular complications:  ? ?Denies: CKD, retinopathy, neuropathy  ?Last eye exam: Completed years  ? ?Macrovascular complications:  ? ?Denies: CAD, PVD, CVA ? ? ?PAST HISTORY: ?Past Medical History:  ?Past Medical History:  ?Diagnosis Date  ? Diabetes mellitus ([place])   ? ?Past Surgical History: History reviewed. No pertinent surgical history.  ?Social History:  reports that she has been smoking cigarettes. She has never used smokeless tobacco. She reports current alcohol use. She reports current drug use. Drug: Marijuana. ?Family History:  ?Family History  ?Problem Relation Age of Onset  ? Diabetes Paternal Grandfather   ? Heart attack Paternal Grandfather   ? Diabetes Paternal Grandmother   ? Cancer Maternal  Grandmother   ?     lung  ? Alcohol abuse Maternal Grandfather   ? Alcohol abuse Father   ? Drug abuse Father   ? High Cholesterol Father   ? Alcohol abuse Mother   ? Depression Mother   ? Drug abuse Mother   ? ? ? ?HOME MEDICATIONS: ?Allergies as of 07/26/2021   ?No Known Allergies ?  ? ?  ?Medication List  ?  ? ?  ? Accurate as of July 26, 2021  8:50 AM. If you have any questions, ask your nurse or doctor.  ?  ?  ? ?  ? ?Accu-Chek Guide test strip ?Generic drug: glucose blood ?Use to check your blood sugar 4 times a day (before meals and at bedtime) ?  ?Accu-Chek Softclix Lancets lancets ?SMARTSIG:Topical 1-4 Times Daily ?  ?blood glucose meter kit and supplies ?Dispense based on patient and insurance preference. Use up to four times daily as directed. (FOR ICD-10 E10.9, E11.9). ?  ?fluticasone 50 MCG/ACT nasal spray ?Commonly known as: FLONASE ?Place 2 sprays into both nostrils daily. ?  ?insulin detemir 100 UNIT/ML injection ?Commonly known as: Levemir ?Inject 0.23 mLs (23 Units total) into the skin at bedtime. ?  ?norgestimate-ethinyl estradiol 0.25-35 MG-MCG tablet ?Commonly known as: ORTHO-CYCLEN ?Take 1 tablet by mouth daily. ?  ?NovoLOG FlexPen 100 UNIT/ML FlexPen ?Generic drug: insulin aspart ?Inject 12 Units into the skin 3 (three) times daily before meals. Ratio/sliding scale ( Adjust carb) ?  ?  ondansetron 4 MG disintegrating tablet ?Commonly known as: Zofran ODT ?Take 1 tablet (4 mg total) by mouth every 8 (eight) hours as needed for nausea or vomiting. ?  ?pantoprazole 40 MG tablet ?Commonly known as: Protonix ?Take 1 tablet (40 mg total) by mouth daily. ?  ?Potassium Chloride ER 20 MEQ Tbcr ?Take 20 mEq by mouth daily for 7 doses. ?  ? ?  ? ? ? ?ALLERGIES: ?No Known Allergies ? ? ?REVIEW OF SYSTEMS: ?A comprehensive ROS was conducted with the patient and is negative except as per HPI and below:  ?Review of Systems  ?Gastrointestinal:  Negative for diarrhea, nausea and vomiting.  ?Neurological:  Positive  for tingling.  ? ?  ?OBJECTIVE:  ? ?VITAL SIGNS: BP 124/70 (BP Location: Left Arm, Patient Position: Sitting, Cuff Size: Small)   Pulse 93   Ht 5' (1.524 m)   Wt 124 lb (56.2 kg)   SpO2 96%   BMI 24.22 kg/m?   ? ?PHYSICAL EXAM:  ?General: Pt appears well and is in NAD  ?Neck: General: Supple without adenopathy or carotid bruits. ?Thyroid: Thyroid size normal.  No goiter or nodules appreciated.   ?Lungs: Clear with good BS bilat with no rales, rhonchi, or wheezes  ?Heart: RRR with normal S1 and S2 and no gallops; no murmurs; no rub  ?Abdomen: Normoactive bowel sounds, soft, nontender, without masses or organomegaly palpable  ?Extremities:  ?Lower extremities - No pretibial edema. No lesions.  ?Neuro: MS is good with appropriate affect, pt is alert and Ox3  ? ? ?DM foot exam: 07/26/2021 ? ?The skin of the feet is intact without sores or ulcerations. ?The pedal pulses are 2+ on right and 2+ on left. ?The sensation is intact to a screening 5.07, 10 gram monofilament bilaterally ? ? ? ?DATA REVIEWED: ? ?Lab Results  ?Component Value Date  ? HGBA1C 10.7 (H) 06/02/2021  ? HGBA1C 12.0 (H) 12/03/2019  ? HGBA1C 10.8 (H) 02/20/2019  ? ? Latest Reference Range & Units 06/02/21 09:35  ?Sodium 135 - 145 mEq/L 133 (L)  ?Potassium 3.5 - 5.1 mEq/L 4.1  ?Chloride 96 - 112 mEq/L 97  ?CO2 19 - 32 mEq/L 28  ?Glucose 70 - 99 mg/dL 318 (H)  ?BUN 6 - 23 mg/dL 14  ?Creatinine 0.40 - 1.20 mg/dL 0.97  ?Calcium 8.4 - 10.5 mg/dL 9.7  ?Alkaline Phosphatase 39 - 117 U/L 69  ?Albumin 3.5 - 5.2 g/dL 4.0  ?AST 0 - 37 U/L 11  ?ALT 0 - 35 U/L 11  ?Total Protein 6.0 - 8.3 g/dL 7.0  ?Total Bilirubin 0.2 - 1.2 mg/dL 0.4  ?GFR >60.00 mL/min 83.10  ?Total CHOL/HDL Ratio  3  ?Cholesterol 0 - 200 mg/dL 165  ?HDL Cholesterol >39.00 mg/dL 59.20  ?LDL (calc) 0 - 99 mg/dL 88  ?MICROALB/CREAT RATIO 0.0 - 30.0 mg/g 1.8  ?NonHDL  105.38  ?Triglycerides 0.0 - 149.0 mg/dL 89.0  ?VLDL 0.0 - 40.0 mg/dL 17.8  ? ? Latest Reference Range & Units 06/02/21 09:35   ?Creatinine,U mg/dL 38.6  ?Microalb, Ur 0.0 - 1.9 mg/dL <0.7  ?MICROALB/CREAT RATIO 0.0 - 30.0 mg/g 1.8  ? ? ?Old records , labs and images have been reviewed.  ? ? ?ASSESSMENT / PLAN / RECOMMENDATIONS:  ? ?1) Type 1 Diabetes Mellitus, poorly controlled, With out complications - Most recent A1c of 10.7%. Goal A1c <7.0%.   ? ?Plan: ?GENERAL: ?I have discussed with the patient the pathophysiology of diabetes. We went over the natural progression of  the disease. I explained the complications associated with diabetes including retinopathy, nephropathy, neuropathy as well as increased risk of cardiovascular disease. We went over the benefit seen with glycemic control.  ?I explained to the patient that diabetic patients are at higher than normal risk for amputations.  ?Pt urged to stop all sugar-sweetened beverages  ?Pt advised to avoid conception until A1c 7.0 %  ?Will switch Levemir to another basal insulin  ?Dexcom prescription and insulin has been sent to pharmacy  ?Will refer to our CDE for tandem pump training ?Patient advised to download carbs to go app to start familiarize herself with carb counting ? ?MEDICATIONS: ?Stop Levemir  ?Start Tresiba 22 units daily  ?Novolog 14 units with each ?Correction scale (BG- 120/60) ? ?EDUCATION / INSTRUCTIONS: ?BG monitoring instructions: Patient is instructed to check her blood sugars 3  x daily before meals   ?Call [facility] if: BG persistently < 70  ?I reviewed the Rule of 15 for the treatment of hypoglycemia in detail with the patient. Literature supplied. ? ? ?2) Diabetic complications:  ?Eye: Does not have known diabetic retinopathy.  ?Neuro/ Feet: Does not have known diabetic peripheral neuropathy. ?Renal: Patient does not have known baseline CKD. She is not on an ACEI/ARB at present. ? ? ? ?3) Lipids: No indication for statin therapy at this time ? ? ?  ? ? ? ? ? ?Signed electronically by: ?Abby Nena Jordan, MD ? ?[facility]  ?[facility] ?[address] ?[place], [place] [address] ?Phone: [phone] ?FAX: [phone]  ? ?CC: ?Ailene Ards, NP ?[place] [address] ?Phone: [phone]  ?Fax: 3

## 2021-07-26 ENCOUNTER — Ambulatory Visit (INDEPENDENT_AMBULATORY_CARE_PROVIDER_SITE_OTHER): Payer: Federal, State, Local not specified - PPO | Admitting: Internal Medicine

## 2021-07-26 ENCOUNTER — Encounter: Payer: Self-pay | Admitting: Internal Medicine

## 2021-07-26 VITALS — BP 124/70 | HR 93 | Ht 60.0 in | Wt 124.0 lb

## 2021-07-26 DIAGNOSIS — E1065 Type 1 diabetes mellitus with hyperglycemia: Secondary | ICD-10-CM

## 2021-07-26 DIAGNOSIS — R739 Hyperglycemia, unspecified: Secondary | ICD-10-CM

## 2021-07-26 LAB — POCT GLUCOSE (DEVICE FOR HOME USE): Glucose Fasting, POC: 241 mg/dL — AB (ref 70–99)

## 2021-07-26 MED ORDER — DEXCOM G6 TRANSMITTER MISC: 1.0000 | 3 refills | Status: DC

## 2021-07-26 MED ORDER — TRESIBA FLEXTOUCH 100 UNIT/ML ~~LOC~~ SOPN: 22.0000 [IU] | PEN_INJECTOR | Freq: Every day | SUBCUTANEOUS | 3 refills | Status: DC

## 2021-07-26 MED ORDER — DEXCOM G6 SENSOR MISC: 1.0000 | 3 refills | Status: DC

## 2021-07-26 NOTE — Patient Instructions (Signed)
Switch Levemir to [place] 22 units ONCE daily  ?Novolog 14 units with each meal three times daily  ?Novolog correctional insulin: ADD extra units on insulin to your meal-time Novolog dose if your blood sugars are higher than 180. Use the scale below to help guide you:  ? ?Blood sugar before meal Number of units to inject  ?Less than 180 0 unit  ?181 -  240 1 units  ?241 -  300 2 units  ?301 -  360 3 units  ?361 -  420 4 units  ?421 -  480 5 units  ? ?HOW TO TREAT LOW BLOOD SUGARS (Blood sugar LESS THAN 70 MG/DL) ?Please follow the RULE OF 15 for the treatment of hypoglycemia treatment (when your (blood sugars are less than 70 mg/dL)  ? ?STEP 1: Take 15 grams of carbohydrates when your blood sugar is low, which includes:  ?3-4 GLUCOSE TABS  OR ?3-4 OZ OF JUICE OR REGULAR SODA OR ?ONE TUBE OF GLUCOSE GEL   ? ?STEP 2: RECHECK blood sugar in 15 MINUTES ?STEP 3: If your blood sugar is still low at the 15 minute recheck --> then, go back to STEP 1 and treat AGAIN with another 15 grams of carbohydrates. ? ?

## 2021-08-07 ENCOUNTER — Encounter (HOSPITAL_COMMUNITY): Payer: Self-pay | Admitting: *Deleted

## 2021-08-07 ENCOUNTER — Ambulatory Visit (HOSPITAL_COMMUNITY)
Admission: EM | Admit: 2021-08-07 | Discharge: 2021-08-07 | Disposition: A | Discharge status: Home / Self Care | Payer: Federal, State, Local not specified - PPO | Attending: Emergency Medicine | Admitting: Emergency Medicine

## 2021-08-07 DIAGNOSIS — F1290 Cannabis use, unspecified, uncomplicated: Secondary | ICD-10-CM

## 2021-08-07 DIAGNOSIS — R112 Nausea with vomiting, unspecified: Secondary | ICD-10-CM | POA: Diagnosis not present

## 2021-08-07 DIAGNOSIS — Z20822 Contact with and (suspected) exposure to covid-19: Secondary | ICD-10-CM

## 2021-08-07 LAB — POCT URINALYSIS DIPSTICK, ED / UC
Glucose, UA: 250 mg/dL — AB
Ketones, ur: 40 mg/dL — AB
Leukocytes,Ua: NEGATIVE
Nitrite: NEGATIVE
Protein, ur: >=300 mg/dL — AB
Specific Gravity, Urine: 1.02 (ref 1.005–1.030)
Urobilinogen, UA: 1 mg/dL (ref 0.0–1.0)
pH: 7 (ref 5.0–8.0)

## 2021-08-07 LAB — CBG MONITORING, ED
Glucose-Capillary: 137 mg/dL — ABNORMAL HIGH (ref 70–99)
Glucose-Capillary: 151 mg/dL — ABNORMAL HIGH (ref 70–99)

## 2021-08-07 LAB — POC URINE PREG, ED: Preg Test, Ur: NEGATIVE

## 2021-08-07 MED ORDER — CAPSAICIN 0.075 % EX CREA: 1.0000 "application " | TOPICAL_CREAM | Freq: Two times a day (BID) | CUTANEOUS | 1 refills | Status: DC

## 2021-08-07 MED ORDER — ONDANSETRON 4 MG PO TBDP
ORAL_TABLET | ORAL | Status: AC
  Filled 2021-08-07: qty 1

## 2021-08-07 MED ORDER — ONDANSETRON 8 MG PO TBDP
8.0000 mg | ORAL_TABLET | Freq: Three times a day (TID) | ORAL | 0 refills | Status: DC | PRN
Start: 2021-08-07 — End: 2022-02-06

## 2021-08-07 MED ORDER — ONDANSETRON 4 MG PO TBDP
4.0000 mg | ORAL_TABLET | Freq: Once | ORAL | Status: AC
  Administered 2021-08-07: 4 mg via ORAL

## 2021-08-07 NOTE — Discharge Instructions (Addendum)
I strongly recommend that you quit using marijuana.  At this point, it is no longer working for you, it is definitely working against you. ? ?Because the dose of Zofran you received in the clinic today it was helpful, I provided you with another prescription for an 8 mg tablet that you can take every 8 hours as needed to stay ahead of your nausea.  Also your prescription for capsaicin topical cream that you can apply twice daily to your chest and abdomen area to see if this helps relieve your symptoms. ? ?Thank you for visiting urgent care today.  I hope you feel better soon. ?

## 2021-08-07 NOTE — ED Triage Notes (Addendum)
C/O vomiting, diarrhea, congestion, chills, intermittent generalized "nagging" cramping onset @ 0300. Able to keep down some tiny sips water. Pt is diabetic. ?Reports CBG = 200 @ approx 1100. ?Wishes to be tested for Covid. ?

## 2021-08-07 NOTE — ED Provider Notes (Signed)
?[facility] ? ? ? ?CSN: [id number] ?Arrival date & time: 08/07/21  1127 ?  ? ?HISTORY  ? ?Chief Complaint  ?Patient presents with  ? Vomiting  ? Diarrhea  ? ?HPI ?Judy Cook is a 21 y.o. female. Patient complains of vomiting and diarrhea along with nagging cramping that began around 3 AM this morning.  Patient states she is able to keep down tiny sips of water.  Patient reports she is a type I diabetic and had a fingerstick blood glucose level of 200 around 11 AM, patient had a fingerstick blood glucose level of 137 during her visit today.  Urine dip was concerning for significant amount of glucose, protein and ketones.  Patient reports single episode of diarrhea, does not feel that she needs to move her bowels at this time.  Patient states she has not vomited in several hours.  Patient was given Zofran 4 mg on arrival prior to provider evaluation.  Patient reports daily use of marijuana.  Patient has been seen at urgent care in the emergency room several times over the past few years for similar complaints of intractable vomiting, nausea and diarrhea. ? ?The history is provided by the patient.  ?Past Medical History:  ?Diagnosis Date  ? Diabetes mellitus ([place])   ? ?Patient Active Problem List  ? Diagnosis Date Noted  ? Allergic rhinitis 07/07/2021  ? Encounter for general adult medical examination with abnormal findings 07/07/2021  ? Constipation 07/07/2021  ? History of herpes genitalis 06/24/2021  ? Intractable nausea and vomiting 12/06/2019  ? DKA (diabetic ketoacidoses) 12/03/2019  ? SIRS (systemic inflammatory response syndrome) ([place]) 12/03/2019  ? Hyperkalemia 12/03/2019  ? Hyperglycemia   ? Intractable vomiting   ? Dysmenorrhea 02/20/2019  ? Marijuana use 02/20/2019  ? Cyclical vomiting 16/94/5038  ? Hypokalemia 02/20/2019  ? Hyperglycemia due to type 1 diabetes mellitus ([place]) 02/19/2019  ? Type 1 diabetes mellitus without complication ([place]) 88/28/0034  ? ?History reviewed. No  pertinent surgical history. ?OB History   ? ? Gravida  ?0  ? Para  ?0  ? Term  ?0  ? Preterm  ?0  ? AB  ?0  ? Living  ?0  ?  ? ? SAB  ?0  ? IAB  ?0  ? Ectopic  ?0  ? Multiple  ?0  ? Live Births  ?0  ?   ?  ?  ? ?Home Medications   ? ?Prior to Admission medications   ?Medication Sig Start Date End Date Taking? Authorizing Provider  ?capsicum (ZOSTRIX) 0.075 % topical cream Apply 1 application. topically 2 (two) times daily. 08/07/21  Yes Lynden Oxford Scales, PA-C  ?fluticasone (FLONASE) 50 MCG/ACT nasal spray Place 2 sprays into both nostrils daily. 07/07/21  Yes Ailene Ards, NP  ?insulin degludec (TRESIBA FLEXTOUCH) 100 UNIT/ML FlexTouch Pen Inject 22 Units into the skin daily. 07/26/21  Yes Shamleffer, Melanie Crazier, MD  ?norgestimate-ethinyl estradiol (ORTHO-CYCLEN) 0.25-35 MG-MCG tablet Take 1 tablet by mouth daily. 06/24/21  Yes Luvenia Redden, PA-C  ?NOVOLOG FLEXPEN 100 UNIT/ML FlexPen Inject 12 Units into the skin 3 (three) times daily before meals. Ratio/sliding scale ( Adjust carb) 06/02/21  Yes Ailene Ards, NP  ?ondansetron (ZOFRAN-ODT) 8 MG disintegrating tablet Take 1 tablet (8 mg total) by mouth every 8 (eight) hours as needed for nausea or vomiting. 08/07/21  Yes Lynden Oxford Scales, PA-C  ?ACCU-CHEK GUIDE test strip Use to check your blood sugar 4 times a day (before meals and at  bedtime) 06/02/21   Ailene Ards, NP  ?Accu-Chek Softclix Lancets lancets SMARTSIG:Topical 1-4 Times Daily 01/25/21   [provider]  ?blood glucose meter kit and supplies Dispense based on patient and insurance preference. Use up to four times daily as directed. (FOR ICD-10 E10.9, E11.9). 01/25/21   Isla Pence, MD  ?Continuous Blood Gluc Sensor (DEXCOM G6 SENSOR) MISC 1 Device by Does not apply route as directed. 07/26/21   Shamleffer, Melanie Crazier, MD  ?Continuous Blood Gluc Transmit (DEXCOM G6 TRANSMITTER) MISC 1 Device by Does not apply route as directed. 07/26/21   Shamleffer, Melanie Crazier, MD   ?pantoprazole (PROTONIX) 40 MG tablet Take 1 tablet (40 mg total) by mouth daily. 12/07/19 01/06/20  Antonieta Pert, MD  ?potassium chloride 20 MEQ TBCR Take 20 mEq by mouth daily for 7 doses. 12/07/19 12/14/19  Antonieta Pert, MD  ? ? ?Family History ?Family History  ?Problem Relation Age of Onset  ? Diabetes Paternal Grandfather   ? Heart attack Paternal Grandfather   ? Diabetes Paternal Grandmother   ? Cancer Maternal Grandmother   ?     lung  ? Alcohol abuse Maternal Grandfather   ? Alcohol abuse Father   ? Drug abuse Father   ? High Cholesterol Father   ? Alcohol abuse Mother   ? Depression Mother   ? Drug abuse Mother   ? ?Social History ?Social History  ? ?Tobacco Use  ? Smoking status: Former  ?  Types: Cigarettes  ? Smokeless tobacco: Never  ?Vaping Use  ? Vaping Use: Every day  ? Substances: Nicotine, THC  ?Substance Use Topics  ? Alcohol use: Yes  ?  Comment: socially  ? Drug use: Yes  ?  Types: Marijuana  ? ?Allergies   ?Patient has no known allergies. ? ?Review of Systems ?Review of Systems ?Pertinent findings noted in history of present illness.  ? ?Physical Exam ?Triage Vital Signs ?ED Triage Vitals  ?Enc Vitals Group  ?   BP 02/18/21 0827 (!) 147/82  ?   Pulse Rate 02/18/21 0827 72  ?   Resp 02/18/21 0827 18  ?   Temp 02/18/21 0827 98.3 ?F (36.8 ?C)  ?   Temp Source 02/18/21 0827 Oral  ?   SpO2 02/18/21 0827 98 %  ?   Weight --   ?   Height --   ?   Head Circumference --   ?   Peak Flow --   ?   Pain Score 02/18/21 0826 5  ?   Pain Loc --   ?   Pain Edu? --   ?   Excl. in [place]? --   ?No data found. ? ?Updated Vital Signs ?BP 130/76   Pulse 87   Temp 98.1 ?F (36.7 ?C) (Oral)   Resp 18   LMP 07/12/2021 (Exact Date)   SpO2 97%  ? ?Physical Exam ?Vitals and nursing note reviewed.  ?Constitutional:   ?   General: She is not in acute distress. ?   Appearance: Normal appearance. She is normal weight. She is not ill-appearing, toxic-appearing or diaphoretic.  ?HENT:  ?   Head: Normocephalic and atraumatic.  ?    Right Ear: Tympanic membrane, ear canal and external ear normal.  ?   Left Ear: Tympanic membrane, ear canal and external ear normal.  ?   Nose: Nose normal.  ?   Mouth/Throat:  ?   Mouth: Mucous membranes are moist.  ?   Pharynx: Oropharynx is clear.  No oropharyngeal exudate or posterior oropharyngeal erythema.  ?Eyes:  ?   General: Lids are normal.     ?   Right eye: No discharge.     ?   Left eye: No discharge.  ?   Extraocular Movements: Extraocular movements intact.  ?   Conjunctiva/sclera: Conjunctivae normal.  ?   Right eye: Right conjunctiva is not injected.  ?   Left eye: Left conjunctiva is not injected.  ?   Pupils: Pupils are equal, round, and reactive to light.  ?Neck:  ?   Vascular: No carotid bruit.  ?   Trachea: Trachea and phonation normal.  ?Cardiovascular:  ?   Rate and Rhythm: Normal rate and regular rhythm.  ?   Pulses: Normal pulses.  ?   Heart sounds: Normal heart sounds. No murmur heard. ?  No friction rub. No gallop.  ?Pulmonary:  ?   Effort: Pulmonary effort is normal. No accessory muscle usage, prolonged expiration or respiratory distress.  ?   Breath sounds: Normal breath sounds. No stridor, decreased air movement or transmitted upper airway sounds. No decreased breath sounds, wheezing, rhonchi or rales.  ?Chest:  ?   Chest wall: No tenderness.  ?Abdominal:  ?   General: Abdomen is flat. Bowel sounds are normal. There is no distension.  ?   Palpations: Abdomen is soft.  ?   Tenderness: There is no abdominal tenderness. There is no right CVA tenderness or left CVA tenderness.  ?   Hernia: No hernia is present.  ?Musculoskeletal:     ?   General: Normal range of motion.  ?   Cervical back: Normal range of motion and neck supple. No rigidity or tenderness. Normal range of motion.  ?Lymphadenopathy:  ?   Cervical: No cervical adenopathy.  ?Skin: ?   General: Skin is warm and dry.  ?   Findings: No erythema or rash.  ?Neurological:  ?   General: No focal deficit present.  ?   Mental Status:  She is alert and oriented to person, place, and time.  ?Psychiatric:     ?   Mood and Affect: Mood normal.     ?   Behavior: Behavior normal.  ? ? ?Visual Acuity ?Right Eye Distance:   ?Left Eye Distance:   ?Bilateral Distance:

## 2021-08-08 ENCOUNTER — Other Ambulatory Visit: Payer: Self-pay | Admitting: Internal Medicine

## 2021-08-08 ENCOUNTER — Other Ambulatory Visit (HOSPITAL_COMMUNITY): Payer: Self-pay

## 2021-08-08 ENCOUNTER — Telehealth: Payer: Self-pay

## 2021-08-08 LAB — SARS CORONAVIRUS 2 (TAT 6-24 HRS): SARS Coronavirus 2: NEGATIVE

## 2021-08-08 MED ORDER — LANTUS SOLOSTAR 100 UNIT/ML ~~LOC~~ SOPN: 22.0000 [IU] | PEN_INJECTOR | Freq: Every day | SUBCUTANEOUS | 3 refills | Status: DC

## 2021-08-08 NOTE — Telephone Encounter (Signed)
Requesting a script for Lantus but I don't show on the list  ?

## 2021-08-08 NOTE — Telephone Encounter (Signed)
Called pharmacy. Pt picked up [place] on yesterday. Lantus not needed? FYI Basaglar is a preferred alternative for the Lantus.

## 2021-08-10 ENCOUNTER — Telehealth: Payer: Self-pay | Admitting: Nutrition

## 2021-08-10 NOTE — Telephone Encounter (Signed)
Patient called saying that she wants the Tandem insulin pump.  She was told to go online to order this.  She will then hear back from [employer] about her cost for this pump and supplies.  If she agrees, paperwork will then get sent to Dr. Quin Hoop office for the prescription to be sent to her insurance company and she will then get the pump.  She then calls me back and we train her on this.   ?She agreed to do this. ?

## 2021-08-19 ENCOUNTER — Telehealth: Payer: Self-pay | Admitting: Internal Medicine

## 2021-08-19 ENCOUNTER — Other Ambulatory Visit: Payer: Self-pay | Admitting: Internal Medicine

## 2021-08-19 DIAGNOSIS — E1065 Type 1 diabetes mellitus with hyperglycemia: Secondary | ICD-10-CM

## 2021-08-19 NOTE — Telephone Encounter (Signed)
Patient has been scheduled

## 2021-08-19 NOTE — Telephone Encounter (Signed)
Can you please let the patient know that part of his tandem insulin pump preparation he will need to stop by the lab (fasting) for a glucose and a C-peptide reading ? ? ?Please schedule patient for lab appointment ? ? ?Thanks ?

## 2021-08-24 ENCOUNTER — Other Ambulatory Visit (INDEPENDENT_AMBULATORY_CARE_PROVIDER_SITE_OTHER): Payer: Federal, State, Local not specified - PPO

## 2021-08-24 DIAGNOSIS — E1065 Type 1 diabetes mellitus with hyperglycemia: Secondary | ICD-10-CM

## 2021-08-25 LAB — C-PEPTIDE: C-Peptide: <0.1 ng/mL — ABNORMAL LOW (ref 0.80–3.85)

## 2021-08-25 LAB — GLUCOSE, RANDOM: Glucose, Plasma: 83 mg/dL (ref 65–139)

## 2021-08-31 ENCOUNTER — Encounter: Payer: Federal, State, Local not specified - PPO | Admitting: Nutrition

## 2021-10-07 DIAGNOSIS — E1065 Type 1 diabetes mellitus with hyperglycemia: Secondary | ICD-10-CM | POA: Diagnosis not present

## 2021-11-01 ENCOUNTER — Telehealth (HOSPITAL_BASED_OUTPATIENT_CLINIC_OR_DEPARTMENT_OTHER): Payer: Self-pay | Admitting: Obstetrics & Gynecology

## 2021-11-01 NOTE — Telephone Encounter (Signed)
Called patient and left a message to call the office back to set up appointment. ?

## 2021-11-08 ENCOUNTER — Telehealth: Payer: Self-pay | Admitting: Nutrition

## 2021-11-09 NOTE — Telephone Encounter (Signed)
Appointment scheduled for pump start next week. 

## 2021-11-14 ENCOUNTER — Encounter: Payer: Federal, State, Local not specified - PPO | Attending: Internal Medicine | Admitting: Nutrition

## 2021-11-14 DIAGNOSIS — E109 Type 1 diabetes mellitus without complications: Secondary | ICD-10-CM | POA: Insufficient documentation

## 2021-11-14 NOTE — Patient Instructions (Signed)
Call when new pump comes in to set up time for training

## 2021-11-14 NOTE — Progress Notes (Signed)
Patient was sent a basal IQ insulin pump.  [facility] was contacted and they are issuing her a return authorization, and sending her the Control IQ which was ordered for her.  She took her 70/30 insulin this AM, so was told to continue this until her new pump arrives and to call me when it comes in.

## 2021-12-05 ENCOUNTER — Encounter: Payer: Self-pay | Admitting: Internal Medicine

## 2021-12-05 ENCOUNTER — Ambulatory Visit (INDEPENDENT_AMBULATORY_CARE_PROVIDER_SITE_OTHER): Payer: Federal, State, Local not specified - PPO | Admitting: Internal Medicine

## 2021-12-05 VITALS — BP 126/84 | HR 74 | Ht 60.0 in | Wt 151.6 lb

## 2021-12-05 DIAGNOSIS — E1065 Type 1 diabetes mellitus with hyperglycemia: Secondary | ICD-10-CM | POA: Diagnosis not present

## 2021-12-05 LAB — POCT GLYCOSYLATED HEMOGLOBIN (HGB A1C): Hemoglobin A1C: 8.7 % — AB (ref 4.0–5.6)

## 2021-12-05 MED ORDER — INSULIN ASPART 100 UNIT/ML IJ SOLN: INTRAMUSCULAR | 11 refills | Status: DC

## 2021-12-05 NOTE — Progress Notes (Signed)
Name: Judy Cook  MRN/ DOB: [id number], [birth year]   Age/ Sex: 21 y.o., female    PCP: Ailene Ards, NP   Reason for Endocrinology Evaluation: Type 1 Diabetes Mellitus     Date of Initial Endocrinology Visit: 07/26/2021    PATIENT IDENTIFIER: Judy Cook is a 21 y.o. female with a past medical history of T1DM. The patient presented for initial endocrinology clinic visit on 07/26/2021  for consultative assistance with her diabetes management.    HPI: Judy Cook was    Diagnosed with DM at age 17               Hemoglobin A1c has ranged from 10.7% in 2023, peaking at 12% in 2021. Patient required assistance for hypoglycemia: when she was young Patient has required hospitalization within the last 1 year from hyper or hypoglycemia: Patient with history of DKA but not in 2022  She is on OCP's    On her initial visit to our clinic she had an A1c of 10.7%, she was referred for pump training on the Tandem 10/2021   SUBJECTIVE:   During the last visit (07/26/2021): A1c 10.7% . Referred for pump training   Today (12/05/21): Judy Cook is here for a follow up on diabetes management. She checks her blood sugars multiple  times daily through CGM. The patient has  had hypoglycemic episodes since the last clinic visit.  Has occasional nausea and diarrhea  Denies palpitations  She has been consuming  ETOH at night   HOME DIABETES REGIMEN: Tresiba 22 units daily  Novolog 14 units TIDQAC Correction scale (BG- 120/60) - not using      Statin: no ACE-I/ARB: no Prior Diabetic Education: yes   CONTINUOUS GLUCOSE MONITORING RECORD INTERPRETATION    Dates of Recording: 8/1-8/14/2023  Sensor description:dexcom  Results statistics:   CGM use % of time 64  Average and SD 218/88  Time in range   34     %  % Time Above 180 25  % Time above 250 37  % Time Below target 3    Glycemic patterns summary: hyperglycemia during the day and night    Hyperglycemic episodes  postprandial   Hypoglycemic episodes occurred post bolus   Overnight periods: high       DIABETIC COMPLICATIONS: Microvascular complications:   Denies: CKD, retinopathy, neuropathy  Last eye exam: Completed years   Macrovascular complications:   Denies: CAD, PVD, CVA   PAST HISTORY: Past Medical History:  Past Medical History:  Diagnosis Date   Diabetes mellitus ([place])    Past Surgical History: No past surgical history on file.  Social History:  reports that she has quit smoking. Her smoking use included cigarettes. She has never used smokeless tobacco. She reports current alcohol use. She reports current drug use. Drug: Marijuana. Family History:  Family History  Problem Relation Age of Onset   Diabetes Paternal Grandfather    Heart attack Paternal Grandfather    Diabetes Paternal Grandmother    Cancer Maternal Grandmother        lung   Alcohol abuse Maternal Grandfather    Alcohol abuse Father    Drug abuse Father    High Cholesterol Father    Alcohol abuse Mother    Depression Mother    Drug abuse Mother      HOME MEDICATIONS: Allergies as of 12/05/2021   No Known Allergies      Medication List        Accurate as of  December 05, 2021  9:04 AM. If you have any questions, ask your nurse or doctor.          STOP taking these medications    Lantus SoloStar 100 UNIT/ML Solostar Pen Generic drug: insulin glargine Stopped by: Dorita Sciara, MD       TAKE these medications    Accu-Chek Guide test strip Generic drug: glucose blood Use to check your blood sugar 4 times a day (before meals and at bedtime)   Accu-Chek Softclix Lancets lancets SMARTSIG:Topical 1-4 Times Daily   blood glucose meter kit and supplies Dispense based on patient and insurance preference. Use up to four times daily as directed. (FOR ICD-10 E10.9, E11.9).   capsicum 0.075 % topical cream Commonly known as: ZOSTRIX Apply 1 application.  topically 2 (two) times daily.   Dexcom G6 Sensor Misc 1 Device by Does not apply route as directed.   Dexcom G6 Transmitter Misc 1 Device by Does not apply route as directed.   fluticasone 50 MCG/ACT nasal spray Commonly known as: FLONASE Place 2 sprays into both nostrils daily.   norgestimate-ethinyl estradiol 0.25-35 MG-MCG tablet Commonly known as: ORTHO-CYCLEN Take 1 tablet by mouth daily.   NovoLOG FlexPen 100 UNIT/ML FlexPen Generic drug: insulin aspart Inject 12 Units into the skin 3 (three) times daily before meals. Ratio/sliding scale ( Adjust carb)   ondansetron 8 MG disintegrating tablet Commonly known as: ZOFRAN-ODT Take 1 tablet (8 mg total) by mouth every 8 (eight) hours as needed for nausea or vomiting.   pantoprazole 40 MG tablet Commonly known as: Protonix Take 1 tablet (40 mg total) by mouth daily.   Potassium Chloride ER 20 MEQ Tbcr Take 20 mEq by mouth daily for 7 doses.   Tyler Aas FlexTouch 100 UNIT/ML FlexTouch Pen Generic drug: insulin degludec Inject 22 Units into the skin daily.         ALLERGIES: No Known Allergies   REVIEW OF SYSTEMS: A comprehensive ROS was conducted with the patient and is negative except as per HPI and below:      OBJECTIVE:   VITAL SIGNS: BP 126/84 (BP Location: Left Arm, Patient Position: Sitting, Cuff Size: Small)   Pulse 74   Ht 5' (1.524 m)   Wt 151 lb 9.6 oz (68.8 kg)   SpO2 99%   BMI 29.61 kg/m    PHYSICAL EXAM:  General: Pt appears well and is in NAD  Lungs: Clear with good BS bilat with no rales, rhonchi, or wheezes  Heart: RRR with normal S1 and S2 and no gallops; no murmurs; no rub  Abdomen: Normoactive bowel sounds, soft, nontender, without masses or organomegaly palpable  Extremities:  Lower extremities - No pretibial edema. No lesions.  Neuro: MS is good with appropriate affect, pt is alert and Ox3    DM foot exam: 07/26/2021  The skin of the feet is intact without sores or  ulcerations. The pedal pulses are 2+ on right and 2+ on left. The sensation is intact to a screening 5.07, 10 gram monofilament bilaterally    DATA REVIEWED:  Lab Results  Component Value Date   HGBA1C 8.7 (A) 12/05/2021   HGBA1C 10.7 (H) 06/02/2021   HGBA1C 12.0 (H) 12/03/2019    Latest Reference Range & Units 06/02/21 09:35  Sodium 135 - 145 mEq/L 133 (L)  Potassium 3.5 - 5.1 mEq/L 4.1  Chloride 96 - 112 mEq/L 97  CO2 19 - 32 mEq/L 28  Glucose 70 - 99 mg/dL 318 (H)  BUN 6 - 23 mg/dL 14  Creatinine 0.40 - 1.20 mg/dL 0.97  Calcium 8.4 - 10.5 mg/dL 9.7  Alkaline Phosphatase 39 - 117 U/L 69  Albumin 3.5 - 5.2 g/dL 4.0  AST 0 - 37 U/L 11  ALT 0 - 35 U/L 11  Total Protein 6.0 - 8.3 g/dL 7.0  Total Bilirubin 0.2 - 1.2 mg/dL 0.4  GFR >60.00 mL/min 83.10  Total CHOL/HDL Ratio  3  Cholesterol 0 - 200 mg/dL 165  HDL Cholesterol >39.00 mg/dL 59.20  LDL (calc) 0 - 99 mg/dL 88  MICROALB/CREAT RATIO 0.0 - 30.0 mg/g 1.8  NonHDL  105.38  Triglycerides 0.0 - 149.0 mg/dL 89.0  VLDL 0.0 - 40.0 mg/dL 17.8    Latest Reference Range & Units 06/02/21 09:35  Creatinine,U mg/dL 38.6  Microalb, Ur 0.0 - 1.9 mg/dL <0.7  MICROALB/CREAT RATIO 0.0 - 30.0 mg/g 1.8      ASSESSMENT / PLAN / RECOMMENDATIONS:   1) Type 1 Diabetes Mellitus, poorly controlled, With out complications - Most recent A1c of 8.7%. Goal A1c <7.0%.    -I have praised the patient improved glycemic control with A1c at 8.7%, down from 10.7% -In reviewing Dexcom downloads, the patient has been noted with severe glycemic excursions with BG's ranging from 60s to 300 mg/DL, unfortunately she continues to take prandial dose of insulin after the meal rather than before the meal, she also has not been using correction scale -I am not going to change her Tresiba dose at this time -She was again advised to take her prandial dose of insulin before the meal rather than after the meal, I am also going to reduce it as below, as she has  been noted with hypoglycemia post bolus -I have discouraged the patient from using alcohol, as it can be unpredictable with insulin use causing either severe hyperglycemia or hypoglycemia  -She is waiting on the tandem pump delivery, she has already been trained on using it   MEDICATIONS:  Continue Tresiba 22 units daily  Decrease Novolog 10 units with each Correction scale (BG- 120/60)  EDUCATION / INSTRUCTIONS: BG monitoring instructions: Patient is instructed to check her blood sugars 3  x daily before meals   Call [facility] if: BG persistently < 70  I reviewed the Rule of 15 for the treatment of hypoglycemia in detail with the patient. Literature supplied.   2) Diabetic complications:  Eye: Does not have known diabetic retinopathy.  Neuro/ Feet: Does not have known diabetic peripheral neuropathy. Renal: Patient does not have known baseline CKD. She is not on an ACEI/ARB at present.    3) Lipids: No indication for statin therapy at this time      Follow-up in 6 months    Signed electronically by: Mack Guise, MD  [facility]  [facility] [address] [place], [place] [address] Phone: [phone] FAX: [phone]   CC: Ailene Ards, NP [place] [address] Phone: [phone]  Fax: [phone]    Return to Endocrinology clinic as below: Future Appointments  Date Time Provider [place]  01/12/2022 10:00 AM Ailene Ards, NP [facility] None

## 2021-12-05 NOTE — Patient Instructions (Signed)
Continue Tresiba 22 units ONCE daily  Change Novolog 10 units WITH each meal three times daily  Novolog correctional insulin: ADD extra units on insulin to your meal-time Novolog dose if your blood sugars are higher than 180. Use the scale below to help guide you:   Blood sugar before meal Number of units to inject  Less than 180 0 unit  181 -  240 1 units  241 -  300 2 units  301 -  360 3 units  361 -  420 4 units  421 -  480 5 units   HOW TO TREAT LOW BLOOD SUGARS (Blood sugar LESS THAN 70 MG/DL) Please follow the RULE OF 15 for the treatment of hypoglycemia treatment (when your (blood sugars are less than 70 mg/dL)   STEP 1: Take 15 grams of carbohydrates when your blood sugar is low, which includes:  3-4 GLUCOSE TABS  OR 3-4 OZ OF JUICE OR REGULAR SODA OR ONE TUBE OF GLUCOSE GEL    STEP 2: RECHECK blood sugar in 15 MINUTES STEP 3: If your blood sugar is still low at the 15 minute recheck --> then, go back to STEP 1 and treat AGAIN with another 15 grams of carbohydrates.

## 2021-12-07 ENCOUNTER — Encounter: Payer: Self-pay | Admitting: Internal Medicine

## 2021-12-10 ENCOUNTER — Ambulatory Visit (INDEPENDENT_AMBULATORY_CARE_PROVIDER_SITE_OTHER): Payer: Federal, State, Local not specified - PPO

## 2021-12-10 ENCOUNTER — Ambulatory Visit
Admission: EM | Admit: 2021-12-10 | Discharge: 2021-12-10 | Disposition: A | Discharge status: Home / Self Care | Payer: Federal, State, Local not specified - PPO | Attending: Physician Assistant | Admitting: Physician Assistant

## 2021-12-10 DIAGNOSIS — S93402A Sprain of unspecified ligament of left ankle, initial encounter: Secondary | ICD-10-CM | POA: Diagnosis not present

## 2021-12-10 DIAGNOSIS — M79672 Pain in left foot: Secondary | ICD-10-CM | POA: Diagnosis not present

## 2021-12-10 DIAGNOSIS — S99922A Unspecified injury of left foot, initial encounter: Secondary | ICD-10-CM | POA: Diagnosis not present

## 2021-12-10 NOTE — Discharge Instructions (Signed)
Recommend ice, rest, elevation Can take NSAIDS as needed Activity as tolerated.   If no improvement follow up with orthopedics

## 2021-12-10 NOTE — ED Provider Notes (Signed)
[facility]    CSN: [id number] Arrival date & time: 12/10/21  0943      History   Chief Complaint No chief complaint on file.   HPI Judy Cook is a 21 y.o. female.   Pt complains of left ankle pain that started last night.  Reports she was wearing heels and when she stepped into a hole, rolling the ankle; inversion injury.  She reports pain with ambulation.  She reports mild swelling.  She has taken Aleve with minimal relief.  No other injuries.     Past Medical History:  Diagnosis Date   Diabetes mellitus [facility])     Patient Active Problem List   Diagnosis Date Noted   Allergic rhinitis 07/07/2021   Encounter for general adult medical examination with abnormal findings 07/07/2021   Constipation 07/07/2021   History of herpes genitalis 06/24/2021   Intractable nausea and vomiting 12/06/2019   DKA (diabetic ketoacidoses) 12/03/2019   SIRS (systemic inflammatory response syndrome) (HCC) 12/03/2019   Hyperkalemia 12/03/2019   Hyperglycemia    Intractable vomiting    Dysmenorrhea 02/20/2019   Marijuana use 49/67/5916   Cyclical vomiting 38/46/6599   Hypokalemia 02/20/2019   Hyperglycemia due to type 1 diabetes mellitus ([place]) 02/19/2019   Type 1 diabetes mellitus without complication ([place]) 35/70/1779    History reviewed. No pertinent surgical history.  OB History     Gravida  0   Para  0   Term  0   Preterm  0   AB  0   Living  0      SAB  0   IAB  0   Ectopic  0   Multiple  0   Live Births  0            Home Medications    Prior to Admission medications   Medication Sig Start Date End Date Taking? Authorizing Provider  ACCU-CHEK GUIDE test strip Use to check your blood sugar 4 times a day (before meals and at bedtime) 06/02/21   Ailene Ards, NP  Accu-Chek Softclix Lancets lancets SMARTSIG:Topical 1-4 Times Daily 01/25/21   [provider]  blood glucose meter kit and supplies Dispense based on patient and  insurance preference. Use up to four times daily as directed. (FOR ICD-10 E10.9, E11.9). 01/25/21   Isla Pence, MD  capsicum (ZOSTRIX) 0.075 % topical cream Apply 1 application. topically 2 (two) times daily. 08/07/21   Lynden Oxford Scales, PA-C  Continuous Blood Gluc Sensor (DEXCOM G6 SENSOR) MISC 1 Device by Does not apply route as directed. 07/26/21   Shamleffer, Melanie Crazier, MD  Continuous Blood Gluc Transmit (DEXCOM G6 TRANSMITTER) MISC 1 Device by Does not apply route as directed. 07/26/21   Shamleffer, Melanie Crazier, MD  fluticasone (FLONASE) 50 MCG/ACT nasal spray Place 2 sprays into both nostrils daily. 07/07/21   Ailene Ards, NP  insulin aspart (NOVOLOG) 100 UNIT/ML injection Max Daily 60 units per pump 12/05/21   Shamleffer, Melanie Crazier, MD  norgestimate-ethinyl estradiol (ORTHO-CYCLEN) 0.25-35 MG-MCG tablet Take 1 tablet by mouth daily. 06/24/21   Luvenia Redden, PA-C  ondansetron (ZOFRAN-ODT) 8 MG disintegrating tablet Take 1 tablet (8 mg total) by mouth every 8 (eight) hours as needed for nausea or vomiting. 08/07/21   Lynden Oxford Scales, PA-C  pantoprazole (PROTONIX) 40 MG tablet Take 1 tablet (40 mg total) by mouth daily. 12/07/19 01/06/20  Antonieta Pert, MD  potassium chloride 20 MEQ TBCR Take 20 mEq by mouth daily  for 7 doses. 12/07/19 12/14/19  Antonieta Pert, MD    Family History Family History  Problem Relation Age of Onset   Diabetes Paternal Grandfather    Heart attack Paternal Grandfather    Diabetes Paternal Grandmother    Cancer Maternal Grandmother        lung   Alcohol abuse Maternal Grandfather    Alcohol abuse Father    Drug abuse Father    High Cholesterol Father    Alcohol abuse Mother    Depression Mother    Drug abuse Mother     Social History Social History   Tobacco Use   Smoking status: Former    Types: Cigarettes   Smokeless tobacco: Never  Vaping Use   Vaping Use: Every day   Substances: Nicotine, THC  Substance Use Topics   Alcohol  use: Yes    Comment: socially   Drug use: Yes    Types: Marijuana     Allergies   Patient has no known allergies.   Review of Systems Review of Systems  Constitutional:  Negative for chills and fever.  HENT:  Negative for ear pain and sore throat.   Eyes:  Negative for pain and visual disturbance.  Respiratory:  Negative for cough and shortness of breath.   Cardiovascular:  Negative for chest pain and palpitations.  Gastrointestinal:  Negative for abdominal pain and vomiting.  Genitourinary:  Negative for dysuria and hematuria.  Musculoskeletal:  Positive for arthralgias (left ankle pain). Negative for back pain.  Skin:  Negative for color change and rash.  Neurological:  Negative for seizures and syncope.  All other systems reviewed and are negative.    Physical Exam Triage Vital Signs ED Triage Vitals  Enc Vitals Group     BP 12/10/21 1055 136/79     Pulse Rate 12/10/21 1055 82     Resp 12/10/21 1055 18     Temp 12/10/21 1055 98.9 F (37.2 C)     Temp Source 12/10/21 1055 Oral     SpO2 12/10/21 1055 97 %     Weight --      Height --      Head Circumference --      Peak Flow --      Pain Score 12/10/21 1054 7     Pain Loc --      Pain Edu? --      Excl. in [place]? --    No data found.  Updated Vital Signs BP 136/79 (BP Location: Left Arm)   Pulse 82   Temp 98.9 F (37.2 C) (Oral)   Resp 18   LMP 11/11/2021   SpO2 97%   Visual Acuity Right Eye Distance:   Left Eye Distance:   Bilateral Distance:    Right Eye Near:   Left Eye Near:    Bilateral Near:     Physical Exam Vitals and nursing note reviewed.  Constitutional:      General: She is not in acute distress.    Appearance: She is well-developed.  HENT:     Head: Normocephalic and atraumatic.  Eyes:     Conjunctiva/sclera: Conjunctivae normal.  Cardiovascular:     Rate and Rhythm: Normal rate and regular rhythm.     Heart sounds: No murmur heard. Pulmonary:     Effort: Pulmonary effort is  normal. No respiratory distress.     Breath sounds: Normal breath sounds.  Abdominal:     Palpations: Abdomen is soft.     Tenderness:  There is no abdominal tenderness.  Musculoskeletal:        General: No swelling.     Cervical back: Neck supple.     Left ankle: Swelling present. Tenderness present over the ATF ligament.  Skin:    General: Skin is warm and dry.     Capillary Refill: Capillary refill takes less than 2 seconds.  Neurological:     Mental Status: She is alert.  Psychiatric:        Mood and Affect: Mood normal.      UC Treatments / Results  Labs (all labs ordered are listed, but only abnormal results are displayed) Labs Reviewed - No data to display  EKG   Radiology DG Foot Complete Left  Result Date: 12/10/2021 CLINICAL DATA:  Left foot injury EXAM: LEFT FOOT - COMPLETE 3+ VIEW COMPARISON:  None Available. FINDINGS: There is no evidence of fracture or dislocation. There is no evidence of arthropathy or other focal bone abnormality. Soft tissues are unremarkable. IMPRESSION: Negative. Electronically Signed   By: Kathreen Devoid M.D.   On: 12/10/2021 11:11    Procedures Procedures (including critical care time)  Medications Ordered in UC Medications - No data to display  Initial Impression / Assessment and Plan / UC Course  I have reviewed the triage vital signs and the nursing notes.  Pertinent labs & imaging results that were available during my care of the patient were reviewed by me and considered in my medical decision making (see chart for details).     Left ankle sprain, no fracture noted on xray.  Lace up ankle wrap given.  Supportive care discussed.  If no improvement advised follow up with ortho.  Final Clinical Impressions(s) / UC Diagnoses   Final diagnoses:  Sprain of left ankle, unspecified ligament, initial encounter     Discharge Instructions      Recommend ice, rest, elevation Can take NSAIDS as needed Activity as tolerated.   If  no improvement follow up with orthopedics    ED Prescriptions   None    PDMP not reviewed this encounter.   Ward, Lenise Arena, PA-C 12/10/21 1126

## 2021-12-10 NOTE — ED Triage Notes (Signed)
Pt presents with left foot injury after stepping into a hole last night with heels on.

## 2021-12-28 ENCOUNTER — Encounter: Payer: Federal, State, Local not specified - PPO | Attending: Internal Medicine | Admitting: Nutrition

## 2021-12-28 DIAGNOSIS — E109 Type 1 diabetes mellitus without complications: Secondary | ICD-10-CM | POA: Insufficient documentation

## 2021-12-29 ENCOUNTER — Telehealth: Payer: Self-pay | Admitting: Nutrition

## 2021-12-29 NOTE — Telephone Encounter (Signed)
Phone call went straight to voice mail.  LVM to call me to let me know how she did last night, and if she has any questions for me.

## 2021-12-29 NOTE — Progress Notes (Signed)
Patient was trained on the use of the Tandem insulin pump.  We discussed how this pump delivers insulin and settings were put into the pump by the patient per Dr. Harvel Ricks orders:  Basal rate:  0.7u/hr, ISF: 45, I/C: 1 because patient not counting carbs.--Says gives 10u per meal, and that blood sugars do not drop low for this.  Warned patient that on a pump you require less insulin, and patient reported good understanding of this.)  Target 120, with max bolus set to 15, and max basal rate set to 2.4u/hr.    Discussed need to learn carb counting for a more accurate bolus amount and the ability to cover snacks better.  She agreed to learn this and return to see me or the dietitian at a later time to learn this.   She filled a cartridge with Novolog insulin and attached a 42mm infusion set to her left abdomen without difficulty.  She re demonstrated how to give a bolus, and correction dose x2 correctly and had no questions about how to do this.  She was told to keep this app running to allow for blood sugars and pump information to stream to the cloud and for [place] to review this if needed.  She agreed to do this. Pump was put in control IQ and the pump was linked to her phone and to T-connect mobil app We reviewed all other topics on the checklist and handouts given on all pump functions:  how to change cartridge, infusion sets, start/stop pump/ control IQ symbols, bolusing from phone, high blood sugar protocol, and sick day guidelines.  Patient was strongly encouraged to review all of these and to call if questions.  She agreed to do this, and make a return appointment in 1 week to review all topics.   She had no final questions.

## 2021-12-29 NOTE — Patient Instructions (Signed)
Read over handouts given and pump manuel Bolus for all meals and snacks eaten, unless treating low blood sugars Call Tandem help line if questions. Call office if blood sugar drop low or if blood sugar remain over 225.

## 2022-01-04 ENCOUNTER — Ambulatory Visit: Payer: Federal, State, Local not specified - PPO | Admitting: Nutrition

## 2022-01-12 ENCOUNTER — Ambulatory Visit (INDEPENDENT_AMBULATORY_CARE_PROVIDER_SITE_OTHER): Payer: Federal, State, Local not specified - PPO | Admitting: Nurse Practitioner

## 2022-01-12 VITALS — BP 120/78 | HR 85 | Temp 98.4°F | Ht 60.0 in | Wt 151.0 lb

## 2022-01-12 DIAGNOSIS — Z23 Encounter for immunization: Secondary | ICD-10-CM | POA: Insufficient documentation

## 2022-01-12 DIAGNOSIS — R0789 Other chest pain: Secondary | ICD-10-CM | POA: Diagnosis not present

## 2022-01-12 DIAGNOSIS — Z0120 Encounter for dental examination and cleaning without abnormal findings: Secondary | ICD-10-CM | POA: Insufficient documentation

## 2022-01-12 DIAGNOSIS — E109 Type 1 diabetes mellitus without complications: Secondary | ICD-10-CM

## 2022-01-12 NOTE — Assessment & Plan Note (Signed)
Referral to dentistry made today.

## 2022-01-12 NOTE — Assessment & Plan Note (Signed)
Chronic, improving.  Patient courage to follow-up with endocrinology as scheduled.  Foot exam completed today she was also encouraged to follow-up with eye doctor as scheduled as well.  Prevnar 20 administered today, VIS provided as well.

## 2022-01-12 NOTE — Assessment & Plan Note (Signed)
Sounds muscular in origin, is improving.  Recommend over-the-counter treatment with Tylenol, ibuprofen as needed as well as use of ice and/or heat.  Patient reports understanding.  Offered muscle relaxer however patient declined this at this time as pain is getting better.

## 2022-01-12 NOTE — Assessment & Plan Note (Signed)
Prevnar 20 administered, VIS provided as well.

## 2022-01-12 NOTE — Progress Notes (Signed)
Established Patient Office Visit  Subjective   Patient ID: Judy Cook, female    DOB: 12-Nov-2000  Age: 21 y.o. MRN: [id number]  Chief Complaint  Patient presents with   chest wall pain    Patient arrives today for the above.  Chest wall pain: Has been occurring for last 2 weeks, only known trigger was that she was coughing pretty frequently over the last couple weeks.  Cough has improved however does occur when she smokes.  Reports pain is 4/10.  Located on the left lower chest wall.  Per patient is improving.  Type 1 diabetes: She is following with endocrinology, and is now on insulin pump.  A1c is improving (last A1c 8.7, improvement from 10.7).  She is due for foot exam today.  She reports having diabetic eye exam scheduled for next month.  She does not want flu shot administered but she would agree to pneumonia vaccine today.  She is requesting referral to dentist for routine teeth cleaning.    Review of Systems  Respiratory:  Negative for shortness of breath.   Cardiovascular:  Negative for chest pain.      Objective:     BP 120/78 (BP Location: Right Arm, Patient Position: Sitting, Cuff Size: Normal)   Pulse 85   Temp 98.4 F (36.9 C) (Oral)   Ht 5' (1.524 m)   Wt 151 lb (68.5 kg)   SpO2 98%   BMI 29.49 kg/m  BP Readings from Last 3 Encounters:  01/12/22 120/78  12/10/21 136/79  12/05/21 126/84   Wt Readings from Last 3 Encounters:  01/12/22 151 lb (68.5 kg)  12/05/21 151 lb 9.6 oz (68.8 kg)  07/26/21 124 lb (56.2 kg)      Physical Exam Vitals reviewed.  Constitutional:      General: She is not in acute distress.    Appearance: Normal appearance.  HENT:     Head: Normocephalic and atraumatic.  Neck:     Vascular: No carotid bruit.  Cardiovascular:     Rate and Rhythm: Normal rate and regular rhythm.     Pulses: Normal pulses.          Dorsalis pedis pulses are 2+ on the right side and 2+ on the left side.     Heart sounds: Normal  heart sounds.  Pulmonary:     Effort: Pulmonary effort is normal.     Breath sounds: Normal breath sounds.  Musculoskeletal:     Right foot: No deformity.     Left foot: No deformity.  Feet:     Right foot:     Protective Sensation: 10 sites tested.  10 sites sensed.     Skin integrity: Skin integrity normal.     Toenail Condition: Right toenails are normal.     Left foot:     Protective Sensation: 10 sites tested.  10 sites sensed.     Skin integrity: Skin integrity normal.     Toenail Condition: Left toenails are normal.  Skin:    General: Skin is warm and dry.  Neurological:     General: No focal deficit present.     Mental Status: She is alert and oriented to person, place, and time.  Psychiatric:        Mood and Affect: Mood normal.        Behavior: Behavior normal.        Judgment: Judgment normal.      No results found for any visits on 01/12/22.  The ASCVD Risk score (Arnett DK, et al., 2019) failed to calculate for the following reasons:   The 2019 ASCVD risk score is only valid for ages 57 to 16    Assessment & Plan:   Problem List Items Addressed This Visit       Endocrine   Type 1 diabetes mellitus without complication ([place]) - Primary    Chronic, improving.  Patient courage to follow-up with endocrinology as scheduled.  Foot exam completed today she was also encouraged to follow-up with eye doctor as scheduled as well.  Prevnar 20 administered today, VIS provided as well.        Other   Encounter for dental exam and cleaning w/o abnormal findings    Referral to dentistry made today.      Relevant Orders   Ambulatory referral to Dentistry   Need for vaccination    Prevnar 20 administered, VIS provided as well.      Relevant Orders   Pneumococcal conjugate vaccine 20-valent (Prevnar 20) (Completed)   Chest wall pain    Sounds muscular in origin, is improving.  Recommend over-the-counter treatment with Tylenol, ibuprofen as needed as well as  use of ice and/or heat.  Patient reports understanding.  Offered muscle relaxer however patient declined this at this time as pain is getting better.       Return for CPE with Judson Roch anytime after 07/08/22.    Ailene Ards, NP

## 2022-01-18 DIAGNOSIS — E1065 Type 1 diabetes mellitus with hyperglycemia: Secondary | ICD-10-CM | POA: Diagnosis not present

## 2022-02-01 ENCOUNTER — Ambulatory Visit (HOSPITAL_COMMUNITY)
Admission: EM | Admit: 2022-02-01 | Discharge: 2022-02-01 | Disposition: A | Discharge status: Home / Self Care | Payer: Federal, State, Local not specified - PPO | Attending: Physician Assistant | Admitting: Physician Assistant

## 2022-02-01 ENCOUNTER — Encounter (HOSPITAL_COMMUNITY): Payer: Self-pay

## 2022-02-01 DIAGNOSIS — N764 Abscess of vulva: Secondary | ICD-10-CM | POA: Diagnosis not present

## 2022-02-01 DIAGNOSIS — E10628 Type 1 diabetes mellitus with other skin complications: Secondary | ICD-10-CM | POA: Diagnosis not present

## 2022-02-01 LAB — POCT URINALYSIS DIPSTICK, ED / UC
Bilirubin Urine: NEGATIVE
Glucose, UA: NEGATIVE mg/dL
Hgb urine dipstick: NEGATIVE
Nitrite: NEGATIVE
Protein, ur: 30 mg/dL — AB
Specific Gravity, Urine: 1.015 (ref 1.005–1.030)
Urobilinogen, UA: 1 mg/dL (ref 0.0–1.0)
pH: 7.5 (ref 5.0–8.0)

## 2022-02-01 LAB — CBG MONITORING, ED: Glucose-Capillary: 167 mg/dL — ABNORMAL HIGH (ref 70–99)

## 2022-02-01 MED ORDER — SULFAMETHOXAZOLE-TRIMETHOPRIM 800-160 MG PO TABS: 1.0000 | ORAL_TABLET | Freq: Two times a day (BID) | ORAL | 0 refills | Status: AC

## 2022-02-01 NOTE — Discharge Instructions (Signed)
You did have trace ketones in your urine.  Please use ketone strips available over-the-counter to monitor this.  Make sure you are drinking plenty of fluid and eating small frequent meals.  If you have any worsening symptoms including very high blood sugar, nausea/vomiting interfere with oral intake, weakness, shortness of breath you need to go to the emergency room.  We are treating you for an abscess.  Take Bactrim DS twice daily.  Use warm compresses/sitz bath to help with your symptoms.  Please follow-up with OB/GYN as soon as possible.  Call them to schedule an appointment.  If anything worsens return for reevaluation.

## 2022-02-01 NOTE — ED Triage Notes (Signed)
Patient vomiting since last night. No new foods or medications. Patient states she has been drinking alcohol. Feeling some brain fog and lightheaded. Patient has been able to keep liquids down.  Abscess located on the right inner thigh/grain area 2-3 weeks. States it did drain a bit and is now hard. States she has a history of boils.

## 2022-02-01 NOTE — ED Provider Notes (Signed)
[place]    CSN: [id number] Arrival date & time: 02/01/22  0623      History   Chief Complaint Chief Complaint  Patient presents with   Emesis   Abscess    HPI Judy Cook is a 21 y.o. female.   Patient presents today with several week history of enlarging lesion of her right labia majora.  Reports that this was draining but has since stopped draining.  She reports that pain is rated 6 on a 0-10 pain scale, described as aching, no aggravating leaving factors identified.  She denies any recent antibiotics.  Has not tried any over-the-counter medication for symptom management.  She does report an episode of nausea and vomiting yesterday but is unsure if this is related to something else or the infection.  She reports her last episode of emesis was at 3 AM today and she has been able to eat and drink normally.  She does have a history of type 1 diabetes and uses an insulin pump.  Reports that she has been compliant with her medications and diet.  She denies any fever, chest pain, shortness of breath, nausea, vomiting.  Does report a history of recurrent skin infections and abscesses but denies formal diagnosis of hidradenitis suppurativa.  Denies history of MRSA.    Past Medical History:  Diagnosis Date   Diabetes mellitus [facility])     Patient Active Problem List   Diagnosis Date Noted   Encounter for dental exam and cleaning w/o abnormal findings 01/12/2022   Need for vaccination 01/12/2022   Chest wall pain 01/12/2022   Allergic rhinitis 07/07/2021   Encounter for general adult medical examination with abnormal findings 07/07/2021   Constipation 07/07/2021   History of herpes genitalis 06/24/2021   Intractable nausea and vomiting 12/06/2019   DKA (diabetic ketoacidoses) 12/03/2019   SIRS (systemic inflammatory response syndrome) ([place]) 12/03/2019   Hyperkalemia 12/03/2019   Hyperglycemia    Intractable vomiting    Dysmenorrhea 02/20/2019   Marijuana  use 76/28/3151   Cyclical vomiting 76/16/0737   Hypokalemia 02/20/2019   Hyperglycemia due to type 1 diabetes mellitus ([place]) 02/19/2019   Type 1 diabetes mellitus without complication ([place]) 10/62/6948    History reviewed. No pertinent surgical history.  OB History     Gravida  0   Para  0   Term  0   Preterm  0   AB  0   Living  0      SAB  0   IAB  0   Ectopic  0   Multiple  0   Live Births  0            Home Medications    Prior to Admission medications   Medication Sig Start Date End Date Taking? Authorizing Provider  insulin aspart (NOVOLOG) 100 UNIT/ML injection Max Daily 60 units per pump 12/05/21  Yes Shamleffer, Melanie Crazier, MD  sulfamethoxazole-trimethoprim (BACTRIM DS) 800-160 MG tablet Take 1 tablet by mouth 2 (two) times daily for 7 days. 02/01/22 02/08/22 Yes Xara Paulding, Derry Skill, PA-C  ACCU-CHEK GUIDE test strip Use to check your blood sugar 4 times a day (before meals and at bedtime) 06/02/21   Ailene Ards, NP  Accu-Chek Softclix Lancets lancets SMARTSIG:Topical 1-4 Times Daily 01/25/21   [provider]  blood glucose meter kit and supplies Dispense based on patient and insurance preference. Use up to four times daily as directed. (FOR ICD-10 E10.9, E11.9). 01/25/21   Isla Pence, MD  capsicum (ZOSTRIX) 0.075 % topical cream Apply 1 application. topically 2 (two) times daily. 08/07/21   Lynden Oxford Scales, PA-C  Continuous Blood Gluc Sensor (DEXCOM G6 SENSOR) MISC 1 Device by Does not apply route as directed. 07/26/21   Shamleffer, Melanie Crazier, MD  Continuous Blood Gluc Transmit (DEXCOM G6 TRANSMITTER) MISC 1 Device by Does not apply route as directed. 07/26/21   Shamleffer, Melanie Crazier, MD  fluticasone (FLONASE) 50 MCG/ACT nasal spray Place 2 sprays into both nostrils daily. 07/07/21   Ailene Ards, NP  norgestimate-ethinyl estradiol (ORTHO-CYCLEN) 0.25-35 MG-MCG tablet Take 1 tablet by mouth daily. 06/24/21   Luvenia Redden, PA-C   ondansetron (ZOFRAN-ODT) 8 MG disintegrating tablet Take 1 tablet (8 mg total) by mouth every 8 (eight) hours as needed for nausea or vomiting. 08/07/21   Lynden Oxford Scales, PA-C  pantoprazole (PROTONIX) 40 MG tablet Take 1 tablet (40 mg total) by mouth daily. 12/07/19 01/06/20  Antonieta Pert, MD  potassium chloride 20 MEQ TBCR Take 20 mEq by mouth daily for 7 doses. 12/07/19 12/14/19  Antonieta Pert, MD    Family History Family History  Problem Relation Age of Onset   Diabetes Paternal Grandfather    Heart attack Paternal Grandfather    Diabetes Paternal Grandmother    Cancer Maternal Grandmother        lung   Alcohol abuse Maternal Grandfather    Alcohol abuse Father    Drug abuse Father    High Cholesterol Father    Alcohol abuse Mother    Depression Mother    Drug abuse Mother     Social History Social History   Tobacco Use   Smoking status: Former    Types: Cigarettes   Smokeless tobacco: Never  Vaping Use   Vaping Use: Every day   Substances: Nicotine, THC  Substance Use Topics   Alcohol use: Yes    Comment: socially   Drug use: Yes    Types: Marijuana     Allergies   Patient has no known allergies.   Review of Systems Review of Systems  Constitutional:  Positive for activity change. Negative for appetite change, fatigue and fever.  Respiratory:  Negative for cough and shortness of breath.   Cardiovascular:  Negative for chest pain.  Gastrointestinal:  Negative for abdominal pain, diarrhea, nausea (Resolved) and vomiting (Resolved).  Skin:  Positive for wound.     Physical Exam Triage Vital Signs ED Triage Vitals  Enc Vitals Group     BP 02/01/22 0942 119/85     Pulse Rate 02/01/22 0942 69     Resp 02/01/22 0942 16     Temp 02/01/22 0942 98.1 F (36.7 C)     Temp Source 02/01/22 0942 Oral     SpO2 02/01/22 0942 99 %     Weight --      Height --      Head Circumference --      Peak Flow --      Pain Score 02/01/22 0945 0     Pain Loc --      Pain  Edu? --      Excl. in [place]? --    No data found.  Updated Vital Signs BP 119/85 (BP Location: Left Arm)   Pulse 69   Temp 98.1 F (36.7 C) (Oral)   Resp 16   LMP 01/20/2022 (Exact Date)   SpO2 99%   Visual Acuity Right Eye Distance:   Left Eye Distance:   Bilateral  Distance:    Right Eye Near:   Left Eye Near:    Bilateral Near:     Physical Exam Vitals reviewed.  Constitutional:      General: She is awake. She is not in acute distress.    Appearance: Normal appearance. She is well-developed. She is not ill-appearing.     Comments: Very pleasant female appears stated age in no acute distress sitting comfortably in exam room  HENT:     Head: Normocephalic and atraumatic.  Cardiovascular:     Rate and Rhythm: Normal rate and regular rhythm.     Heart sounds: Normal heart sounds, S1 normal and S2 normal. No murmur heard. Pulmonary:     Effort: Pulmonary effort is normal.     Breath sounds: Normal breath sounds. No wheezing, rhonchi or rales.     Comments: Clear auscultation bilaterally Abdominal:     General: Bowel sounds are normal.     Palpations: Abdomen is soft.     Tenderness: There is no abdominal tenderness. There is no right CVA tenderness, left CVA tenderness, guarding or rebound.  Genitourinary:    Labia:        Right: Lesion present.        Left: No lesion.        Comments: 3 cm x 1 cm indurated lesion noted right labia majora without central fluctuance.  No streaking or evidence of lymphangitis.  No active bleeding or drainage noted. Psychiatric:        Behavior: Behavior is cooperative.      UC Treatments / Results  Labs (all labs ordered are listed, but only abnormal results are displayed) Labs Reviewed  POCT URINALYSIS DIPSTICK, ED / UC - Abnormal; Notable for the following components:      Result Value   Ketones, ur TRACE (*)    Protein, ur 30 (*)    Leukocytes,Ua TRACE (*)    All other components within normal limits  CBG MONITORING, ED -  Abnormal; Notable for the following components:   Glucose-Capillary 167 (*)    All other components within normal limits    EKG   Radiology No results found.  Procedures Procedures (including critical care time)  Medications Ordered in UC Medications - No data to display  Initial Impression / Assessment and Plan / UC Course  I have reviewed the triage vital signs and the nursing notes.  Pertinent labs & imaging results that were available during my care of the patient were reviewed by me and considered in my medical decision making (see chart for details).     Patient is well-appearing, afebrile, nontoxic, nontachycardic.  Blood sugar was appropriate today and she had only trace ketones in her urine.  No concern for DKA given her clinical presentation and she has had resolution of nausea/vomiting symptoms.  Discussed that she should monitor for ketones in her urine with over-the-counter ketone strips and continue her insulin as prescribed.  Discussed that if she has any worsening symptoms she needs to go to the emergency room including recurrent nausea/vomiting, weakness, shortness of breath.  Vulvar abscess noted on exam.  Discussed potential utility of I&D, however, patient declined attempting this procedure in clinic today.  She preferred a trial of antibiotics.  Bactrim DS sent to pharmacy.  Discussed that if she has any oral lesions or rash she should stop the medication and be seen immediately.  Recommended warm compresses and sitz bath for additional symptom relief.  She is to follow-up quickly with  OB/GYN and was given contact information for local provider with instruction to call to schedule an appointment.  Discussed that if she has any worsening symptoms including enlarging lesion, increased pain, fever, nausea, vomiting she needs to be seen immediately.  Strict return precautions given.  Work excuse note provided.  Final Clinical Impressions(s) / UC Diagnoses   Final  diagnoses:  Vulvar abscess  Type 1 diabetes mellitus with other skin complication [facility])     Discharge Instructions      You did have trace ketones in your urine.  Please use ketone strips available over-the-counter to monitor this.  Make sure you are drinking plenty of fluid and eating small frequent meals.  If you have any worsening symptoms including very high blood sugar, nausea/vomiting interfere with oral intake, weakness, shortness of breath you need to go to the emergency room.  We are treating you for an abscess.  Take Bactrim DS twice daily.  Use warm compresses/sitz bath to help with your symptoms.  Please follow-up with OB/GYN as soon as possible.  Call them to schedule an appointment.  If anything worsens return for reevaluation.     ED Prescriptions     Medication Sig Dispense Auth. Provider   sulfamethoxazole-trimethoprim (BACTRIM DS) 800-160 MG tablet Take 1 tablet by mouth 2 (two) times daily for 7 days. 14 tablet Jordi Kamm, Derry Skill, PA-C      PDMP not reviewed this encounter.   Terrilee Croak, PA-C 02/01/22 1035

## 2022-02-06 ENCOUNTER — Ambulatory Visit (HOSPITAL_COMMUNITY)
Admission: RE | Admit: 2022-02-06 | Discharge: 2022-02-06 | Disposition: A | Discharge status: Home / Self Care | Payer: Federal, State, Local not specified - PPO | Source: Ambulatory Visit | Attending: Emergency Medicine | Admitting: Emergency Medicine

## 2022-02-06 ENCOUNTER — Encounter (HOSPITAL_COMMUNITY): Payer: Self-pay

## 2022-02-06 VITALS — BP 142/86 | HR 93 | Temp 98.7°F | Resp 17

## 2022-02-06 DIAGNOSIS — B349 Viral infection, unspecified: Secondary | ICD-10-CM | POA: Diagnosis not present

## 2022-02-06 DIAGNOSIS — L02214 Cutaneous abscess of groin: Secondary | ICD-10-CM | POA: Diagnosis not present

## 2022-02-06 DIAGNOSIS — Z1152 Encounter for screening for COVID-19: Secondary | ICD-10-CM | POA: Diagnosis not present

## 2022-02-06 DIAGNOSIS — E109 Type 1 diabetes mellitus without complications: Secondary | ICD-10-CM | POA: Diagnosis not present

## 2022-02-06 DIAGNOSIS — Z794 Long term (current) use of insulin: Secondary | ICD-10-CM | POA: Diagnosis not present

## 2022-02-06 DIAGNOSIS — K529 Noninfective gastroenteritis and colitis, unspecified: Secondary | ICD-10-CM | POA: Diagnosis not present

## 2022-02-06 LAB — RESP PANEL BY RT-PCR (FLU A&B, COVID) ARPGX2
Influenza A by PCR: NEGATIVE
Influenza B by PCR: NEGATIVE
SARS Coronavirus 2 by RT PCR: NEGATIVE

## 2022-02-06 MED ORDER — ONDANSETRON HCL 4 MG PO TABS: 4.0000 mg | ORAL_TABLET | Freq: Four times a day (QID) | ORAL | 0 refills | Status: DC | PRN

## 2022-02-06 NOTE — ED Triage Notes (Signed)
Pt reports having Fatigue, chills, vomiting, for about 3 days.  Reports currently taking antibiotics for an abscess on groin area.

## 2022-02-06 NOTE — Discharge Instructions (Addendum)
We will call you if your covid/flu test returns positive.   Increase your fluid/water intake as much as tolerated.  Continue symptomatic care at home. I recommend mucinex twice daily for congestion relief. You can take the zofran 30 minutes before eating to help settle the stomach.

## 2022-02-06 NOTE — ED Provider Notes (Signed)
[place]    CSN: [id number] Arrival date & time: 02/06/22  0947      History   Chief Complaint Chief Complaint  Patient presents with   appt 1000   Fatigue    HPI Judy Cook is a 21 y.o. female.  Presents with nausea, stomach upset Vomited twice yesterday Developed nasal congestion and fatigue about 3 days ago No fevers Has tried peppermint tea, aleve Tolerates fluids and foods by mouth  She is on bactrim for groin abscess, on day 5 with two days to go  T1 diabetes on insulin, well controlled, follows with endocrine  Would like covid/flu test  Past Medical History:  Diagnosis Date   Diabetes mellitus [facility])     Patient Active Problem List   Diagnosis Date Noted   Encounter for dental exam and cleaning w/o abnormal findings 01/12/2022   Need for vaccination 01/12/2022   Chest wall pain 01/12/2022   Allergic rhinitis 07/07/2021   Encounter for general adult medical examination with abnormal findings 07/07/2021   Constipation 07/07/2021   History of herpes genitalis 06/24/2021   Intractable nausea and vomiting 12/06/2019   DKA (diabetic ketoacidoses) 12/03/2019   SIRS (systemic inflammatory response syndrome) ([place]) 12/03/2019   Hyperkalemia 12/03/2019   Hyperglycemia    Intractable vomiting    Dysmenorrhea 02/20/2019   Marijuana use 65/46/5035   Cyclical vomiting 46/56/8127   Hypokalemia 02/20/2019   Hyperglycemia due to type 1 diabetes mellitus ([place]) 02/19/2019   Type 1 diabetes mellitus without complication ([place]) 51/70/0174    History reviewed. No pertinent surgical history.  OB History     Gravida  0   Para  0   Term  0   Preterm  0   AB  0   Living  0      SAB  0   IAB  0   Ectopic  0   Multiple  0   Live Births  0            Home Medications    Prior to Admission medications   Medication Sig Start Date End Date Taking? Authorizing Provider  ondansetron (ZOFRAN) 4 MG tablet Take 1 tablet (4 mg  total) by mouth every 6 (six) hours as needed for nausea or vomiting. 02/06/22  Yes Kleigh Hoelzer, Wells Guiles, PA-C  ACCU-CHEK GUIDE test strip Use to check your blood sugar 4 times a day (before meals and at bedtime) 06/02/21   Ailene Ards, NP  Accu-Chek Softclix Lancets lancets SMARTSIG:Topical 1-4 Times Daily 01/25/21   [provider]  blood glucose meter kit and supplies Dispense based on patient and insurance preference. Use up to four times daily as directed. (FOR ICD-10 E10.9, E11.9). 01/25/21   Isla Pence, MD  capsicum (ZOSTRIX) 0.075 % topical cream Apply 1 application. topically 2 (two) times daily. 08/07/21   Lynden Oxford Scales, PA-C  Continuous Blood Gluc Sensor (DEXCOM G6 SENSOR) MISC 1 Device by Does not apply route as directed. 07/26/21   Shamleffer, Melanie Crazier, MD  Continuous Blood Gluc Transmit (DEXCOM G6 TRANSMITTER) MISC 1 Device by Does not apply route as directed. 07/26/21   Shamleffer, Melanie Crazier, MD  fluticasone (FLONASE) 50 MCG/ACT nasal spray Place 2 sprays into both nostrils daily. 07/07/21   Ailene Ards, NP  insulin aspart (NOVOLOG) 100 UNIT/ML injection Max Daily 60 units per pump 12/05/21   Shamleffer, Melanie Crazier, MD  norgestimate-ethinyl estradiol (ORTHO-CYCLEN) 0.25-35 MG-MCG tablet Take 1 tablet by mouth daily. 06/24/21  Luvenia Redden, PA-C  pantoprazole (PROTONIX) 40 MG tablet Take 1 tablet (40 mg total) by mouth daily. 12/07/19 01/06/20  Antonieta Pert, MD  potassium chloride 20 MEQ TBCR Take 20 mEq by mouth daily for 7 doses. 12/07/19 12/14/19  Antonieta Pert, MD  sulfamethoxazole-trimethoprim (BACTRIM DS) 800-160 MG tablet Take 1 tablet by mouth 2 (two) times daily for 7 days. 02/01/22 02/08/22  Raspet, Derry Skill, PA-C    Family History Family History  Problem Relation Age of Onset   Diabetes Paternal Grandfather    Heart attack Paternal Grandfather    Diabetes Paternal Grandmother    Cancer Maternal Grandmother        lung   Alcohol abuse Maternal  Grandfather    Alcohol abuse Father    Drug abuse Father    High Cholesterol Father    Alcohol abuse Mother    Depression Mother    Drug abuse Mother     Social History Social History   Tobacco Use   Smoking status: Former    Types: Cigarettes   Smokeless tobacco: Never  Vaping Use   Vaping Use: Every day   Substances: Nicotine, THC  Substance Use Topics   Alcohol use: Yes    Comment: socially   Drug use: Yes    Types: Marijuana     Allergies   Patient has no known allergies.   Review of Systems Review of Systems Per HPI  Physical Exam Triage Vital Signs ED Triage Vitals  Enc Vitals Group     BP 02/06/22 1013 (!) 142/86     Pulse Rate 02/06/22 1013 93     Resp 02/06/22 1013 17     Temp 02/06/22 1013 98.7 F (37.1 C)     Temp src --      SpO2 02/06/22 1013 97 %     Weight --      Height --      Head Circumference --      Peak Flow --      Pain Score 02/06/22 1012 0     Pain Loc --      Pain Edu? --      Excl. in [place]? --    No data found.  Updated Vital Signs BP (!) 142/86 (BP Location: Right Arm)   Pulse 93   Temp 98.7 F (37.1 C)   Resp 17   LMP 01/20/2022 (Exact Date)   SpO2 97%    Physical Exam Vitals and nursing note reviewed.  Constitutional:      General: She is not in acute distress.    Comments: Well appearing, alert and oriented   HENT:     Nose: Congestion present.     Right Turbinates: Enlarged.     Left Turbinates: Enlarged.     Mouth/Throat:     Mouth: Mucous membranes are moist.     Pharynx: Oropharynx is clear. Uvula midline. No posterior oropharyngeal erythema.     Tonsils: No tonsillar exudate or tonsillar abscesses.  Eyes:     Conjunctiva/sclera: Conjunctivae normal.  Cardiovascular:     Rate and Rhythm: Normal rate and regular rhythm.     Heart sounds: Normal heart sounds.  Pulmonary:     Effort: Pulmonary effort is normal.     Breath sounds: Normal breath sounds.  Abdominal:     Palpations: Abdomen is soft.      Tenderness: There is no abdominal tenderness. There is no guarding or rebound.  Musculoskeletal:     Cervical  back: Normal range of motion.  Lymphadenopathy:     Cervical: No cervical adenopathy.  Skin:    General: Skin is warm and dry.  Neurological:     Mental Status: She is alert and oriented to person, place, and time.     UC Treatments / Results  Labs (all labs ordered are listed, but only abnormal results are displayed) Labs Reviewed  RESP PANEL BY RT-PCR (FLU A&B, COVID) [id number]    EKG   Radiology No results found.  Procedures Procedures (including critical care time)  Medications Ordered in UC Medications - No data to display  Initial Impression / Assessment and Plan / UC Course  I have reviewed the triage vital signs and the nursing notes.  Pertinent labs & imaging results that were available during my care of the patient were reviewed by me and considered in my medical decision making (see chart for details).  Likely viral etiology of symptoms Covid/flu pending per patient request. Out of window for antivirals. She has been nauseous over the last 5 days, been taking Bactrim since onset of nausea. Likely URI, could be stomach virus with it as well. Antibiotic is probably contributing. Discussed symptomatic care with fluids, mucinex for congestion, zofran for nausea. Sent zofran 4 mg PRN to pharmacy. Discussed taking before abx, take abx with food. She only has 3 more doses so hopefully symptoms will improve after that.  Tolerating fluids which is reassuring No concerns for DKA or other endocrine abnormality at this time Return precautions discussed. Patient agrees to plan  Final Clinical Impressions(s) / UC Diagnoses   Final diagnoses:  Viral illness  Gastroenteritis     Discharge Instructions      We will call you if your covid/flu test returns positive.   Increase your fluid/water intake as much as tolerated.  Continue symptomatic care at home. I  recommend mucinex twice daily for congestion relief. You can take the zofran 30 minutes before eating to help settle the stomach.    ED Prescriptions     Medication Sig Dispense Auth. Provider   ondansetron (ZOFRAN) 4 MG tablet Take 1 tablet (4 mg total) by mouth every 6 (six) hours as needed for nausea or vomiting. 12 tablet Leroy Pettway, Wells Guiles, PA-C      PDMP not reviewed this encounter.   Liddy Deam, Wells Guiles, [place] 02/06/22 1041

## 2022-02-24 ENCOUNTER — Telehealth: Payer: Self-pay | Admitting: Dietician

## 2022-02-24 NOTE — Telephone Encounter (Signed)
Called and left a message as patient was not available. She had left a vial of insulin in the office.  Called to inform patient to pick this up by 03/08/2022 or this will be discarded.  Left my number for any questions.  Antonieta Iba, RD, LDN, CDCES

## 2022-03-31 DIAGNOSIS — E1065 Type 1 diabetes mellitus with hyperglycemia: Secondary | ICD-10-CM | POA: Diagnosis not present

## 2022-04-12 ENCOUNTER — Other Ambulatory Visit (HOSPITAL_COMMUNITY)
Admission: RE | Admit: 2022-04-12 | Discharge: 2022-04-12 | Disposition: A | Discharge status: Home / Self Care | Payer: Federal, State, Local not specified - PPO | Source: Ambulatory Visit | Attending: Obstetrics & Gynecology | Admitting: Obstetrics & Gynecology

## 2022-04-12 ENCOUNTER — Ambulatory Visit (INDEPENDENT_AMBULATORY_CARE_PROVIDER_SITE_OTHER): Payer: Federal, State, Local not specified - PPO

## 2022-04-12 DIAGNOSIS — R350 Frequency of micturition: Secondary | ICD-10-CM | POA: Diagnosis not present

## 2022-04-12 DIAGNOSIS — N898 Other specified noninflammatory disorders of vagina: Secondary | ICD-10-CM | POA: Insufficient documentation

## 2022-04-12 LAB — POCT URINALYSIS DIPSTICK
Bilirubin, UA: NEGATIVE
Glucose, UA: NEGATIVE
Ketones, UA: NEGATIVE
Leukocytes, UA: NEGATIVE
Nitrite, UA: NEGATIVE
Protein, UA: NEGATIVE
Spec Grav, UA: 1.025 (ref 1.010–1.025)
Urobilinogen, UA: 0.2 E.U./dL
pH, UA: 6 (ref 5.0–8.0)

## 2022-04-12 NOTE — Progress Notes (Signed)
Patient came in today with complaints of frequent urination and always a sensation to have to urinate. Patient also states she is having some vaginal discharge. Patient gave a urine sample as well as an aptima swab for testing.

## 2022-04-13 LAB — CERVICOVAGINAL ANCILLARY ONLY
Bacterial Vaginitis (gardnerella): POSITIVE — AB
Candida Glabrata: NEGATIVE
Candida Vaginitis: NEGATIVE
Comment: NEGATIVE
Comment: NEGATIVE
Comment: NEGATIVE

## 2022-04-14 ENCOUNTER — Other Ambulatory Visit (HOSPITAL_BASED_OUTPATIENT_CLINIC_OR_DEPARTMENT_OTHER): Payer: Self-pay | Admitting: Obstetrics & Gynecology

## 2022-04-14 LAB — URINE CULTURE

## 2022-04-14 MED ORDER — METRONIDAZOLE 500 MG PO TABS: 500.0000 mg | ORAL_TABLET | Freq: Two times a day (BID) | ORAL | 0 refills | Status: DC

## 2022-04-26 ENCOUNTER — Ambulatory Visit (HOSPITAL_BASED_OUTPATIENT_CLINIC_OR_DEPARTMENT_OTHER): Payer: Federal, State, Local not specified - PPO

## 2022-04-26 ENCOUNTER — Encounter (HOSPITAL_BASED_OUTPATIENT_CLINIC_OR_DEPARTMENT_OTHER): Payer: Self-pay

## 2022-05-26 DIAGNOSIS — R5383 Other fatigue: Secondary | ICD-10-CM | POA: Diagnosis not present

## 2022-05-26 DIAGNOSIS — Z331 Pregnant state, incidental: Secondary | ICD-10-CM | POA: Diagnosis not present

## 2022-05-26 DIAGNOSIS — R112 Nausea with vomiting, unspecified: Secondary | ICD-10-CM | POA: Diagnosis not present

## 2022-05-31 DIAGNOSIS — E1065 Type 1 diabetes mellitus with hyperglycemia: Secondary | ICD-10-CM | POA: Diagnosis not present

## 2022-06-08 ENCOUNTER — Encounter: Payer: Self-pay | Admitting: Internal Medicine

## 2022-06-08 ENCOUNTER — Ambulatory Visit (INDEPENDENT_AMBULATORY_CARE_PROVIDER_SITE_OTHER): Payer: Federal, State, Local not specified - PPO | Admitting: Internal Medicine

## 2022-06-08 VITALS — BP 120/70 | HR 92 | Ht 60.0 in | Wt 152.4 lb

## 2022-06-08 DIAGNOSIS — E1065 Type 1 diabetes mellitus with hyperglycemia: Secondary | ICD-10-CM | POA: Diagnosis not present

## 2022-06-08 LAB — POCT GLYCOSYLATED HEMOGLOBIN (HGB A1C): Hemoglobin A1C: 7.3 % — AB (ref 4.0–5.6)

## 2022-06-08 MED ORDER — INSULIN ASPART 100 UNIT/ML IJ SOLN: INTRAMUSCULAR | 11 refills | Status: DC

## 2022-06-08 NOTE — Patient Instructions (Signed)
Enter #4 grams with Meals and 2 grams of carbohydrates with a snack     HOW TO TREAT LOW BLOOD SUGARS (Blood sugar LESS THAN 70 MG/DL) Please follow the RULE OF 15 for the treatment of hypoglycemia treatment (when your (blood sugars are less than 70 mg/dL)   STEP 1: Take 15 grams of carbohydrates when your blood sugar is low, which includes:  3-4 GLUCOSE TABS  OR 3-4 OZ OF JUICE OR REGULAR SODA OR ONE TUBE OF GLUCOSE GEL    STEP 2: RECHECK blood sugar in 15 MINUTES STEP 3: If your blood sugar is still low at the 15 minute recheck --> then, go back to STEP 1 and treat AGAIN with another 15 grams of carbohydrates.

## 2022-06-08 NOTE — Progress Notes (Signed)
Name: Judy Cook  MRN/ DOB: [id number], 2000-08-01   Age/ Sex: 22 y.o., female    PCP: Ailene Ards, NP   Reason for Endocrinology Evaluation: Type 1 Diabetes Mellitus     Date of Initial Endocrinology Visit: 07/26/2021    PATIENT IDENTIFIER: Judy Cook is a 22 y.o. female with a past medical history of T1DM. The patient presented for initial endocrinology clinic visit on 07/26/2021  for consultative assistance with her diabetes management.    HPI: [name] was    Diagnosed with DM at age 39               Hemoglobin A1c has ranged from 10.7% in 2023, peaking at 12% in 2021. Patient required assistance for hypoglycemia: when she was young Patient has required hospitalization within the last 1 year from hyper or hypoglycemia: Patient with history of DKA but not in 2022  On her initial visit to our clinic she had an A1c of 10.7%, she was referred for pump training on the Tandem 10/2021     SUBJECTIVE:   During the last visit (12/05/2021): A1c 8.7% .   Today (06/08/22): Judy Cook is here for a follow up on diabetes management. She checks her blood sugars multiple  times daily through CGM. The patient has  had hypoglycemic episodes since the last clinic visit.   She presented to the ED multiple times since her last visit here due to abdominal pain, abscess, and ankle pain Has occasional nausea and diarrhea , she currently at 11 weeks of gestation , pending abortion in 2 days     This patient with type 1 diabetes is treated with Tandem  (insulin pump). During the visit the pump basal and bolus doses were reviewed including carb/insulin rations and supplemental doses. The clinical list was updated. The glucose meter download was reviewed in detail to determine if the current pump settings are providing the best glycemic control without excessive hypoglycemia.  Pump and meter download:    Pump   Tandem  Settings   Insulin type    Novolog    Basal rate       0000 0.7 u/h               I:C ratio       0000 1:1                  Sensitivity       0000  45      Goal       0000  120             Type & Model of Pump: Tandem  Insulin Type: Currently using NovoLog.  Body mass index is 29.76 kg/m.  PUMP STATISTICS: Average BG: 176  Average Daily Carbs (g): 11  Average Total Daily Insulin: 57.26  Average Daily Basal: 21.85 (38 %) Average Daily Bolus: 35.4 (62 %)      HOME DIABETES REGIMEN:  Novolog    Statin: no ACE-I/ARB: no Prior Diabetic Education: yes   CONTINUOUS GLUCOSE MONITORING RECORD INTERPRETATION    Dates of Recording: 1/19-2/15/2024  Sensor description:dexcom  Results statistics:   CGM use % of time 87  Average and SD 176/68  Time in range  56  %  % Time Above 180 29  % Time above 250 13  % Time Below target 1.7    Glycemic patterns summary: Patient has been noted with optimal BG's at night and in between the meals  Hyperglycemic episodes  postprandial   Hypoglycemic episodes occurred at night  Overnight periods: Optimal with variable hypoglycemia      DIABETIC COMPLICATIONS: Microvascular complications:   Denies: CKD, retinopathy, neuropathy  Last eye exam: Completed years   Macrovascular complications:   Denies: CAD, PVD, CVA   PAST HISTORY: Past Medical History:  Past Medical History:  Diagnosis Date   Diabetes mellitus ([place])    Past Surgical History: No past surgical history on file.  Social History:  reports that she has quit smoking. Her smoking use included cigarettes. She has never used smokeless tobacco. She reports current alcohol use. She reports current drug use. Drug: Marijuana. Family History:  Family History  Problem Relation Age of Onset   Diabetes Paternal Grandfather    Heart attack Paternal Grandfather    Diabetes Paternal Grandmother    Cancer Maternal Grandmother        lung   Alcohol abuse Maternal Grandfather     Alcohol abuse Father    Drug abuse Father    High Cholesterol Father    Alcohol abuse Mother    Depression Mother    Drug abuse Mother      HOME MEDICATIONS: Allergies as of 06/08/2022   No Known Allergies      Medication List        Accurate as of June 08, 2022  8:58 AM. If you have any questions, ask your nurse or doctor.          STOP taking these medications    metroNIDAZOLE 500 MG tablet Commonly known as: FLAGYL Stopped by: Dorita Sciara, MD       TAKE these medications    Accu-Chek Guide test strip Generic drug: glucose blood Use to check your blood sugar 4 times a day (before meals and at bedtime)   Accu-Chek Softclix Lancets lancets SMARTSIG:Topical 1-4 Times Daily   blood glucose meter kit and supplies Dispense based on patient and insurance preference. Use up to four times daily as directed. (FOR ICD-10 E10.9, E11.9).   capsicum 0.075 % topical cream Commonly known as: ZOSTRIX Apply 1 application. topically 2 (two) times daily.   Dexcom G6 Sensor Misc 1 Device by Does not apply route as directed.   Dexcom G6 Transmitter Misc 1 Device by Does not apply route as directed.   fluticasone 50 MCG/ACT nasal spray Commonly known as: FLONASE Place 2 sprays into both nostrils daily.   insulin aspart 100 UNIT/ML injection Commonly known as: NovoLOG Max Daily 60 units per pump   norgestimate-ethinyl estradiol 0.25-35 MG-MCG tablet Commonly known as: ORTHO-CYCLEN Take 1 tablet by mouth daily.   ondansetron 4 MG tablet Commonly known as: ZOFRAN Take 1 tablet (4 mg total) by mouth every 6 (six) hours as needed for nausea or vomiting.   pantoprazole 40 MG tablet Commonly known as: Protonix Take 1 tablet (40 mg total) by mouth daily.   Potassium Chloride ER 20 MEQ Tbcr Take 20 mEq by mouth daily for 7 doses.         ALLERGIES: No Known Allergies   REVIEW OF SYSTEMS: A comprehensive ROS was conducted with the patient and is  negative except as per HPI and below:      OBJECTIVE:   VITAL SIGNS: BP 120/70 (BP Location: Left Arm, Patient Position: Sitting, Cuff Size: Small)   Pulse 92   Ht 5' (1.524 m)   Wt 152 lb 6.4 oz (69.1 kg)   SpO2 96%   BMI 29.76 kg/m  PHYSICAL EXAM:  General: Pt appears well and is in NAD  Lungs: Clear with good BS bilat   Heart: RRR   Abdomen: soft, nontender  Extremities:  Lower extremities - No pretibial edema.   Neuro: MS is good with appropriate affect, pt is alert and Ox3    DM foot exam: 06/08/2022  The skin of the feet is intact without sores or ulcerations. The pedal pulses are 2+ on right and 2+ on left. The sensation is intact to a screening 5.07, 10 gram monofilament bilaterally    DATA REVIEWED:  Lab Results  Component Value Date   HGBA1C 7.3 (A) 06/08/2022   HGBA1C 8.7 (A) 12/05/2021   HGBA1C 10.7 (H) 06/02/2021     ASSESSMENT / PLAN / RECOMMENDATIONS:   1) Type 1 Diabetes Mellitus, Sub-optimally controlled, With out complications - Most recent A1c of 7.3%. Goal A1c <7.0%.      -I have praised the patient on improved glycemic control -In reviewing her pump/CGM download, the patient has been noted with hypoglycemia at night and at times between the meals, based on this I will reduce her basal rate -Unfortunately she does not count carbohydrates, has been guesstimating which results in severe hyperglycemia.  The patient will be given a standard dose of carbohydrates to enter with each meal and a snack    Pump   Tandem  Settings   Insulin type   Novolog    Basal rate       0000 0.7 u/h               I:C ratio       0000 1:1    #4  Grams with a meal,# 2 g with a snack              Sensitivity       0000  45      Goal       0000  120          MEDICATIONS:   Novolog    EDUCATION / INSTRUCTIONS: BG monitoring instructions: Patient is instructed to check her blood sugars 3  x daily before meals   Call [facility]  cl[facility] if: BG persistently < 70  I reviewed the Rule of 15 for the treatment of hypoglycemia in detail with the patient. Literature supplied.   2) Diabetic complications:  Eye: Does not have known diabetic retinopathy.  Neuro/ Feet: Does not have known diabetic peripheral neuropathy. Renal: Patient does not have known baseline CKD. She is not on an ACEI/ARB at present.    3) Lipids: No indication for statin therapy at this time      Follow-up in 6 months    Signed electronically by: Mack Guise, MD  [facility] End[facility]in[facility]  [facility] [address] [place], [place] [address] Phone: [phone] FAX: [phone]   CC: Ailene Ards, NP [place] [address] Phone: [phone]  Fax: [phone]    Return to Endocrinology clinic as below: Future Appointments  Date Time Provider [place]  01/18/2023 10:00 AM Ailene Ards, NP [facility] None

## 2022-06-15 ENCOUNTER — Other Ambulatory Visit: Payer: Self-pay

## 2022-06-15 ENCOUNTER — Emergency Department (HOSPITAL_BASED_OUTPATIENT_CLINIC_OR_DEPARTMENT_OTHER)
Admission: EM | Admit: 2022-06-15 | Discharge: 2022-06-15 | Disposition: A | Discharge status: Home / Self Care | Payer: Federal, State, Local not specified - PPO | Attending: Emergency Medicine | Admitting: Emergency Medicine

## 2022-06-15 ENCOUNTER — Encounter (HOSPITAL_BASED_OUTPATIENT_CLINIC_OR_DEPARTMENT_OTHER): Payer: Self-pay | Admitting: Emergency Medicine

## 2022-06-15 DIAGNOSIS — Z3A01 Less than 8 weeks gestation of pregnancy: Secondary | ICD-10-CM | POA: Diagnosis not present

## 2022-06-15 DIAGNOSIS — R100 Acute abdomen: Secondary | ICD-10-CM | POA: Diagnosis not present

## 2022-06-15 DIAGNOSIS — E119 Type 2 diabetes mellitus without complications: Secondary | ICD-10-CM | POA: Insufficient documentation

## 2022-06-15 DIAGNOSIS — Z794 Long term (current) use of insulin: Secondary | ICD-10-CM | POA: Insufficient documentation

## 2022-06-15 DIAGNOSIS — R112 Nausea with vomiting, unspecified: Secondary | ICD-10-CM | POA: Diagnosis not present

## 2022-06-15 DIAGNOSIS — R1084 Generalized abdominal pain: Secondary | ICD-10-CM | POA: Diagnosis not present

## 2022-06-15 DIAGNOSIS — R739 Hyperglycemia, unspecified: Secondary | ICD-10-CM | POA: Diagnosis not present

## 2022-06-15 DIAGNOSIS — O219 Vomiting of pregnancy, unspecified: Secondary | ICD-10-CM | POA: Diagnosis not present

## 2022-06-15 LAB — URINALYSIS, ROUTINE W REFLEX MICROSCOPIC
Bilirubin Urine: NEGATIVE
Glucose, UA: >1000 mg/dL — AB
Ketones, ur: >80 mg/dL — AB
Leukocytes,Ua: NEGATIVE
Nitrite: NEGATIVE
Protein, ur: >300 mg/dL — AB
Specific Gravity, Urine: 1.027 (ref 1.005–1.030)
pH: 7 (ref 5.0–8.0)

## 2022-06-15 LAB — COMPREHENSIVE METABOLIC PANEL
ALT: 32 U/L (ref 0–44)
AST: 27 U/L (ref 15–41)
Albumin: 4.7 g/dL (ref 3.5–5.0)
Alkaline Phosphatase: 74 U/L (ref 38–126)
Anion gap: 14 (ref 5–15)
BUN: 9 mg/dL (ref 6–20)
CO2: 23 mmol/L (ref 22–32)
Calcium: 10.1 mg/dL (ref 8.9–10.3)
Chloride: 101 mmol/L (ref 98–111)
Creatinine, Ser: 0.77 mg/dL (ref 0.44–1.00)
GFR, Estimated: >60 mL/min (ref >60)
Glucose, Bld: 226 mg/dL — ABNORMAL HIGH (ref 70–99)
Potassium: 3.2 mmol/L — ABNORMAL LOW (ref 3.5–5.1)
Sodium: 138 mmol/L (ref 135–145)
Total Bilirubin: 0.4 mg/dL (ref 0.3–1.2)
Total Protein: 8.1 g/dL (ref 6.5–8.1)

## 2022-06-15 LAB — CBC
HCT: 39.9 % (ref 36.0–46.0)
Hemoglobin: 13.9 g/dL (ref 12.0–15.0)
MCH: 31.9 pg (ref 26.0–34.0)
MCHC: 34.8 g/dL (ref 30.0–36.0)
MCV: 91.5 fL (ref 80.0–100.0)
Platelets: 360 10*3/uL (ref 150–400)
RBC: 4.36 MIL/uL (ref 3.87–5.11)
RDW: 13.2 % (ref 11.5–15.5)
WBC: 9.3 10*3/uL (ref 4.0–10.5)
nRBC: 0 % (ref 0.0–0.2)

## 2022-06-15 LAB — CBG MONITORING, ED: Glucose-Capillary: 222 mg/dL — ABNORMAL HIGH (ref 70–99)

## 2022-06-15 LAB — PREGNANCY, URINE: Preg Test, Ur: POSITIVE — AB

## 2022-06-15 LAB — LIPASE, BLOOD: Lipase: <10 U/L — ABNORMAL LOW (ref 11–51)

## 2022-06-15 MED ORDER — ONDANSETRON HCL 4 MG/2ML IJ SOLN
4.0000 mg | Freq: Once | INTRAMUSCULAR | Status: AC
  Administered 2022-06-15: 4 mg via INTRAVENOUS
  Filled 2022-06-15: qty 2

## 2022-06-15 MED ORDER — POTASSIUM CHLORIDE CRYS ER 20 MEQ PO TBCR
40.0000 meq | EXTENDED_RELEASE_TABLET | Freq: Once | ORAL | Status: AC
  Administered 2022-06-15: 40 meq via ORAL
  Filled 2022-06-15: qty 2

## 2022-06-15 MED ORDER — DICYCLOMINE HCL 10 MG PO CAPS
20.0000 mg | ORAL_CAPSULE | Freq: Once | ORAL | Status: AC
  Administered 2022-06-15: 20 mg via ORAL
  Filled 2022-06-15: qty 2

## 2022-06-15 MED ORDER — DROPERIDOL 2.5 MG/ML IJ SOLN
2.5000 mg | Freq: Once | INTRAMUSCULAR | Status: AC
  Administered 2022-06-15: 2.5 mg via INTRAVENOUS
  Filled 2022-06-15: qty 2

## 2022-06-15 MED ORDER — FAMOTIDINE IN NACL 20-0.9 MG/50ML-% IV SOLN
20.0000 mg | Freq: Once | INTRAVENOUS | Status: AC
  Administered 2022-06-15: 20 mg via INTRAVENOUS
  Filled 2022-06-15: qty 50

## 2022-06-15 MED ORDER — DICYCLOMINE HCL 20 MG PO TABS: 20.0000 mg | ORAL_TABLET | Freq: Two times a day (BID) | ORAL | 0 refills | Status: DC

## 2022-06-15 MED ORDER — LACTATED RINGERS IV BOLUS
1000.0000 mL | Freq: Once | INTRAVENOUS | Status: AC
  Administered 2022-06-15: 1000 mL via INTRAVENOUS

## 2022-06-15 MED ORDER — ONDANSETRON HCL 4 MG PO TABS: 4.0000 mg | ORAL_TABLET | Freq: Four times a day (QID) | ORAL | 0 refills | Status: DC

## 2022-06-15 MED ORDER — ALUM & MAG HYDROXIDE-SIMETH 200-200-20 MG/5ML PO SUSP
30.0000 mL | Freq: Once | ORAL | Status: AC
  Administered 2022-06-15: 30 mL via ORAL
  Filled 2022-06-15: qty 30

## 2022-06-15 MED ORDER — ACETAMINOPHEN 325 MG PO TABS
650.0000 mg | ORAL_TABLET | Freq: Once | ORAL | Status: AC
  Administered 2022-06-15: 650 mg via ORAL
  Filled 2022-06-15: qty 2

## 2022-06-15 NOTE — ED Notes (Signed)
Pt in bed, pt reports decreased pain and nausea.

## 2022-06-15 NOTE — Discharge Instructions (Addendum)
You were seen in the emergency department for your nausea and vomiting.  Your workup showed that your potassium level was mildly low but otherwise no signs of severe dehydration or DKA.  Your vomiting may be related to the alcohol that you drink last night or due to your marijuana use and you should try to cut back on both to help prevent worsening symptoms in the future.  You should follow-up with your primary doctor to have your symptoms rechecked.  You should return to the emergency department for repetitive vomiting despite your nausea medication, if your blood sugars reading significantly high, you have severe abdominal pain or fevers or if you have any other new or concerning symptoms.

## 2022-06-15 NOTE — ED Provider Notes (Signed)
[facility] Provider Note   CSN: [id number] Arrival date & time: 06/15/22  [id number]     History  Chief Complaint  Patient presents with   Abdominal Pain    Judy Cook is a 22 y.o. female.  Patient is a 22 year old female with a past medical history of diabetes, cannabinoid hyperemesis presenting to the emergency department with nausea and vomiting.  Patient states that she drank a lot of alcohol last night which she does not normally do and started to have nausea and vomiting around midnight.  She states that she is having diffuse abdominal cramping.  She states that she also smokes marijuana daily and has had cannabinoid hyperemesis in the past and this does feel similar.  She also reports that she did have an abortion last Saturday and is unsure if the vomiting could be related to that.  She denies any significant vaginal bleeding or abnormal vaginal discharge.  She states that she has had some constipation but denies any dysuria or hematuria.  She states that her blood sugars have been in the 200s at home.  The history is provided by the patient.  Abdominal Pain      Home Medications Prior to Admission medications   Medication Sig Start Date End Date Taking? Authorizing Provider  dicyclomine (BENTYL) 20 MG tablet Take 1 tablet (20 mg total) by mouth 2 (two) times daily. 06/15/22  Yes Maylon Peppers, [place] K, DO  ondansetron (ZOFRAN) 4 MG tablet Take 1 tablet (4 mg total) by mouth every 6 (six) hours. 06/15/22  Yes Leanord Asal K, DO  ACCU-CHEK GUIDE test strip Use to check your blood sugar 4 times a day (before meals and at bedtime) 06/02/21   Ailene Ards, NP  Accu-Chek Softclix Lancets lancets SMARTSIG:Topical 1-4 Times Daily 01/25/21   [provider]  blood glucose meter kit and supplies Dispense based on patient and insurance preference. Use up to four times daily as directed. (FOR ICD-10 E10.9, E11.9). 01/25/21    Isla Pence, MD  capsicum (ZOSTRIX) 0.075 % topical cream Apply 1 application. topically 2 (two) times daily. 08/07/21   Lynden Oxford Scales, PA-C  Continuous Blood Gluc Sensor (DEXCOM G6 SENSOR) MISC 1 Device by Does not apply route as directed. 07/26/21   Shamleffer, Melanie Crazier, MD  Continuous Blood Gluc Transmit (DEXCOM G6 TRANSMITTER) MISC 1 Device by Does not apply route as directed. 07/26/21   Shamleffer, Melanie Crazier, MD  fluticasone (FLONASE) 50 MCG/ACT nasal spray Place 2 sprays into both nostrils daily. 07/07/21   Ailene Ards, NP  insulin aspart (NOVOLOG) 100 UNIT/ML injection Max Daily 60 units per pump 06/08/22   Shamleffer, Melanie Crazier, MD  norgestimate-ethinyl estradiol (ORTHO-CYCLEN) 0.25-35 MG-MCG tablet Take 1 tablet by mouth daily. 06/24/21   Luvenia Redden, PA-C  pantoprazole (PROTONIX) 40 MG tablet Take 1 tablet (40 mg total) by mouth daily. 12/07/19 01/06/20  Antonieta Pert, MD  potassium chloride 20 MEQ TBCR Take 20 mEq by mouth daily for 7 doses. 12/07/19 12/14/19  Antonieta Pert, MD      Allergies    Patient has no known allergies.    Review of Systems   Review of Systems  Gastrointestinal:  Positive for abdominal pain.    Physical Exam Updated Vital Signs BP 125/76 (BP Location: Right Arm)   Pulse 79   Temp 97.6 F (36.4 C) (Oral)   Resp 18   SpO2 100%  Physical Exam Vitals and nursing note reviewed.  Constitutional:      Appearance: She is well-developed. She is not toxic-appearing.     Comments: Writhing around in bed  HENT:     Head: Normocephalic and atraumatic.     Mouth/Throat:     Mouth: Mucous membranes are moist.     Pharynx: Oropharynx is clear.  Eyes:     Pupils: Pupils are equal, round, and reactive to light.  Cardiovascular:     Rate and Rhythm: Normal rate and regular rhythm.     Heart sounds: Normal heart sounds.  Pulmonary:     Effort: Pulmonary effort is normal.     Breath sounds: Normal breath sounds.  Abdominal:     General:  Abdomen is flat.     Palpations: Abdomen is soft.     Tenderness: There is generalized abdominal tenderness. There is no guarding or rebound.  Skin:    General: Skin is warm and dry.  Neurological:     General: No focal deficit present.     Mental Status: She is alert and oriented to person, place, and time.  Psychiatric:        Mood and Affect: Mood normal.        Behavior: Behavior normal.     ED Results / Procedures / Treatments   Labs (all labs ordered are listed, but only abnormal results are displayed) Labs Reviewed  LIPASE, BLOOD - Abnormal; Notable for the following components:      Result Value   Lipase <10 (*)    All other components within normal limits  COMPREHENSIVE METABOLIC PANEL - Abnormal; Notable for the following components:   Potassium 3.2 (*)    Glucose, Bld 226 (*)    All other components within normal limits  URINALYSIS, ROUTINE W REFLEX MICROSCOPIC - Abnormal; Notable for the following components:   Glucose, UA >1,000 (*)    Hgb urine dipstick LARGE (*)    Ketones, ur >80 (*)    Protein, ur >300 (*)    Bacteria, UA FEW (*)    All other components within normal limits  PREGNANCY, URINE - Abnormal; Notable for the following components:   Preg Test, Ur POSITIVE (*)    All other components within normal limits  CBG MONITORING, ED - Abnormal; Notable for the following components:   Glucose-Capillary 222 (*)    All other components within normal limits  CBC    EKG None  Radiology No results found.  Procedures Procedures    Medications Ordered in ED Medications  droperidol (INAPSINE) 2.5 MG/ML injection 2.5 mg (2.5 mg Intravenous Given 06/15/22 0924)  famotidine (PEPCID) IVPB 20 mg premix (0 mg Intravenous Stopped 06/15/22 1015)  alum & mag hydroxide-simeth (MAALOX/MYLANTA) 200-200-20 MG/5ML suspension 30 mL (30 mLs Oral Given 06/15/22 0924)  lactated ringers bolus 1,000 mL (1,000 mLs Intravenous New Bag/Given 06/15/22 0928)  potassium chloride  SA (KLOR-CON M) CR tablet 40 mEq (40 mEq Oral Given 06/15/22 1000)  ondansetron (ZOFRAN) injection 4 mg (4 mg Intravenous Given 06/15/22 1016)  dicyclomine (BENTYL) capsule 20 mg (20 mg Oral Given 06/15/22 1015)  acetaminophen (TYLENOL) tablet 650 mg (650 mg Oral Given 06/15/22 1015)    ED Course/ Medical Decision Making/ A&P Clinical Course as of 06/15/22 1116  Thu Jun 15, 2022  0859 Patient's pregnancy test is positive but reports she had an abortion last Saturday.  [VK]  [id number] of patient's labs are within normal range.  She does have ketones in the urine but has no elevated anion  gap and glucose is 226 making DKA unlikely.  She was mildly hypokalemic which will be repleted.  She is pending her medications at this time and will be reassessed after medication management. [VK]  [id number] Upon reassessment, patient reports symptoms improved though not completely resolved. Abdomen is soft without point tenderness and she is comfortable appearing. She will be given additional dose of pain and nausea medicine and will have PO challenge.  [VK]  1113 Upon reassessment, the patient reports improvement of her symptoms.  She is stable for discharge home with primary care follow-up.  She was educated on stopping marijuana to help prevent worsening symptoms in the future. [VK]    Clinical Course User Index [VK] Kemper Durie, DO                             Medical Decision Making This patient presents to the ED with chief complaint(s) of nausea and vomiting with pertinent past medical history of diabetes, cannabinoid hyperemesis, recent abortion which further complicates the presenting complaint. The complaint involves an extensive differential diagnosis and also carries with it a high risk of complications and morbidity.    The differential diagnosis includes DKA, cannabinoid hyperemesis, dehydration, pancreatitis, hepatitis, cholelithiasis, cholecystitis, patient has no urinary symptoms making  UTI unlikely  Additional history obtained: Additional history obtained from N/A Records reviewed Primary Care Documents  ED Course and Reassessment: Patient was awake and alert uncomfortable appearing on arrival complaining of abdominal pain and nausea.  Patient will be given droperidol for nausea and GI cocktail.  She had labs performed by triage to evaluate for cause of her symptoms.  And she will be closely reassessed.  Independent labs interpretation:  The following labs were independently interpreted: pregnancy test positive consistent with recent abortion, mild hypokalemia, mild hyperglycemia without evidence of DKA  Independent visualization of imaging: N/A  Consultation: - Consulted or discussed management/test interpretation w/ external professional: N/A  Consideration for admission or further workup: Patient has no emergent conditions requiring admission or further work-up at this time and is stable for discharge home with primary care follow-up  Social Determinants of health: N/A    Amount and/or Complexity of Data Reviewed Labs: ordered.  Risk OTC drugs. Prescription drug management.          Final Clinical Impression(s) / ED Diagnoses Final diagnoses:  Nausea and vomiting, unspecified vomiting type    Rx / DC Orders ED Discharge Orders          Ordered    ondansetron (ZOFRAN) 4 MG tablet  Every 6 hours        06/15/22 1114    dicyclomine (BENTYL) 20 MG tablet  2 times daily        06/15/22 1114              Argyle, [place] K, [place] 06/15/22 1116

## 2022-06-15 NOTE — ED Notes (Signed)
Pt's CBG result was 222. Informed Darnelle Maffucci - RN.

## 2022-06-15 NOTE — ED Triage Notes (Signed)
Pt from home via [facility] with reports of abdominal pain and emesis. Pt reports drinking alcohol and smoking marijuana yesterday. Pt is type 1 diabetic.

## 2022-06-17 ENCOUNTER — Encounter (HOSPITAL_COMMUNITY): Payer: Self-pay

## 2022-06-17 ENCOUNTER — Other Ambulatory Visit: Payer: Self-pay

## 2022-06-17 ENCOUNTER — Observation Stay (HOSPITAL_COMMUNITY)
Admission: EM | Admit: 2022-06-17 | Discharge: 2022-06-18 | Disposition: A | Discharge status: Home / Self Care | Payer: Federal, State, Local not specified - PPO | Attending: Internal Medicine | Admitting: Internal Medicine

## 2022-06-17 ENCOUNTER — Emergency Department (HOSPITAL_COMMUNITY): Payer: Federal, State, Local not specified - PPO

## 2022-06-17 DIAGNOSIS — R109 Unspecified abdominal pain: Secondary | ICD-10-CM

## 2022-06-17 DIAGNOSIS — O034 Incomplete spontaneous abortion without complication: Secondary | ICD-10-CM

## 2022-06-17 DIAGNOSIS — Z3A11 11 weeks gestation of pregnancy: Secondary | ICD-10-CM

## 2022-06-17 DIAGNOSIS — R112 Nausea with vomiting, unspecified: Principal | ICD-10-CM

## 2022-06-17 DIAGNOSIS — R102 Pelvic and perineal pain: Secondary | ICD-10-CM | POA: Diagnosis not present

## 2022-06-17 DIAGNOSIS — E162 Hypoglycemia, unspecified: Secondary | ICD-10-CM | POA: Diagnosis not present

## 2022-06-17 DIAGNOSIS — E876 Hypokalemia: Secondary | ICD-10-CM | POA: Diagnosis present

## 2022-06-17 DIAGNOSIS — Z87891 Personal history of nicotine dependence: Secondary | ICD-10-CM | POA: Diagnosis not present

## 2022-06-17 DIAGNOSIS — R93811 Abnormal radiologic findings on diagnostic imaging of right testicle: Secondary | ICD-10-CM | POA: Diagnosis not present

## 2022-06-17 DIAGNOSIS — E109 Type 1 diabetes mellitus without complications: Secondary | ICD-10-CM | POA: Diagnosis not present

## 2022-06-17 DIAGNOSIS — Z794 Long term (current) use of insulin: Secondary | ICD-10-CM | POA: Insufficient documentation

## 2022-06-17 DIAGNOSIS — N939 Abnormal uterine and vaginal bleeding, unspecified: Secondary | ICD-10-CM | POA: Diagnosis not present

## 2022-06-17 DIAGNOSIS — R9389 Abnormal findings on diagnostic imaging of other specified body structures: Secondary | ICD-10-CM

## 2022-06-17 DIAGNOSIS — E161 Other hypoglycemia: Secondary | ICD-10-CM | POA: Diagnosis not present

## 2022-06-17 DIAGNOSIS — O26892 Other specified pregnancy related conditions, second trimester: Secondary | ICD-10-CM | POA: Diagnosis not present

## 2022-06-17 DIAGNOSIS — R1084 Generalized abdominal pain: Secondary | ICD-10-CM | POA: Diagnosis not present

## 2022-06-17 DIAGNOSIS — F1290 Cannabis use, unspecified, uncomplicated: Secondary | ICD-10-CM | POA: Diagnosis present

## 2022-06-17 DIAGNOSIS — R9431 Abnormal electrocardiogram [ECG] [EKG]: Secondary | ICD-10-CM | POA: Diagnosis not present

## 2022-06-17 DIAGNOSIS — I10 Essential (primary) hypertension: Secondary | ICD-10-CM | POA: Diagnosis not present

## 2022-06-17 DIAGNOSIS — R1115 Cyclical vomiting syndrome unrelated to migraine: Secondary | ICD-10-CM | POA: Diagnosis present

## 2022-06-17 LAB — COMPREHENSIVE METABOLIC PANEL
ALT: 37 U/L (ref 0–44)
AST: 37 U/L (ref 15–41)
Albumin: 4.1 g/dL (ref 3.5–5.0)
Alkaline Phosphatase: 65 U/L (ref 38–126)
Anion gap: 14 (ref 5–15)
BUN: 7 mg/dL (ref 6–20)
CO2: 23 mmol/L (ref 22–32)
Calcium: 9 mg/dL (ref 8.9–10.3)
Chloride: 101 mmol/L (ref 98–111)
Creatinine, Ser: 0.85 mg/dL (ref 0.44–1.00)
GFR, Estimated: >60 mL/min (ref >60)
Glucose, Bld: 70 mg/dL (ref 70–99)
Potassium: 2.7 mmol/L — CL (ref 3.5–5.1)
Sodium: 138 mmol/L (ref 135–145)
Total Bilirubin: 0.5 mg/dL (ref 0.3–1.2)
Total Protein: 7.5 g/dL (ref 6.5–8.1)

## 2022-06-17 LAB — URINALYSIS, ROUTINE W REFLEX MICROSCOPIC
Bacteria, UA: NONE SEEN
Bilirubin Urine: NEGATIVE
Glucose, UA: NEGATIVE mg/dL
Hgb urine dipstick: NEGATIVE
Ketones, ur: 20 mg/dL — AB
Leukocytes,Ua: NEGATIVE
Nitrite: NEGATIVE
Protein, ur: >=300 mg/dL — AB
Specific Gravity, Urine: 1.016 (ref 1.005–1.030)
pH: 8 (ref 5.0–8.0)

## 2022-06-17 LAB — CBC WITH DIFFERENTIAL/PLATELET
Abs Immature Granulocytes: 0.03 10*3/uL (ref 0.00–0.07)
Basophils Absolute: 0.1 10*3/uL (ref 0.0–0.1)
Basophils Relative: 1 %
Eosinophils Absolute: 0 10*3/uL (ref 0.0–0.5)
Eosinophils Relative: 0 %
HCT: 41.7 % (ref 36.0–46.0)
Hemoglobin: 14.2 g/dL (ref 12.0–15.0)
Immature Granulocytes: 0 %
Lymphocytes Relative: 34 %
Lymphs Abs: 2.4 10*3/uL (ref 0.7–4.0)
MCH: 31.6 pg (ref 26.0–34.0)
MCHC: 34.1 g/dL (ref 30.0–36.0)
MCV: 92.7 fL (ref 80.0–100.0)
Monocytes Absolute: 0.6 10*3/uL (ref 0.1–1.0)
Monocytes Relative: 9 %
Neutro Abs: 3.9 10*3/uL (ref 1.7–7.7)
Neutrophils Relative %: 56 %
Platelets: 461 10*3/uL — ABNORMAL HIGH (ref 150–400)
RBC: 4.5 MIL/uL (ref 3.87–5.11)
RDW: 13.1 % (ref 11.5–15.5)
WBC: 7 10*3/uL (ref 4.0–10.5)
nRBC: 0 % (ref 0.0–0.2)

## 2022-06-17 LAB — ABO/RH: ABO/RH(D): O NEG

## 2022-06-17 LAB — GLUCOSE, CAPILLARY: Glucose-Capillary: 144 mg/dL — ABNORMAL HIGH (ref 70–99)

## 2022-06-17 LAB — HCG, QUANTITATIVE, PREGNANCY: hCG, Beta Chain, Quant, S: 3462 m[IU]/mL — ABNORMAL HIGH (ref <5)

## 2022-06-17 LAB — LIPASE, BLOOD: Lipase: 29 U/L (ref 11–51)

## 2022-06-17 LAB — CBG MONITORING, ED: Glucose-Capillary: 75 mg/dL (ref 70–99)

## 2022-06-17 MED ORDER — DROPERIDOL 2.5 MG/ML IJ SOLN
2.5000 mg | Freq: Once | INTRAMUSCULAR | Status: AC
  Administered 2022-06-17: 2.5 mg via INTRAVENOUS
  Filled 2022-06-17: qty 2

## 2022-06-17 MED ORDER — POTASSIUM CHLORIDE CRYS ER 20 MEQ PO TBCR: 20.0000 meq | EXTENDED_RELEASE_TABLET | Freq: Two times a day (BID) | ORAL | 0 refills | Status: DC

## 2022-06-17 MED ORDER — KETOROLAC TROMETHAMINE 15 MG/ML IJ SOLN
15.0000 mg | Freq: Once | INTRAMUSCULAR | Status: AC
  Administered 2022-06-17: 15 mg via INTRAVENOUS
  Filled 2022-06-17: qty 1

## 2022-06-17 MED ORDER — POTASSIUM CHLORIDE 10 MEQ/100ML IV SOLN: 10.0000 meq | INTRAVENOUS | Status: DC

## 2022-06-17 MED ORDER — POTASSIUM CHLORIDE 10 MEQ/100ML IV SOLN
10.0000 meq | INTRAVENOUS | Status: AC
  Administered 2022-06-17 (×3): 10 meq via INTRAVENOUS
  Filled 2022-06-17 (×2): qty 100

## 2022-06-17 MED ORDER — ONDANSETRON 8 MG PO TBDP: 8.0000 mg | ORAL_TABLET | Freq: Three times a day (TID) | ORAL | 0 refills | Status: DC | PRN

## 2022-06-17 MED ORDER — MAGNESIUM SULFATE 2 GM/50ML IV SOLN
2.0000 g | Freq: Once | INTRAVENOUS | Status: AC
  Administered 2022-06-17: 2 g via INTRAVENOUS
  Filled 2022-06-17: qty 50

## 2022-06-17 MED ORDER — FLUTICASONE PROPIONATE 50 MCG/ACT NA SUSP: 2.0000 | Freq: Every day | NASAL | Status: DC | PRN

## 2022-06-17 MED ORDER — POTASSIUM CHLORIDE 10 MEQ/100ML IV SOLN
INTRAVENOUS | Status: AC
  Filled 2022-06-17: qty 100

## 2022-06-17 MED ORDER — PROCHLORPERAZINE EDISYLATE 10 MG/2ML IJ SOLN: 10.0000 mg | Freq: Four times a day (QID) | INTRAMUSCULAR | Status: DC | PRN

## 2022-06-17 MED ORDER — METOCLOPRAMIDE HCL 5 MG/ML IJ SOLN
5.0000 mg | Freq: Three times a day (TID) | INTRAMUSCULAR | Status: DC
  Administered 2022-06-18 (×2): 5 mg via INTRAVENOUS
  Filled 2022-06-17 (×2): qty 2

## 2022-06-17 MED ORDER — DIAZEPAM 5 MG/ML IJ SOLN
5.0000 mg | Freq: Once | INTRAMUSCULAR | Status: AC
  Administered 2022-06-17: 5 mg via INTRAMUSCULAR
  Filled 2022-06-17: qty 2

## 2022-06-17 MED ORDER — MORPHINE SULFATE (PF) 4 MG/ML IV SOLN
4.0000 mg | Freq: Once | INTRAVENOUS | Status: AC
  Administered 2022-06-17: 4 mg via INTRAVENOUS
  Filled 2022-06-17: qty 1

## 2022-06-17 MED ORDER — ACETAMINOPHEN 650 MG RE SUPP: 650.0000 mg | Freq: Four times a day (QID) | RECTAL | Status: DC | PRN

## 2022-06-17 MED ORDER — SODIUM CHLORIDE 0.9% FLUSH
3.0000 mL | Freq: Two times a day (BID) | INTRAVENOUS | Status: DC
  Administered 2022-06-17: 3 mL via INTRAVENOUS

## 2022-06-17 MED ORDER — MAGNESIUM SULFATE 2 GM/50ML IV SOLN
2.0000 g | Freq: Once | INTRAVENOUS | Status: AC
  Administered 2022-06-18: 2 g via INTRAVENOUS
  Filled 2022-06-17: qty 50

## 2022-06-17 MED ORDER — INSULIN ASPART 100 UNIT/ML IJ SOLN: 0.0000 [IU] | Freq: Three times a day (TID) | INTRAMUSCULAR | Status: DC

## 2022-06-17 MED ORDER — ENOXAPARIN SODIUM 40 MG/0.4ML IJ SOSY
40.0000 mg | PREFILLED_SYRINGE | INTRAMUSCULAR | Status: DC
  Administered 2022-06-18: 40 mg via SUBCUTANEOUS
  Filled 2022-06-17: qty 0.4

## 2022-06-17 MED ORDER — ONDANSETRON HCL 4 MG/2ML IJ SOLN
4.0000 mg | Freq: Four times a day (QID) | INTRAMUSCULAR | Status: DC | PRN
  Administered 2022-06-18: 4 mg via INTRAVENOUS
  Filled 2022-06-17: qty 2

## 2022-06-17 MED ORDER — LACTATED RINGERS IV BOLUS
1000.0000 mL | Freq: Once | INTRAVENOUS | Status: AC
  Administered 2022-06-17: 1000 mL via INTRAVENOUS

## 2022-06-17 MED ORDER — METOCLOPRAMIDE HCL 5 MG/ML IJ SOLN
10.0000 mg | Freq: Once | INTRAMUSCULAR | Status: AC
  Administered 2022-06-17: 10 mg via INTRAVENOUS
  Filled 2022-06-17: qty 2

## 2022-06-17 MED ORDER — POTASSIUM CHLORIDE 2 MEQ/ML IV SOLN
INTRAVENOUS | Status: DC
  Filled 2022-06-17 (×2): qty 1000

## 2022-06-17 MED ORDER — ACETAMINOPHEN 325 MG PO TABS: 650.0000 mg | ORAL_TABLET | Freq: Four times a day (QID) | ORAL | Status: DC | PRN

## 2022-06-17 MED ORDER — MISOPROSTOL 200 MCG PO TABS
1000.0000 ug | ORAL_TABLET | Freq: Once | ORAL | Status: AC
  Administered 2022-06-18: 1000 ug via VAGINAL
  Filled 2022-06-17 (×2): qty 5

## 2022-06-17 NOTE — ED Triage Notes (Signed)
BIB from [facility] form home for abdominal pain and nausea/vomiting. Pt has an elective abortion one week ago, was seen for these s/s yesterday, with no relief from medications. Last took Zofran one hour ago. Pt is a diabetic, glucose is 64, pt refused oral glucose PTA. 155/101 BP 60 HR 98% r/a 64 cbg

## 2022-06-17 NOTE — ED Provider Notes (Signed)
Care assumed from [facility] [place] at shift change. Please see their note for further information.  Briefly: Patient with hx T1DM status post elective surgical abortion at [facility] on Saturday 1 week ago presents today with nausea, vomiting, and abdominal pain. Hx daily marijuana use. Seen originally 2 days ago and discharged with bentyl and zofran, returns today with continued symptoms. Home glucose readings have been stable, she has an insulin pump.  Plan: concern for cannabinoid hyperemesis vs gastroparesis. Also with hypokalemia at 2.7 receiving IV mag and potassium. Plan for reassessment after this finishes to determine dispo.  After IV K and mag, patient continues to have intractable pain as well as nausea and vomiting despite multiple rounds of pain and nausea meds. She is requesting admission.   I did review the patients TVUS which shows concern for early intrauterine gestational sac. Given she had an abortion 1 week ago, I discussed with OB/Gyn on call Dr. Kennon Rounds who states she will review the ultrasound and drop a note with her recommendations. Plan for hospitalist admission for intractable nausea, vomiting, and pain. Patient understanding and in agreement.   Discussed patient with hospitalist Dr. Bonner Puna who accepts patient for admission.   Nestor Lewandowsky 06/17/22 2112    Leanord Asal K, DO 06/17/22 2253

## 2022-06-17 NOTE — ED Notes (Signed)
Unsuccessful IV attempt x2.  

## 2022-06-17 NOTE — Progress Notes (Signed)
Patient ID: Judy Cook, female   DOB: Feb 21, 2001, 22 y.o.   MRN: [id number]   OB/GYN Telephone Consult  06/17/2022   Judy Cook is a 22 y.o. G0P0000 who is currently pregnant presenting to [facility] ED with persistent N/V following D & C @ [facility] @ 11 weeks.   I was called for a consult regarding the care of this patient by the PA caring for the patient.   The provider had the following clinical question: ? Retained POC, u/s shows possible early Gestational sac  The provider presented the following relevant clinical information: HCG is 3462. U/s imaging reviewed and is possibly c/w retained POC, though formal read is not available.  I performed a chart review on the patient and reviewed available documentation.  BP (!) 153/76   Pulse 82   Temp 98 F (36.7 C) (Oral)   Resp 14   Ht 5' (1.524 m)   Wt 69.1 kg   SpO2 99%   BMI 29.76 kg/m   Exam- performed by consulting provider   Recommendations:  -Will given vaginal Cytotec -bleeding precautions -f/u in our office outpt--will arrange -Check ABO Rh - Rhogam if indicated -If formal in-person consult desired, please call [phone] in am. -Recommended MD/APP provide the patient with a referral to the [facility] (any office) for follow up in  2 weeks.   Thank you for this consult and if additional recommendations are needed please call [phone] for the OB/GYN attending on service at [facility].   I spent approximately 10 minutes directly consulting with the provider and verbally discussing this case. Additionally 10 minutes minutes was spent performing chart review and documentation.    Donnamae Jude, MD

## 2022-06-17 NOTE — Discharge Instructions (Addendum)
You had low potassium today in the emergency department, this was treated with multiple rounds of potassium supplementation.  Drink plenty of fluids, take the Zofran as needed for nausea and vomiting.  The ultrasound showed retained gestational material, you will need to repeat beta-hCG and ultrasound in 14 days, copy of the result is below for your reference.  Return to the ED for fevers, inability eat or drink without vomiting, new or concerning symptoms.   [place] results:    CLINICAL DATA: 22 year old pregnant female with pelvic pain for 3 days. Beta HCG of 3,462  EXAM: OBSTETRIC <14 WK [place] AND TRANSVAGINAL OB US  TECHNIQUE: Both transabdominal and transvaginal ultrasound examinations were performed for complete evaluation of the gestation as well as the maternal uterus, adnexal regions, and pelvic cul-de-sac. Transvaginal technique was performed to assess early pregnancy.  COMPARISON: None Available.  FINDINGS: Intrauterine gestational sac: 1.4 cm hypoechoic structure along the endometrium is indeterminate but could represent an early gestational sac.  Yolk sac: Not Visualized.  Embryo: Not Visualized.  Cardiac Activity: Not Visualized.  Maternal uterus/adnexae: The ovaries bilaterally are unremarkable. No adnexal mass.  No free fluid.  IMPRESSION: 1. Equivocal early intrauterine gestational sac, but no yolk sac, fetal pole, or cardiac activity yet visualized. Recommend follow-up quantitative B-HCG levels and follow-up US in 14 days to confirm and assess viability. This recommendation follows SRU consensus guidelines: Diagnostic Criteria for Nonviable Pregnancy Early in the First Trimester. [place] Med [id number]. 2. Unremarkable ovaries. No adnexal mass or free fluid.

## 2022-06-17 NOTE — H&P (Signed)
History and Physical    Patient: Judy Cook [id number] DOB: Mar 28, 2001 DOA: 06/17/2022 DOS: the patient was seen and examined on 06/17/2022 PCP: Ailene Ards, NP  Patient coming from: Home  Chief Complaint:  Chief Complaint  Patient presents with   Abdominal Pain   HPI: Judy Cook is a 22 y.o. female with a history of T1DM who had an elective abortion at [redacted] weeks gestation on 2/17 and presents to the ED 2/24 with intractable nausea, vomiting, and crampy abdominal pain which has worsened over the past few days. She had some uterine bleeding since the procedure but this has been decreasing. No fevers, chills, urinary complaints. She has been smoking both cigarettes and marijuana but stopped the past couple days because of how bad she felt. No diarrhea.   Work up in the ED shows normoglycemia without acidosis or gap or glucosuria. +ketonuria. WBC 7k. HCG 3,462 and U/S shows equivocal early intrauterine gestational sac, but no yolk sac, fetal pole, or cardiac activity yet visualized. OB/GYN recommends cytotec and outpatient follow up. The patient has still been unable to take nutrition with po challenge, so admission requested for intractable vomiting.  Review of Systems: As mentioned in the history of present illness. All other systems reviewed and are negative. Past Medical History:  Diagnosis Date   Diabetes mellitus ([place])    History reviewed. No pertinent surgical history. Social History:  reports that she has quit smoking. Her smoking use included cigarettes. She has never used smokeless tobacco. She reports current alcohol use. She reports current drug use. Drug: Marijuana.  No Known Allergies  Family History  Problem Relation Age of Onset   Diabetes Paternal Grandfather    Heart attack Paternal Grandfather    Diabetes Paternal Grandmother    Cancer Maternal Grandmother        lung   Alcohol abuse Maternal Grandfather    Alcohol abuse Father    Drug  abuse Father    High Cholesterol Father    Alcohol abuse Mother    Depression Mother    Drug abuse Mother     Prior to Admission medications   Medication Sig Start Date End Date Taking? Authorizing Provider  potassium chloride SA (KLOR-CON M) 20 MEQ tablet Take 1 tablet (20 mEq total) by mouth 2 (two) times daily. 06/17/22  Yes Sherrill Raring, PA-C  ACCU-CHEK GUIDE test strip Use to check your blood sugar 4 times a day (before meals and at bedtime) 06/02/21   Ailene Ards, NP  Accu-Chek Softclix Lancets lancets SMARTSIG:Topical 1-4 Times Daily 01/25/21   [provider]  blood glucose meter kit and supplies Dispense based on patient and insurance preference. Use up to four times daily as directed. (FOR ICD-10 E10.9, E11.9). 01/25/21   Isla Pence, MD  capsicum (ZOSTRIX) 0.075 % topical cream Apply 1 application. topically 2 (two) times daily. 08/07/21   Lynden Oxford Scales, PA-C  Continuous Blood Gluc Sensor (DEXCOM G6 SENSOR) MISC 1 Device by Does not apply route as directed. 07/26/21   Shamleffer, Melanie Crazier, MD  Continuous Blood Gluc Transmit (DEXCOM G6 TRANSMITTER) MISC 1 Device by Does not apply route as directed. 07/26/21   Shamleffer, Melanie Crazier, MD  dicyclomine (BENTYL) 20 MG tablet Take 1 tablet (20 mg total) by mouth 2 (two) times daily. 06/15/22   Leanord Asal K, DO  fluticasone (FLONASE) 50 MCG/ACT nasal spray Place 2 sprays into both nostrils daily. 07/07/21   Ailene Ards, NP  insulin aspart (NOVOLOG) 100  UNIT/ML injection Max Daily 60 units per pump 06/08/22   Shamleffer, Melanie Crazier, MD  norgestimate-ethinyl estradiol (ORTHO-CYCLEN) 0.25-35 MG-MCG tablet Take 1 tablet by mouth daily. 06/24/21   Luvenia Redden, PA-C  ondansetron (ZOFRAN) 4 MG tablet Take 1 tablet (4 mg total) by mouth every 6 (six) hours. 06/15/22   Leanord Asal K, DO  ondansetron (ZOFRAN-ODT) 8 MG disintegrating tablet Take 1 tablet (8 mg total) by mouth every 8 (eight) hours as needed  for nausea or vomiting. 06/17/22   Sherrill Raring, PA-C  pantoprazole (PROTONIX) 40 MG tablet Take 1 tablet (40 mg total) by mouth daily. 12/07/19 01/06/20  Antonieta Pert, MD  potassium chloride 20 MEQ TBCR Take 20 mEq by mouth daily for 7 doses. 12/07/19 12/14/19  Antonieta Pert, MD    Physical Exam: Vitals:   06/17/22 1430 06/17/22 1830 06/17/22 1839 06/17/22 1932  BP: (!) 145/83 (!) 153/76    Pulse: 66 82    Resp: 14 14    Temp:   98 F (36.7 C)   TempSrc:   Oral   SpO2: 100% 99%    Weight:    69.1 kg  Height:    5' (1.524 m)  Gen: No distress Pulm: Clear, nonlabored  CV: RRR, no MRG or edema GI: Soft, mildly diffusely tender without rebound or guarding.  Neuro: Alert and oriented. No new focal deficits. Ext: Warm, no deformities Skin: No rashes, lesions or ulcers on visualized skin   Data Reviewed: K 2.7, CMP otherwise normal including SCr 0.85, lipase 29 WBC 7k, hgb 14.2g/dl plt 461k. Beta HCG serum 3,462.  UA w/ketones, no glucose, +protein. W/dirty catch WBC 6-10/HPF. No bacteriuria   Assessment and Plan: Intractable nausea and vomiting: Failed po challenge in ED. Suspect cannabinoid hyperemesis, cyclic vomiting, gastroparesis (reported history of all these)  - Continue IVF - CLD for now - Continue IV antiemetics (reglan scheduled and prn zofran, prn compazine if refractory)  Retained POC s/p abortion procedure: See pelvic U/S showing concern for gestational sac with no definitive yolk sac or cardiac activity.  - OB/GYN phone consulted, Give cytotec and follow up.  - Check ABO Rh and give Rhogam if indicated.  - If formal evaluation requested, can call [phone]  T1DM: Dx age 66, no recent admissions for DKA. Insulin pump initiated last year and HbA1c has improved to 7.3% (as high as 12% in the past  Marijuana and tobacco use:  - Cessation counseling for both provided at admission   Advance Care Planning: Full  Consults: OB/GYN, Dr. Kennon Rounds, by phone only. If formal consultation  is required, will need to call (see note)  Family Communication: None at bedside  Severity of Illness: The appropriate patient status for this patient is OBSERVATION. Observation status is judged to be reasonable and necessary in order to provide the required intensity of service to ensure the patient's safety. The patient's presenting symptoms, physical exam findings, and initial radiographic and laboratory data in the context of their medical condition is felt to place them at decreased risk for further clinical deterioration. Furthermore, it is anticipated that the patient will be medically stable for discharge from the hospital within 2 midnights of admission.   Author: Patrecia Pour, MD 06/17/2022 9:36 PM  For on call review [email or web address].

## 2022-06-17 NOTE — ED Notes (Signed)
ED TO INPATIENT HANDOFF REPORT  ED Nurse Name and Phone #: Alroy Bailiff Name/Age/Gender Judy Cook 22 y.o. female Room/Bed: WA08/WA08  Code Status   Code Status: Full Code  Home/SNF/Other Home Patient oriented to: self, place, time, and situation Is this baseline? Yes   Triage Complete: Triage complete  Chief Complaint Intractable nausea and vomiting [R11.2]  Triage Note BIB from [facility] form home for abdominal pain and nausea/vomiting. Pt has an elective abortion one week ago, was seen for these s/s yesterday, with no relief from medications. Last took Zofran one hour ago. Pt is a diabetic, glucose is 64, pt refused oral glucose PTA. 155/101 BP 60 HR 98% r/a 64 cbg    Allergies No Known Allergies  Level of Care/Admitting Diagnosis ED Disposition     ED Disposition  Admit   Condition  --   Comment  Hospital Area: [facility] [[id number]]  Level of Care: Med-Surg [16]  May place patient in observation at [facility] or [facility] if equivalent level of care is available:: Yes  Covid Evaluation: Asymptomatic - no recent exposure (last 10 days) testing not required  Diagnosis: Intractable nausea and vomiting [id number]  Admitting Physician: Patrecia Pour [id number]  Attending Physician: Patrecia Pour [phone]          B Medical/Surgery History Past Medical History:  Diagnosis Date   Diabetes mellitus ([place])    History reviewed. No pertinent surgical history.   A IV Location/Drains/Wounds Patient Lines/Drains/Airways Status     Active Line/Drains/Airways     Name Placement date Placement time Site Days   Peripheral IV 06/17/22 22 G Left;Posterior Hand 06/17/22  1808  Hand  less than 1   Peripheral IV 06/17/22 18 G 2.5" Anterior;Left;Upper Arm 06/17/22  1939  Arm  less than 1            Intake/Output Last 24 hours  Intake/Output Summary (Last 24 hours) at 06/17/2022 2240 Last data filed at 06/17/2022 1947 Gross per 24 hour   Intake 582.9 ml  Output --  Net 582.9 ml    Labs/Imaging Results for orders placed or performed during the hospital encounter of 06/17/22 (from the past 48 hour(s))  CBC with Differential     Status: Abnormal   Collection Time: 06/17/22  1:51 PM  Result Value Ref Range   WBC 7.0 4.0 - 10.5 K/uL   RBC 4.50 3.87 - 5.11 MIL/uL   Hemoglobin 14.2 12.0 - 15.0 g/dL   HCT 41.7 36.0 - 46.0 %   MCV 92.7 80.0 - 100.0 fL   MCH 31.6 26.0 - 34.0 pg   MCHC 34.1 30.0 - 36.0 g/dL   RDW 13.1 11.5 - 15.5 %   Platelets 461 (H) 150 - 400 K/uL   nRBC 0.0 0.0 - 0.2 %   Neutrophils Relative % 56 %   Neutro Abs 3.9 1.7 - 7.7 K/uL   Lymphocytes Relative 34 %   Lymphs Abs 2.4 0.7 - 4.0 K/uL   Monocytes Relative 9 %   Monocytes Absolute 0.6 0.1 - 1.0 K/uL   Eosinophils Relative 0 %   Eosinophils Absolute 0.0 0.0 - 0.5 K/uL   Basophils Relative 1 %   Basophils Absolute 0.1 0.0 - 0.1 K/uL   WBC Morphology VACUOLATED NEUTROPHILS    Immature Granulocytes 0 %   Abs Immature Granulocytes 0.03 0.00 - 0.07 K/uL    Comment: Performed at [facility], [place] [address], [place], [place] [address]  Comprehensive metabolic panel     Status: Abnormal   Collection Time: 06/17/22  1:51 PM  Result Value Ref Range   Sodium 138 135 - 145 mmol/L   Potassium 2.7 (LL) 3.5 - 5.1 mmol/L    Comment: CRITICAL RESULT CALLED TO, READ BACK BY AND VERIFIED WITH LEWIS, M. RN AT 1501 ON 06/17/2022 BY MECIAL J.    Chloride 101 98 - 111 mmol/L   CO2 23 22 - 32 mmol/L   Glucose, Bld 70 70 - 99 mg/dL    Comment: Glucose reference range applies only to samples taken after fasting for at least 8 hours.   BUN 7 6 - 20 mg/dL   Creatinine, Ser 0.85 0.44 - 1.00 mg/dL   Calcium 9.0 8.9 - 10.3 mg/dL   Total Protein 7.5 6.5 - 8.1 g/dL   Albumin 4.1 3.5 - 5.0 g/dL   AST 37 15 - 41 U/L   ALT 37 0 - 44 U/L   Alkaline Phosphatase 65 38 - 126 U/L   Total Bilirubin 0.5 0.3 - 1.2 mg/dL   GFR, Estimated >60 >60 mL/min     Comment: (NOTE) Calculated using the CKD-EPI Creatinine Equation (2021)    Anion gap 14 5 - 15    Comment: Performed at [facility], [place] [address], [place], [place] [address]  Lipase, blood     Status: None   Collection Time: 06/17/22  1:51 PM  Result Value Ref Range   Lipase 29 11 - 51 U/L    Comment: Performed at [facility], [place] [address], [place], [place] [address]  hCG, quantitative, pregnancy     Status: Abnormal   Collection Time: 06/17/22  1:54 PM  Result Value Ref Range   hCG, Beta Chain, Quant, S 3,462 (H) <5 mIU/mL    Comment:          GEST. AGE      CONC.  (mIU/mL)   <=1 WEEK        5 - 50     2 WEEKS       50 - 500     3 WEEKS       100 - 10,000     4 WEEKS     1,000 - 30,000     5 WEEKS     3,500 - 115,000   6-8 WEEKS     12,000 - 270,000    12 WEEKS     15,000 - 220,000        FEMALE AND NON-PREGNANT FEMALE:     LESS THAN 5 mIU/mL Performed at [facility], [place] [address] [place], [place] [address]   POC CBG, ED     Status: None   Collection Time: 06/17/22  2:45 PM  Result Value Ref Range   Glucose-Capillary 75 70 - 99 mg/dL    Comment: Glucose reference range applies only to samples taken after fasting for at least 8 hours.  Urinalysis, Routine w reflex microscopic -Urine, Clean Catch     Status: Abnormal   Collection Time: 06/17/22  4:00 PM  Result Value Ref Range   Color, Urine YELLOW YELLOW   APPearance CLEAR CLEAR   Specific Gravity, Urine 1.016 1.005 - 1.030   pH 8.0 5.0 - 8.0   Glucose, UA NEGATIVE NEGATIVE mg/dL   Hgb urine dipstick NEGATIVE NEGATIVE   Bilirubin Urine NEGATIVE NEGATIVE   Ketones, ur 20 (A) NEGATIVE mg/dL   Protein, ur >=300 (A) NEGATIVE  mg/dL   Nitrite NEGATIVE NEGATIVE   Leukocytes,Ua NEGATIVE NEGATIVE   RBC / HPF 0-5 0 - 5 RBC/hpf   WBC, UA 6-10 0 - 5 WBC/hpf   Bacteria, UA NONE SEEN NONE SEEN   Squamous Epithelial / HPF 6-10 0 - 5 /HPF   Mucus PRESENT     Comment:  Performed at [facility], [place] [address], [place], [place] [address]  ABO/Rh     Status: None   Collection Time: 06/17/22  9:45 PM  Result Value Ref Range   ABO/RH(D)      Jenetta Downer NEG Performed at [facility] [address] [place], [place] [address]    US OB Comp < 14 Wks  Result Date: 06/17/2022 CLINICAL DATA:  22 year old pregnant female with pelvic pain for 3 days. Beta HCG of 3,462 EXAM: OBSTETRIC <14 WK [place] AND TRANSVAGINAL OB US TECHNIQUE: Both transabdominal and transvaginal ultrasound examinations were performed for complete evaluation of the gestation as well as the maternal uterus, adnexal regions, and pelvic cul-de-sac. Transvaginal technique was performed to assess early pregnancy. COMPARISON:  None Available. FINDINGS: Intrauterine gestational sac: 1.4 cm hypoechoic structure along the endometrium is indeterminate but could represent an early gestational sac. Yolk sac:  Not Visualized. Embryo:  Not Visualized. Cardiac Activity: Not Visualized. Maternal uterus/adnexae: The ovaries bilaterally are unremarkable. No adnexal mass. No free fluid. IMPRESSION: 1. Equivocal early intrauterine gestational sac, but no yolk sac, fetal pole, or cardiac activity yet visualized. Recommend follow-up quantitative B-HCG levels and follow-up US in 14 days to confirm and assess viability. This recommendation follows SRU consensus guidelines: Diagnostic Criteria for Nonviable Pregnancy Early in the First Trimester. [place] Med [id number]. 2. Unremarkable ovaries.  No adnexal mass or free fluid. Electronically Signed   By: Margarette Canada M.D.   On: 06/17/2022 16:53   US OB Transvaginal  Result Date: 06/17/2022 CLINICAL DATA:  22 year old pregnant female with pelvic pain for 3 days. Beta HCG of 3,462 EXAM: OBSTETRIC <14 WK [place] AND TRANSVAGINAL OB US TECHNIQUE: Both transabdominal and transvaginal ultrasound examinations were performed for complete evaluation of the  gestation as well as the maternal uterus, adnexal regions, and pelvic cul-de-sac. Transvaginal technique was performed to assess early pregnancy. COMPARISON:  None Available. FINDINGS: Intrauterine gestational sac: 1.4 cm hypoechoic structure along the endometrium is indeterminate but could represent an early gestational sac. Yolk sac:  Not Visualized. Embryo:  Not Visualized. Cardiac Activity: Not Visualized. Maternal uterus/adnexae: The ovaries bilaterally are unremarkable. No adnexal mass. No free fluid. IMPRESSION: 1. Equivocal early intrauterine gestational sac, but no yolk sac, fetal pole, or cardiac activity yet visualized. Recommend follow-up quantitative B-HCG levels and follow-up US in 14 days to confirm and assess viability. This recommendation follows SRU consensus guidelines: Diagnostic Criteria for Nonviable Pregnancy Early in the First Trimester. [place] [employer] Med [id number]. 2. Unremarkable ovaries.  No adnexal mass or free fluid. Electronically Signed   By: Margarette Canada M.D.   On: 06/17/2022 16:53    Pending Labs [employer] (From admission, onward)     Start     Ordered   Signed and Held  Magnesium  Tomorrow morning,   R        Signed and Held   Signed and Occupational hygienist morning,   R        Signed and Held   Signed and Held  CBC  Tomorrow morning,   R  Signed and Held   Signed and Held  Creatinine, serum  (enoxaparin (LOVENOX)    CrCl >/= 30 ml/min)  Weekly,   R     Comments: while on enoxaparin therapy    Signed and Held            Vitals/Pain Today's Vitals   06/17/22 1839 06/17/22 1932 06/17/22 2111 06/17/22 2228  BP:      Pulse:      Resp:      Temp: 98 F (36.7 C)   98 F (36.7 C)  TempSrc: Oral   Oral  SpO2:      Weight:  69.1 kg    Height:  5' (1.524 m)    PainSc:   Asleep     Isolation Precautions No active isolations  Medications Medications  potassium chloride 10 mEq in 100 mL IVPB (10 mEq Intravenous New  Bag/Given 06/17/22 2147)  misoprostol (CYTOTEC) tablet 1,000 mcg (has no administration in time range)  potassium chloride 10 MEQ/100ML IVPB (  Not Given 06/17/22 2147)  lactated ringers bolus 1,000 mL (0 mLs Intravenous Stopped 06/17/22 1710)  droperidol (INAPSINE) 2.5 MG/ML injection 2.5 mg (2.5 mg Intravenous Given 06/17/22 1644)  diazepam (VALIUM) injection 5 mg (5 mg Intramuscular Given 06/17/22 1459)  magnesium sulfate IVPB 2 g 50 mL (0 g Intravenous Stopped 06/17/22 1946)  metoCLOPramide (REGLAN) injection 10 mg (10 mg Intravenous Given 06/17/22 1847)  ketorolac (TORADOL) 15 MG/ML injection 15 mg (15 mg Intravenous Given 06/17/22 1850)  morphine (PF) 4 MG/ML injection 4 mg (4 mg Intravenous Given 06/17/22 2047)    Mobility walks     Focused Assessments    R Recommendations: See Admitting Provider Note  Report given to:   Additional Notes:

## 2022-06-17 NOTE — ED Notes (Signed)
[place] IV access attempted

## 2022-06-17 NOTE — ED Notes (Signed)
IV Team RN unable to get access at this time

## 2022-06-17 NOTE — ED Notes (Signed)
EJ infiltrated, all infusions stopped at this time

## 2022-06-17 NOTE — ED Provider Notes (Signed)
[place] Provider Note   CSN: [id number] Arrival date & time: 06/17/22  1342     History {Add pertinent medical, surgical, social history, OB history to HPI:1} Chief Complaint  Patient presents with   Abdominal Pain    Judy Cook is a 22 y.o. female.   Abdominal Pain    Patient with a history of type 1 diabetes, cannabis hyperemesis, recent surgical abortion 11 weeks 1 week prior to today presents to the emergency department due to generalized abdominal pain, nausea and vomiting x 3 days.  Seen in the ED for a 2 days ago, initially felt better after ED evaluation but it is returned and been worse.  Endorses marijuana use 4 days ago but in the last 3 days, the pain is generalized across her abdomen.  She has having vaginal bleeding states has been going on since the abortion.  There is some associated cramping with it, no dysuria or hematuria.  No flank pain.  Requesting IV pain medicine.  Denies any previous abdominal surgeries.  Home Medications Prior to Admission medications   Medication Sig Start Date End Date Taking? Authorizing Provider  ACCU-CHEK GUIDE test strip Use to check your blood sugar 4 times a day (before meals and at bedtime) 06/02/21   Ailene Ards, NP  Accu-Chek Softclix Lancets lancets SMARTSIG:Topical 1-4 Times Daily 01/25/21   [provider]  blood glucose meter kit and supplies Dispense based on patient and insurance preference. Use up to four times daily as directed. (FOR ICD-10 E10.9, E11.9). 01/25/21   Isla Pence, MD  capsicum (ZOSTRIX) 0.075 % topical cream Apply 1 application. topically 2 (two) times daily. 08/07/21   Lynden Oxford Scales, PA-C  Continuous Blood Gluc Sensor (DEXCOM G6 SENSOR) MISC 1 Device by Does not apply route as directed. 07/26/21   Shamleffer, Melanie Crazier, MD  Continuous Blood Gluc Transmit (DEXCOM G6 TRANSMITTER) MISC 1 Device by Does not apply route as directed.  07/26/21   Shamleffer, Melanie Crazier, MD  dicyclomine (BENTYL) 20 MG tablet Take 1 tablet (20 mg total) by mouth 2 (two) times daily. 06/15/22   Leanord Asal K, DO  fluticasone (FLONASE) 50 MCG/ACT nasal spray Place 2 sprays into both nostrils daily. 07/07/21   Ailene Ards, NP  insulin aspart (NOVOLOG) 100 UNIT/ML injection Max Daily 60 units per pump 06/08/22   Shamleffer, Melanie Crazier, MD  norgestimate-ethinyl estradiol (ORTHO-CYCLEN) 0.25-35 MG-MCG tablet Take 1 tablet by mouth daily. 06/24/21   Luvenia Redden, PA-C  ondansetron (ZOFRAN) 4 MG tablet Take 1 tablet (4 mg total) by mouth every 6 (six) hours. 06/15/22   Leanord Asal K, DO  pantoprazole (PROTONIX) 40 MG tablet Take 1 tablet (40 mg total) by mouth daily. 12/07/19 01/06/20  Antonieta Pert, MD  potassium chloride 20 MEQ TBCR Take 20 mEq by mouth daily for 7 doses. 12/07/19 12/14/19  Antonieta Pert, MD      Allergies    Patient has no known allergies.    Review of Systems   Review of Systems  Gastrointestinal:  Positive for abdominal pain.    Physical Exam Updated Vital Signs BP 132/85   Pulse (!) 58   Temp 98 F (36.7 C) (Oral)   Resp 19   SpO2 100%  Physical Exam Vitals and nursing note reviewed. Exam conducted with a chaperone present.  Constitutional:      Appearance: Normal appearance.  HENT:     Head: Normocephalic and atraumatic.  Eyes:  General: No scleral icterus.       Right eye: No discharge.        Left eye: No discharge.     Extraocular Movements: Extraocular movements intact.     Pupils: Pupils are equal, round, and reactive to light.  Cardiovascular:     Rate and Rhythm: Normal rate and regular rhythm.     Pulses: Normal pulses.     Heart sounds: Normal heart sounds. No murmur heard.    No friction rub. No gallop.  Pulmonary:     Effort: Pulmonary effort is normal. No respiratory distress.     Breath sounds: Normal breath sounds.  Abdominal:     General: Abdomen is flat. Bowel sounds are  normal. There is no distension.     Palpations: Abdomen is soft.     Tenderness: There is generalized abdominal tenderness.  Skin:    General: Skin is warm and dry.     Coloration: Skin is not jaundiced.  Neurological:     Mental Status: She is alert. Mental status is at baseline.     Coordination: Coordination normal.     ED Results / Procedures / Treatments   Labs (all labs ordered are listed, but only abnormal results are displayed) Labs Reviewed  CBC WITH DIFFERENTIAL/PLATELET  COMPREHENSIVE METABOLIC PANEL  LIPASE, BLOOD  URINALYSIS, ROUTINE W REFLEX MICROSCOPIC  HCG, QUANTITATIVE, PREGNANCY  CBG MONITORING, ED    EKG None  Radiology No results found.  Procedures .Critical Care  Performed by: Sherrill Raring, PA-C Authorized by: Sherrill Raring, PA-C   Critical care provider statement:    Critical care time (minutes):  30   Critical care start time:  06/17/2022 4:30 PM   Critical care end time:  06/17/2022 5:00 PM   Critical care was necessary to treat or prevent imminent or life-threatening deterioration of the following conditions:  Metabolic crisis   Critical care was time spent personally by me on the following activities:  Development of treatment plan with patient or surrogate, discussions with consultants, evaluation of patient's response to treatment, examination of patient, ordering and review of laboratory studies, ordering and review of radiographic studies, ordering and performing treatments and interventions, pulse oximetry, re-evaluation of patient's condition and review of old charts   {Document cardiac monitor, telemetry assessment procedure when appropriate:1}  Medications Ordered in ED Medications  lactated ringers bolus 1,000 mL (has no administration in time range)  droperidol (INAPSINE) 2.5 MG/ML injection 2.5 mg (has no administration in time range)    ED Course/ Medical Decision Making/ A&P Clinical Course as of 06/17/22 1659  Sat Jun 17, 2022   1505 Patient has significant hypokalemia with a potassium of 2.7 grossly decreasing 2 days ago at 3.2, will replenish empirically with potassium supplementation and empiric magnesium.   [HS]  1656 US OB Comp < 14 Wks  IMPRESSION: 1. Equivocal early intrauterine gestational sac, but no yolk sac, fetal pole, or cardiac activity yet visualized. Recommend follow-up quantitative B-HCG levels and follow-up US in 14 days to confirm and assess viability. This recommendation follows SRU consensus guidelines: Diagnostic Criteria for Nonviable Pregnancy Early in the First Trimester. [place] Med [id number]. 2. Unremarkable ovaries. No adnexal mass or free fluid.   [HS]    Clinical Course User Index [HS] Sherrill Raring, PA-C   {   Click here for ABCD2, HEART and other calculatorsREFRESH Note before signing :1}  Medical Decision Making Amount and/or Complexity of Data Reviewed Labs: ordered. Radiology: ordered. Decision-making details documented in ED Course.  Risk Prescription drug management.   Patient presents to the emergency department due to abdominal pain, nausea and vomiting.  Differential includes but not limited to AKI, electrolyte derangement, dehydration, DKA, HHS, intra-abdominal process, UTI, retained products of conception, septic.  Patient is generalized tenderness on evaluation but abdomen is soft without any rigidity or guarding.  Other, viewed and interpreted laboratory workup.  No AKI, DKA or HHS.  Patient is notably hypokalemic with a potassium of 2.7, will replenish and empirically replenished with magnesium.  Fluids, droperidol, Valium were ordered for antiemetics and patient is feeling somewhat improved.  Pelvic ultrasound shows possible material in the gestational sac, equivocal findings without cardiac activity.  Recommendations are for hCG trending in the outpatient setting.  I discussed these recommendations and the results with the  patient verbalized understanding.  Patient is a type I diabetic who has been having intractable nausea and vomiting for the past few days, she appears to be improved with droperidol we will proceed with IV supplementation potassium.  If she is still feeling improved and tolerating p.o. at the end I suspect discharge home would be appropriate.  If still unwell with emesis after p.o. challenge will likely need admission.  {Document critical care time when appropriate:1} {Document review of labs and clinical decision tools ie heart score, Chads2Vasc2 etc:1}  {Document your independent review of radiology images, and any outside records:1} {Document your discussion with family members, caretakers, and with consultants:1} {Document social determinants of health affecting pt's care:1} {Document your decision making why or why not admission, treatments were needed:1} Final Clinical Impression(s) / ED Diagnoses Final diagnoses:  None    Rx / DC Orders ED Discharge Orders     None

## 2022-06-18 ENCOUNTER — Other Ambulatory Visit: Payer: Self-pay | Admitting: Internal Medicine

## 2022-06-18 DIAGNOSIS — O034 Incomplete spontaneous abortion without complication: Secondary | ICD-10-CM | POA: Insufficient documentation

## 2022-06-18 DIAGNOSIS — R112 Nausea with vomiting, unspecified: Secondary | ICD-10-CM | POA: Diagnosis not present

## 2022-06-18 LAB — CBC
HCT: 37 % (ref 36.0–46.0)
Hemoglobin: 12.6 g/dL (ref 12.0–15.0)
MCH: 31.7 pg (ref 26.0–34.0)
MCHC: 34.1 g/dL (ref 30.0–36.0)
MCV: 93 fL (ref 80.0–100.0)
Platelets: 367 10*3/uL (ref 150–400)
RBC: 3.98 MIL/uL (ref 3.87–5.11)
RDW: 13.1 % (ref 11.5–15.5)
WBC: 10.2 10*3/uL (ref 4.0–10.5)
nRBC: 0 % (ref 0.0–0.2)

## 2022-06-18 LAB — MAGNESIUM: Magnesium: 2.7 mg/dL — ABNORMAL HIGH (ref 1.7–2.4)

## 2022-06-18 LAB — BASIC METABOLIC PANEL
Anion gap: 12 (ref 5–15)
BUN: 8 mg/dL (ref 6–20)
CO2: 21 mmol/L — ABNORMAL LOW (ref 22–32)
Calcium: 8.4 mg/dL — ABNORMAL LOW (ref 8.9–10.3)
Chloride: 100 mmol/L (ref 98–111)
Creatinine, Ser: 0.69 mg/dL (ref 0.44–1.00)
GFR, Estimated: >60 mL/min (ref >60)
Glucose, Bld: 141 mg/dL — ABNORMAL HIGH (ref 70–99)
Potassium: 3 mmol/L — ABNORMAL LOW (ref 3.5–5.1)
Sodium: 133 mmol/L — ABNORMAL LOW (ref 135–145)

## 2022-06-18 LAB — GLUCOSE, CAPILLARY
Glucose-Capillary: 102 mg/dL — ABNORMAL HIGH (ref 70–99)
Glucose-Capillary: 193 mg/dL — ABNORMAL HIGH (ref 70–99)

## 2022-06-18 MED ORDER — ONDANSETRON HCL 4 MG PO TABS: 4.0000 mg | ORAL_TABLET | Freq: Four times a day (QID) | ORAL | 0 refills | Status: DC

## 2022-06-18 MED ORDER — INFLUENZA VAC SPLIT QUAD 0.5 ML IM SUSY: 0.5000 mL | PREFILLED_SYRINGE | INTRAMUSCULAR | Status: DC

## 2022-06-18 MED ORDER — POTASSIUM CHLORIDE CRYS ER 20 MEQ PO TBCR
40.0000 meq | EXTENDED_RELEASE_TABLET | Freq: Once | ORAL | Status: AC
  Administered 2022-06-18: 40 meq via ORAL
  Filled 2022-06-18: qty 2

## 2022-06-18 NOTE — TOC Progression Note (Signed)
Transition of Care [facility]) - Progression Note    Patient Details  Name: Judy Cook MRN: [id number] Date of Birth: February 28, 2001  Transition of Care [facility]) CM/SW Contact  Servando Snare, [place] Phone Number: 06/18/2022, 9:56 AM  Clinical Narrative:    Transition of Care (TOC) Screening Note   Patient Details  Name: Judy Cook Date of Birth: 05-12-2000   [facility] of [facility]) CM/SW Contact:    Servando Snare, LCSW Phone Number: 06/18/2022, 9:56 AM    Transition of Care Department [facility]) has reviewed patient and no TOC needs have been identified at this time. We will continue to monitor patient advancement through interdisciplinary progression rounds. If new patient transition needs arise, please place a TOC consult.           Expected Discharge Plan and Services                                               Social Determinants of Health (SDOH) Interventions SDOH Screenings   Food Insecurity: Unknown (06/18/2022)  Housing: Low Risk  (06/18/2022)  Transportation Needs: No Transportation Needs (06/18/2022)  Utilities: Not At Risk (06/18/2022)  Depression (PHQ2-9): Low Risk  (01/12/2022)  Tobacco Use: Medium Risk (06/17/2022)    Readmission Risk Interventions    12/05/2019   12:24 PM  Readmission Risk Prevention Plan  Post Dischage Appt Complete  Medication Screening Complete  Transportation Screening Complete

## 2022-06-18 NOTE — Discharge Summary (Signed)
Physician Discharge Summary  Judy Cook [id number] DOB: [birth year] DOA: 06/17/2022  PCP: Ailene Ards, NP  Admit date: 06/17/2022 Discharge date: 06/18/2022  Admitted From: Home Disposition:  Home   Recommendations for Outpatient Follow-up:  Follow up with OB GYN   Discharge Condition: Stable CODE STATUS: Full  Diet recommendation: Carb modified   Brief/Interim Summary: From H&P by Dr. Bonner Puna: "Judy Cook is a 22 y.o. female with a history of T1DM who had an elective abortion at [redacted] weeks gestation on 2/17 and presents to the ED 2/24 with intractable nausea, vomiting, and crampy abdominal pain which has worsened over the past few days. She had some uterine bleeding since the procedure but this has been decreasing. No fevers, chills, urinary complaints. She has been smoking both cigarettes and marijuana but stopped the past couple days because of how bad she felt. No diarrhea.    Work up in the ED shows normoglycemia without acidosis or gap or glucosuria. +ketonuria. WBC 7k. HCG 3,462 and U/S shows equivocal early intrauterine gestational sac, but no yolk sac, fetal pole, or cardiac activity yet visualized. OB/GYN recommends cytotec and outpatient follow up. The patient has still been unable to take nutrition with po challenge, so admission requested for intractable vomiting."  Patient's symptoms of nausea and vomiting resolved. Diet was advanced. She had light vaginal bleeding, but no abdominal pain or cramping. She was ordered to received cytotec vaginally prior to discharge with close OBGYN follow up.   Discharge Diagnoses:   Principal Problem:   Intractable nausea and vomiting Active Problems:   Marijuana use   Cyclical vomiting   Type 1 diabetes mellitus without complication (HCC)   Hypokalemia   Retained products of conception following abortion     Discharge Instructions  Discharge Instructions     Call MD for:  difficulty breathing, headache or  visual disturbances   Complete by: As directed    Call MD for:  extreme fatigue   Complete by: As directed    Call MD for:  persistant dizziness or light-headedness   Complete by: As directed    Call MD for:  persistant nausea and vomiting   Complete by: As directed    Call MD for:  severe uncontrolled pain   Complete by: As directed    Call MD for:  temperature >100.4   Complete by: As directed    Diet Carb Modified   Complete by: As directed    Discharge instructions   Complete by: As directed    You were cared for by a hospitalist during your hospital stay. If you have any questions about your discharge medications or the care you received while you were in the hospital after you are discharged, you can call the unit and ask to speak with the hospitalist on call if the hospitalist that took care of you is not available. Once you are discharged, your primary care physician will handle any further medical issues. Please note that NO REFILLS for any discharge medications will be authorized once you are discharged, as it is imperative that you return to your primary care physician (or establish a relationship with a primary care physician if you do not have one) for your aftercare needs so that they can reassess your need for medications and monitor your lab values.   Increase activity slowly   Complete by: As directed       Allergies as of 06/18/2022   No Known Allergies      Medication List  STOP taking these medications    Potassium Chloride ER 20 MEQ Tbcr       TAKE these medications    Accu-Chek Guide test strip Generic drug: glucose blood Use to check your blood sugar 4 times a day (before meals and at bedtime)   Accu-Chek Softclix Lancets lancets SMARTSIG:Topical 1-4 Times Daily   blood glucose meter kit and supplies Dispense based on patient and insurance preference. Use up to four times daily as directed. (FOR ICD-10 E10.9, E11.9).   capsicum 0.075 % topical  cream Commonly known as: ZOSTRIX Apply 1 application. topically 2 (two) times daily.   Dexcom G6 Sensor Misc 1 Device by Does not apply route as directed.   Dexcom G6 Transmitter Misc 1 Device by Does not apply route as directed.   dicyclomine 20 MG tablet Commonly known as: BENTYL Take 1 tablet (20 mg total) by mouth 2 (two) times daily.   fluticasone 50 MCG/ACT nasal spray Commonly known as: FLONASE Place 2 sprays into both nostrils daily.   insulin aspart 100 UNIT/ML injection Commonly known as: NovoLOG Max Daily 60 units per pump   norgestimate-ethinyl estradiol 0.25-35 MG-MCG tablet Commonly known as: ORTHO-CYCLEN Take 1 tablet by mouth daily.   ondansetron 4 MG tablet Commonly known as: ZOFRAN Take 1 tablet (4 mg total) by mouth every 6 (six) hours.   ondansetron 8 MG disintegrating tablet Commonly known as: ZOFRAN-ODT Take 1 tablet (8 mg total) by mouth every 8 (eight) hours as needed for nausea or vomiting.   pantoprazole 40 MG tablet Commonly known as: Protonix Take 1 tablet (40 mg total) by mouth daily.        Follow-up [place] [facility] Follow up.   Specialty: Obstetrics and Gynecology Contact information: [id number]  [place] [phone]               No Known Allergies  Consultations: GYN    Procedures/Studies: US OB Comp < 14 Wks  Result Date: 06/17/2022 CLINICAL DATA:  22 year old pregnant female with pelvic pain for 3 days. Beta HCG of 3,462 EXAM: OBSTETRIC <14 WK [place] AND TRANSVAGINAL OB US TECHNIQUE: Both transabdominal and transvaginal ultrasound examinations were performed for complete evaluation of the gestation as well as the maternal uterus, adnexal regions, and pelvic cul-de-sac. Transvaginal technique was performed to assess early pregnancy. COMPARISON:  None Available. FINDINGS: Intrauterine gestational sac: 1.4 cm hypoechoic  structure along the endometrium is indeterminate but could represent an early gestational sac. Yolk sac:  Not Visualized. Embryo:  Not Visualized. Cardiac Activity: Not Visualized. Maternal uterus/adnexae: The ovaries bilaterally are unremarkable. No adnexal mass. No free fluid. IMPRESSION: 1. Equivocal early intrauterine gestational sac, but no yolk sac, fetal pole, or cardiac activity yet visualized. Recommend follow-up quantitative B-HCG levels and follow-up US in 14 days to confirm and assess viability. This recommendation follows SRU consensus guidelines: Diagnostic Criteria for Nonviable Pregnancy Early in the First Trimester. [place] [employer] Med [id number]. 2. Unremarkable ovaries.  No adnexal mass or free fluid. Electronically Signed   By: Margarette Canada M.D.   On: 06/17/2022 16:53   US OB Transvaginal  Result Date: 06/17/2022 CLINICAL DATA:  22 year old pregnant female with pelvic pain for 3 days. Beta HCG of 3,462 EXAM: OBSTETRIC <14 WK [place] AND TRANSVAGINAL OB US TECHNIQUE: Both transabdominal and transvaginal ultrasound examinations were performed for complete evaluation of the gestation as well as the maternal uterus, adnexal  regions, and pelvic cul-de-sac. Transvaginal technique was performed to assess early pregnancy. COMPARISON:  None Available. FINDINGS: Intrauterine gestational sac: 1.4 cm hypoechoic structure along the endometrium is indeterminate but could represent an early gestational sac. Yolk sac:  Not Visualized. Embryo:  Not Visualized. Cardiac Activity: Not Visualized. Maternal uterus/adnexae: The ovaries bilaterally are unremarkable. No adnexal mass. No free fluid. IMPRESSION: 1. Equivocal early intrauterine gestational sac, but no yolk sac, fetal pole, or cardiac activity yet visualized. Recommend follow-up quantitative B-HCG levels and follow-up US in 14 days to confirm and assess viability. This recommendation follows SRU consensus guidelines: Diagnostic Criteria for Nonviable Pregnancy  Early in the First Trimester. [place] [employer] Med [id number]. 2. Unremarkable ovaries.  No adnexal mass or free fluid. Electronically Signed   By: Margarette Canada M.D.   On: 06/17/2022 16:53       Discharge Exam: Vitals:   06/18/22 0401 06/18/22 0734  BP: 131/87 (!) 123/93  Pulse: 75 85  Resp: 18 16  Temp: 99.2 F (37.3 C) 98.5 F (36.9 C)  SpO2: 99% 100%    General: Pt is alert, awake, not in acute distress Cardiovascular: RRR, S1/S2 +, no edema Respiratory: CTA bilaterally, no wheezing, no rhonchi, no respiratory distress, no conversational dyspnea  Abdominal: Soft, NT, ND, bowel sounds + Extremities: no edema, no cyanosis Psych: Normal mood and affect, stable judgement and insight     The results of significant diagnostics from this hospitalization (including imaging, microbiology, ancillary and laboratory) are listed below for reference.     Microbiology: No results found for this or any previous visit (from the past 240 hour(s)).   Labs: BNP (last 3 results) No results for input(s): "BNP" in the last 8760 hours. Basic Metabolic Panel: Recent Labs  Lab 06/15/22 0838 06/17/22 1351 06/18/22 0550  NA 138 138 133*  K 3.2* 2.7* 3.0*  CL 101 101 100  CO2 23 23 21*  GLUCOSE 226* 70 141*  BUN '9 7 8  '$ CREATININE 0.77 0.85 0.69  CALCIUM 10.1 9.0 8.4*  MG  --   --  2.7*   Liver Function Tests: Recent Labs  Lab 06/15/22 0838 06/17/22 1351  AST 27 37  ALT 32 37  ALKPHOS 74 65  BILITOT 0.4 0.5  PROT 8.1 7.5  ALBUMIN 4.7 4.1   Recent Labs  Lab 06/15/22 0838 06/17/22 1351  LIPASE <10* 29   No results for input(s): "AMMONIA" in the last 168 hours. CBC: Recent Labs  Lab 06/15/22 0838 06/17/22 1351 06/18/22 0550  WBC 9.3 7.0 10.2  NEUTROABS  --  3.9  --   HGB 13.9 14.2 12.6  HCT 39.9 41.7 37.0  MCV 91.5 92.7 93.0  PLT 360 461* 367   Cardiac Enzymes: No results for input(s): "CKTOTAL", "CKMB", "CKMBINDEX", "TROPONINI" in the last 168 hours. BNP: Invalid  input(s): "POCBNP" CBG: Recent Labs  Lab 06/15/22 0915 06/17/22 1445 06/17/22 2342 06/18/22 0846  GLUCAP 222* 75 144* 102*   D-Dimer No results for input(s): "DDIMER" in the last 72 hours. Hgb A1c No results for input(s): "HGBA1C" in the last 72 hours. Lipid Profile No results for input(s): "CHOL", "HDL", "LDLCALC", "TRIG", "CHOLHDL", "LDLDIRECT" in the last 72 hours. Thyroid function studies No results for input(s): "TSH", "T4TOTAL", "T3FREE", "THYROIDAB" in the last 72 hours.  Invalid input(s): "FREET3" Anemia work up No results for input(s): "VITAMINB12", "FOLATE", "FERRITIN", "TIBC", "IRON", "RETICCTPCT" in the last 72 hours. Urinalysis    Component Value Date/Time   COLORURINE YELLOW 06/17/2022  [place] 06/17/2022 1600   LABSPEC 1.016 06/17/2022 1600   PHURINE 8.0 06/17/2022 1600   GLUCOSEU NEGATIVE 06/17/2022 1600   HGBUR NEGATIVE 06/17/2022 1600   BILIRUBINUR NEGATIVE 06/17/2022 1600   BILIRUBINUR Negative 04/12/2022 1110   KETONESUR 20 (A) 06/17/2022 1600   PROTEINUR >=300 (A) 06/17/2022 1600   UROBILINOGEN 0.2 04/12/2022 1110   UROBILINOGEN 1.0 02/01/2022 1020   NITRITE NEGATIVE 06/17/2022 1600   LEUKOCYTESUR NEGATIVE 06/17/2022 1600   Sepsis Labs Recent Labs  Lab 06/15/22 0838 06/17/22 1351 06/18/22 0550  WBC 9.3 7.0 10.2   Microbiology No results found for this or any previous visit (from the past 240 hour(s)).   Patient was seen and examined on the day of discharge and was found to be in stable condition. Time coordinating discharge: 35 minutes including assessment and coordination of care, as well as examination of the patient.   SIGNED:  Dessa Phi, DO [facility] 06/18/2022, 10:18 AM

## 2022-06-18 NOTE — Plan of Care (Signed)
Patient Judy Cook, VSS throughout shift.  Denied pain and SOB.  Pt c/o N/V and had one emesis episode.  PRN zofran given for relieved.  All meds given on time as ordered.  Diminished lungs, IS encouraged.  Pt ambulated to bathroom with steady gait.  POC maintained, will continue to monitor.  Problem: Education: Goal: Knowledge of General Education information will improve Description: Including pain rating scale, medication(s)/side effects and non-pharmacologic comfort measures Outcome: Progressing   Problem: Health Behavior/Discharge Planning: Goal: Ability to manage health-related needs will improve Outcome: Progressing   Problem: Clinical Measurements: Goal: Ability to maintain clinical measurements within normal limits will improve Outcome: Progressing Goal: Will remain free from infection Outcome: Progressing Goal: Diagnostic test results will improve Outcome: Progressing Goal: Respiratory complications will improve Outcome: Progressing Goal: Cardiovascular complication will be avoided Outcome: Progressing   Problem: Activity: Goal: Risk for activity intolerance will decrease Outcome: Progressing   Problem: Nutrition: Goal: Adequate nutrition will be maintained Outcome: Progressing   Problem: Coping: Goal: Level of anxiety will decrease Outcome: Progressing   Problem: Elimination: Goal: Will not experience complications related to bowel motility Outcome: Progressing Goal: Will not experience complications related to urinary retention Outcome: Progressing   Problem: Pain Managment: Goal: General experience of comfort will improve Outcome: Progressing   Problem: Safety: Goal: Ability to remain free from injury will improve Outcome: Progressing   Problem: Skin Integrity: Goal: Risk for impaired skin integrity will decrease Outcome: Progressing

## 2022-06-19 ENCOUNTER — Telehealth: Payer: Self-pay

## 2022-06-19 NOTE — Transitions of Care (Post Inpatient/ED Visit) (Signed)
   06/19/2022  Name: Judy Cook MRN: [id number] DOB: 2001-01-26  Today's TOC FU Call Status: Today's TOC FU Call Status:: Unsuccessul Call (1st Attempt) Unsuccessful Call (1st Attempt) Date: 06/19/22  Attempted to reach the patient regarding the most recent Inpatient/ED visit.  Follow Up Plan: Additional outreach attempts will be made to reach the patient to complete the Transitions of Care (Post Inpatient/ED visit) call.   [place] LPN [place] Advisor Direct Dial [phone]

## 2022-06-20 NOTE — Transitions of Care (Post Inpatient/ED Visit) (Signed)
   06/20/2022  Name: Judy Cook MRN: [id number] DOB: 2000/04/28  Today's TOC FU Call Status: Today's TOC FU Call Status:: Unsuccessful Call (2nd Attempt) Unsuccessful Call (1st Attempt) Date: 06/19/22 Unsuccessful Call (2nd Attempt) Date: 06/20/22  Attempted to reach the patient regarding the most recent Inpatient/ED visit.  Follow Up Plan: No further outreach attempts will be made at this time. We have been unable to contact the patient.  [place] LPN [place] Advisor Direct Dial [phone]

## 2022-07-04 ENCOUNTER — Encounter (HOSPITAL_COMMUNITY): Payer: Self-pay | Admitting: Obstetrics and Gynecology

## 2022-07-04 ENCOUNTER — Other Ambulatory Visit (HOSPITAL_COMMUNITY): Payer: Self-pay | Admitting: Obstetrics and Gynecology

## 2022-07-04 ENCOUNTER — Telehealth: Payer: Self-pay

## 2022-07-04 ENCOUNTER — Other Ambulatory Visit: Payer: Self-pay | Admitting: Obstetrics and Gynecology

## 2022-07-04 ENCOUNTER — Other Ambulatory Visit: Payer: Self-pay

## 2022-07-04 ENCOUNTER — Encounter (HOSPITAL_BASED_OUTPATIENT_CLINIC_OR_DEPARTMENT_OTHER): Payer: Self-pay | Admitting: Obstetrics & Gynecology

## 2022-07-04 ENCOUNTER — Ambulatory Visit (INDEPENDENT_AMBULATORY_CARE_PROVIDER_SITE_OTHER): Payer: Federal, State, Local not specified - PPO | Admitting: Obstetrics & Gynecology

## 2022-07-04 VITALS — BP 147/88 | HR 114 | Ht 60.0 in | Wt 143.6 lb

## 2022-07-04 DIAGNOSIS — O034 Incomplete spontaneous abortion without complication: Secondary | ICD-10-CM

## 2022-07-04 DIAGNOSIS — E1065 Type 1 diabetes mellitus with hyperglycemia: Secondary | ICD-10-CM | POA: Diagnosis not present

## 2022-07-04 DIAGNOSIS — E109 Type 1 diabetes mellitus without complications: Secondary | ICD-10-CM

## 2022-07-04 DIAGNOSIS — Z6791 Unspecified blood type, Rh negative: Secondary | ICD-10-CM

## 2022-07-04 DIAGNOSIS — Z3A00 Weeks of gestation of pregnancy not specified: Secondary | ICD-10-CM

## 2022-07-04 DIAGNOSIS — N719 Inflammatory disease of uterus, unspecified: Secondary | ICD-10-CM | POA: Diagnosis not present

## 2022-07-04 MED ORDER — CEFTRIAXONE SODIUM 500 MG IJ SOLR
250.0000 mg | Freq: Once | INTRAMUSCULAR | Status: AC
  Administered 2022-07-04: 250 mg via INTRAMUSCULAR

## 2022-07-04 MED ORDER — METRONIDAZOLE 500 MG PO TABS: 500.0000 mg | ORAL_TABLET | Freq: Two times a day (BID) | ORAL | 0 refills | Status: DC

## 2022-07-04 MED ORDER — HYDROCODONE-ACETAMINOPHEN 5-325 MG PO TABS: 1.0000 | ORAL_TABLET | Freq: Four times a day (QID) | ORAL | 0 refills | Status: DC | PRN

## 2022-07-04 MED ORDER — DOXYCYCLINE HYCLATE 100 MG PO CAPS: 100.0000 mg | ORAL_CAPSULE | Freq: Two times a day (BID) | ORAL | 0 refills | Status: DC

## 2022-07-04 NOTE — Progress Notes (Signed)
Spoke with pt for pre-op call. Pt denies cardiac history or HTN. Pt is a type 1 diabetic. Last A1C was 7.3 on 06/08/22. Pt has a Dexcom continous blood sugar meter. She also is on an Insulin pump. I have asked her to call Dr. Quin Hoop office to notify her that she is having surgery tomorrow and to ask if she needs to reduce the basal rate. She states she will cal and check with her. I instructed pt to check her blood sugar when she wakes up in the morning and every 2 hours until she leaves for the hospital. If blood sugar is 70 or below, treat with 1/2 cup of clear juice (apple or cranberry) and recheck blood sugar 15 minutes after drinking juice. If blood sugar continues to be 70 or below, call the Short Stay department and ask to speak to a nurse. Pt voiced understanding.   Instructed pt that she would need to have a responsible adult to drive her home and be with her for the 24 hours after the surgery. She states she has no one that can do that. I called Dr. Ilda Basset' office and spoke with the scheduler and she has changed pt to a OP in bed status. I have notified the patient that she would be staying overnight. She was appreciative of this.   Shower instructions given to pt.

## 2022-07-04 NOTE — Telephone Encounter (Signed)
Called patient, no answer, left voicemail with surgery date, time, location, and preop instructions.

## 2022-07-04 NOTE — Progress Notes (Addendum)
This is from Dr. Ilda Basset from a secure chat that he sent me today. He spoke with her endocrinologist.   "No change to the regimen, as long as she is fasting , the pump will continue to give her the basal rate and the pump will correct for hyperglycemia due to the IQ technology.   If she eats, she will enter 4 grams to her pump each time she eats .   She just needs to make sure she doesn't run out of the insulin halfway through the procedure, that's something she can manage, by changing the pump at a time where she grantees this does not happen"   I notified the patient about making sure she has enough insulin for the pump tomorrow. She voiced understanding.

## 2022-07-04 NOTE — Progress Notes (Signed)
GYNECOLOGY  VISIT  CC:   follow up after termination  HPI: 22 y.o. [place] female here for follow up after undergoing an elective Ab on 06/10/2022.  Reports intermittent spotting and at times passage of tissue since the procedure.   She was seen in the ER on 2/22 after the procedure for nausea and vomiting.  She wasn't sure of the reason for this but did have excessive alcohol the night before.  Having this procedure has been hard.  Pt was seen back in the ER on 2/24 with nausea and vomiting and increased cramping and pain.  Was having bleeding then.  Ultrasound done and report states gestational sac.  I have reviewed these images personally and there appears to be echogenic material, thickened endometrium.  What was measured as a gestational sac does not appear normal either.  Recommended 14 day follow up on report.  Ob on call was notified.  She was given misoprotol.  Pt reports taking it and having some increased bleeding but no significant passage of tissue.    She has continued to have some intermittent bleeding and discharge.  She has had intercourse three times since the procedure but she reports they used condoms each time.  She is having increased cramping the last few days and having more generalized pelvic pain.  Denies fever.  Possibly some vaginal odor.    Pt has pump and hba1c 2/15 was 7.3.  Reports blood sugars have been more elevated this past few days.  Blood sugar was 180 in office today.  MBT O neg.  Pt doesn't remember receiving rhogam.    UPT weakly positive today.   Past Medical History:  Diagnosis Date   Diabetes mellitus ([place])     MEDS:   Current Outpatient Medications on File Prior to Visit  Medication Sig Dispense Refill   ACCU-CHEK GUIDE test strip Use to check your blood sugar 4 times a day (before meals and at bedtime) 100 each 11   Accu-Chek Softclix Lancets lancets SMARTSIG:Topical 1-4 Times Daily     blood glucose meter kit and  supplies Dispense based on patient and insurance preference. Use up to four times daily as directed. (FOR ICD-10 E10.9, E11.9). 1 each 0   capsicum (ZOSTRIX) 0.075 % topical cream Apply 1 application. topically 2 (two) times daily. 120 g 1   Continuous Blood Gluc Sensor (DEXCOM G6 SENSOR) MISC USE AS DIRECTED. 9 each 3   Continuous Blood Gluc Transmit (DEXCOM G6 TRANSMITTER) MISC 1 Device by Does not apply route as directed. 1 each 3   insulin aspart (NOVOLOG) 100 UNIT/ML injection Max Daily 60 units per pump 60 mL 11   norgestimate-ethinyl estradiol (ORTHO-CYCLEN) 0.25-35 MG-MCG tablet Take 1 tablet by mouth daily. 28 tablet 11   ondansetron (ZOFRAN-ODT) 8 MG disintegrating tablet Take 1 tablet (8 mg total) by mouth every 8 (eight) hours as needed for nausea or vomiting. 20 tablet 0   dicyclomine (BENTYL) 20 MG tablet Take 1 tablet (20 mg total) by mouth 2 (two) times daily. (Patient not taking: Reported on 07/04/2022) 20 tablet 0   fluticasone (FLONASE) 50 MCG/ACT nasal spray Place 2 sprays into both nostrils daily. (Patient not taking: Reported on 07/04/2022) 16 g 6   ondansetron (ZOFRAN) 4 MG tablet Take 1 tablet (4 mg total) by mouth every 6 (six) hours. (Patient not taking: Reported on 07/04/2022) 20 tablet 0   pantoprazole (PROTONIX) 40 MG tablet Take 1 tablet (40 mg total) by  mouth daily. 30 tablet 0   No current facility-administered medications on file prior to visit.    ALLERGIES: Patient has no known allergies.  SH:  single, non smoker  Review of Systems  Constitutional: Negative.   Genitourinary:        Bleeding and discharge    PHYSICAL EXAMINATION:    BP (!) 147/88   Pulse (!) 114   Ht 5' (1.524 m)   Wt 143 lb 9.6 oz (65.1 kg)   LMP 07/02/2022 (Approximate)   BMI 28.04 kg/m     General appearance: alert, cooperative and appears stated age CV:  tachycardic initially but pulse slowed to <100, no murmurs Lungs:  clear to auscultation, no wheezes, rales or rhonchi,  symmetric air entry Abdomen: soft, non-tender; bowel sounds normal; no masses,  no organomegaly Lymph:  no inguinal LAD noted  Pelvic: External genitalia:  no lesions              Urethra:  normal appearing urethra with no masses, tenderness or lesions              Bartholins and Skenes: normal                 Vagina: normal appearing vagina with normal color, creamy appearing vaginal discharge with some odor, blood present as well              Cervix: no lesions              Bimanual Exam:  Uterus:   about 8 weeks size, full, mild tenderness to palpation              Adnexa: no mass, fullness, tenderness  Bedside ultrasound performed:  Endometrium echogenic with measurement up to 2.7cm at one location.  No clear gestational sac.  Areas that appear to be blood present, ovaries normal, no free fluid.  Mildly tender with exam.  Images will be scanned into EPIC.  Chaperone, Ezekiel Ina, RN, was present for exam.  Assessment/Plan: 1. Retained products of conception following abortion - as pt has failed cytotec, feel D&E needed.  Procedure discussed including risks, benefits.  Message communicated to surgery scheduling after discussing with Dr. Kennon Rounds, second attending today to ensure this could be done on non-urgent basis - CBC with Differential/Platelet - Beta hCG quant (ref lab) - HYDROcodone-acetaminophen (NORCO/VICODIN) 5-325 MG tablet; Take 1-2 tablets by mouth every 6 (six) hours as needed for moderate pain.  Dispense: 12 tablet; Refill: 0  2. Endometritis - doxycycline (VIBRAMYCIN) 100 MG capsule; Take 1 capsule (100 mg total) by mouth 2 (two) times daily. Take with food as can cause GI distress.  Dispense: 14 capsule; Refill: 0 - metroNIDAZOLE (FLAGYL) 500 MG tablet; Take 1 tablet (500 mg total) by mouth 2 (two) times daily.  Dispense: 14 tablet; Refill: 0 - cefTRIAXone (ROCEPHIN) injection 250 mg  3. Type 1 diabetes mellitus without complication (HCC) - sees Dr. Kelton Pillar.  Has a  pump for insulin control.  4. Blood type, Rh negative

## 2022-07-05 ENCOUNTER — Encounter (HOSPITAL_COMMUNITY): Payer: Self-pay | Admitting: Obstetrics and Gynecology

## 2022-07-05 ENCOUNTER — Encounter (HOSPITAL_COMMUNITY)
Admission: RE | Disposition: A | Discharge status: Home / Self Care | Payer: Self-pay | Source: Ambulatory Visit | Attending: Obstetrics and Gynecology

## 2022-07-05 ENCOUNTER — Ambulatory Visit (HOSPITAL_COMMUNITY): Payer: Federal, State, Local not specified - PPO

## 2022-07-05 ENCOUNTER — Other Ambulatory Visit: Payer: Self-pay

## 2022-07-05 ENCOUNTER — Ambulatory Visit (HOSPITAL_COMMUNITY): Payer: Federal, State, Local not specified - PPO | Admitting: Anesthesiology

## 2022-07-05 ENCOUNTER — Encounter (HOSPITAL_COMMUNITY): Payer: Self-pay

## 2022-07-05 ENCOUNTER — Ambulatory Visit (HOSPITAL_COMMUNITY)
Admission: RE | Admit: 2022-07-05 | Discharge: 2022-07-05 | Disposition: A | Discharge status: Home / Self Care | Payer: Federal, State, Local not specified - PPO | Source: Ambulatory Visit | Attending: Obstetrics and Gynecology | Admitting: Obstetrics and Gynecology

## 2022-07-05 DIAGNOSIS — Z0389 Encounter for observation for other suspected diseases and conditions ruled out: Secondary | ICD-10-CM | POA: Diagnosis not present

## 2022-07-05 DIAGNOSIS — Z3A00 Weeks of gestation of pregnancy not specified: Secondary | ICD-10-CM | POA: Diagnosis not present

## 2022-07-05 DIAGNOSIS — O034 Incomplete spontaneous abortion without complication: Secondary | ICD-10-CM | POA: Diagnosis not present

## 2022-07-05 DIAGNOSIS — O24019 Pre-existing diabetes mellitus, type 1, in pregnancy, unspecified trimester: Secondary | ICD-10-CM | POA: Insufficient documentation

## 2022-07-05 DIAGNOSIS — K219 Gastro-esophageal reflux disease without esophagitis: Secondary | ICD-10-CM | POA: Diagnosis not present

## 2022-07-05 DIAGNOSIS — Z833 Family history of diabetes mellitus: Secondary | ICD-10-CM | POA: Insufficient documentation

## 2022-07-05 DIAGNOSIS — E109 Type 1 diabetes mellitus without complications: Secondary | ICD-10-CM | POA: Diagnosis not present

## 2022-07-05 DIAGNOSIS — O021 Missed abortion: Secondary | ICD-10-CM | POA: Diagnosis not present

## 2022-07-05 DIAGNOSIS — Z6791 Unspecified blood type, Rh negative: Secondary | ICD-10-CM

## 2022-07-05 DIAGNOSIS — Z9889 Other specified postprocedural states: Secondary | ICD-10-CM

## 2022-07-05 DIAGNOSIS — Z794 Long term (current) use of insulin: Secondary | ICD-10-CM | POA: Diagnosis not present

## 2022-07-05 DIAGNOSIS — Z9641 Presence of insulin pump (external) (internal): Secondary | ICD-10-CM | POA: Diagnosis not present

## 2022-07-05 HISTORY — DX: Vomiting, unspecified: R11.10

## 2022-07-05 HISTORY — DX: Gastro-esophageal reflux disease without esophagitis: K21.9

## 2022-07-05 HISTORY — DX: Headache, unspecified: R51.9

## 2022-07-05 HISTORY — PX: DILATION AND EVACUATION: SHX1459

## 2022-07-05 HISTORY — PX: OPERATIVE ULTRASOUND: SHX5996

## 2022-07-05 LAB — CBC WITH DIFFERENTIAL/PLATELET
Basophils Absolute: 0 10*3/uL (ref 0.0–0.2)
Basos: 0 %
EOS (ABSOLUTE): 0.1 10*3/uL (ref 0.0–0.4)
Eos: 1 %
Hematocrit: 33.8 % — ABNORMAL LOW (ref 34.0–46.6)
Hemoglobin: 11.4 g/dL (ref 11.1–15.9)
Immature Grans (Abs): 0 10*3/uL (ref 0.0–0.1)
Immature Granulocytes: 0 %
Lymphocytes Absolute: 2.4 10*3/uL (ref 0.7–3.1)
Lymphs: 17 %
MCH: 31.4 pg (ref 26.6–33.0)
MCHC: 33.7 g/dL (ref 31.5–35.7)
MCV: 93 fL (ref 79–97)
Monocytes Absolute: 0.7 10*3/uL (ref 0.1–0.9)
Monocytes: 5 %
Neutrophils Absolute: 10.9 10*3/uL — ABNORMAL HIGH (ref 1.4–7.0)
Neutrophils: 77 %
Platelets: 496 10*3/uL — ABNORMAL HIGH (ref 150–450)
RBC: 3.63 x10E6/uL — ABNORMAL LOW (ref 3.77–5.28)
RDW: 13.2 % (ref 11.7–15.4)
WBC: 14.3 10*3/uL — ABNORMAL HIGH (ref 3.4–10.8)

## 2022-07-05 LAB — TYPE AND SCREEN
ABO/RH(D): O NEG
Antibody Screen: NEGATIVE

## 2022-07-05 LAB — BETA HCG QUANT (REF LAB): hCG Quant: 84 m[IU]/mL

## 2022-07-05 LAB — GLUCOSE, CAPILLARY
Glucose-Capillary: 129 mg/dL — ABNORMAL HIGH (ref 70–99)
Glucose-Capillary: 114 mg/dL — ABNORMAL HIGH (ref 70–99)

## 2022-07-05 SURGERY — DILATION AND EVACUATION, UTERUS
Anesthesia: General | Site: Uterus

## 2022-07-05 MED ORDER — ONDANSETRON HCL 4 MG/2ML IJ SOLN
INTRAMUSCULAR | Status: DC | PRN
  Administered 2022-07-05: 4 mg via INTRAVENOUS

## 2022-07-05 MED ORDER — ROCURONIUM BROMIDE 10 MG/ML (PF) SYRINGE
PREFILLED_SYRINGE | INTRAVENOUS | Status: AC
  Filled 2022-07-05: qty 10

## 2022-07-05 MED ORDER — LIDOCAINE 2% (20 MG/ML) 5 ML SYRINGE
INTRAMUSCULAR | Status: DC | PRN
  Administered 2022-07-05: 60 mg via INTRAVENOUS

## 2022-07-05 MED ORDER — DEXAMETHASONE SODIUM PHOSPHATE 10 MG/ML IJ SOLN
INTRAMUSCULAR | Status: DC | PRN
  Administered 2022-07-05: 4 mg via INTRAVENOUS

## 2022-07-05 MED ORDER — 0.9 % SODIUM CHLORIDE (POUR BTL) OPTIME
TOPICAL | Status: DC | PRN
  Administered 2022-07-05: 1000 mL

## 2022-07-05 MED ORDER — FENTANYL CITRATE (PF) 100 MCG/2ML IJ SOLN: 25.0000 ug | INTRAMUSCULAR | Status: DC | PRN

## 2022-07-05 MED ORDER — ORAL CARE MOUTH RINSE: 15.0000 mL | Freq: Once | OROMUCOSAL | Status: DC

## 2022-07-05 MED ORDER — DEXAMETHASONE SODIUM PHOSPHATE 10 MG/ML IJ SOLN
INTRAMUSCULAR | Status: AC
  Filled 2022-07-05: qty 2

## 2022-07-05 MED ORDER — CHLORHEXIDINE GLUCONATE 0.12 % MT SOLN: 15.0000 mL | Freq: Once | OROMUCOSAL | Status: DC

## 2022-07-05 MED ORDER — KETOROLAC TROMETHAMINE 30 MG/ML IJ SOLN
INTRAMUSCULAR | Status: AC
  Filled 2022-07-05: qty 1

## 2022-07-05 MED ORDER — IBUPROFEN 200 MG PO TABS: 600.0000 mg | ORAL_TABLET | Freq: Four times a day (QID) | ORAL | 0 refills | Status: DC | PRN

## 2022-07-05 MED ORDER — RHO D IMMUNE GLOBULIN 1500 UNIT/2ML IJ SOSY
300.0000 ug | PREFILLED_SYRINGE | Freq: Once | INTRAMUSCULAR | Status: AC
  Administered 2022-07-05: 300 ug via INTRAVENOUS
  Filled 2022-07-05: qty 2

## 2022-07-05 MED ORDER — INSULIN ASPART 100 UNIT/ML IJ SOLN: 0.0000 [IU] | INTRAMUSCULAR | Status: DC | PRN

## 2022-07-05 MED ORDER — MIDAZOLAM HCL 2 MG/2ML IJ SOLN
INTRAMUSCULAR | Status: AC
  Filled 2022-07-05: qty 2

## 2022-07-05 MED ORDER — ACETAMINOPHEN 500 MG PO TABS: 1000.0000 mg | ORAL_TABLET | Freq: Once | ORAL | Status: DC

## 2022-07-05 MED ORDER — MIDAZOLAM HCL 2 MG/2ML IJ SOLN
INTRAMUSCULAR | Status: DC | PRN
  Administered 2022-07-05: 2 mg via INTRAVENOUS

## 2022-07-05 MED ORDER — LIDOCAINE 2% (20 MG/ML) 5 ML SYRINGE
INTRAMUSCULAR | Status: AC
  Filled 2022-07-05: qty 5

## 2022-07-05 MED ORDER — KETOROLAC TROMETHAMINE 30 MG/ML IJ SOLN
INTRAMUSCULAR | Status: DC | PRN
  Administered 2022-07-05: 30 mg via INTRAVENOUS

## 2022-07-05 MED ORDER — LIDOCAINE HCL (PF) 1 % IJ SOLN
INTRAMUSCULAR | Status: AC
  Filled 2022-07-05: qty 30

## 2022-07-05 MED ORDER — SODIUM CHLORIDE 0.9 % IV SOLN: INTRAVENOUS | Status: DC

## 2022-07-05 MED ORDER — DOXYCYCLINE HYCLATE 100 MG IV SOLR
200.0000 mg | Freq: Once | INTRAVENOUS | Status: AC
  Administered 2022-07-05: 200 mg via INTRAVENOUS
  Filled 2022-07-05: qty 200

## 2022-07-05 MED ORDER — ONDANSETRON HCL 4 MG/2ML IJ SOLN
INTRAMUSCULAR | Status: AC
  Filled 2022-07-05: qty 4

## 2022-07-05 MED ORDER — SUCCINYLCHOLINE CHLORIDE 200 MG/10ML IV SOSY
PREFILLED_SYRINGE | INTRAVENOUS | Status: AC
  Filled 2022-07-05: qty 10

## 2022-07-05 MED ORDER — LIDOCAINE HCL 1 % IJ SOLN
INTRAMUSCULAR | Status: DC | PRN
  Administered 2022-07-05: 20 mL

## 2022-07-05 MED ORDER — PROPOFOL 10 MG/ML IV BOLUS
INTRAVENOUS | Status: DC | PRN
  Administered 2022-07-05: 150 mg via INTRAVENOUS
  Administered 2022-07-05: 50 mg via INTRAVENOUS

## 2022-07-05 SURGICAL SUPPLY — 18 items
FILTER UTR ASPR ASSEMBLY (MISCELLANEOUS) ×1 IMPLANT
GLOVE BIOGEL PI IND STRL 7.0 (GLOVE) ×1 IMPLANT
GLOVE BIOGEL PI IND STRL 7.5 (GLOVE) ×1 IMPLANT
GLOVE NEODERM STER SZ 7 (GLOVE) ×1 IMPLANT
GOWN STRL REUS W/ TWL LRG LVL3 (GOWN DISPOSABLE) ×1 IMPLANT
GOWN STRL REUS W/ TWL XL LVL3 (GOWN DISPOSABLE) ×1 IMPLANT
GOWN STRL REUS W/TWL LRG LVL3 (GOWN DISPOSABLE) ×1
GOWN STRL REUS W/TWL XL LVL3 (GOWN DISPOSABLE) ×1
HOSE CONNECTING 18IN BERKELEY (TUBING) ×1 IMPLANT
KIT BERKELEY 1ST TRI 3/8 NO TR (MISCELLANEOUS) ×1 IMPLANT
KIT BERKELEY 1ST TRIMESTER 3/8 (MISCELLANEOUS) ×1 IMPLANT
NS IRRIG 1000ML POUR BTL (IV SOLUTION) ×1 IMPLANT
PACK VAGINAL MINOR WOMEN LF (CUSTOM PROCEDURE TRAY) ×1 IMPLANT
PAD OB MATERNITY 4.3X12.25 (PERSONAL CARE ITEMS) ×1 IMPLANT
SET BERKELEY SUCTION TUBING (SUCTIONS) ×1 IMPLANT
TOWEL GREEN STERILE FF (TOWEL DISPOSABLE) ×2 IMPLANT
VACURETTE 10 RIGID CVD (CANNULA) IMPLANT
VACURETTE 9 RIGID CVD (CANNULA) IMPLANT

## 2022-07-05 NOTE — Anesthesia Preprocedure Evaluation (Addendum)
Anesthesia Evaluation  Patient identified by MRN, date of birth, ID band Patient awake    Reviewed: Allergy & Precautions, H&P , NPO status , Patient's Chart, lab work & pertinent test results  Airway Mallampati: II  TM Distance: >3 FB Neck ROM: Full    Dental no notable dental hx. (+) Teeth Intact, Dental Advisory Given   Pulmonary Current Smoker and Patient abstained from smoking.   Pulmonary exam normal breath sounds clear to auscultation       Cardiovascular negative cardio ROS  Rhythm:Regular Rate:Normal     Neuro/Psych  Headaches  negative psych ROS   GI/Hepatic Neg liver ROS,GERD  ,,  Endo/Other  diabetes, Type 1, Insulin Dependent    Renal/GU negative Renal ROS  negative genitourinary   Musculoskeletal   Abdominal   Peds  Hematology negative hematology ROS (+)   Anesthesia Other Findings   Reproductive/Obstetrics negative OB ROS                             Anesthesia Physical Anesthesia Plan  ASA: 3  Anesthesia Plan: General   Post-op Pain Management: Tylenol PO (pre-op)* and Toradol IV (intra-op)*   Induction: Intravenous  PONV Risk Score and Plan: 3 and Ondansetron, Dexamethasone and Midazolam  Airway Management Planned: LMA  Additional Equipment:   Intra-op Plan:   Post-operative Plan: Extubation in OR  Informed Consent: I have reviewed the patients History and Physical, chart, labs and discussed the procedure including the risks, benefits and alternatives for the proposed anesthesia with the patient or authorized representative who has indicated his/her understanding and acceptance.     Dental advisory given  Plan Discussed with: CRNA  Anesthesia Plan Comments:        Anesthesia Quick Evaluation

## 2022-07-05 NOTE — Transfer of Care (Signed)
Immediate Anesthesia Transfer of Care Note  Patient: Judy Cook  Procedure(s) Performed: DILATATION AND EVACUATION (Uterus) OPERATIVE ULTRASOUND (Uterus)  Patient Location: PACU  Anesthesia Type:General  Level of Consciousness: drowsy  Airway & Oxygen Therapy: Patient Spontanous Breathing and Patient connected to face mask oxygen  Post-op Assessment: Report given to RN and Post -op Vital signs reviewed and stable  Post vital signs: Reviewed and stable  Last Vitals:  Vitals Value Taken Time  BP 109/73 07/05/22 1445  Temp    Pulse 87 07/05/22 1445  Resp 15 07/05/22 1445  SpO2 100 % 07/05/22 1445  Vitals shown include unvalidated device data.  Last Pain:  Vitals:   07/05/22 1215  TempSrc: Oral  PainSc: 0-No pain         Complications: No notable events documented.

## 2022-07-05 NOTE — Anesthesia Postprocedure Evaluation (Signed)
Anesthesia Post Note  Patient: Designer, television/film set  Procedure(s) Performed: DILATATION AND EVACUATION (Uterus) OPERATIVE ULTRASOUND (Uterus)     Patient location during evaluation: PACU Anesthesia Type: General Level of consciousness: awake and alert Pain management: pain level controlled Vital Signs Assessment: post-procedure vital signs reviewed and stable Respiratory status: spontaneous breathing, nonlabored ventilation and respiratory function stable Cardiovascular status: blood pressure returned to baseline and stable Postop Assessment: no apparent nausea or vomiting Anesthetic complications: no  No notable events documented.  Last Vitals:  Vitals:   07/05/22 1500 07/05/22 1515  BP: 119/68 123/84  Pulse: 98 88  Resp: 20 19  Temp:  36.8 C  SpO2: 98% 100%    Last Pain:  Vitals:   07/05/22 1515  TempSrc:   PainSc: 3                  [name],W. EDMOND

## 2022-07-05 NOTE — Op Note (Signed)
Operative Note   07/05/2022  PRE-OP DIAGNOSIS: retained products of conception after a first trimester elective abortion   POST-OP DIAGNOSIS: Same  SURGEON: Surgeon(s) and Role:    Aletha Halim, MD - Primary  ASSISTANT: None  PROCEDURE:  Suction dilation and curettage via ultrasound guidance  ANESTHESIA: General and paracervical block  ESTIMATED BLOOD LOSS: 67m  DRAINS: none  TOTAL IV FLUIDS: per anesthesia note  SPECIMENS: products of conception to pathology  VTE PROPHYLAXIS: SCDs to the bilateral lower extremities  ANTIBIOTICS: Doxycycline '200mg'$  IV x 1 pre op  COMPLICATIONS: none  DISPOSITION: PACU - hemodynamically stable.  CONDITION: stable  BLOOD TYPE: O NEG. Rhogam given:{YES/NO/NOT APPLICABLE:20182}***  FINDINGS: Exam under anesthesia revealed 8-10 week sized uterus with no masses and bilateral adnexa without masses or fullness, normal EGBUS, vaginal vault and cervix. Moderate amount of necrotic appearing products of conception were seen, with gritty texture in all four quadrants. Moderate amount of material in endometrial cavity with ?blood flow with thin endometrial stripe (avascular) seen at the end of the procedure.  PROCEDURE IN DETAIL:  After informed consent was obtained, the patient was taken to the operating room where anesthesia was obtained without difficulty. The patient was positioned in the dorsal lithotomy position in [place] The patient was examined under anesthesia, with the above noted findings.  The bi-valved speculum was placed inside the patient's vagina, and the the anterior lip of the cervix was seen and grasped with the tenaculum.  A paracervical block was achieved with 218mof 1% lidocaine and then the cervix easily passed a #10 cannula. The suction was then calibrated to 6024m and connected to the number 10 cannula, which was then introduced with the above noted findings. A gentle curettage was done at the end and yield no products  of conception.   The suction was then done one more time to remove any remaining curettage material.   Excellent hemostasis was noted, and all instruments were removed, with excellent hemostasis noted throughout.  She was then taken out of dorsal lithotomy. The patient tolerated the procedure well.  Sponge, lap and instrument counts were correct x2.  The patient was taken to recovery room in excellent condition.  ChaDurene Romans Attending [facility] [employer]

## 2022-07-05 NOTE — Anesthesia Procedure Notes (Signed)
Procedure Name: LMA Insertion Date/Time: 07/05/2022 2:19 PM  Performed by: Darletta Moll, CRNAPre-anesthesia Checklist: Patient identified, Emergency Drugs available, Suction available and Patient being monitored Patient Re-evaluated:Patient Re-evaluated prior to induction Oxygen Delivery Method: Circle system utilized Preoxygenation: Pre-oxygenation with 100% oxygen Induction Type: IV induction Ventilation: Mask ventilation without difficulty LMA: LMA inserted LMA Size: 4.0 Tube type: Oral Number of attempts: 1 Placement Confirmation: positive ETCO2, breath sounds checked- equal and bilateral and CO2 detector Tube secured with: Tape Dental Injury: Teeth and Oropharynx as per pre-operative assessment

## 2022-07-05 NOTE — H&P (Signed)
Obstetrics & Gynecology Surgical H&P   Date of Surgery: 07/05/2022    Primary OBGYN: [facility] for [facility]  Reason for Admission: scheduled d&c for retained products of conception  History of Present Illness: Judy Cook is a 22 y.o. G1P0010 (Patient's last menstrual period was 07/02/2022 (approximate).), with the above CC. PMHx is significant for DM1, Rh neg  Patient had elective abortion in February. She states it was an in office procedure and not medications. She is not aware if she received rhogam. Pt admitted for n/v later in the month and diagnosed with retained products of conception. OBGYN recommended giving the patient cytotec and having her follow up in the office. She did yesterday and she still had POCs. She was given a dose of rocephin and recommended she start PO doxy and flagyl and set up for surgery; she states she did start the oral abx in the interim  The patient states she still has light bleeding but nothing significant. Negative constitutional s/s.   ROS: A 12-point review of systems was performed and negative, except as stated in the above HPI.  OBGYN History: As per HPI. OB History  Gravida Para Term Preterm AB Living  1 0 0 0 1 0  SAB IAB Ectopic Multiple Live Births  0 1 0 0 0    # Outcome Date GA Lbr Len/2nd Weight Sex Delivery Anes PTL Lv  1 IAB  [redacted]w[redacted]d           Past Medical History: Past Medical History:  Diagnosis Date   COVID 2020   mild   Diabetes mellitus ([place]    Type 1   GERD (gastroesophageal reflux disease)    Headache     Past Surgical History: History reviewed. No pertinent surgical history.  Family History:  Family History  Problem Relation Age of Onset   Diabetes Paternal Grandfather    Heart attack Paternal Grandfather    Diabetes Paternal Grandmother    Cancer Maternal Grandmother        lung   Alcohol abuse Maternal Grandfather    Alcohol abuse Father    Drug abuse Father    High Cholesterol  Father    Alcohol abuse Mother    Depression Mother    Drug abuse Mother     Social History:  Social History   Socioeconomic History   Marital status: Single    Spouse name: Not on file   Number of children: Not on file   Years of education: Not on file   Highest education level: Not on file  Occupational History   Not on file  Tobacco Use   Smoking status: Some Days    Types: Cigarettes   Smokeless tobacco: Never  Vaping Use   Vaping Use: Former   Substances: Nicotine, THC  Substance and Sexual Activity   Alcohol use: Yes    Comment: socially   Drug use: Yes    Types: Marijuana   Sexual activity: Yes    Partners: Male    Birth control/protection: Pill  Other Topics Concern   Not on file  Social History Narrative   Not on file   Social Determinants of Health   Financial Resource Strain: Not on file  Food Insecurity: Unknown (06/18/2022)   Hunger Vital Sign    Worried About Running Out of Food in the Last Year: Never true    Ran Out of Food in the Last Year: Not on file  Transportation Needs: No Transportation Needs (06/18/2022)  PRAPARE - Hydrologist (Medical): No    Lack of Transportation (Non-Medical): No  Physical Activity: Not on file  Stress: Not on file  Social Connections: Not on file  Intimate Partner Violence: Not At Risk (06/18/2022)   Humiliation, Afraid, Rape, and Kick questionnaire    Fear of Current or Ex-Partner: No    Emotionally Abused: No    Physically Abused: No    Sexually Abused: No    Allergy: No Known Allergies  Current Outpatient Medications: Medications Prior to Admission  Medication Sig Dispense Refill Last Dose   HYDROcodone-acetaminophen (NORCO/VICODIN) 5-325 MG tablet Take 1-2 tablets by mouth every 6 (six) hours as needed for moderate pain. 12 tablet 0 07/05/2022   norgestimate-ethinyl estradiol (ORTHO-CYCLEN) 0.25-35 MG-MCG tablet Take 1 tablet by mouth daily. 28 tablet 11 07/05/2022   ACCU-CHEK  GUIDE test strip Use to check your blood sugar 4 times a day (before meals and at bedtime) 100 each 11    Accu-Chek Softclix Lancets lancets SMARTSIG:Topical 1-4 Times Daily      blood glucose meter kit and supplies Dispense based on patient and insurance preference. Use up to four times daily as directed. (FOR ICD-10 E10.9, E11.9). 1 each 0    capsicum (ZOSTRIX) 0.075 % topical cream Apply 1 application. topically 2 (two) times daily. 120 g 1    Continuous Blood Gluc Sensor (DEXCOM G6 SENSOR) MISC USE AS DIRECTED. 9 each 3    Continuous Blood Gluc Transmit (DEXCOM G6 TRANSMITTER) MISC 1 Device by Does not apply route as directed. 1 each 3    dicyclomine (BENTYL) 20 MG tablet Take 1 tablet (20 mg total) by mouth 2 (two) times daily. (Patient not taking: Reported on 07/04/2022) 20 tablet 0    doxycycline (VIBRAMYCIN) 100 MG capsule Take 1 capsule (100 mg total) by mouth 2 (two) times daily. Take with food as can cause GI distress. 14 capsule 0    fluticasone (FLONASE) 50 MCG/ACT nasal spray Place 2 sprays into both nostrils daily. (Patient not taking: Reported on 07/04/2022) 16 g 6    insulin aspart (NOVOLOG) 100 UNIT/ML injection Max Daily 60 units per pump 60 mL 11    metroNIDAZOLE (FLAGYL) 500 MG tablet Take 1 tablet (500 mg total) by mouth 2 (two) times daily. 14 tablet 0    ondansetron (ZOFRAN) 4 MG tablet Take 1 tablet (4 mg total) by mouth every 6 (six) hours. (Patient not taking: Reported on 07/04/2022) 20 tablet 0    ondansetron (ZOFRAN-ODT) 8 MG disintegrating tablet Take 1 tablet (8 mg total) by mouth every 8 (eight) hours as needed for nausea or vomiting. 20 tablet 0    pantoprazole (PROTONIX) 40 MG tablet Take 1 tablet (40 mg total) by mouth daily. 30 tablet 0      Hospital Medications: Current Facility-Administered Medications  Medication Dose Route Frequency Provider Last Rate Last Admin   0.9 %  sodium chloride infusion   Intravenous Continuous Roderic Palau, MD        acetaminophen (TYLENOL) tablet 1,000 mg  1,000 mg Oral Once Roderic Palau, MD       chlorhexidine (PERIDEX) 0.12 % solution 15 mL  15 mL Mouth/Throat Once Roderic Palau, MD       Or   Oral care mouth rinse  15 mL Mouth Rinse Once Roderic Palau, MD       doxycycline (VIBRAMYCIN) 200 mg in dextrose 5 % 250 mL IVPB  200 mg Intravenous Once Kyrel Leighton,  MD       insulin aspart (novoLOG) injection 0-14 Units  0-14 Units Subcutaneous Q2H PRN Roderic Palau, MD         Physical Exam:  Current Vital Signs 24h Vital Sign Ranges  T 98.7 F (37.1 C) Temp  Avg: 98.7 F (37.1 C)  Min: 98.7 F (37.1 C)  Max: 98.7 F (37.1 C)  BP (!) 147/89 BP  Min: 147/89  Max: 147/89  HR 95 Pulse  Avg: 95  Min: 95  Max: 95  RR 20 Resp  Avg: 20  Min: 20  Max: 20  SaO2 100 % Room Air SpO2  Avg: 100 %  Min: 100 %  Max: 100 %       24 Hour I/O Current Shift I/O  Time Ins Outs No intake/output data recorded. No intake/output data recorded.    Body mass index is 27.34 kg/m. From yesterday with Dr. Sabra Heck BP Marland Kitchen 147/88   Pulse (!) 114   Ht 5' (1.524 m)   Wt 143 lb 9.6 oz (65.1 kg)   LMP 07/02/2022 (Approximate)   BMI 28.04 kg/m     General appearance: alert, cooperative and appears stated age CV:  tachycardic initially but pulse slowed to <100, no murmurs Lungs:  clear to auscultation, no wheezes, rales or rhonchi, symmetric air entry Abdomen: soft, non-tender; bowel sounds normal; no masses,  no organomegaly Lymph:  no inguinal LAD noted   Pelvic: External genitalia:  no lesions              Urethra:  normal appearing urethra with no masses, tenderness or lesions              Bartholins and Skenes: normal                 Vagina: normal appearing vagina with normal color, creamy appearing vaginal discharge with some odor, blood present as well              Cervix: no lesions              Bimanual Exam:  Uterus:   about 8 weeks size, full, mild tenderness to palpation               Adnexa: no mass, fullness, tenderness   Laboratory: CBG 129  Recent Labs  Lab 07/04/22 1120  WBC 14.3*  HGB 11.4  HCT 33.8*  PLT 496*   HCG: 84  Recent Labs  Lab 07/05/22 1235  [place] PENDING    Imaging:  From yesterday Bedside ultrasound performed: Endometrium echogenic with measurement up to 2.7cm at one location. No clear gestational sac. Areas that appear to be blood present, ovaries normal, no free fluid. Mildly tender with exam. Images will be scanned into EPIC.   Narrative & Impression  CLINICAL DATA:  22 year old pregnant female with pelvic pain for 3 days. Beta HCG of 3,462   EXAM: OBSTETRIC <14 WK [place] AND TRANSVAGINAL OB US   TECHNIQUE: Both transabdominal and transvaginal ultrasound examinations were performed for complete evaluation of the gestation as well as the maternal uterus, adnexal regions, and pelvic cul-de-sac. Transvaginal technique was performed to assess early pregnancy.   COMPARISON:  None Available.   FINDINGS: Intrauterine gestational sac: 1.4 cm hypoechoic structure along the endometrium is indeterminate but could represent an early gestational sac.   Yolk sac:  Not Visualized.   Embryo:  Not Visualized.   Cardiac Activity: Not Visualized.   Maternal uterus/adnexae: The ovaries bilaterally are unremarkable.  No adnexal mass.   No free fluid.   IMPRESSION: 1. Equivocal early intrauterine gestational sac, but no yolk sac, fetal pole, or cardiac activity yet visualized. Recommend follow-up quantitative B-HCG levels and follow-up US in 14 days to confirm and assess viability. This recommendation follows SRU consensus guidelines: Diagnostic Criteria for Nonviable Pregnancy Early in the First Trimester. [place] Med [id number]. 2. Unremarkable ovaries.  No adnexal mass or free fluid.     Electronically Signed   By: Margarette Canada M.D.   On: 06/17/2022 16:53   Assessment: Judy Cook is a 22 y.o. G1P0010 (Patient's  last menstrual period was 07/02/2022 (approximate).) here for scheduled suction d&c for retained POCs; pt stable  Plan: Plan is for d&c with u/s guidance. I told her I recommend she continue the outpatient abx until completion. Patient thought she would stay overnight, but I told her that unless there is a complication that there is no indication to stay overnight. She states she has no family in the area and a friend just dropped her off. She has a roommate that can potentially stay with her overnight at home after surgery. Will follow up with patient after surgery to see if she has someone that can be with her overnight  Spoke to her endocrine yesterday and pt confirms message regarding her pump and settings.   Needs rhogam post procedure   Durene Romans MD Attending Center for [facility] [facility])

## 2022-07-05 NOTE — Discharge Instructions (Addendum)
   We will discuss your surgery once again in detail at your post-op visit in two to four weeks. If you haven't already done so, please call to make your appointment as soon as possible.  Dilation and Curettage or Vacuum Curettage, Care After These instructions give you information on caring for yourself after your procedure. Your doctor may also give you more specific instructions. Call your doctor if you have any problems or questions after your procedure. HOME CARE Do not drive for 24 hours. Wait 1 week before doing any activities that wear you out. Do not stand for a long time. Limit stair climbing to once or twice a day. Rest often. Continue with your usual diet. Drink enough fluids to keep your pee (urine) clear or pale yellow. If you have a hard time pooping (constipation), you may: Take a medicine to help you go poop (laxative) as told by your doctor. Eat more fruit and bran. Drink more fluids. Take showers, not baths, for as long as told by your doctor. Do not swim or use a hot tub until your doctor says it is okay. Have someone with you for 1day after the procedure. Do not douche, use tampons, or have sex (intercourse) until seen by your doctor Only take medicines as told by your doctor. Do not take aspirin. It can cause bleeding. Keep all doctor visits. GET HELP IF: You have cramps or pain not helped by medicine. You have new pain in the belly (abdomen). You have a bad smelling fluid coming from your vagina. You have a rash. You have problems with any medicine. GET HELP RIGHT AWAY IF:  You start to bleed more than a regular period. You have a fever. You have chest pain. You have trouble breathing. You feel dizzy or feel like passing out (fainting). You pass out. You have pain in the tops of your shoulders. You have vaginal bleeding with or without clumps of blood (blood clots). MAKE SURE YOU: Understand these instructions. Will watch your condition. Will get help  right away if you are not doing well or get worse. Document Released: 01/18/2008 Document Revised: 04/15/2013 Document Reviewed: 11/07/2012 ExitCare Patient Information 2015 [employer]. This information is not intended to replace advice given to you by your health care provider. Make sure you discuss any questions you have with your health care provider.   

## 2022-07-06 ENCOUNTER — Encounter (HOSPITAL_COMMUNITY): Payer: Self-pay | Admitting: Obstetrics and Gynecology

## 2022-07-06 LAB — RH IG WORKUP (INCLUDES ABO/RH)
ABO/RH(D): O NEG
Antibody Screen: NEGATIVE
Gestational Age(Wks): 6
Unit division: 0

## 2022-07-06 LAB — SURGICAL PATHOLOGY

## 2022-07-20 ENCOUNTER — Encounter (HOSPITAL_BASED_OUTPATIENT_CLINIC_OR_DEPARTMENT_OTHER): Payer: Self-pay | Admitting: Obstetrics & Gynecology

## 2022-07-20 ENCOUNTER — Ambulatory Visit (INDEPENDENT_AMBULATORY_CARE_PROVIDER_SITE_OTHER): Payer: Federal, State, Local not specified - PPO | Admitting: Obstetrics & Gynecology

## 2022-07-20 ENCOUNTER — Other Ambulatory Visit (HOSPITAL_COMMUNITY)
Admission: RE | Admit: 2022-07-20 | Discharge: 2022-07-20 | Disposition: A | Discharge status: Home / Self Care | Payer: Federal, State, Local not specified - PPO | Source: Ambulatory Visit | Attending: Obstetrics & Gynecology | Admitting: Obstetrics & Gynecology

## 2022-07-20 VITALS — BP 127/79 | HR 91 | Ht 60.0 in | Wt 146.4 lb

## 2022-07-20 DIAGNOSIS — E109 Type 1 diabetes mellitus without complications: Secondary | ICD-10-CM

## 2022-07-20 DIAGNOSIS — Z113 Encounter for screening for infections with a predominantly sexual mode of transmission: Secondary | ICD-10-CM | POA: Diagnosis not present

## 2022-07-20 DIAGNOSIS — O26899 Other specified pregnancy related conditions, unspecified trimester: Secondary | ICD-10-CM

## 2022-07-20 DIAGNOSIS — F1290 Cannabis use, unspecified, uncomplicated: Secondary | ICD-10-CM

## 2022-07-20 DIAGNOSIS — Z30011 Encounter for initial prescription of contraceptive pills: Secondary | ICD-10-CM

## 2022-07-20 DIAGNOSIS — O034 Incomplete spontaneous abortion without complication: Secondary | ICD-10-CM

## 2022-07-20 DIAGNOSIS — Z3A00 Weeks of gestation of pregnancy not specified: Secondary | ICD-10-CM

## 2022-07-20 DIAGNOSIS — Z6791 Unspecified blood type, Rh negative: Secondary | ICD-10-CM

## 2022-07-20 MED ORDER — NORETHIN ACE-ETH ESTRAD-FE 1-20 MG-MCG PO TABS: 1.0000 | ORAL_TABLET | Freq: Every day | ORAL | 3 refills | Status: DC

## 2022-07-20 NOTE — Patient Instructions (Signed)
[place]: Local: [phone] Toll-Free: [phone]

## 2022-07-20 NOTE — Progress Notes (Signed)
GYNECOLOGY  VISIT  CC:   post op and STI testing  HPI: 22 y.o. G49P0010 Single Black or [place] American female here for post op visit after undergoing D&E following episode of retained products from elective Ab.  Pt reports she is feeling much better.  Bleeding and discharge has stopped.  No fevers.  Blood sugars still not great.  Is followed by Dr. Kelton Pillar.  Has follow up scheduled in September.  Pathology reviewed.  No villi noted.  HCG will need to be followed.  Also, pt has not had recent STI testing and did have new partner before elective ab.  Recommended vaginitis testing today as well.  Pt comfortable with plan.  Last pap normal 06/24/2021.  Reviewed 3 year follow up guidelines with pt.    Blood type is O negative.  Was given Rhogam on 07/05/2022.  Pt does not want pregnancy any time soon. Would like to start OCP.  No hx of hypertension.  Has been on OCP in the past.  Feels she will be compliant with taking regularly.  Was on higher dosed OCP in the past.  Pros/cons discussed including risks (DVT/PE, stroke, MI).  Will use lower dosed estrogen combination OCP for pt.  Advised to start taking with onset of cycle.  Also, pt need   Past Medical History:  Diagnosis Date   Allergic rhinitis 07/07/2021   COVID 2020   mild   Diabetes mellitus ([place])    Type 1, type 5   Dysmenorrhea 02/20/2019   GERD (gastroesophageal reflux disease)    Headache    History of herpes genitalis 06/24/2021   Intractable vomiting    SIRS (systemic inflammatory response syndrome) ([place]) 12/03/2019    MEDS:   Current Outpatient Medications on File Prior to Visit  Medication Sig Dispense Refill   ACCU-CHEK GUIDE test strip Use to check your blood sugar 4 times a day (before meals and at bedtime) 100 each 11   Accu-Chek Softclix Lancets lancets SMARTSIG:Topical 1-4 Times Daily     blood glucose meter kit and supplies Dispense based on patient and insurance preference. Use up to four times daily as directed.  (FOR ICD-10 E10.9, E11.9). 1 each 0   capsicum (ZOSTRIX) 0.075 % topical cream Apply 1 application. topically 2 (two) times daily. 120 g 1   Continuous Blood Gluc Sensor (DEXCOM G6 SENSOR) MISC USE AS DIRECTED. 9 each 3   Continuous Blood Gluc Transmit (DEXCOM G6 TRANSMITTER) MISC 1 Device by Does not apply route as directed. 1 each 3   insulin aspart (NOVOLOG) 100 UNIT/ML injection Max Daily 60 units per pump 60 mL 11   norgestimate-ethinyl estradiol (ORTHO-CYCLEN) 0.25-35 MG-MCG tablet Take 1 tablet by mouth daily. 28 tablet 11   ondansetron (ZOFRAN-ODT) 8 MG disintegrating tablet Take 1 tablet (8 mg total) by mouth every 8 (eight) hours as needed for nausea or vomiting. 20 tablet 0   dicyclomine (BENTYL) 20 MG tablet Take 1 tablet (20 mg total) by mouth 2 (two) times daily. (Patient not taking: Reported on 07/04/2022) 20 tablet 0   fluticasone (FLONASE) 50 MCG/ACT nasal spray Place 2 sprays into both nostrils daily. (Patient not taking: Reported on 07/04/2022) 16 g 6   ibuprofen (MOTRIN IB) 200 MG tablet Take 3 tablets (600 mg total) by mouth every 6 (six) hours as needed. 30 tablet 0   pantoprazole (PROTONIX) 40 MG tablet Take 1 tablet (40 mg total) by mouth daily. 30 tablet 0   No current facility-administered medications on file  prior to visit.    ALLERGIES: Patient has no known allergies.  SH:  single, smokes cigarettes occasional and uses marijuana  Review of Systems  Constitutional: Negative.   Genitourinary: Negative.     PHYSICAL EXAMINATION:    BP 127/79   Pulse 91   Ht 5' (1.524 m)   Wt 146 lb 6.4 oz (66.4 kg)   LMP 07/02/2022 (Approximate)   BMI 28.59 kg/m     General appearance: alert, cooperative and appears stated age CV:  Regular rate and rhythm Lungs:  clear to auscultation, no wheezes, rales or rhonchi, symmetric air entry Abdomen: soft, non-tender; bowel sounds normal; no masses,  no organomegaly  Assessment/Plan: 1. Retained products of conception following  abortion - HCG levels will be followed to ensure they are now normal given negative pathology for villi - Beta hCG quant (ref lab); Future  2. Screening examination for STD (sexually transmitted disease) - will test for GC/Cl today - Cervicovaginal ancillary only( [place])  3. Type 1 diabetes mellitus without complication (HCC) - pt will follow up with Dr. Kelton Pillar  4. Marijuana use  5. Rh negative state in antepartum period - given Rhogram  6. Encounter for initial prescription of contraceptive pills - will start Loestrin 1/20 FE with next menstrual cycle.  Recommend blood pressure check in 6-8 weeks after starting.  Risks/benefits reviewed with pt.

## 2022-07-21 LAB — CERVICOVAGINAL ANCILLARY ONLY
Chlamydia: NEGATIVE
Comment: NEGATIVE
Comment: NEGATIVE
Comment: NORMAL
Neisseria Gonorrhea: NEGATIVE
Trichomonas: NEGATIVE

## 2022-07-22 ENCOUNTER — Encounter (HOSPITAL_BASED_OUTPATIENT_CLINIC_OR_DEPARTMENT_OTHER): Payer: Self-pay | Admitting: Obstetrics & Gynecology

## 2022-07-25 ENCOUNTER — Other Ambulatory Visit (HOSPITAL_BASED_OUTPATIENT_CLINIC_OR_DEPARTMENT_OTHER): Payer: Federal, State, Local not specified - PPO

## 2022-07-25 DIAGNOSIS — O034 Incomplete spontaneous abortion without complication: Secondary | ICD-10-CM

## 2022-07-26 LAB — BETA HCG QUANT (REF LAB): hCG Quant: 4 m[IU]/mL

## 2022-08-31 DIAGNOSIS — E1065 Type 1 diabetes mellitus with hyperglycemia: Secondary | ICD-10-CM | POA: Diagnosis not present

## 2022-09-04 ENCOUNTER — Inpatient Hospital Stay (HOSPITAL_COMMUNITY)
Admission: EM | Admit: 2022-09-04 | Discharge: 2022-09-08 | DRG: 638 | Disposition: A | Discharge status: Home / Self Care | Payer: Federal, State, Local not specified - PPO | Attending: Internal Medicine | Admitting: Internal Medicine

## 2022-09-04 ENCOUNTER — Emergency Department (HOSPITAL_COMMUNITY): Payer: Federal, State, Local not specified - PPO

## 2022-09-04 DIAGNOSIS — Z8616 Personal history of COVID-19: Secondary | ICD-10-CM

## 2022-09-04 DIAGNOSIS — K529 Noninfective gastroenteritis and colitis, unspecified: Secondary | ICD-10-CM

## 2022-09-04 DIAGNOSIS — Z813 Family history of other psychoactive substance abuse and dependence: Secondary | ICD-10-CM

## 2022-09-04 DIAGNOSIS — R17 Unspecified jaundice: Secondary | ICD-10-CM | POA: Diagnosis not present

## 2022-09-04 DIAGNOSIS — Z818 Family history of other mental and behavioral disorders: Secondary | ICD-10-CM | POA: Diagnosis not present

## 2022-09-04 DIAGNOSIS — Z833 Family history of diabetes mellitus: Secondary | ICD-10-CM

## 2022-09-04 DIAGNOSIS — R739 Hyperglycemia, unspecified: Secondary | ICD-10-CM | POA: Diagnosis not present

## 2022-09-04 DIAGNOSIS — Z9641 Presence of insulin pump (external) (internal): Secondary | ICD-10-CM | POA: Diagnosis not present

## 2022-09-04 DIAGNOSIS — E871 Hypo-osmolality and hyponatremia: Secondary | ICD-10-CM | POA: Diagnosis not present

## 2022-09-04 DIAGNOSIS — R109 Unspecified abdominal pain: Secondary | ICD-10-CM | POA: Diagnosis not present

## 2022-09-04 DIAGNOSIS — Z8249 Family history of ischemic heart disease and other diseases of the circulatory system: Secondary | ICD-10-CM

## 2022-09-04 DIAGNOSIS — E1165 Type 2 diabetes mellitus with hyperglycemia: Secondary | ICD-10-CM | POA: Diagnosis not present

## 2022-09-04 DIAGNOSIS — E860 Dehydration: Secondary | ICD-10-CM

## 2022-09-04 DIAGNOSIS — E876 Hypokalemia: Secondary | ICD-10-CM | POA: Diagnosis not present

## 2022-09-04 DIAGNOSIS — R064 Hyperventilation: Secondary | ICD-10-CM | POA: Diagnosis not present

## 2022-09-04 DIAGNOSIS — Z79899 Other long term (current) drug therapy: Secondary | ICD-10-CM

## 2022-09-04 DIAGNOSIS — E1010 Type 1 diabetes mellitus with ketoacidosis without coma: Secondary | ICD-10-CM | POA: Diagnosis not present

## 2022-09-04 DIAGNOSIS — R1084 Generalized abdominal pain: Secondary | ICD-10-CM | POA: Diagnosis not present

## 2022-09-04 DIAGNOSIS — Z811 Family history of alcohol abuse and dependence: Secondary | ICD-10-CM

## 2022-09-04 DIAGNOSIS — K219 Gastro-esophageal reflux disease without esophagitis: Secondary | ICD-10-CM | POA: Diagnosis present

## 2022-09-04 DIAGNOSIS — F17210 Nicotine dependence, cigarettes, uncomplicated: Secondary | ICD-10-CM | POA: Diagnosis present

## 2022-09-04 DIAGNOSIS — E1065 Type 1 diabetes mellitus with hyperglycemia: Secondary | ICD-10-CM

## 2022-09-04 DIAGNOSIS — Z8639 Personal history of other endocrine, nutritional and metabolic disease: Secondary | ICD-10-CM | POA: Diagnosis present

## 2022-09-04 DIAGNOSIS — D75838 Other thrombocytosis: Secondary | ICD-10-CM | POA: Diagnosis not present

## 2022-09-04 DIAGNOSIS — E873 Alkalosis: Secondary | ICD-10-CM | POA: Diagnosis present

## 2022-09-04 DIAGNOSIS — Z83438 Family history of other disorder of lipoprotein metabolism and other lipidemia: Secondary | ICD-10-CM

## 2022-09-04 DIAGNOSIS — E1110 Type 2 diabetes mellitus with ketoacidosis without coma: Secondary | ICD-10-CM | POA: Diagnosis present

## 2022-09-04 DIAGNOSIS — Z1152 Encounter for screening for COVID-19: Secondary | ICD-10-CM | POA: Diagnosis not present

## 2022-09-04 DIAGNOSIS — R1013 Epigastric pain: Secondary | ICD-10-CM | POA: Diagnosis not present

## 2022-09-04 DIAGNOSIS — R9431 Abnormal electrocardiogram [ECG] [EKG]: Secondary | ICD-10-CM | POA: Diagnosis not present

## 2022-09-04 DIAGNOSIS — R42 Dizziness and giddiness: Secondary | ICD-10-CM | POA: Diagnosis not present

## 2022-09-04 LAB — COMPREHENSIVE METABOLIC PANEL
ALT: 26 U/L (ref 0–44)
AST: 33 U/L (ref 15–41)
Albumin: 4 g/dL (ref 3.5–5.0)
Alkaline Phosphatase: 80 U/L (ref 38–126)
Anion gap: 17 — ABNORMAL HIGH (ref 5–15)
BUN: 9 mg/dL (ref 6–20)
CO2: 15 mmol/L — ABNORMAL LOW (ref 22–32)
Calcium: 8.9 mg/dL (ref 8.9–10.3)
Chloride: 99 mmol/L (ref 98–111)
Creatinine, Ser: 0.91 mg/dL (ref 0.44–1.00)
GFR, Estimated: >60 mL/min (ref >60)
Glucose, Bld: 461 mg/dL — ABNORMAL HIGH (ref 70–99)
Potassium: 3.7 mmol/L (ref 3.5–5.1)
Sodium: 131 mmol/L — ABNORMAL LOW (ref 135–145)
Total Bilirubin: 1.1 mg/dL (ref 0.3–1.2)
Total Protein: 7.6 g/dL (ref 6.5–8.1)

## 2022-09-04 LAB — BASIC METABOLIC PANEL
Anion gap: 15 (ref 5–15)
Anion gap: 12 (ref 5–15)
Anion gap: 19 — ABNORMAL HIGH (ref 5–15)
BUN: 6 mg/dL (ref 6–20)
BUN: 7 mg/dL (ref 6–20)
BUN: 10 mg/dL (ref 6–20)
CO2: 16 mmol/L — ABNORMAL LOW (ref 22–32)
CO2: 17 mmol/L — ABNORMAL LOW (ref 22–32)
CO2: 13 mmol/L — ABNORMAL LOW (ref 22–32)
Calcium: 8.7 mg/dL — ABNORMAL LOW (ref 8.9–10.3)
Calcium: 8.2 mg/dL — ABNORMAL LOW (ref 8.9–10.3)
Calcium: 9 mg/dL (ref 8.9–10.3)
Chloride: 101 mmol/L (ref 98–111)
Chloride: 101 mmol/L (ref 98–111)
Chloride: 100 mmol/L (ref 98–111)
Creatinine, Ser: 0.91 mg/dL (ref 0.44–1.00)
Creatinine, Ser: 0.83 mg/dL (ref 0.44–1.00)
Creatinine, Ser: 0.95 mg/dL (ref 0.44–1.00)
GFR, Estimated: >60 mL/min (ref >60)
GFR, Estimated: >60 mL/min (ref >60)
GFR, Estimated: >60 mL/min (ref >60)
Glucose, Bld: 365 mg/dL — ABNORMAL HIGH (ref 70–99)
Glucose, Bld: 434 mg/dL — ABNORMAL HIGH (ref 70–99)
Glucose, Bld: 466 mg/dL — ABNORMAL HIGH (ref 70–99)
Potassium: 3.8 mmol/L (ref 3.5–5.1)
Potassium: 4.2 mmol/L (ref 3.5–5.1)
Potassium: 4 mmol/L (ref 3.5–5.1)
Sodium: 132 mmol/L — ABNORMAL LOW (ref 135–145)
Sodium: 130 mmol/L — ABNORMAL LOW (ref 135–145)
Sodium: 132 mmol/L — ABNORMAL LOW (ref 135–145)

## 2022-09-04 LAB — CBC WITH DIFFERENTIAL/PLATELET
Abs Immature Granulocytes: 0.03 10*3/uL (ref 0.00–0.07)
Basophils Absolute: 0 10*3/uL (ref 0.0–0.1)
Basophils Relative: 0 %
Eosinophils Absolute: 0 10*3/uL (ref 0.0–0.5)
Eosinophils Relative: 0 %
HCT: 39.1 % (ref 36.0–46.0)
Hemoglobin: 12.7 g/dL (ref 12.0–15.0)
Immature Granulocytes: 0 %
Lymphocytes Relative: 11 %
Lymphs Abs: 0.9 10*3/uL (ref 0.7–4.0)
MCH: 27.9 pg (ref 26.0–34.0)
MCHC: 32.5 g/dL (ref 30.0–36.0)
MCV: 85.9 fL (ref 80.0–100.0)
Monocytes Absolute: 0.6 10*3/uL (ref 0.1–1.0)
Monocytes Relative: 7 %
Neutro Abs: 6.5 10*3/uL (ref 1.7–7.7)
Neutrophils Relative %: 82 %
Platelets: 428 10*3/uL — ABNORMAL HIGH (ref 150–400)
RBC: 4.55 MIL/uL (ref 3.87–5.11)
RDW: 14.6 % (ref 11.5–15.5)
WBC: 8 10*3/uL (ref 4.0–10.5)
nRBC: 0 % (ref 0.0–0.2)

## 2022-09-04 LAB — RESP PANEL BY RT-PCR (RSV, FLU A&B, COVID)  RVPGX2
Influenza A by PCR: NEGATIVE
Influenza B by PCR: NEGATIVE
Resp Syncytial Virus by PCR: NEGATIVE
SARS Coronavirus 2 by RT PCR: NEGATIVE

## 2022-09-04 LAB — HEPATIC FUNCTION PANEL
ALT: 22 U/L (ref 0–44)
AST: 36 U/L (ref 15–41)
Albumin: 3.6 g/dL (ref 3.5–5.0)
Alkaline Phosphatase: 70 U/L (ref 38–126)
Bilirubin, Direct: 0.3 mg/dL — ABNORMAL HIGH (ref 0.0–0.2)
Indirect Bilirubin: 1 mg/dL — ABNORMAL HIGH (ref 0.3–0.9)
Total Bilirubin: 1.3 mg/dL — ABNORMAL HIGH (ref 0.3–1.2)
Total Protein: 7.1 g/dL (ref 6.5–8.1)

## 2022-09-04 LAB — URINALYSIS, ROUTINE W REFLEX MICROSCOPIC
Bacteria, UA: NONE SEEN
Bilirubin Urine: NEGATIVE
Glucose, UA: >=500 mg/dL — AB
Hgb urine dipstick: NEGATIVE
Ketones, ur: 80 mg/dL — AB
Leukocytes,Ua: NEGATIVE
Nitrite: NEGATIVE
Protein, ur: NEGATIVE mg/dL
Specific Gravity, Urine: 1.015 (ref 1.005–1.030)
pH: 7 (ref 5.0–8.0)

## 2022-09-04 LAB — GLUCOSE, CAPILLARY
Glucose-Capillary: 194 mg/dL — ABNORMAL HIGH (ref 70–99)
Glucose-Capillary: 195 mg/dL — ABNORMAL HIGH (ref 70–99)

## 2022-09-04 LAB — CBG MONITORING, ED
Glucose-Capillary: 310 mg/dL — ABNORMAL HIGH (ref 70–99)
Glucose-Capillary: 430 mg/dL — ABNORMAL HIGH (ref 70–99)
Glucose-Capillary: 519 mg/dL (ref 70–99)
Glucose-Capillary: 395 mg/dL — ABNORMAL HIGH (ref 70–99)
Glucose-Capillary: 297 mg/dL — ABNORMAL HIGH (ref 70–99)

## 2022-09-04 LAB — BLOOD GAS, VENOUS
Acid-base deficit: 4.7 mmol/L — ABNORMAL HIGH (ref 0.0–2.0)
Bicarbonate: 16 mmol/L — ABNORMAL LOW (ref 20.0–28.0)
O2 Saturation: 56.6 %
Patient temperature: 37
pCO2, Ven: 20 mmHg — ABNORMAL LOW (ref 44–60)
pH, Ven: 7.51 — ABNORMAL HIGH (ref 7.25–7.43)
pO2, Ven: <31 mmHg — CL (ref 32–45)

## 2022-09-04 LAB — PHOSPHORUS: Phosphorus: 4 mg/dL (ref 2.5–4.6)

## 2022-09-04 LAB — I-STAT BETA HCG BLOOD, ED (MC, WL, AP ONLY): I-stat hCG, quantitative: <5 m[IU]/mL (ref <5)

## 2022-09-04 LAB — BETA-HYDROXYBUTYRIC ACID
Beta-Hydroxybutyric Acid: 1.86 mmol/L — ABNORMAL HIGH (ref 0.05–0.27)
Beta-Hydroxybutyric Acid: 3.53 mmol/L — ABNORMAL HIGH (ref 0.05–0.27)

## 2022-09-04 LAB — LIPASE, BLOOD: Lipase: 22 U/L (ref 11–51)

## 2022-09-04 LAB — MRSA NEXT GEN BY PCR, NASAL: MRSA by PCR Next Gen: NOT DETECTED

## 2022-09-04 LAB — MAGNESIUM: Magnesium: 1.7 mg/dL (ref 1.7–2.4)

## 2022-09-04 MED ORDER — INSULIN REGULAR(HUMAN) IN NACL 100-0.9 UT/100ML-% IV SOLN: INTRAVENOUS | Status: DC

## 2022-09-04 MED ORDER — LACTATED RINGERS IV SOLN: INTRAVENOUS | Status: DC

## 2022-09-04 MED ORDER — ENOXAPARIN SODIUM 40 MG/0.4ML IJ SOSY
40.0000 mg | PREFILLED_SYRINGE | INTRAMUSCULAR | Status: DC
  Administered 2022-09-04 – 2022-09-07 (×4): 40 mg via SUBCUTANEOUS
  Filled 2022-09-04 (×4): qty 0.4

## 2022-09-04 MED ORDER — MORPHINE SULFATE (PF) 2 MG/ML IV SOLN
2.0000 mg | INTRAVENOUS | Status: DC | PRN
  Administered 2022-09-04: 2 mg via INTRAVENOUS
  Filled 2022-09-04: qty 1

## 2022-09-04 MED ORDER — FENTANYL CITRATE PF 50 MCG/ML IJ SOSY
50.0000 ug | PREFILLED_SYRINGE | Freq: Once | INTRAMUSCULAR | Status: AC
  Administered 2022-09-04: 50 ug via INTRAVENOUS
  Filled 2022-09-04: qty 1

## 2022-09-04 MED ORDER — ONDANSETRON HCL 4 MG/2ML IJ SOLN
4.0000 mg | Freq: Four times a day (QID) | INTRAMUSCULAR | Status: DC | PRN
  Administered 2022-09-04 – 2022-09-08 (×7): 4 mg via INTRAVENOUS
  Filled 2022-09-04 (×7): qty 2

## 2022-09-04 MED ORDER — FAMOTIDINE IN NACL 20-0.9 MG/50ML-% IV SOLN
20.0000 mg | Freq: Two times a day (BID) | INTRAVENOUS | Status: DC
  Administered 2022-09-04 – 2022-09-08 (×8): 20 mg via INTRAVENOUS
  Filled 2022-09-04 (×8): qty 50

## 2022-09-04 MED ORDER — DEXTROSE IN LACTATED RINGERS 5 % IV SOLN: INTRAVENOUS | Status: DC

## 2022-09-04 MED ORDER — KETOROLAC TROMETHAMINE 15 MG/ML IJ SOLN
15.0000 mg | Freq: Once | INTRAMUSCULAR | Status: AC
  Administered 2022-09-04: 15 mg via INTRAVENOUS
  Filled 2022-09-04: qty 1

## 2022-09-04 MED ORDER — IOHEXOL 300 MG/ML  SOLN
100.0000 mL | Freq: Once | INTRAMUSCULAR | Status: AC | PRN
  Administered 2022-09-04: 100 mL via INTRAVENOUS

## 2022-09-04 MED ORDER — DEXTROSE 50 % IV SOLN: 0.0000 mL | INTRAVENOUS | Status: DC | PRN

## 2022-09-04 MED ORDER — LACTATED RINGERS IV BOLUS
1000.0000 mL | INTRAVENOUS | Status: AC
  Administered 2022-09-04 (×2): 1000 mL via INTRAVENOUS

## 2022-09-04 MED ORDER — CHLORHEXIDINE GLUCONATE CLOTH 2 % EX PADS
6.0000 | MEDICATED_PAD | Freq: Every day | CUTANEOUS | Status: DC
  Administered 2022-09-05 – 2022-09-08 (×3): 6 via TOPICAL

## 2022-09-04 MED ORDER — INSULIN REGULAR(HUMAN) IN NACL 100-0.9 UT/100ML-% IV SOLN
INTRAVENOUS | Status: DC
  Administered 2022-09-04: 10 [IU]/h via INTRAVENOUS
  Administered 2022-09-05: 3 [IU]/h via INTRAVENOUS
  Filled 2022-09-04 (×2): qty 100

## 2022-09-04 MED ORDER — NORETHIN ACE-ETH ESTRAD-FE 1-20 MG-MCG PO TABS
1.0000 | ORAL_TABLET | Freq: Every day | ORAL | Status: DC
  Administered 2022-09-06 – 2022-09-08 (×3): 1 via ORAL

## 2022-09-04 MED ORDER — POTASSIUM CHLORIDE 10 MEQ/100ML IV SOLN
10.0000 meq | INTRAVENOUS | Status: AC
  Administered 2022-09-04 (×2): 10 meq via INTRAVENOUS
  Filled 2022-09-04 (×2): qty 100

## 2022-09-04 MED ORDER — ONDANSETRON HCL 4 MG/2ML IJ SOLN
4.0000 mg | Freq: Once | INTRAMUSCULAR | Status: AC
  Administered 2022-09-04: 4 mg via INTRAVENOUS
  Filled 2022-09-04: qty 2

## 2022-09-04 NOTE — ED Provider Notes (Addendum)
Judy Cook initially seen by Dr. Florentina Addison.  Please see his note.  His presentation concerning for evolving diabetic ketoacidosis.  Judy Cook does have decreased bicarb.  She does have an elevated beta hydroxy butyric acid.  Initial pH did not show signs of acidosis.  Plan was to recheck metabolic panel after IV hydration.  Judy Cook's blood sugar is increasing.  Anion gap is also increasing.  Will start Judy Cook on IV insulin infusion.  I will consult the medical service for admission   Linwood Dibbles, MD 09/04/22 1853  Case discussed with Dr Dayna Barker regarding admission   Linwood Dibbles, MD 09/04/22 2018

## 2022-09-04 NOTE — Hospital Course (Addendum)
22 year old female with  T1DM on insulin pump presents to the ED with nausea vomiting abdominal discomfort.  She reported that her insulin pump is not functioning as she ran out of the supplies since 5/12 lat night. She has abdominal discomfort in the epigastric area, was worried she might be pregnant, LMP last week. She states her last DKA was > 1 year ago and blood sugar runs in 100-180 on her insulin pump. She was having dizziness and felt dehydrated . She has had loose stool about 10 today, without blood, is liquidly. She has had nausea but no vomiting, some chills and shortness of breath due to he abdomne pain.  Patient otherwise denies any chest pain, fever, headache, focal weakness, numbness tingling, speech difficulties.    In the WG:NFAOZH with mild tachypnea 24 otherwise BP stable no hypoxia, heart rate in 70s to 80s.CBC with thrombocytosis 428, UA with glucose more than 500, ketones 80 i-STAT hCG less than 5, UA WBC 0-5.  Lipase normal mildly elevated LFTs. VBG with pH 7.5 pCO2 20  CT Abd/Plvis: "Diffuse well wall thickening of the otherwise empty colon favoring colitis. No pneumatosis or portal venous gas.The pancreas appears normal. Please note that lipase levels may be more sensitive than CT for detecting early acute pancreatitis. Periosteal reaction along the left lateral seventh rib could indicate a healing rib fracture".  Blood sugar in 400s, Bhob 1.86, initially around 1 PM bicarb 16 anion gap 15,  given 1 liter LR x 2, repeat BMP around 6 pm- showed increased to 17, bicarb 15 but aroudn 8 pm labs with bicarb at 13, blood glucose 466, anion gap 19. Marland Kitchen She was placed on insulin drip and admission was requested for further management. Currently asking for stronger pain medication as toradol only provided some relief.

## 2022-09-04 NOTE — ED Triage Notes (Signed)
Pt BIBA from home c/o N/V, abd pain, Insulin pump not functioning properly, last time receiving was yesterday.  400 mL NS fluids. 4 mg of Zofran. 20g left AC. Possible pregnancy.   BP 158/100 HR 76 100 RA CBG 362

## 2022-09-04 NOTE — ED Notes (Signed)
Pt unable to provide urine sample.

## 2022-09-04 NOTE — ED Provider Notes (Signed)
[place] EMERGENCY DEPARTMENT AT [facility] Provider Note   CSN: [id number] Arrival date & time: 09/04/22  1235     History  No chief complaint on file.   Judy Cook is a 22 y.o. female.  HPI   22 year old female with medical history significant for type 1 diabetes who presents to the emergency department with nausea, vomiting, abdominal discomfort.  The patient states that her insulin pump is not functioning because she ran out of supplies for her insulin pump.  She last received insulin yesterday.  She endorses epigastric abdominal discomfort. She is worried she might be pregnant. LMP last week.   Home Medications Prior to Admission medications   Medication Sig Start Date End Date Taking? Authorizing Provider  insulin aspart (NOVOLOG) 100 UNIT/ML injection Max Daily 60 units per pump 06/08/22  Yes Shamleffer, Konrad Dolores, MD  norethindrone-ethinyl estradiol-FE (LOESTRIN FE) 1-20 MG-MCG tablet Take 1 tablet by mouth daily. 07/20/22  Yes Jerene Bears, MD  ACCU-CHEK GUIDE test strip Use to check your blood sugar 4 times a day (before meals and at bedtime) Patient not taking: Reported on 09/04/2022 06/02/21   Elenore Paddy, NP  blood glucose meter kit and supplies Dispense based on patient and insurance preference. Use up to four times daily as directed. (FOR ICD-10 E10.9, E11.9). Patient not taking: Reported on 09/04/2022 01/25/21   Jacalyn Lefevre, MD  capsicum (ZOSTRIX) 0.075 % topical cream Apply 1 application. topically 2 (two) times daily. Patient not taking: Reported on 09/04/2022 08/07/21   Theadora Rama Scales, PA-C  Continuous Blood Gluc Sensor (DEXCOM G6 SENSOR) MISC USE AS DIRECTED. Patient not taking: Reported on 09/04/2022 06/18/22   Shamleffer, Konrad Dolores, MD  Continuous Blood Gluc Transmit (DEXCOM G6 TRANSMITTER) MISC 1 Device by Does not apply route as directed. Patient not taking: Reported on 09/04/2022 07/26/21   Shamleffer, Konrad Dolores,  MD  dicyclomine (BENTYL) 20 MG tablet Take 1 tablet (20 mg total) by mouth 2 (two) times daily. Patient not taking: Reported on 07/04/2022 06/15/22   Elayne Snare K, DO  fluticasone [facility]) 50 MCG/ACT nasal spray Place 2 sprays into both nostrils daily. Patient not taking: Reported on 07/04/2022 07/07/21   Elenore Paddy, NP  ibuprofen (MOTRIN IB) 200 MG tablet Take 3 tablets (600 mg total) by mouth every 6 (six) hours as needed. Patient not taking: Reported on 09/04/2022 07/05/22   [place] Bing, MD  norgestimate-ethinyl estradiol (ORTHO-CYCLEN) 0.25-35 MG-MCG tablet Take 1 tablet by mouth daily. Patient not taking: Reported on 09/04/2022 06/24/21   Marny Lowenstein, PA-C  ondansetron (ZOFRAN-ODT) 8 MG disintegrating tablet Take 1 tablet (8 mg total) by mouth every 8 (eight) hours as needed for nausea or vomiting. Patient not taking: Reported on 09/04/2022 06/17/22   Theron Arista, PA-C  pantoprazole (PROTONIX) 40 MG tablet Take 1 tablet (40 mg total) by mouth daily. Patient not taking: Reported on 09/04/2022 12/07/19 01/06/20  Lanae Boast, MD      Allergies    Patient has no known allergies.    Review of Systems   Review of Systems  All other systems reviewed and are negative.   Physical Exam Updated Vital Signs BP (!) 142/83 (BP Location: Right Arm)   Pulse 76   Temp 98.1 F (36.7 C) (Oral)   Resp 14   Ht 5' (1.524 m)   Wt 65.8 kg   SpO2 100%   BMI 28.32 kg/m  Physical Exam Vitals and nursing note reviewed.  Constitutional:  General: She is not in acute distress.    Appearance: She is well-developed.  HENT:     Head: Normocephalic and atraumatic.  Eyes:     Conjunctiva/sclera: Conjunctivae normal.  Cardiovascular:     Rate and Rhythm: Normal rate and regular rhythm.     Heart sounds: No murmur heard. Pulmonary:     Effort: Pulmonary effort is normal. No respiratory distress.     Breath sounds: Normal breath sounds.  Abdominal:     Palpations: Abdomen is soft.      Tenderness: There is abdominal tenderness in the epigastric area. There is guarding. There is no rebound.  Musculoskeletal:        General: No swelling.     Cervical back: Neck supple.  Skin:    General: Skin is warm and dry.     Capillary Refill: Capillary refill takes less than 2 seconds.  Neurological:     Mental Status: She is alert.  Psychiatric:        Mood and Affect: Mood normal.     ED Results / Procedures / Treatments   Labs (all labs ordered are listed, but only abnormal results are displayed) Labs Reviewed  BASIC METABOLIC PANEL - Abnormal; Notable for the following components:      Result Value   Sodium 132 (*)    CO2 16 (*)    Glucose, Bld 365 (*)    Calcium 8.7 (*)    All other components within normal limits  BASIC METABOLIC PANEL - Abnormal; Notable for the following components:   Sodium 130 (*)    CO2 17 (*)    Glucose, Bld 434 (*)    Calcium 8.2 (*)    All other components within normal limits  BETA-HYDROXYBUTYRIC ACID - Abnormal; Notable for the following components:   Beta-Hydroxybutyric Acid 1.86 (*)    All other components within normal limits  CBC WITH DIFFERENTIAL/PLATELET - Abnormal; Notable for the following components:   Platelets 428 (*)    All other components within normal limits  URINALYSIS, ROUTINE W REFLEX MICROSCOPIC - Abnormal; Notable for the following components:   Color, Urine COLORLESS (*)    Glucose, UA >=500 (*)    Ketones, ur 80 (*)    All other components within normal limits  BLOOD GAS, VENOUS - Abnormal; Notable for the following components:   pH, Ven 7.51 (*)    pCO2, Ven 20 (*)    pO2, Ven <31 (*)    Bicarbonate 16.0 (*)    Acid-base deficit 4.7 (*)    All other components within normal limits  HEPATIC FUNCTION PANEL - Abnormal; Notable for the following components:   Total Bilirubin 1.3 (*)    Bilirubin, Direct 0.3 (*)    Indirect Bilirubin 1.0 (*)    All other components within normal limits  CBG MONITORING, ED -  Abnormal; Notable for the following components:   Glucose-Capillary 310 (*)    All other components within normal limits  CBG MONITORING, ED - Abnormal; Notable for the following components:   Glucose-Capillary 430 (*)    All other components within normal limits  RESP PANEL BY RT-PCR (RSV, FLU A&B, COVID)  RVPGX2  LIPASE, BLOOD  BASIC METABOLIC PANEL  BASIC METABOLIC PANEL  BETA-HYDROXYBUTYRIC ACID  BASIC METABOLIC PANEL  BETA-HYDROXYBUTYRIC ACID  I-STAT BETA HCG BLOOD, ED ([facility], [facility], [facility] ONLY)    EKG EKG Interpretation  Date/Time:  Monday Sep 04 2022 12:47:13 EDT Ventricular Rate:  72 PR Interval:  142  QRS Duration: 60 QT Interval:  419 QTC Calculation: 459 R Axis:   71 Text Interpretation: Sinus arrhythmia Baseline wander in lead(s) III aVF Confirmed by Ernie Avena ([id number]) on 09/04/2022 12:49:03 PM  Radiology CT ABDOMEN PELVIS W CONTRAST  Result Date: 09/04/2022 CLINICAL DATA:  Nausea vomiting and abdominal pain. Suspected pancreatitis. EXAM: CT ABDOMEN AND PELVIS WITH CONTRAST TECHNIQUE: Multidetector CT imaging of the abdomen and pelvis was performed using the standard protocol following bolus administration of intravenous contrast. RADIATION DOSE REDUCTION: This exam was performed according to the departmental dose-optimization program which includes automated exposure control, adjustment of the mA and/or kV according to patient size and/or use of iterative reconstruction technique. CONTRAST:  OMNIPAQUE IOHEXOL 300 MG/ML  SOLN COMPARISON:  12/06/2019 FINDINGS: Lower chest: Unremarkable Hepatobiliary: Imaging is performed prior to hepatic vein opacification. Gallbladder unremarkable. No biliary dilatation observed. No focal hepatic lesion identified. Pancreas: Unremarkable. No findings of pancreatic pseudocyst, abscess, or necrosis. No definite abnormal peripancreatic inflammatory stranding. Please note that lipase levels may be more sensitive than CT for detecting early acute  pancreatitis. Spleen: Unremarkable Adrenals/Urinary Tract: Unremarkable Stomach/Bowel: Diffuse well wall thickening of the otherwise empty colon favoring colitis. No pneumatosis or portal venous gas. No dilated small bowel. Tubular right lower quadrant structure favoring appendix adjacent to the cecum does not appear inflamed. Vascular/Lymphatic: Unremarkable Reproductive: Unremarkable Other: No supplemental non-categorized findings. Musculoskeletal: On images 2-3 of series 5 there is periosteal reaction along the left lateral seventh rib which could indicate a healing rib fracture. IMPRESSION: 1. Diffuse well wall thickening of the otherwise empty colon favoring colitis. No pneumatosis or portal venous gas. 2. The pancreas appears normal. Please note that lipase levels may be more sensitive than CT for detecting early acute pancreatitis. 3. Periosteal reaction along the left lateral seventh rib could indicate a healing rib fracture. Electronically Signed   By: Gaylyn Rong M.D.   On: 09/04/2022 16:47    Procedures Procedures    Medications Ordered in ED Medications  lactated ringers infusion (has no administration in time range)  lactated ringers bolus 1,000 mL (1,000 mLs Intravenous New Bag/Given 09/04/22 1655)  fentaNYL (SUBLIMAZE) injection 50 mcg (50 mcg Intravenous Given 09/04/22 1605)  ondansetron (ZOFRAN) injection 4 mg (4 mg Intravenous Given 09/04/22 1605)  iohexol (OMNIPAQUE) 300 MG/ML solution 100 mL (100 mLs Intravenous Contrast Given 09/04/22 1626)    ED Course/ Medical Decision Making/ A&P Clinical Course as of 09/04/22 1730  Mon Sep 04, 2022  1248 Glucose-Capillary(!): 310 [JL]  1545 I-stat hCG, quantitative: <5.0 [JL]  1546 pH, Ven(!): 7.51 [JL]  1546 pCO2, Ven(!): 20 [JL]  1546 Bicarbonate(!): 16.0 [JL]  1546 Beta-Hydroxybutyric Acid(!): 1.86 [JL]  1546 CO2(!): 16 [JL]  1546 Anion gap: 15 [JL]    Clinical Course User Index [JL] Ernie Avena, MD                              Medical Decision Making Amount and/or Complexity of Data Reviewed Labs: ordered. Decision-making details documented in ED Course. Radiology: ordered.  Risk Prescription drug management.    22 year old female with medical history significant for type 1 diabetes who presents to the emergency department with nausea, vomiting, abdominal discomfort.  The patient states that her insulin pump is not functioning because she ran out of supplies for her insulin pump.  She last received insulin yesterday.  She endorses epigastric abdominal discomfort. She is worried she might be pregnant. LMP  last week.   On arrival, the patient was afebrile, nontachycardic, hemodynamically stable, tachypneic RR 24, saturating well on room air.  Concern for DKA versus developing hyperglycemia and/or HHS.  Additionally considered pancreatitis.  Patient has run out of her insulin that was normally delivered via her insulin pump.  Endorses epigastric pain, nausea and vomiting.  Initial CBG revealed a blood glucose of 310, VBG revealed a respiratory alkalosis with a pH of 7.51, pCO2 of 20, metabolic acidosis as well with a bicarbonate of 16.  Beta hydroxybutyrate was elevated at 1.86.  CBC was without a leukocytosis or anemia, BMP revealed a non-anion gap acidosis with a bicarbonate of 16 and anion gap just borderline normal to elevated at 15.  Creatinine was normal.  Urinalysis without evidence of UTI, ketones present.  COVID-19, influenza, RSV PCR testing was collected and was negative.  An hCG was negative.  IV access was obtained and the patient was administered 2 L IV fluid bolus.  Patient's labs were not improving and were actually worsening despite fluid resuscitation with worsening blood glucose on repeat BMP to 434 with a bicarbonate persistently acidotic at 17, not quite DKA with an anion gap of 12.  CT Abdomen Pelvis:  IMPRESSION: 1. Diffuse well wall thickening of the otherwise empty colon favoring  colitis. No pneumatosis or portal venous gas. 2. The pancreas appears normal. Please note that lipase levels may be more sensitive than CT for detecting early acute pancreatitis. 3. Periosteal reaction along the left lateral seventh rib could indicate a healing rib fracture.   Lipase and hepatic function panel with normal lipase, mildly elevated direct, indirect and t bili, LFTs normal. Pt hyperglycemic and worsening however I was informed by nursing that she only received an initial 1L IVF bolus not 2L. Additional 1L was administered. Will plan for a recheck of the patient's labs following this fluid resuscitation. Signout given to Dr. Lynelle Doctor at 1700.   Final Clinical Impression(s) / ED Diagnoses Final diagnoses:  Hyperglycemia due to diabetes mellitus (HCC)  Dehydration  Gastroenteritis    Rx / DC Orders ED Discharge Orders     None         Ernie Avena, MD 09/04/22 1730

## 2022-09-04 NOTE — ED Notes (Signed)
Pt knows that she needs a stool sample and will get it the next time she goes to restroom

## 2022-09-04 NOTE — ED Notes (Signed)
US PIV placed. 

## 2022-09-04 NOTE — H&P (Signed)
History and Physical    Judy Cook ZOX:[id number] DOB: 2000/10/07 DOA: 09/04/2022  PCP: Elenore Paddy, NP   Patient coming from: Home  Chief complaints: Nausea vomiting abdominal pain  HPI: 22 year old female with  T1DM on insulin pump presents to the ED with nausea vomiting abdominal discomfort.  She reported that her insulin pump is not functioning as she ran out of the supplies since 5/12 lat night. She has abdominal discomfort in the epigastric area, was worried she might be pregnant, LMP last week. She states her last DKA was > 1 year ago and blood sugar runs in 100-180 on her insulin pump. She was having dizziness and felt dehydrated . She has had loose stool about 10 today, without blood, is liquidly. She has had nausea but no vomiting, some chills and shortness of breath due to he abdomne pain.  Patient otherwise denies any chest pain, fever, headache, focal weakness, numbness tingling, speech difficulties.    In the WJ:XBJYNW with mild tachypnea 24 otherwise BP stable no hypoxia, heart rate in 70s to 80s.CBC with thrombocytosis 428, UA with glucose more than 500, ketones 80 i-STAT hCG less than 5, UA WBC 0-5.  Lipase normal mildly elevated LFTs. VBG with pH 7.5 pCO2 20  CT Abd/Plvis: "Diffuse well wall thickening of the otherwise empty colon favoring colitis. No pneumatosis or portal venous gas.The pancreas appears normal. Please note that lipase levels may be more sensitive than CT for detecting early acute pancreatitis. Periosteal reaction along the left lateral seventh rib could indicate a healing rib fracture".  Blood sugar in 400s, Bhob 1.86, initially around 1 PM bicarb 16 anion gap 15,  given 1 liter LR x 2, repeat BMP around 6 pm- showed increased to 17, bicarb 15 but aroudn 8 pm labs with bicarb at 13, blood glucose 466, anion gap 19. Marland Kitchen She was placed on insulin drip and admission was requested for further management. Currently asking for stronger pain medication as  toradol only provided some relief.   ED Course:  Vitals:   09/04/22 1653 09/04/22 2003 09/04/22 2004 09/04/22 2108  BP: (!) 142/83 139/84    Pulse: 76 84 84   Resp: 14 18    Temp: 98.1 F (36.7 C)   98.8 F (37.1 C)  TempSrc: Oral   Oral  SpO2: 100% 100% 100%   Weight:      Height:         Assessment/Plan Principal Problem:   DKA (diabetic ketoacidosis) (HCC)  DKA Type 1 diabetes mellitus on insulin pump with uncontrolled hyperglycemia: Patient appears to be in DKA in the setting of nonfunctional pump since pump since last night.  Otherwise blood sugar fairly controlled on insulin pump at home.  Continue aggressive IV fluid hydration, insulin drip as per DKA protocol with every 4 hours BMP and beta-hydroxybutyrate acid check.  Once anion gap closes> will transition to subcu basal and bolus insulin regimen.  DM continue consulted.  Patient reports she has her prescription already sent for insulin supplies and will be able to use insulin pump at home- currently not in use. update hba1c. Recent Labs  Lab 09/04/22 1242 09/04/22 1650 09/04/22 1924  GLUCAP 310* 430* 519*    Nausea vomiting abdominal discomfort concern for possible colitis-based on CT abdomen findings but could be in the setting of patient's DKA.  Will treat symptomatically with IV fluids-check stool for C. difficile and GI pathogen panel, continue PRN antiemetics. Hold off on antibiotics since afebrile and no leukocytosis.  Cont prn morphine for pain  Mildly abnormal bilirubin otherwise stable LFTs lipase-trend.  Drinks etoh socailly 2x weekly Smoker Marijuana daily: Cessation advise before d/c.   Body mass index is 28.32 kg/m.   Severity of Illness: The appropriate patient status for this patient is OBSERVATION. Observation status is judged to be reasonable and necessary in order to provide the required intensity of service to ensure the patient's safety. The patient's presenting symptoms, physical exam  findings, and initial radiographic and laboratory data in the context of their medical condition is felt to place them at decreased risk for further clinical deterioration. Furthermore, it is anticipated that the patient will be medically stable for discharge from the hospital within 2 midnights of admission.    DVT prophylaxis: enoxaparin (LOVENOX) injection 40 mg Start: 09/04/22 2200 Code Status:   Code Status: Full Code  Family Communication: Admission, patients condition and plan of care including tests being ordered have been discussed with the patient who indicate understanding and agree with the plan and Code Status.  Consults called:  none  Review of Systems: All systems were reviewed and were negative except as mentioned in HPI above. Negative for fever Negative for chest pain Negative for vomiting  Past Medical History:  Diagnosis Date   Allergic rhinitis 07/07/2021   COVID 2020   mild   Diabetes mellitus (HCC)    Type 1, type 5   Dysmenorrhea 02/20/2019   GERD (gastroesophageal reflux disease)    Headache    History of herpes genitalis 06/24/2021   Intractable vomiting    SIRS (systemic inflammatory response syndrome) (HCC) 12/03/2019    Past Surgical History:  Procedure Laterality Date   DILATION AND EVACUATION N/A 07/05/2022   Procedure: DILATATION AND EVACUATION;  Surgeon: Barre Bing, MD;  Location: [facility] OR;  Service: Gynecology;  Laterality: N/A;   OPERATIVE ULTRASOUND N/A 07/05/2022   Procedure: OPERATIVE ULTRASOUND;  Surgeon: Bena Bing, MD;  Location: [facility] OR;  Service: Gynecology;  Laterality: N/A;     reports that she has been smoking cigarettes. She has never used smokeless tobacco. She reports current alcohol use. She reports current drug use. Drug: Marijuana.  No Known Allergies  Family History  Problem Relation Age of Onset   Diabetes Paternal Grandfather    Heart attack Paternal Grandfather    Diabetes Paternal Grandmother    Cancer  Maternal Grandmother        lung   Alcohol abuse Maternal Grandfather    Alcohol abuse Father    Drug abuse Father    High Cholesterol Father    Alcohol abuse Mother    Depression Mother    Drug abuse Mother      Prior to Admission medications   Medication Sig Start Date End Date Taking? Authorizing Provider  insulin aspart (NOVOLOG) 100 UNIT/ML injection Max Daily 60 units per pump 06/08/22  Yes Shamleffer, Konrad Dolores, MD  norethindrone-ethinyl estradiol-FE (LOESTRIN FE) 1-20 MG-MCG tablet Take 1 tablet by mouth daily. 07/20/22  Yes Jerene Bears, MD  ACCU-CHEK GUIDE test strip Use to check your blood sugar 4 times a day (before meals and at bedtime) Patient not taking: Reported on 09/04/2022 06/02/21   Elenore Paddy, NP  blood glucose meter kit and supplies Dispense based on patient and insurance preference. Use up to four times daily as directed. (FOR ICD-10 E10.9, E11.9). Patient not taking: Reported on 09/04/2022 01/25/21   Jacalyn Lefevre, MD  capsicum (ZOSTRIX) 0.075 % topical cream Apply 1 application. topically 2 (  two) times daily. Patient not taking: Reported on 09/04/2022 08/07/21   Theadora Rama Scales, PA-C  Continuous Blood Gluc Sensor (DEXCOM G6 SENSOR) MISC USE AS DIRECTED. Patient not taking: Reported on 09/04/2022 06/18/22   Shamleffer, Konrad Dolores, MD  Continuous Blood Gluc Transmit (DEXCOM G6 TRANSMITTER) MISC 1 Device by Does not apply route as directed. Patient not taking: Reported on 09/04/2022 07/26/21   Shamleffer, Konrad Dolores, MD  dicyclomine (BENTYL) 20 MG tablet Take 1 tablet (20 mg total) by mouth 2 (two) times daily. Patient not taking: Reported on 07/04/2022 06/15/22   Elayne Snare K, DO  fluticasone [facility]) 50 MCG/ACT nasal spray Place 2 sprays into both nostrils daily. Patient not taking: Reported on 07/04/2022 07/07/21   Elenore Paddy, NP  ibuprofen (MOTRIN IB) 200 MG tablet Take 3 tablets (600 mg total) by mouth every 6 (six) hours as  needed. Patient not taking: Reported on 09/04/2022 07/05/22   Fieldale Bing, MD  norgestimate-ethinyl estradiol (ORTHO-CYCLEN) 0.25-35 MG-MCG tablet Take 1 tablet by mouth daily. Patient not taking: Reported on 09/04/2022 06/24/21   Marny Lowenstein, PA-C  ondansetron (ZOFRAN-ODT) 8 MG disintegrating tablet Take 1 tablet (8 mg total) by mouth every 8 (eight) hours as needed for nausea or vomiting. Patient not taking: Reported on 09/04/2022 06/17/22   Theron Arista, PA-C  pantoprazole (PROTONIX) 40 MG tablet Take 1 tablet (40 mg total) by mouth daily. Patient not taking: Reported on 09/04/2022 12/07/19 01/06/20  Lanae Boast, MD    Physical Exam: Vitals:   09/04/22 1653 09/04/22 2003 09/04/22 2004 09/04/22 2108  BP: (!) 142/83 139/84    Pulse: 76 84 84   Resp: 14 18    Temp: 98.1 F (36.7 C)   98.8 F (37.1 C)  TempSrc: Oral   Oral  SpO2: 100% 100% 100%   Weight:      Height:        General exam: AAOx3 , NAD, weak appearing. HEENT:Oral mucosa moist, Ear/Nose WNL grossly, dentition normal. Respiratory system: bilaterally clear,no wheezing or crackles,no use of accessory muscle Cardiovascular system: S1 & S2 +, No JVD,. Gastrointestinal system: Abdomen soft, mildly tender on left abdomen, ND, BS+ Nervous System:Alert, awake, moving extremities and grossly nonfocal Extremities: No edema, distal peripheral pulses palpable.  Skin: No rashes,no icterus. MSK: Normal muscle bulk,tone, power   Labs on Admission: I have personally reviewed following labs and imaging studies CBC: Recent Labs  Lab 09/04/22 1318  WBC 8.0  NEUTROABS 6.5  HGB 12.7  HCT 39.1  MCV 85.9  PLT 428*   Basic Metabolic Panel: Recent Labs  Lab 09/04/22 1318 09/04/22 1536 09/04/22 1823 09/04/22 1958  NA 132* 130* 131* 132*  K 3.8 4.2 3.7 4.0  CL 101 101 99 100  CO2 16* 17* 15* 13*  GLUCOSE 365* 434* 461* 466*  BUN 6 7 9 10   CREATININE 0.91 0.83 0.91 0.95  CALCIUM 8.7* 8.2* 8.9 9.0  MG  --   --   --  1.7   PHOS  --   --   --  4.0   GFR: Estimated Creatinine Clearance: 77.9 mL/min (by C-G formula based on SCr of 0.95 mg/dL). Liver Function Tests: Recent Labs  Lab 09/04/22 1620 09/04/22 1823  AST 36 33  ALT 22 26  ALKPHOS 70 80  BILITOT 1.3* 1.1  PROT 7.1 7.6  ALBUMIN 3.6 4.0   Recent Labs  Lab 09/04/22 1620  LIPASE 22  CBG: Recent Labs  Lab 09/04/22 1242 09/04/22 1650  09/04/22 1924  GLUCAP 310* 430* 519*      Component Value Date/Time   COLORURINE COLORLESS (A) 09/04/2022 1248   APPEARANCEUR CLEAR 09/04/2022 1248   LABSPEC 1.015 09/04/2022 1248   PHURINE 7.0 09/04/2022 1248   GLUCOSEU >=500 (A) 09/04/2022 1248   HGBUR NEGATIVE 09/04/2022 1248   BILIRUBINUR NEGATIVE 09/04/2022 1248   BILIRUBINUR Negative 04/12/2022 1110   KETONESUR 80 (A) 09/04/2022 1248   PROTEINUR NEGATIVE 09/04/2022 1248   UROBILINOGEN 0.2 04/12/2022 1110   UROBILINOGEN 1.0 02/01/2022 1020   NITRITE NEGATIVE 09/04/2022 1248   LEUKOCYTESUR NEGATIVE 09/04/2022 1248    Radiological Exams on Admission: CT ABDOMEN PELVIS W CONTRAST  Result Date: 09/04/2022 CLINICAL DATA:  Nausea vomiting and abdominal pain. Suspected pancreatitis. EXAM: CT ABDOMEN AND PELVIS WITH CONTRAST TECHNIQUE: Multidetector CT imaging of the abdomen and pelvis was performed using the standard protocol following bolus administration of intravenous contrast. RADIATION DOSE REDUCTION: This exam was performed according to the departmental dose-optimization program which includes automated exposure control, adjustment of the mA and/or kV according to patient size and/or use of iterative reconstruction technique. CONTRAST:  OMNIPAQUE IOHEXOL 300 MG/ML  SOLN COMPARISON:  12/06/2019 FINDINGS: Lower chest: Unremarkable Hepatobiliary: Imaging is performed prior to hepatic vein opacification. Gallbladder unremarkable. No biliary dilatation observed. No focal hepatic lesion identified. Pancreas: Unremarkable. No findings of pancreatic  pseudocyst, abscess, or necrosis. No definite abnormal peripancreatic inflammatory stranding. Please note that lipase levels may be more sensitive than CT for detecting early acute pancreatitis. Spleen: Unremarkable Adrenals/Urinary Tract: Unremarkable Stomach/Bowel: Diffuse well wall thickening of the otherwise empty colon favoring colitis. No pneumatosis or portal venous gas. No dilated small bowel. Tubular right lower quadrant structure favoring appendix adjacent to the cecum does not appear inflamed. Vascular/Lymphatic: Unremarkable Reproductive: Unremarkable Other: No supplemental non-categorized findings. Musculoskeletal: On images 2-3 of series 5 there is periosteal reaction along the left lateral seventh rib which could indicate a healing rib fracture. IMPRESSION: 1. Diffuse well wall thickening of the otherwise empty colon favoring colitis. No pneumatosis or portal venous gas. 2. The pancreas appears normal. Please note that lipase levels may be more sensitive than CT for detecting early acute pancreatitis. 3. Periosteal reaction along the left lateral seventh rib could indicate a healing rib fracture. Electronically Signed   By: Gaylyn Rong M.D.   On: 09/04/2022 16:47     Lanae Boast MD [facility]  If 7PM-7AM, please contact night-coverage [email or web address]  09/04/2022, 9:16 PM

## 2022-09-04 NOTE — ED Notes (Signed)
Pt provides urine sample and it sent to the lab at this time.

## 2022-09-05 ENCOUNTER — Other Ambulatory Visit: Payer: Self-pay

## 2022-09-05 ENCOUNTER — Encounter (HOSPITAL_COMMUNITY): Payer: Self-pay | Admitting: Internal Medicine

## 2022-09-05 DIAGNOSIS — Z813 Family history of other psychoactive substance abuse and dependence: Secondary | ICD-10-CM | POA: Diagnosis not present

## 2022-09-05 DIAGNOSIS — K219 Gastro-esophageal reflux disease without esophagitis: Secondary | ICD-10-CM | POA: Diagnosis present

## 2022-09-05 DIAGNOSIS — R17 Unspecified jaundice: Secondary | ICD-10-CM | POA: Diagnosis present

## 2022-09-05 DIAGNOSIS — Z833 Family history of diabetes mellitus: Secondary | ICD-10-CM | POA: Diagnosis not present

## 2022-09-05 DIAGNOSIS — Z8249 Family history of ischemic heart disease and other diseases of the circulatory system: Secondary | ICD-10-CM | POA: Diagnosis not present

## 2022-09-05 DIAGNOSIS — Z83438 Family history of other disorder of lipoprotein metabolism and other lipidemia: Secondary | ICD-10-CM | POA: Diagnosis not present

## 2022-09-05 DIAGNOSIS — D75838 Other thrombocytosis: Secondary | ICD-10-CM | POA: Diagnosis present

## 2022-09-05 DIAGNOSIS — Z8616 Personal history of COVID-19: Secondary | ICD-10-CM | POA: Diagnosis not present

## 2022-09-05 DIAGNOSIS — Z818 Family history of other mental and behavioral disorders: Secondary | ICD-10-CM | POA: Diagnosis not present

## 2022-09-05 DIAGNOSIS — E860 Dehydration: Secondary | ICD-10-CM | POA: Diagnosis present

## 2022-09-05 DIAGNOSIS — E873 Alkalosis: Secondary | ICD-10-CM | POA: Diagnosis present

## 2022-09-05 DIAGNOSIS — E1165 Type 2 diabetes mellitus with hyperglycemia: Secondary | ICD-10-CM | POA: Diagnosis not present

## 2022-09-05 DIAGNOSIS — E1010 Type 1 diabetes mellitus with ketoacidosis without coma: Secondary | ICD-10-CM | POA: Diagnosis not present

## 2022-09-05 DIAGNOSIS — E876 Hypokalemia: Secondary | ICD-10-CM | POA: Diagnosis present

## 2022-09-05 DIAGNOSIS — Z811 Family history of alcohol abuse and dependence: Secondary | ICD-10-CM | POA: Diagnosis not present

## 2022-09-05 DIAGNOSIS — E871 Hypo-osmolality and hyponatremia: Secondary | ICD-10-CM | POA: Diagnosis present

## 2022-09-05 DIAGNOSIS — Z79899 Other long term (current) drug therapy: Secondary | ICD-10-CM | POA: Diagnosis not present

## 2022-09-05 DIAGNOSIS — Z9641 Presence of insulin pump (external) (internal): Secondary | ICD-10-CM | POA: Diagnosis present

## 2022-09-05 DIAGNOSIS — Z1152 Encounter for screening for COVID-19: Secondary | ICD-10-CM | POA: Diagnosis not present

## 2022-09-05 DIAGNOSIS — F17210 Nicotine dependence, cigarettes, uncomplicated: Secondary | ICD-10-CM | POA: Diagnosis present

## 2022-09-05 LAB — BASIC METABOLIC PANEL
Anion gap: 11 (ref 5–15)
Anion gap: 8 (ref 5–15)
BUN: 7 mg/dL (ref 6–20)
BUN: 6 mg/dL (ref 6–20)
CO2: 20 mmol/L — ABNORMAL LOW (ref 22–32)
CO2: 20 mmol/L — ABNORMAL LOW (ref 22–32)
Calcium: 9.1 mg/dL (ref 8.9–10.3)
Calcium: 8.8 mg/dL — ABNORMAL LOW (ref 8.9–10.3)
Chloride: 104 mmol/L (ref 98–111)
Chloride: 106 mmol/L (ref 98–111)
Creatinine, Ser: 0.8 mg/dL (ref 0.44–1.00)
Creatinine, Ser: 0.68 mg/dL (ref 0.44–1.00)
GFR, Estimated: >60 mL/min (ref >60)
GFR, Estimated: >60 mL/min (ref >60)
Glucose, Bld: 165 mg/dL — ABNORMAL HIGH (ref 70–99)
Glucose, Bld: 113 mg/dL — ABNORMAL HIGH (ref 70–99)
Potassium: 3.9 mmol/L (ref 3.5–5.1)
Potassium: 3.4 mmol/L — ABNORMAL LOW (ref 3.5–5.1)
Sodium: 135 mmol/L (ref 135–145)
Sodium: 134 mmol/L — ABNORMAL LOW (ref 135–145)

## 2022-09-05 LAB — GLUCOSE, CAPILLARY
Glucose-Capillary: 100 mg/dL — ABNORMAL HIGH (ref 70–99)
Glucose-Capillary: 105 mg/dL — ABNORMAL HIGH (ref 70–99)
Glucose-Capillary: 110 mg/dL — ABNORMAL HIGH (ref 70–99)
Glucose-Capillary: 112 mg/dL — ABNORMAL HIGH (ref 70–99)
Glucose-Capillary: 123 mg/dL — ABNORMAL HIGH (ref 70–99)
Glucose-Capillary: 123 mg/dL — ABNORMAL HIGH (ref 70–99)
Glucose-Capillary: 127 mg/dL — ABNORMAL HIGH (ref 70–99)
Glucose-Capillary: 131 mg/dL — ABNORMAL HIGH (ref 70–99)
Glucose-Capillary: 136 mg/dL — ABNORMAL HIGH (ref 70–99)
Glucose-Capillary: 137 mg/dL — ABNORMAL HIGH (ref 70–99)
Glucose-Capillary: 148 mg/dL — ABNORMAL HIGH (ref 70–99)
Glucose-Capillary: 153 mg/dL — ABNORMAL HIGH (ref 70–99)
Glucose-Capillary: 157 mg/dL — ABNORMAL HIGH (ref 70–99)
Glucose-Capillary: 159 mg/dL — ABNORMAL HIGH (ref 70–99)
Glucose-Capillary: 167 mg/dL — ABNORMAL HIGH (ref 70–99)
Glucose-Capillary: 180 mg/dL — ABNORMAL HIGH (ref 70–99)
Glucose-Capillary: 184 mg/dL — ABNORMAL HIGH (ref 70–99)
Glucose-Capillary: 187 mg/dL — ABNORMAL HIGH (ref 70–99)
Glucose-Capillary: 202 mg/dL — ABNORMAL HIGH (ref 70–99)
Glucose-Capillary: 230 mg/dL — ABNORMAL HIGH (ref 70–99)

## 2022-09-05 LAB — BETA-HYDROXYBUTYRIC ACID: Beta-Hydroxybutyric Acid: 0.86 mmol/L — ABNORMAL HIGH (ref 0.05–0.27)

## 2022-09-05 LAB — HEMOGLOBIN A1C
Hgb A1c MFr Bld: 7.6 % — ABNORMAL HIGH (ref 4.8–5.6)
Mean Plasma Glucose: 171.42 mg/dL

## 2022-09-05 LAB — MAGNESIUM: Magnesium: 1.5 mg/dL — ABNORMAL LOW (ref 1.7–2.4)

## 2022-09-05 LAB — PHOSPHORUS: Phosphorus: 2.4 mg/dL — ABNORMAL LOW (ref 2.5–4.6)

## 2022-09-05 LAB — HIV ANTIBODY (ROUTINE TESTING W REFLEX): HIV Screen 4th Generation wRfx: NONREACTIVE

## 2022-09-05 MED ORDER — PIPERACILLIN-TAZOBACTAM 3.375 G IVPB 30 MIN: 3.3750 g | Freq: Four times a day (QID) | INTRAVENOUS | Status: DC

## 2022-09-05 MED ORDER — MAGNESIUM SULFATE 4 GM/100ML IV SOLN
4.0000 g | Freq: Once | INTRAVENOUS | Status: AC
  Administered 2022-09-05: 4 g via INTRAVENOUS
  Filled 2022-09-05: qty 100

## 2022-09-05 MED ORDER — MORPHINE SULFATE (PF) 2 MG/ML IV SOLN
2.0000 mg | INTRAVENOUS | Status: DC | PRN
  Administered 2022-09-05 (×3): 2 mg via INTRAVENOUS
  Filled 2022-09-05 (×3): qty 1

## 2022-09-05 MED ORDER — ORAL CARE MOUTH RINSE: 15.0000 mL | OROMUCOSAL | Status: DC | PRN

## 2022-09-05 MED ORDER — HYDROMORPHONE HCL 1 MG/ML IJ SOLN
1.0000 mg | INTRAMUSCULAR | Status: DC | PRN
  Administered 2022-09-05 – 2022-09-07 (×4): 1 mg via INTRAVENOUS
  Filled 2022-09-05 (×6): qty 1

## 2022-09-05 MED ORDER — MAGNESIUM SULFATE 2 GM/50ML IV SOLN
2.0000 g | Freq: Once | INTRAVENOUS | Status: AC
  Administered 2022-09-05: 2 g via INTRAVENOUS
  Filled 2022-09-05: qty 50

## 2022-09-05 MED ORDER — PIPERACILLIN-TAZOBACTAM 3.375 G IVPB
3.3750 g | Freq: Three times a day (TID) | INTRAVENOUS | Status: DC
  Administered 2022-09-05 – 2022-09-07 (×6): 3.375 g via INTRAVENOUS
  Filled 2022-09-05 (×6): qty 50

## 2022-09-05 NOTE — Progress Notes (Signed)
Progress Note   Patient: Judy Cook [id number] DOB: May 01, 2000 DOA: 09/04/2022     0 DOS: the patient was seen and examined on 09/05/2022   Brief hospital course: 22 year old female with  T1DM on insulin pump presents to the ED with nausea vomiting abdominal discomfort.  She reported that her insulin pump is not functioning as she ran out of the supplies since 5/12 lat night. She has abdominal discomfort in the epigastric area, was worried she might be pregnant, LMP last week. She states her last DKA was > 1 year ago and blood sugar runs in 100-180 on her insulin pump. She was having dizziness and felt dehydrated . She has had loose stool about 10 today, without blood, is liquidly.  CT Abd/Plvis: "Diffuse well wall thickening of the otherwise empty colon favoring colitis.  CMP shows blood sugar of 466 with high anion gap and acidosis. Patient was diagnosed of DKA initiated on insulin drip, no DKA has resolved.  However patient continues to complain of significant abdominal pain unable to tolerate diet.  Hence plan is to continue the patient on insulin drip and dextrose containing fluids.  Patient has been initiated on IV antibiotic.  Assessment and Plan:  DKA Type 1 diabetes mellitus on insulin pump with uncontrolled hyperglycemia: Patient presented in DKA in the setting of nonfunctional pump since pump since last night.   -Patient was placed on insulin drip, currently DKA resolved - However still continue insulin drip with dextrose containing fluids given patient has second-story abdominal pain and able to tolerate diet.    Nausea vomiting abdominal discomfort concern for possible colitis-based on CT abdomen findings but could be in the setting of patient's DKA.  -However despite the resolution of DKA patient still continues to have considerable abdominal pain some nausea but no vomiting - Continue dextrose containing fluids - Started the patient on IV antibiotics  Zosyn. -Continue treatment symptomatically with IV fluids-check stool for C. difficile and GI pathogen panel, continue PRN antiemetics.   Mildly abnormal bilirubin otherwise stable LFTs lipase-trend.   Drinks etoh socailly 2x weekly Smoker Marijuana daily: Cessation advise before d/c.     Body mass index is 28.32 kg/m.    Severity of Illness: The appropriate patient status for this patient is OBSERVATION. Observation status is judged to be reasonable and necessary in order to provide the required intensity of service to ensure the patient's safety. The patient's presenting symptoms, physical exam findings, and initial radiographic and laboratory data in the context of their medical condition is felt to place them at decreased risk for further clinical deterioration. Furthermore, it is anticipated that the patient will be medically stable for discharge from the hospital within 2 midnights of admission.     DVT prophylaxis: enoxaparin (LOVENOX) injection 40 mg Start: 09/04/22 2200 Code Status:   Code Status: Full Code       Subjective: Patient seen and examined at bedside today.  Denies having any active nausea vomiting but continues to complain of abdominal pain.  Physical Exam: Vitals:   09/05/22 1000 09/05/22 1100 09/05/22 1120 09/05/22 1200  BP: (!) 158/98   124/75  Pulse: (!) 102 85  79  Resp: (!) 26 20  18   Temp:   98.5 F (36.9 C)   TempSrc:   Oral   SpO2: 100% 100%  100%  Weight:      Height:        General exam: AAOx3 , NAD, weak appearing. HEENT:Oral mucosa moist, Ear/Nose WNL grossly, dentition  normal. Respiratory system: bilaterally clear,no wheezing or crackles,no use of accessory muscle Cardiovascular system: S1 & S2 +, No JVD,. Gastrointestinal system: Abdomen soft, mildly tender on left abdomen, ND, BS+ Nervous System:Alert, awake, moving extremities and grossly nonfocal Extremities: No edema, distal peripheral pulses palpable.  Skin: No rashes,no  icterus. MSK: Normal muscle bulk,tone, power  Data Reviewed:  CMP shows mild hyponatremia, hypokalemia sodium bicarbonate is 20 and anion gap is 8.  Family Communication: Patient is alert, no family member at bedside  Disposition: Status is: Observation The patient will require care spanning > 2 midnights and should be moved to inpatient because: Persistent abdominal pain  Planned Discharge Destination: Home    Time spent: 35 minutes  Author: Harold Hedge, MD 09/05/2022 2:20 PM  For on call review [email or web address].

## 2022-09-05 NOTE — Inpatient Diabetes Management (Signed)
Inpatient Diabetes Program Recommendations  AACE/ADA: New Consensus Statement on Inpatient Glycemic Control (2015)  Target Ranges:  Prepandial:   less than 140 mg/dL      Peak postprandial:   less than 180 mg/dL (1-2 hours)      Critically ill patients:  140 - 180 mg/dL   Lab Results  Component Value Date   GLUCAP 136 (H) 09/05/2022   HGBA1C 7.6 (H) 09/05/2022    Review of Glycemic Control  Diabetes history: DM1 Outpatient Diabetes medications: Insulin pump - uses Novolog Current orders for Inpatient glycemic control: IV insulin per [facility] for DKA  Checks blood sugars multiple times with CGM. (Dexcom)  Insulin pump: 0.7 units/H   - 16.8 Total units CF 45 with goal of 120 I:C - 1:10  Inpatient Diabetes Program Recommendations:    When transitioning to SQ insulin, give Semglee 1-2 hours prior to discontinuation of drip  Semglee 15 units Q24H Novolog 0-9 units TID with meals and 0-5 HS Novolog 4 units TID with meals if eating > 50%  Discussed her glucose control and HgbA1C. Sees Endo on regular basis. All questions answered.   Thank you. Ailene Ards, RD, LDN, CDCES Inpatient Diabetes Coordinator [phone]

## 2022-09-06 DIAGNOSIS — E1165 Type 2 diabetes mellitus with hyperglycemia: Secondary | ICD-10-CM

## 2022-09-06 LAB — BASIC METABOLIC PANEL
Anion gap: 7 (ref 5–15)
BUN: <5 mg/dL — ABNORMAL LOW (ref 6–20)
CO2: 25 mmol/L (ref 22–32)
Calcium: 8.2 mg/dL — ABNORMAL LOW (ref 8.9–10.3)
Chloride: 105 mmol/L (ref 98–111)
Creatinine, Ser: 0.76 mg/dL (ref 0.44–1.00)
GFR, Estimated: >60 mL/min (ref >60)
Glucose, Bld: 136 mg/dL — ABNORMAL HIGH (ref 70–99)
Potassium: 3 mmol/L — ABNORMAL LOW (ref 3.5–5.1)
Sodium: 137 mmol/L (ref 135–145)

## 2022-09-06 LAB — GLUCOSE, CAPILLARY
Glucose-Capillary: 107 mg/dL — ABNORMAL HIGH (ref 70–99)
Glucose-Capillary: 112 mg/dL — ABNORMAL HIGH (ref 70–99)
Glucose-Capillary: 115 mg/dL — ABNORMAL HIGH (ref 70–99)
Glucose-Capillary: 118 mg/dL — ABNORMAL HIGH (ref 70–99)
Glucose-Capillary: 122 mg/dL — ABNORMAL HIGH (ref 70–99)
Glucose-Capillary: 131 mg/dL — ABNORMAL HIGH (ref 70–99)
Glucose-Capillary: 135 mg/dL — ABNORMAL HIGH (ref 70–99)
Glucose-Capillary: 146 mg/dL — ABNORMAL HIGH (ref 70–99)
Glucose-Capillary: 153 mg/dL — ABNORMAL HIGH (ref 70–99)
Glucose-Capillary: 158 mg/dL — ABNORMAL HIGH (ref 70–99)
Glucose-Capillary: 210 mg/dL — ABNORMAL HIGH (ref 70–99)
Glucose-Capillary: 235 mg/dL — ABNORMAL HIGH (ref 70–99)
Glucose-Capillary: 376 mg/dL — ABNORMAL HIGH (ref 70–99)
Glucose-Capillary: 92 mg/dL (ref 70–99)

## 2022-09-06 LAB — MAGNESIUM: Magnesium: 1.8 mg/dL (ref 1.7–2.4)

## 2022-09-06 MED ORDER — INSULIN GLARGINE-YFGN 100 UNIT/ML ~~LOC~~ SOLN
15.0000 [IU] | Freq: Every day | SUBCUTANEOUS | Status: DC
  Administered 2022-09-06: 15 [IU] via SUBCUTANEOUS
  Filled 2022-09-06 (×2): qty 0.15

## 2022-09-06 MED ORDER — ACETAMINOPHEN 325 MG PO TABS: 650.0000 mg | ORAL_TABLET | Freq: Four times a day (QID) | ORAL | Status: DC | PRN

## 2022-09-06 MED ORDER — INSULIN ASPART 100 UNIT/ML IJ SOLN
0.0000 [IU] | Freq: Every day | INTRAMUSCULAR | Status: DC
  Administered 2022-09-06: 5 [IU] via SUBCUTANEOUS

## 2022-09-06 MED ORDER — PROMETHAZINE HCL 25 MG PO TABS
12.5000 mg | ORAL_TABLET | Freq: Four times a day (QID) | ORAL | Status: DC | PRN
  Filled 2022-09-06: qty 1

## 2022-09-06 MED ORDER — INSULIN ASPART 100 UNIT/ML IJ SOLN
0.0000 [IU] | Freq: Three times a day (TID) | INTRAMUSCULAR | Status: DC
  Administered 2022-09-06: 3 [IU] via SUBCUTANEOUS
  Administered 2022-09-06: 1 [IU] via SUBCUTANEOUS

## 2022-09-06 MED ORDER — SODIUM CHLORIDE 0.9 % IV BOLUS
500.0000 mL | Freq: Once | INTRAVENOUS | Status: AC
  Administered 2022-09-06: 500 mL via INTRAVENOUS

## 2022-09-06 MED ORDER — POTASSIUM CHLORIDE CRYS ER 20 MEQ PO TBCR
40.0000 meq | EXTENDED_RELEASE_TABLET | ORAL | Status: AC
  Administered 2022-09-06 (×2): 40 meq via ORAL
  Filled 2022-09-06 (×2): qty 2

## 2022-09-06 MED ORDER — MAGNESIUM SULFATE 2 GM/50ML IV SOLN
2.0000 g | Freq: Once | INTRAVENOUS | Status: AC
  Administered 2022-09-06: 2 g via INTRAVENOUS
  Filled 2022-09-06: qty 50

## 2022-09-06 MED ORDER — SODIUM CHLORIDE 0.9 % IV SOLN: INTRAVENOUS | Status: DC

## 2022-09-06 MED ORDER — INSULIN ASPART 100 UNIT/ML IJ SOLN
4.0000 [IU] | Freq: Three times a day (TID) | INTRAMUSCULAR | Status: DC
  Administered 2022-09-06 (×2): 4 [IU] via SUBCUTANEOUS

## 2022-09-06 NOTE — Progress Notes (Signed)
PROGRESS NOTE    Judy Cook  WUJ:[id number] DOB: November 02, 2000 DOA: 09/04/2022 PCP: Elenore Paddy, NP   Brief Narrative: 22 year old with past medical history significant for diabetes type 1 on insulin pump presented to the ED with nausea, vomiting and abdominal discomfort.  She reported that her insulin pump was not functioning as she also brought of her supplies since 5/12.  She presented with abdominal discomfort, epigastric area.  She reported dizziness.  CT abdomen and pelvis showed diffuse wall thickening, favoring colitis.  Patient was admitted for  DKA.  She was initiated on insulin drip, DKA has resolved.  She continued to have abdominal pain and unable to tolerate diet, she has remained on insulin drip and D 5.    Assessment & Plan:   Principal Problem:   DKA (diabetic ketoacidosis) (HCC)   1-DKA Type 1 diabetes on insulin pump with uncontrolled hyperglycemia: Admitted for DKA.  She remain on Insulin gtt and D 5 because of unable to tolerates oral.  She is feeling better, no significant abdominal pain, plan to transition to [facility] and meals coverage and SSI.   Nausea, vomiting abdominal discomfort: ? Colitis on CT scan.  Denies diarrhea.  Started on IV antibiotics. Continue with IV antibiotics.  Pain could had be related to DKA as well.  Plan to start diet.   Hyperbilirubinemia: Mild . Resolved. Suspect related to dehydration.   Hypokalemia; Replete orally.   Hypomagnesemia; replete IV>   Pseudohyponatremia; in setting of hyperglycemia. Correction sodium 138     Estimated body mass index is 27.34 kg/m as calculated from the following:   Height as of this encounter: 5' (1.524 m).   Weight as of this encounter: 63.5 kg.   DVT prophylaxis: Heparin Code Status: full code Family Communication: Care discussed with patient.  Disposition Plan:  Status is: Inpatient Remains inpatient appropriate because: management of DKA, plan to discharge home  tomorrow if she is able to tolerates diet.     Consultants:  None  Procedures:  None  Antimicrobials:    Subjective: She is feeling better, abdominal pain improved, denies nausea. Would like to start eating.   Objective: Vitals:   09/06/22 0500 09/06/22 0600 09/06/22 0700 09/06/22 0723  BP:      Pulse:      Resp: (!) 23 16 16    Temp:    98.3 F (36.8 C)  TempSrc:    Oral  SpO2:      Weight:      Height:        Intake/Output Summary (Last 24 hours) at 09/06/2022 0738 Last data filed at 09/06/2022 0727 Gross per 24 hour  Intake 3400.76 ml  Output 1600 ml  Net 1800.76 ml   Filed Weights   09/04/22 1244 09/04/22 2233  Weight: 65.8 kg 63.5 kg    Examination:  General exam: Appears calm and comfortable  Respiratory system: Clear to auscultation. Respiratory effort normal. Cardiovascular system: S1 & S2 heard, RRR. No JVD, murmurs, rubs, gallops or clicks. No pedal edema. Gastrointestinal system: Abdomen is nondistended, soft and nontender. No organomegaly or masses felt. Normal bowel sounds heard. Central nervous system: Alert and oriented.  Extremities: Symmetric 5 x 5 power.   Data Reviewed: I have personally reviewed following labs and imaging studies  CBC: Recent Labs  Lab 09/04/22 1318  WBC 8.0  NEUTROABS 6.5  HGB 12.7  HCT 39.1  MCV 85.9  PLT 428*   Basic Metabolic Panel: Recent Labs  Lab 09/04/22 1536 09/04/22 1823  09/04/22 1958 09/05/22 0323 09/05/22 0802  NA 130* 131* 132* 135 134*  K 4.2 3.7 4.0 3.9 3.4*  CL 101 99 100 104 106  CO2 17* 15* 13* 20* 20*  GLUCOSE 434* 461* 466* 165* 113*  BUN 7 9 10 7 6   CREATININE 0.83 0.91 0.95 0.80 0.68  CALCIUM 8.2* 8.9 9.0 9.1 8.8*  MG  --   --  1.7 1.5*  --   PHOS  --   --  4.0 2.4*  --    GFR: Estimated Creatinine Clearance: 91 mL/min (by C-G formula based on SCr of 0.68 mg/dL). Liver Function Tests: Recent Labs  Lab 09/04/22 1620 09/04/22 1823  AST 36 33  ALT 22 26  ALKPHOS 70 80   BILITOT 1.3* 1.1  PROT 7.1 7.6  ALBUMIN 3.6 4.0   Recent Labs  Lab 09/04/22 1620  LIPASE 22   No results for input(s): "AMMONIA" in the last 168 hours. Coagulation Profile: No results for input(s): "INR", "PROTIME" in the last 168 hours. Cardiac Enzymes: No results for input(s): "CKTOTAL", "CKMB", "CKMBINDEX", "TROPONINI" in the last 168 hours. BNP (last 3 results) No results for input(s): "PROBNP" in the last 8760 hours. HbA1C: Recent Labs    09/05/22 0323  HGBA1C 7.6*   CBG: Recent Labs  Lab 09/06/22 0239 09/06/22 0402 09/06/22 0510 09/06/22 0610 09/06/22 0715  GLUCAP 158* 131* 107* 122* 153*   Lipid Profile: No results for input(s): "CHOL", "HDL", "LDLCALC", "TRIG", "CHOLHDL", "LDLDIRECT" in the last 72 hours. Thyroid Function Tests: No results for input(s): "TSH", "T4TOTAL", "FREET4", "T3FREE", "THYROIDAB" in the last 72 hours. Anemia Panel: No results for input(s): "VITAMINB12", "FOLATE", "FERRITIN", "TIBC", "IRON", "RETICCTPCT" in the last 72 hours. Sepsis Labs: No results for input(s): "PROCALCITON", "LATICACIDVEN" in the last 168 hours.  Recent Results (from the past 240 hour(s))  Resp panel by RT-PCR (RSV, Flu A&B, Covid) Anterior Nasal Swab     Status: None   Collection Time: 09/04/22  1:25 PM   Specimen: Anterior Nasal Swab  Result Value Ref Range Status   SARS Coronavirus 2 by RT PCR NEGATIVE NEGATIVE Final    Comment: (NOTE) SARS-CoV-2 target nucleic acids are NOT DETECTED.  The SARS-CoV-2 RNA is generally detectable in upper respiratory specimens during the acute phase of infection. The lowest concentration of SARS-CoV-2 viral copies this assay can detect is 138 copies/mL. A negative result does not preclude SARS-Cov-2 infection and should not be used as the sole basis for treatment or other patient management decisions. A negative result may occur with  improper specimen collection/handling, submission of specimen other than nasopharyngeal  swab, presence of viral mutation(s) within the areas targeted by this assay, and inadequate number of viral copies(<138 copies/mL). A negative result must be combined with clinical observations, patient history, and epidemiological information. The expected result is Negative.  Fact Sheet for Patients:  BloggerCourse.com  Fact Sheet for Healthcare Providers:  SeriousBroker.it  This test is no t yet approved or cleared by the [place] FDA and  has been authorized for detection and/or diagnosis of SARS-CoV-2 by FDA under an Emergency Use Authorization (EUA). This EUA will remain  in effect (meaning this test can be used) for the duration of the COVID-19 declaration under Section 564(b)(1) of the Act, 21 U.S.C.section 360bbb-3(b)(1), unless the authorization is terminated  or revoked sooner.       Influenza A by PCR NEGATIVE NEGATIVE Final   Influenza B by PCR NEGATIVE NEGATIVE Final    Comment: (NOTE)  The Xpert Xpress SARS-CoV-2/FLU/RSV plus assay is intended as an aid in the diagnosis of influenza from Nasopharyngeal swab specimens and should not be used as a sole basis for treatment. Nasal washings and aspirates are unacceptable for Xpert Xpress SARS-CoV-2/FLU/RSV testing.  Fact Sheet for Patients: BloggerCourse.com  Fact Sheet for Healthcare Providers: SeriousBroker.it  This test is not yet approved or cleared by the [place] FDA and has been authorized for detection and/or diagnosis of SARS-CoV-2 by FDA under an Emergency Use Authorization (EUA). This EUA will remain in effect (meaning this test can be used) for the duration of the COVID-19 declaration under Section 564(b)(1) of the Act, 21 U.S.C. section 360bbb-3(b)(1), unless the authorization is terminated or revoked.     Resp Syncytial Virus by PCR NEGATIVE NEGATIVE Final    Comment: (NOTE) Fact Sheet for  Patients: BloggerCourse.com  Fact Sheet for Healthcare Providers: SeriousBroker.it  This test is not yet approved or cleared by the [place] FDA and has been authorized for detection and/or diagnosis of SARS-CoV-2 by FDA under an Emergency Use Authorization (EUA). This EUA will remain in effect (meaning this test can be used) for the duration of the COVID-19 declaration under Section 564(b)(1) of the Act, 21 U.S.C. section 360bbb-3(b)(1), unless the authorization is terminated or revoked.  Performed at [facility], [address] [place], [place] [address]   MRSA Next Gen by PCR, Nasal     Status: None   Collection Time: 09/04/22 10:27 PM   Specimen: Nasal Mucosa; Nasal Swab  Result Value Ref Range Status   MRSA by PCR Next Gen NOT DETECTED NOT DETECTED Final    Comment: (NOTE) The GeneXpert MRSA Assay (FDA approved for NASAL specimens only), is one component of a comprehensive MRSA colonization surveillance program. It is not intended to diagnose MRSA infection nor to guide or monitor treatment for MRSA infections. Test performance is not FDA approved in patients less than 67 years old. Performed at [facility], [address] [place], [place] [address]          Radiology Studies: CT ABDOMEN PELVIS W CONTRAST  Result Date: 09/04/2022 CLINICAL DATA:  Nausea vomiting and abdominal pain. Suspected pancreatitis. EXAM: CT ABDOMEN AND PELVIS WITH CONTRAST TECHNIQUE: Multidetector CT imaging of the abdomen and pelvis was performed using the standard protocol following bolus administration of intravenous contrast. RADIATION DOSE REDUCTION: This exam was performed according to the departmental dose-optimization program which includes automated exposure control, adjustment of the mA and/or kV according to patient size and/or use of iterative reconstruction technique. CONTRAST:   OMNIPAQUE IOHEXOL 300 MG/ML  SOLN COMPARISON:  12/06/2019 FINDINGS: Lower chest: Unremarkable Hepatobiliary: Imaging is performed prior to hepatic vein opacification. Gallbladder unremarkable. No biliary dilatation observed. No focal hepatic lesion identified. Pancreas: Unremarkable. No findings of pancreatic pseudocyst, abscess, or necrosis. No definite abnormal peripancreatic inflammatory stranding. Please note that lipase levels may be more sensitive than CT for detecting early acute pancreatitis. Spleen: Unremarkable Adrenals/Urinary Tract: Unremarkable Stomach/Bowel: Diffuse well wall thickening of the otherwise empty colon favoring colitis. No pneumatosis or portal venous gas. No dilated small bowel. Tubular right lower quadrant structure favoring appendix adjacent to the cecum does not appear inflamed. Vascular/Lymphatic: Unremarkable Reproductive: Unremarkable Other: No supplemental non-categorized findings. Musculoskeletal: On images 2-3 of series 5 there is periosteal reaction along the left lateral seventh rib which could indicate a healing rib fracture. IMPRESSION: 1. Diffuse well wall thickening of the otherwise empty colon favoring colitis. No pneumatosis  or portal venous gas. 2. The pancreas appears normal. Please note that lipase levels may be more sensitive than CT for detecting early acute pancreatitis. 3. Periosteal reaction along the left lateral seventh rib could indicate a healing rib fracture. Electronically Signed   By: Gaylyn Rong M.D.   On: 09/04/2022 16:47        Scheduled Meds:  Chlorhexidine Gluconate Cloth  6 each Topical Daily   enoxaparin (LOVENOX) injection  40 mg Subcutaneous Q24H   norethindrone-ethinyl estradiol-FE  1 tablet Oral Daily   Continuous Infusions:  dextrose 5% lactated ringers 125 mL/hr at 09/06/22 0727   famotidine (PEPCID) IV Stopped (09/06/22 0438)   insulin 2.6 Units/hr (09/06/22 0727)   lactated ringers Stopped (09/04/22 2230)    piperacillin-tazobactam (ZOSYN)  IV 12.5 mL/hr at 09/06/22 0727     LOS: 1 day    Time spent: 35 minutes.     Alba Cory, MD [facility]   If 7PM-7AM, please contact night-coverage [email or web address]  09/06/2022, 7:38 AM

## 2022-09-06 NOTE — Progress Notes (Signed)
Pt stable at time of transfer. Report given.  

## 2022-09-07 DIAGNOSIS — E1010 Type 1 diabetes mellitus with ketoacidosis without coma: Secondary | ICD-10-CM | POA: Diagnosis not present

## 2022-09-07 LAB — BASIC METABOLIC PANEL
Anion gap: 11 (ref 5–15)
Anion gap: 6 (ref 5–15)
BUN: 6 mg/dL (ref 6–20)
BUN: 8 mg/dL (ref 6–20)
CO2: 19 mmol/L — ABNORMAL LOW (ref 22–32)
CO2: 24 mmol/L (ref 22–32)
Calcium: 8.4 mg/dL — ABNORMAL LOW (ref 8.9–10.3)
Calcium: 8.5 mg/dL — ABNORMAL LOW (ref 8.9–10.3)
Chloride: 98 mmol/L (ref 98–111)
Chloride: 102 mmol/L (ref 98–111)
Creatinine, Ser: 0.94 mg/dL (ref 0.44–1.00)
Creatinine, Ser: 0.96 mg/dL (ref 0.44–1.00)
GFR, Estimated: >60 mL/min (ref >60)
GFR, Estimated: >60 mL/min (ref >60)
Glucose, Bld: 388 mg/dL — ABNORMAL HIGH (ref 70–99)
Glucose, Bld: 188 mg/dL — ABNORMAL HIGH (ref 70–99)
Potassium: 4.4 mmol/L (ref 3.5–5.1)
Potassium: 3.5 mmol/L (ref 3.5–5.1)
Sodium: 128 mmol/L — ABNORMAL LOW (ref 135–145)
Sodium: 132 mmol/L — ABNORMAL LOW (ref 135–145)

## 2022-09-07 LAB — GLUCOSE, CAPILLARY
Glucose-Capillary: 335 mg/dL — ABNORMAL HIGH (ref 70–99)
Glucose-Capillary: 401 mg/dL — ABNORMAL HIGH (ref 70–99)
Glucose-Capillary: 197 mg/dL — ABNORMAL HIGH (ref 70–99)
Glucose-Capillary: 182 mg/dL — ABNORMAL HIGH (ref 70–99)
Glucose-Capillary: 275 mg/dL — ABNORMAL HIGH (ref 70–99)

## 2022-09-07 MED ORDER — INSULIN ASPART 100 UNIT/ML IJ SOLN
0.0000 [IU] | Freq: Three times a day (TID) | INTRAMUSCULAR | Status: DC
  Administered 2022-09-07 (×2): 3 [IU] via SUBCUTANEOUS
  Administered 2022-09-07: 15 [IU] via SUBCUTANEOUS
  Administered 2022-09-08: 8 [IU] via SUBCUTANEOUS

## 2022-09-07 MED ORDER — POTASSIUM CHLORIDE CRYS ER 20 MEQ PO TBCR
40.0000 meq | EXTENDED_RELEASE_TABLET | Freq: Once | ORAL | Status: AC
  Administered 2022-09-07: 40 meq via ORAL
  Filled 2022-09-07: qty 2

## 2022-09-07 MED ORDER — INSULIN GLARGINE-YFGN 100 UNIT/ML ~~LOC~~ SOLN
25.0000 [IU] | Freq: Every day | SUBCUTANEOUS | Status: DC
  Administered 2022-09-07 – 2022-09-08 (×2): 25 [IU] via SUBCUTANEOUS
  Filled 2022-09-07 (×2): qty 0.25

## 2022-09-07 MED ORDER — INSULIN GLARGINE-YFGN 100 UNIT/ML ~~LOC~~ SOLN
20.0000 [IU] | Freq: Every day | SUBCUTANEOUS | Status: DC
  Filled 2022-09-07: qty 0.2

## 2022-09-07 MED ORDER — METRONIDAZOLE 500 MG PO TABS
500.0000 mg | ORAL_TABLET | Freq: Two times a day (BID) | ORAL | Status: DC
  Administered 2022-09-07 – 2022-09-08 (×3): 500 mg via ORAL
  Filled 2022-09-07 (×3): qty 1

## 2022-09-07 MED ORDER — CEPHALEXIN 500 MG PO CAPS
500.0000 mg | ORAL_CAPSULE | Freq: Three times a day (TID) | ORAL | Status: DC
  Administered 2022-09-07 – 2022-09-08 (×4): 500 mg via ORAL
  Filled 2022-09-07 (×4): qty 1

## 2022-09-07 MED ORDER — INSULIN ASPART 100 UNIT/ML IJ SOLN
5.0000 [IU] | Freq: Three times a day (TID) | INTRAMUSCULAR | Status: DC
  Administered 2022-09-07 – 2022-09-08 (×4): 5 [IU] via SUBCUTANEOUS

## 2022-09-07 MED ORDER — SODIUM CHLORIDE 0.9 % IV BOLUS
1000.0000 mL | Freq: Once | INTRAVENOUS | Status: AC
  Administered 2022-09-07: 1000 mL via INTRAVENOUS

## 2022-09-07 MED ORDER — MELATONIN 5 MG PO TABS
5.0000 mg | ORAL_TABLET | Freq: Every evening | ORAL | Status: DC | PRN
  Administered 2022-09-07: 5 mg via ORAL
  Filled 2022-09-07: qty 1

## 2022-09-07 MED ORDER — INSULIN ASPART 100 UNIT/ML IJ SOLN
0.0000 [IU] | Freq: Every day | INTRAMUSCULAR | Status: DC
  Administered 2022-09-07: 3 [IU] via SUBCUTANEOUS

## 2022-09-07 MED ORDER — INSULIN ASPART 100 UNIT/ML IJ SOLN
0.0000 [IU] | Freq: Three times a day (TID) | INTRAMUSCULAR | Status: DC
Start: 2022-09-07 — End: 2022-09-07

## 2022-09-07 MED ORDER — ALPRAZOLAM 0.25 MG PO TABS
0.2500 mg | ORAL_TABLET | Freq: Every day | ORAL | Status: DC | PRN
  Administered 2022-09-07: 0.25 mg via ORAL
  Filled 2022-09-07: qty 1

## 2022-09-07 NOTE — Progress Notes (Signed)
  Transition of Care (TOC) Screening Note   Patient Details  Name: Judy Cook Date of Birth: [birth year]   Transition of Care [facility]) CM/SW Contact:    Adrian Prows, RN Phone Number: 09/07/2022, 10:18 AM    Transition of Care Department [facility]) has reviewed patient and no TOC needs have been identified at this time. We will continue to monitor patient advancement through interdisciplinary progression rounds. If new patient transition needs arise, please place a TOC consult.

## 2022-09-07 NOTE — Inpatient Diabetes Management (Signed)
Inpatient Diabetes Program Recommendations  AACE/ADA: New Consensus Statement on Inpatient Glycemic Control (2015)  Target Ranges:  Prepandial:   less than 140 mg/dL      Peak postprandial:   less than 180 mg/dL (1-2 hours)      Critically ill patients:  140 - 180 mg/dL   Lab Results  Component Value Date   GLUCAP 197 (H) 09/07/2022   HGBA1C 7.6 (H) 09/05/2022    Review of Glycemic Control  Diabetes history: DM2 Outpatient Diabetes medications: Insulin pump Current orders for Inpatient glycemic control: Semglee 25 QD, Novolog 0-15 TID with meals and 0-5 HS + 5 units TID  Insulin pump: 0.7 units/H   - 16.8 Total units CF 45 with goal of 120 I:C - 1:10  HgbA1C - 7.6% Eating 80%  Inpatient Diabetes Program Recommendations:    Spoke with pt at bedside regarding her diabetes. Basal insulin increased this am. Instructed pt to wait 24H prior to restarting basal portion on insulin pump when discharged. Can start bolus (correction and meal coverage) when she gets home. Pt voiced understanding.  Blood sugar 401, 388 this am?? Not in DKA.  ? Po intake overnight.   Will continue to follow.  Thank you. Ailene Ards, RD, LDN, CDCES Inpatient Diabetes Coordinator [phone]

## 2022-09-07 NOTE — Progress Notes (Signed)
PROGRESS NOTE    Judy Cook  ZOX:[id number] DOB: [birth year] DOA: 09/04/2022 PCP: Elenore Paddy, NP   Brief Narrative: 22 year old with past medical history significant for diabetes type 1 on insulin pump presented to the ED with nausea, vomiting and abdominal discomfort.  She reported that her insulin pump was not functioning as she also brought of her supplies since 5/12.  She presented with abdominal discomfort, epigastric area.  She reported dizziness.  CT abdomen and pelvis showed diffuse wall thickening, favoring colitis.  Patient was admitted for  DKA.  She was initiated on insulin drip, DKA has resolved.  She continued to have abdominal pain and unable to tolerate diet, she has remained on insulin drip and D 5.    Assessment & Plan:   Principal Problem:   DKA (diabetic ketoacidosis) (HCC)   1-DKA Type 1 diabetes on insulin pump with uncontrolled hyperglycemia: Admitted for DKA.  She remain on Insulin gtt and D 5 because of unable to tolerates oral.  She was transition to [facility] and meals coverage and SSI on 5/16. CBG has been elevated overnight, 400 this am: Plan to increase Semglee to 25 units, change SSI coverage to moderate. Increase meal coverage to 5 units. 1 L IV bolus , increase IV fluids.  Repeat B-met at noon.   Nausea, Vomiting abdominal discomfort: ? Colitis on CT scan. s.  Pain could had be related to DKA as well.  Tolerating diet.  Report loose stool. Denies abdominal pain.  Change antibiotics to Keflex/Flagyl.   Hyperbilirubinemia: Mild . Resolved. Suspect related to dehydration.   Hypokalemia; Replete orally.   Hypomagnesemia; Replaced.   Pseudohyponatremia; in setting of hyperglycemia. Correction sodium 138     Estimated body mass index is 27.34 kg/m as calculated from the following:   Height as of this encounter: 5' (1.524 m).   Weight as of this encounter: 63.5 kg.   DVT prophylaxis: Heparin Code Status: full code Family  Communication: Care discussed with patient.  Disposition Plan:  Status is: Inpatient Remains inpatient appropriate because: plan to adjust insulin regime today, CBG too high, needs better blood sugar controlled.    Consultants:  None  Procedures:  None  Antimicrobials:    Subjective: She report no more abdominal pain, no nausea. She report loose stool. 2 episodes.   Objective: Vitals:   09/06/22 1753 09/06/22 1935 09/06/22 2343 09/07/22 0414  BP: (!) 139/99 124/87 (!) 128/90 (!) 131/90  Pulse: 68 79 76 67  Resp: 15 16 14 16   Temp: 98.9 F (37.2 C) 98.4 F (36.9 C) 98.5 F (36.9 C) 98.2 F (36.8 C)  TempSrc: Oral Oral Oral Oral  SpO2: 100% 100% 100% 100%  Weight:      Height:        Intake/Output Summary (Last 24 hours) at 09/07/2022 1227 Last data filed at 09/07/2022 1000 Gross per 24 hour  Intake 2471.53 ml  Output 200 ml  Net 2271.53 ml    Filed Weights   09/04/22 1244 09/04/22 2233  Weight: 65.8 kg 63.5 kg    Examination:  General exam: NAD Respiratory system: CTA Cardiovascular system: S 1, S 2 RRR Gastrointestinal system: BS present, soft, nt  Central nervous system: Alert, follows command Extremities: Symmetric power.    Data Reviewed: I have personally reviewed following labs and imaging studies  CBC: Recent Labs  Lab 09/04/22 1318  WBC 8.0  NEUTROABS 6.5  HGB 12.7  HCT 39.1  MCV 85.9  PLT 428*    Basic  Metabolic Panel: Recent Labs  Lab 09/04/22 1958 09/05/22 0323 09/05/22 0802 09/06/22 0653 09/06/22 0654 09/07/22 0719 09/07/22 1142  NA 132* 135 134*  --  137 128* 132*  K 4.0 3.9 3.4*  --  3.0* 4.4 3.5  CL 100 104 106  --  105 98 102  CO2 13* 20* 20*  --  25 19* 24  GLUCOSE 466* 165* 113*  --  136* 388* 188*  BUN 10 7 6   --  <5* 6 8  CREATININE 0.95 0.80 0.68  --  0.76 0.94 0.96  CALCIUM 9.0 9.1 8.8*  --  8.2* 8.4* 8.5*  MG 1.7 1.5*  --  1.8  --   --   --   PHOS 4.0 2.4*  --   --   --   --   --     GFR: Estimated  Creatinine Clearance: 75.8 mL/min (by C-G formula based on SCr of 0.96 mg/dL). Liver Function Tests: Recent Labs  Lab 09/04/22 1620 09/04/22 1823  AST 36 33  ALT 22 26  ALKPHOS 70 80  BILITOT 1.3* 1.1  PROT 7.1 7.6  ALBUMIN 3.6 4.0    Recent Labs  Lab 09/04/22 1620  LIPASE 22    No results for input(s): "AMMONIA" in the last 168 hours. Coagulation Profile: No results for input(s): "INR", "PROTIME" in the last 168 hours. Cardiac Enzymes: No results for input(s): "CKTOTAL", "CKMB", "CKMBINDEX", "TROPONINI" in the last 168 hours. BNP (last 3 results) No results for input(s): "PROBNP" in the last 8760 hours. HbA1C: Recent Labs    09/05/22 0323  HGBA1C 7.6*    CBG: Recent Labs  Lab 09/06/22 1543 09/06/22 2125 09/07/22 0428 09/07/22 0735 09/07/22 1138  GLUCAP 210* 376* 335* 401* 197*    Lipid Profile: No results for input(s): "CHOL", "HDL", "LDLCALC", "TRIG", "CHOLHDL", "LDLDIRECT" in the last 72 hours. Thyroid Function Tests: No results for input(s): "TSH", "T4TOTAL", "FREET4", "T3FREE", "THYROIDAB" in the last 72 hours. Anemia Panel: No results for input(s): "VITAMINB12", "FOLATE", "FERRITIN", "TIBC", "IRON", "RETICCTPCT" in the last 72 hours. Sepsis Labs: No results for input(s): "PROCALCITON", "LATICACIDVEN" in the last 168 hours.  Recent Results (from the past 240 hour(s))  Resp panel by RT-PCR (RSV, Flu A&B, Covid) Anterior Nasal Swab     Status: None   Collection Time: 09/04/22  1:25 PM   Specimen: Anterior Nasal Swab  Result Value Ref Range Status   SARS Coronavirus 2 by RT PCR NEGATIVE NEGATIVE Final    Comment: (NOTE) SARS-CoV-2 target nucleic acids are NOT DETECTED.  The SARS-CoV-2 RNA is generally detectable in upper respiratory specimens during the acute phase of infection. The lowest concentration of SARS-CoV-2 viral copies this assay can detect is 138 copies/mL. A negative result does not preclude SARS-Cov-2 infection and should not be used  as the sole basis for treatment or other patient management decisions. A negative result may occur with  improper specimen collection/handling, submission of specimen other than nasopharyngeal swab, presence of viral mutation(s) within the areas targeted by this assay, and inadequate number of viral copies(<138 copies/mL). A negative result must be combined with clinical observations, patient history, and epidemiological information. The expected result is Negative.  Fact Sheet for Patients:  BloggerCourse.com  Fact Sheet for Healthcare Providers:  SeriousBroker.it  This test is no t yet approved or cleared by the [place] FDA and  has been authorized for detection and/or diagnosis of SARS-CoV-2 by FDA under an Emergency Use Authorization (EUA). This EUA will remain  in effect (meaning this test can be used) for the duration of the COVID-19 declaration under Section 564(b)(1) of the Act, 21 U.S.C.section 360bbb-3(b)(1), unless the authorization is terminated  or revoked sooner.       Influenza A by PCR NEGATIVE NEGATIVE Final   Influenza B by PCR NEGATIVE NEGATIVE Final    Comment: (NOTE) The Xpert Xpress SARS-CoV-2/FLU/RSV plus assay is intended as an aid in the diagnosis of influenza from Nasopharyngeal swab specimens and should not be used as a sole basis for treatment. Nasal washings and aspirates are unacceptable for Xpert Xpress SARS-CoV-2/FLU/RSV testing.  Fact Sheet for Patients: BloggerCourse.com  Fact Sheet for Healthcare Providers: SeriousBroker.it  This test is not yet approved or cleared by the [place] FDA and has been authorized for detection and/or diagnosis of SARS-CoV-2 by FDA under an Emergency Use Authorization (EUA). This EUA will remain in effect (meaning this test can be used) for the duration of the COVID-19 declaration under Section 564(b)(1)  of the Act, 21 U.S.C. section 360bbb-3(b)(1), unless the authorization is terminated or revoked.     Resp Syncytial Virus by PCR NEGATIVE NEGATIVE Final    Comment: (NOTE) Fact Sheet for Patients: BloggerCourse.com  Fact Sheet for Healthcare Providers: SeriousBroker.it  This test is not yet approved or cleared by the [place] FDA and has been authorized for detection and/or diagnosis of SARS-CoV-2 by FDA under an Emergency Use Authorization (EUA). This EUA will remain in effect (meaning this test can be used) for the duration of the COVID-19 declaration under Section 564(b)(1) of the Act, 21 U.S.C. section 360bbb-3(b)(1), unless the authorization is terminated or revoked.  Performed at [facility], [address], [place], [place] [address]   MRSA Next Gen by PCR, Nasal     Status: None   Collection Time: 09/04/22 10:27 PM   Specimen: Nasal Mucosa; Nasal Swab  Result Value Ref Range Status   MRSA by PCR Next Gen NOT DETECTED NOT DETECTED Final    Comment: (NOTE) The GeneXpert MRSA Assay (FDA approved for NASAL specimens only), is one component of a comprehensive MRSA colonization surveillance program. It is not intended to diagnose MRSA infection nor to guide or monitor treatment for MRSA infections. Test performance is not FDA approved in patients less than 82 years old. Performed at [facility], [address], [place], [place] [address]          Radiology Studies: No results found.      Scheduled Meds:  cephALEXin  500 mg Oral Q8H   Chlorhexidine Gluconate Cloth  6 each Topical Daily   enoxaparin (LOVENOX) injection  40 mg Subcutaneous Q24H   insulin aspart  0-15 Units Subcutaneous TID WC   insulin aspart  0-5 Units Subcutaneous QHS   insulin aspart  5 Units Subcutaneous TID WC   insulin glargine-yfgn  25 Units Subcutaneous Daily   metroNIDAZOLE  500 mg Oral Q12H    norethindrone-ethinyl estradiol-FE  1 tablet Oral Daily   potassium chloride  40 mEq Oral Once   Continuous Infusions:  sodium chloride 125 mL/hr at 09/07/22 1208   famotidine (PEPCID) IV 20 mg (09/07/22 1036)     LOS: 2 days    Time spent: 35 minutes.     Alba Cory, MD [facility]   If 7PM-7AM, please contact night-coverage [email or web address]  09/07/2022, 12:27 PM

## 2022-09-08 DIAGNOSIS — E1165 Type 2 diabetes mellitus with hyperglycemia: Secondary | ICD-10-CM | POA: Diagnosis not present

## 2022-09-08 LAB — GLUCOSE, CAPILLARY: Glucose-Capillary: 263 mg/dL — ABNORMAL HIGH (ref 70–99)

## 2022-09-08 MED ORDER — METRONIDAZOLE 500 MG PO TABS: 500.0000 mg | ORAL_TABLET | Freq: Two times a day (BID) | ORAL | 0 refills | Status: AC

## 2022-09-08 MED ORDER — CEPHALEXIN 500 MG PO CAPS: 500.0000 mg | ORAL_CAPSULE | Freq: Three times a day (TID) | ORAL | 0 refills | Status: AC

## 2022-09-08 MED ORDER — INSULIN ASPART 100 UNIT/ML IJ SOLN
6.0000 [IU] | Freq: Three times a day (TID) | INTRAMUSCULAR | Status: DC
  Administered 2022-09-08: 6 [IU] via SUBCUTANEOUS

## 2022-09-08 NOTE — Plan of Care (Signed)
Patient is stable for discharge. Discharge instructions have been given. All questions answered, patient is discharged to home with family.  

## 2022-09-08 NOTE — Discharge Summary (Signed)
Physician Discharge Summary   Patient: Judy Cook MRN: [id number] DOB: 06-16-2000  Admit date:     09/04/2022  Discharge date: 09/08/22  Discharge Physician: Alba Cory   PCP: Elenore Paddy, NP   Recommendations at discharge:    Follow up Resolution of Colitis.  Needs further adjustment of insulin regimen.   Discharge Diagnoses: Principal Problem:   DKA (diabetic ketoacidosis) (HCC)  Resolved Problems:   * No resolved hospital problems. *  Hospital Course: 22 year old with past medical history significant for diabetes type 1 on insulin pump presented to the ED with nausea, vomiting and abdominal discomfort.  She reported that her insulin pump was not functioning as she also brought of her supplies since 5/12.  She presented with abdominal discomfort, epigastric area.  She reported dizziness.  CT abdomen and pelvis showed diffuse wall thickening, favoring colitis.   Patient was admitted for  DKA.  She was initiated on insulin drip, DKA has resolved.  She continued to have abdominal pain and unable to tolerate diet, she has remained on insulin drip and D 5.     Assessment and Plan: 1-DKA -Type 1 diabetes on insulin pump with uncontrolled hyperglycemia: Admitted for DKA.  She was treated with  Insulin gtt and D -/5 - She was transition to [facility] and meals coverage and SSI on 5/16. CBG has been elevated overnight, 400 on 5/16: Semglee was increased  to 25 units, change SSI coverage to moderate. Increase meal coverage to 5 units. 1 L IV bolus , increase IV fluids.  CBG better controlled. She will be discharge home. She was advised to resume long acting insulin tomorrow. She should use short acting today.    Nausea, Vomiting abdominal discomfort: ? Colitis on CT scan. s.  Pain could had be related to DKA as well.  Tolerating diet.  Report loose stool. Denies abdominal pain.  Change antibiotics to Keflex/Flagyl. Discharge on 5 days of antibiotics. She received 3  days while in house.    Hyperbilirubinemia: Mild . Resolved. Suspect related to dehydration.    Hypokalemia; Replaced.    Hypomagnesemia; Replaced.    Pseudohyponatremia; in setting of hyperglycemia. Correction sodium 138           Consultants: None Procedures performed: None Disposition: Home Diet recommendation:  Discharge Diet Orders (From admission, onward)     Start     Ordered   09/08/22 0000  Diet - low sodium heart healthy        09/08/22 0836           Carb modified diet DISCHARGE MEDICATION: Allergies as of 09/08/2022   No Known Allergies      Medication List     STOP taking these medications    pantoprazole 40 MG tablet Commonly known as: Protonix       TAKE these medications    cephALEXin 500 MG capsule Commonly known as: KEFLEX Take 1 capsule (500 mg total) by mouth every 8 (eight) hours for 5 days.   insulin aspart 100 UNIT/ML injection Commonly known as: NovoLOG Max Daily 60 units per pump   metroNIDAZOLE 500 MG tablet Commonly known as: FLAGYL Take 1 tablet (500 mg total) by mouth every 12 (twelve) hours for 5 days.   norethindrone-ethinyl estradiol-FE 1-20 MG-MCG tablet Commonly known as: LOESTRIN FE Take 1 tablet by mouth daily.        Follow-up Information     Elenore Paddy, NP Follow up in 1 week(s).   Specialty: Nurse  Practitioner Contact information: [address] [place] [address] [phone]                Discharge Exam: Ceasar Mons Weights   09/04/22 1244 09/04/22 2233  Weight: 65.8 kg 63.5 kg   General; NAD  Condition at discharge: stable  The results of significant diagnostics from this hospitalization (including imaging, microbiology, ancillary and laboratory) are listed below for reference.   Imaging Studies: CT ABDOMEN PELVIS W CONTRAST  Result Date: 09/04/2022 CLINICAL DATA:  Nausea vomiting and abdominal pain. Suspected pancreatitis. EXAM: CT ABDOMEN AND PELVIS WITH CONTRAST  TECHNIQUE: Multidetector CT imaging of the abdomen and pelvis was performed using the standard protocol following bolus administration of intravenous contrast. RADIATION DOSE REDUCTION: This exam was performed according to the departmental dose-optimization program which includes automated exposure control, adjustment of the mA and/or kV according to patient size and/or use of iterative reconstruction technique. CONTRAST:  OMNIPAQUE IOHEXOL 300 MG/ML  SOLN COMPARISON:  12/06/2019 FINDINGS: Lower chest: Unremarkable Hepatobiliary: Imaging is performed prior to hepatic vein opacification. Gallbladder unremarkable. No biliary dilatation observed. No focal hepatic lesion identified. Pancreas: Unremarkable. No findings of pancreatic pseudocyst, abscess, or necrosis. No definite abnormal peripancreatic inflammatory stranding. Please note that lipase levels may be more sensitive than CT for detecting early acute pancreatitis. Spleen: Unremarkable Adrenals/Urinary Tract: Unremarkable Stomach/Bowel: Diffuse well wall thickening of the otherwise empty colon favoring colitis. No pneumatosis or portal venous gas. No dilated small bowel. Tubular right lower quadrant structure favoring appendix adjacent to the cecum does not appear inflamed. Vascular/Lymphatic: Unremarkable Reproductive: Unremarkable Other: No supplemental non-categorized findings. Musculoskeletal: On images 2-3 of series 5 there is periosteal reaction along the left lateral seventh rib which could indicate a healing rib fracture. IMPRESSION: 1. Diffuse well wall thickening of the otherwise empty colon favoring colitis. No pneumatosis or portal venous gas. 2. The pancreas appears normal. Please note that lipase levels may be more sensitive than CT for detecting early acute pancreatitis. 3. Periosteal reaction along the left lateral seventh rib could indicate a healing rib fracture. Electronically Signed   By: Gaylyn Rong M.D.   On: 09/04/2022 16:47     Microbiology: Results for orders placed or performed during the hospital encounter of 09/04/22  Resp panel by RT-PCR (RSV, Flu A&B, Covid) Anterior Nasal Swab     Status: None   Collection Time: 09/04/22  1:25 PM   Specimen: Anterior Nasal Swab  Result Value Ref Range Status   SARS Coronavirus 2 by RT PCR NEGATIVE NEGATIVE Final    Comment: (NOTE) SARS-CoV-2 target nucleic acids are NOT DETECTED.  The SARS-CoV-2 RNA is generally detectable in upper respiratory specimens during the acute phase of infection. The lowest concentration of SARS-CoV-2 viral copies this assay can detect is 138 copies/mL. A negative result does not preclude SARS-Cov-2 infection and should not be used as the sole basis for treatment or other patient management decisions. A negative result may occur with  improper specimen collection/handling, submission of specimen other than nasopharyngeal swab, presence of viral mutation(s) within the areas targeted by this assay, and inadequate number of viral copies(<138 copies/mL). A negative result must be combined with clinical observations, patient history, and epidemiological information. The expected result is Negative.  Fact Sheet for Patients:  BloggerCourse.com  Fact Sheet for Healthcare Providers:  SeriousBroker.it  This test is no t yet approved or cleared by the [place] FDA and  has been authorized for detection and/or diagnosis of SARS-CoV-2 by FDA under  an Emergency Use Authorization (EUA). This EUA will remain  in effect (meaning this test can be used) for the duration of the COVID-19 declaration under Section 564(b)(1) of the Act, 21 U.S.C.section 360bbb-3(b)(1), unless the authorization is terminated  or revoked sooner.       Influenza A by PCR NEGATIVE NEGATIVE Final   Influenza B by PCR NEGATIVE NEGATIVE Final    Comment: (NOTE) The Xpert Xpress SARS-CoV-2/FLU/RSV plus assay is intended  as an aid in the diagnosis of influenza from Nasopharyngeal swab specimens and should not be used as a sole basis for treatment. Nasal washings and aspirates are unacceptable for Xpert Xpress SARS-CoV-2/FLU/RSV testing.  Fact Sheet for Patients: BloggerCourse.com  Fact Sheet for Healthcare Providers: SeriousBroker.it  This test is not yet approved or cleared by the [place] FDA and has been authorized for detection and/or diagnosis of SARS-CoV-2 by FDA under an Emergency Use Authorization (EUA). This EUA will remain in effect (meaning this test can be used) for the duration of the COVID-19 declaration under Section 564(b)(1) of the Act, 21 U.S.C. section 360bbb-3(b)(1), unless the authorization is terminated or revoked.     Resp Syncytial Virus by PCR NEGATIVE NEGATIVE Final    Comment: (NOTE) Fact Sheet for Patients: BloggerCourse.com  Fact Sheet for Healthcare Providers: SeriousBroker.it  This test is not yet approved or cleared by the [place] FDA and has been authorized for detection and/or diagnosis of SARS-CoV-2 by FDA under an Emergency Use Authorization (EUA). This EUA will remain in effect (meaning this test can be used) for the duration of the COVID-19 declaration under Section 564(b)(1) of the Act, 21 U.S.C. section 360bbb-3(b)(1), unless the authorization is terminated or revoked.  Performed at [facility] [address] [place], [place] [address]   MRSA Next Gen by PCR, Nasal     Status: None   Collection Time: 09/04/22 10:27 PM   Specimen: Nasal Mucosa; Nasal Swab  Result Value Ref Range Status   MRSA by PCR Next Gen NOT DETECTED NOT DETECTED Final    Comment: (NOTE) The GeneXpert MRSA Assay (FDA approved for NASAL specimens only), is one component of a comprehensive MRSA colonization surveillance program. It is not intended to  diagnose MRSA infection nor to guide or monitor treatment for MRSA infections. Test performance is not FDA approved in patients less than 72 years old. Performed at [facility], [address] [place], [place] [address]     Labs: CBC: Recent Labs  Lab 09/04/22 1318  WBC 8.0  NEUTROABS 6.5  HGB 12.7  HCT 39.1  MCV 85.9  PLT 428*   Basic Metabolic Panel: Recent Labs  Lab 09/04/22 1958 09/05/22 0323 09/05/22 0802 09/06/22 0653 09/06/22 0654 09/07/22 0719 09/07/22 1142  NA 132* 135 134*  --  137 128* 132*  K 4.0 3.9 3.4*  --  3.0* 4.4 3.5  CL 100 104 106  --  105 98 102  CO2 13* 20* 20*  --  25 19* 24  GLUCOSE 466* 165* 113*  --  136* 388* 188*  BUN 10 7 6   --  <5* 6 8  CREATININE 0.95 0.80 0.68  --  0.76 0.94 0.96  CALCIUM 9.0 9.1 8.8*  --  8.2* 8.4* 8.5*  MG 1.7 1.5*  --  1.8  --   --   --   PHOS 4.0 2.4*  --   --   --   --   --    Liver  Function Tests: Recent Labs  Lab 09/04/22 1620 09/04/22 1823  AST 36 33  ALT 22 26  ALKPHOS 70 80  BILITOT 1.3* 1.1  PROT 7.1 7.6  ALBUMIN 3.6 4.0   CBG: Recent Labs  Lab 09/07/22 0735 09/07/22 1138 09/07/22 1627 09/07/22 2214 09/08/22 0650  GLUCAP 401* 197* 182* 275* 263*    Discharge time spent: greater than 30 minutes.  Signed: Alba Cory, MD [facility] 09/08/2022

## 2022-09-08 NOTE — Consult Note (Signed)
   [facility] CM Inpatient Consult   09/08/2022  [place] [birth year] [id number]  [facility] [[facility]]  Accountable Care Organization [ACO] Patient: [employer] [facility]  *[facility] Liaison remote coverage review and telephone for patient admitted to [facility]    Primary Care Provider:  Elenore Paddy, NP with Corinda Gubler at [facility]   Patient screened for hospitalization with noted medium risk score for unplanned readmission risk to assess for potential [facility]  [[facility]] Care Management service needs for post hospital transition for care coordination.  Review of patient's electronic medical record H & P reveals patient with T1DM ran out of supplies for pump.  Call attempt to bedside phone with no answer, will attempt call to phone number listed in demographics to offer post hospital follow up. Notes that patient has a discharge order. Call placed to phone number provided and spoke with patient HIPAA verified.  Patient states she is for transitioning home today and awaiting to make sure insulin is ordered for the pharmacy.  Explained reason for the call and to expect [facility] RN for follow up. Patient verbalizes understanding and appreciative of follow up.   Plan:  Referral request for community care coordination: Anticipate that Patient to have TOC follow up call from PCP listed. No other needs verbalized.  Of note, [facility] Care Management/Population Health does not replace or interfere with any arrangements made by the Inpatient Transition of Care team.  For questions contact:   Charlesetta Shanks, RN BSN CCM [facility]  [phone] business mobile phone Toll free office [phone]  *Concierge Line  [phone] Fax number: [phone] [email or web address]

## 2022-09-11 ENCOUNTER — Telehealth: Payer: Self-pay | Admitting: *Deleted

## 2022-09-11 ENCOUNTER — Encounter: Payer: Self-pay | Admitting: *Deleted

## 2022-09-11 NOTE — Progress Notes (Unsigned)
  Care Coordination  Outreach Note  09/11/2022 Name: Judy Cook MRN: [id number] DOB: 2000-07-25   Care Coordination Outreach Attempts: An unsuccessful telephone outreach was attempted today to offer the patient information about available care coordination services.  Follow Up Plan:  Additional outreach attempts will be made to offer the patient care coordination information and services.   Encounter Outcome:  No Answer  Burman Nieves, CCMA Care Coordination Care Guide Direct Dial: [phone]

## 2022-09-11 NOTE — Transitions of Care (Post Inpatient/ED Visit) (Signed)
   09/11/2022  Name: Judy Cook MRN: [id number] DOB: [birth year]  Today's TOC FU Call Status: Today's TOC FU Call Status:: Unsuccessul Call (1st Attempt) Unsuccessful Call (1st Attempt) Date: 09/11/22  Attempted to reach the patient regarding the most recent Inpatient visit.  Attempted call x 2- Received automated outgoing voice message stating call cannot be completed; unable to leave voice message requesting call-back   Follow Up Plan: Additional outreach attempts will be made to reach the patient to complete the Transitions of Care (Post Inpatient visit) call.   Caryl Pina, RN, BSN, CCRN Alumnus RN CM Care Coordination/ Transition of Care- [facility] Care Management [phone]: direct office

## 2022-09-12 ENCOUNTER — Telehealth: Payer: Self-pay | Admitting: *Deleted

## 2022-09-12 ENCOUNTER — Encounter: Payer: Self-pay | Admitting: *Deleted

## 2022-09-12 NOTE — Transitions of Care (Post Inpatient/ED Visit) (Signed)
09/12/2022  Name: Judy Cook MRN: [id number] DOB: 11/15/2000  Today's TOC FU Call Status: Today's TOC FU Call Status:: Successful TOC FU Call Competed TOC FU Call Complete Date: 09/12/22  Transition Care Management Follow-up Telephone Call Date of Discharge: 09/08/22 Discharge Facility: [facility]) Type of Discharge: Inpatient Admission Primary Inpatient Discharge Diagnosis:: DKA; N/V/ abdominal pain How have you been since you were released from the hospital?: Better ("I am much better; back to my normal self; taking the antibiotics as they told me to and managing the insulin pump without any problems.  I watch what I eat-- I have had the pump for several years and my A1-C is 7.0") Any questions or concerns?: No  Items Reviewed: Did you receive and understand the discharge instructions provided?: Yes (thoroughly reviewed with patient who verbalizes good understanding of same) Medications obtained,verified, and reconciled?: Yes (Medications Reviewed) (Full medication reconciliation/ review completed; no concerns or discrepancies identified; confirmed patient obtained/ is taking all newly Rx'd medications as instructed; self-manages medications and denies questions/ concerns around medications today) Any new allergies since your discharge?: No Dietary orders reviewed?: Yes Type of Diet Ordered:: Diabetic/ carb modified/ low sugar Do you have support at home?: Yes People in Home: friend(s) Name of Support/Comfort Primary Source: Reports independent in self-care activities; supportive roommate and family members assists as/ if needed/ indicated  Medications Reviewed Today: Medications Reviewed Today     Reviewed by Michaela Corner, RN (Registered Nurse) on 09/12/22 at 1024  Med List Status: <None>   Medication Order Taking? Sig Documenting Provider Last Dose Status Informant  cephALEXin (KEFLEX) 500 MG capsule [id number] Yes Take 1 capsule (500 mg total) by mouth every 8  (eight) hours for 5 days. Regalado, Belkys A, MD Taking Active   insulin aspart (NOVOLOG) 100 UNIT/ML injection [id number] Yes Max Daily 60 units per pump Shamleffer, Konrad Dolores, MD Taking Active Self, Pharmacy Records  metroNIDAZOLE (FLAGYL) 500 MG tablet [id number] Yes Take 1 tablet (500 mg total) by mouth every 12 (twelve) hours for 5 days. Regalado, Belkys A, MD Taking Active   norethindrone-ethinyl estradiol-FE (LOESTRIN FE) 1-20 MG-MCG tablet [id number] Yes Take 1 tablet by mouth daily. Jerene Bears, MD Taking Active Self, Pharmacy Records            Home Care and Equipment/Supplies: Were Home Health Services Ordered?: No Any new equipment or medical supplies ordered?: No  Functional Questionnaire: Do you need assistance with bathing/showering or dressing?: No Do you need assistance with meal preparation?: No Do you need assistance with eating?: No Do you have difficulty maintaining continence: No Do you need assistance with getting out of bed/getting out of a chair/moving?: No Do you have difficulty managing or taking your medications?: No  Follow up appointments reviewed: PCP Follow-up appointment confirmed?: Yes (care coordination outreach in real-time with scheduling care guide to successfully schedule hospital follow up PCP appointment 09/21/22) Date of PCP follow-up appointment?: 09/21/22 Follow-up Provider: PCP Specialist Hospital Follow-up appointment confirmed?: NA (verified not indicated per hospital discharging provider discharge notes ; verified has scheduled endocrinology appointment 12/07/22) Do you need transportation to your follow-up appointment?: No Do you understand care options if your condition(s) worsen?: Yes-patient verbalized understanding  SDOH Interventions Today    Flowsheet Row Most Recent Value  SDOH Interventions   Food Insecurity Interventions Intervention Not Indicated  Transportation Interventions Intervention Not Indicated  [drives self]       TOC Interventions Today    Flowsheet Row Most Recent Value  TOC Interventions   TOC Interventions Discussed/Reviewed TOC Interventions Discussed, Arranged PCP follow up less than 12 days/Care Guide scheduled      Interventions Today    Flowsheet Row Most Recent Value  Chronic Disease   Chronic disease during today's visit Diabetes, Other  [DKA hospital visit 5/13-17/2024]  General Interventions   General Interventions Discussed/Reviewed General Interventions Discussed, Doctor Visits, Referral to Nurse, Communication with, Labs  Labs Hgb A1c every 3 months  Doctor Visits Discussed/Reviewed Doctor Visits Discussed, PCP, Specialist  PCP/Specialist Visits Compliance with follow-up visit  Communication with RN  Nutrition Interventions   Nutrition Discussed/Reviewed Nutrition Discussed  Pharmacy Interventions   Pharmacy Dicussed/Reviewed Pharmacy Topics Discussed  [Full medication review with updating medication list in EHR per patient report]      Caryl Pina, RN, BSN, CCRN Alumnus RN CM Care Coordination/ Transition of Care- [facility] Care Management [phone]: direct office

## 2022-09-12 NOTE — Progress Notes (Signed)
  Care Coordination   Note   09/12/2022 Name: Zeruiah Hamidi MRN: [id number] DOB: [birth year]  Johnelle Tatsch is a 22 y.o. year old female who sees Elenore Paddy, NP for primary care. I reached out to Pincus Large by phone today to offer care coordination services.  Ms. Rembert was given information about Care Coordination services today including:   The Care Coordination services include support from the care team which includes your Nurse Coordinator, Clinical Social Worker, or Pharmacist.  The Care Coordination team is here to help remove barriers to the health concerns and goals most important to you. Care Coordination services are voluntary, and the patient may decline or stop services at any time by request to their care team member.   Care Coordination Consent Status: Patient agreed to services and verbal consent obtained.   Follow up plan:  Telephone appointment with care coordination team member scheduled for:  09/26/2022   Encounter Outcome:  Pt. Scheduled from referral   Burman Nieves, [facility] Care Coordination Care Guide Direct Dial: [phone]

## 2022-09-12 NOTE — Progress Notes (Signed)
  Care Coordination  Outreach Note  09/12/2022 Name: Judy Cook MRN: [id number] DOB: [birth year]   Care Coordination Outreach Attempts: A second unsuccessful outreach was attempted today to offer the patient with information about available care coordination services.  Follow Up Plan:  Additional outreach attempts will be made to offer the patient care coordination information and services.   Encounter Outcome:  No Answer  Burman Nieves, CCMA Care Coordination Care Guide Direct Dial: [phone]

## 2022-09-21 ENCOUNTER — Ambulatory Visit (INDEPENDENT_AMBULATORY_CARE_PROVIDER_SITE_OTHER): Payer: Federal, State, Local not specified - PPO | Admitting: Nurse Practitioner

## 2022-09-21 VITALS — BP 118/70 | HR 94 | Temp 98.0°F | Ht 60.0 in | Wt 141.5 lb

## 2022-09-21 DIAGNOSIS — K529 Noninfective gastroenteritis and colitis, unspecified: Secondary | ICD-10-CM | POA: Diagnosis not present

## 2022-09-21 DIAGNOSIS — E109 Type 1 diabetes mellitus without complications: Secondary | ICD-10-CM | POA: Diagnosis not present

## 2022-09-21 LAB — URINALYSIS, ROUTINE W REFLEX MICROSCOPIC
Bilirubin Urine: NEGATIVE
Hgb urine dipstick: NEGATIVE
Leukocytes,Ua: NEGATIVE
Nitrite: NEGATIVE
RBC / HPF: NONE SEEN (ref 0–?)
Specific Gravity, Urine: 1.025 (ref 1.000–1.030)
Total Protein, Urine: 100 — AB
Urine Glucose: 250 — AB
Urobilinogen, UA: 0.2 (ref 0.0–1.0)
pH: 6.5 (ref 5.0–8.0)

## 2022-09-21 LAB — POCT URINE PREGNANCY: Preg Test, Ur: NEGATIVE

## 2022-09-21 MED ORDER — METRONIDAZOLE 500 MG PO TABS
500.0000 mg | ORAL_TABLET | Freq: Three times a day (TID) | ORAL | 0 refills | Status: AC
Start: 2022-09-21 — End: 2022-09-26

## 2022-09-21 MED ORDER — ONDANSETRON HCL 4 MG PO TABS: 4.0000 mg | ORAL_TABLET | Freq: Three times a day (TID) | ORAL | 0 refills | Status: DC | PRN

## 2022-09-21 MED ORDER — CIPROFLOXACIN HCL 500 MG PO TABS
500.0000 mg | ORAL_TABLET | Freq: Two times a day (BID) | ORAL | 0 refills | Status: AC
Start: 2022-09-21 — End: 2022-10-01

## 2022-09-21 NOTE — Assessment & Plan Note (Signed)
Acute, improving Patient does still have some mild pain as well as notable chills intermittent nausea. Will check point-of-care pregnancy test today, if negative will proceed with additional week of ciprofloxacin and Flagyl. Patient was educated to avoid alcohol intake while taking medications as well as to avoid pregnancy.  Patient is on oral contraceptive pill which she reports that she is taking.  Will also check CBC, urinalysis, and urine culture to rule out other causes of abdominal pain.

## 2022-09-21 NOTE — Progress Notes (Signed)
Established Patient Office Visit  Subjective   Patient ID: Judy Cook, female    DOB: 06-18-2000  Age: 22 y.o. MRN: [id number]  Chief Complaint  Patient presents with   Abdominal Pain    Lower left abdominal pain about a month now     Patient lives for posthospital follow-up.  Patient admitted 09/04/2022 and discharged 09/08/2022.  She reports that she ran out of pump supplies and insulin and while waiting for insulin and pump supplies become available she started to feel unwell and proceeded to have evaluation emergency department.  She now has pump supplies and insulin again and reports that blood sugars are better since discharge.  Reports that she is seeing blood sugars between 100-180.  Still has intermittent nausea, but is much better than it was.  No vomiting.  CT scan in hospital also identified colitis.  She reports that she continues to have left lower quadrant pain, but pain is much improved.  She reports it was 7/10 on admission and is now 3/10.  She completed course of Keflex and Flagyl.  Is having daily bowel movements, they are somewhat loose but seem to be more formed compared to what they were when she was hospitalized.  She denies fevers but does report that she experiences chills.    ROS: see HPI    Objective:     BP 118/70   Pulse 94   Temp 98 F (36.7 C) (Temporal)   Ht 5' (1.524 m)   Wt 141 lb 8 oz (64.2 kg)   LMP 09/08/2022   SpO2 98%   BMI 27.63 kg/m    Physical Exam Vitals reviewed.  Constitutional:      General: She is not in acute distress.    Appearance: Normal appearance.  HENT:     Head: Normocephalic and atraumatic.  Neck:     Vascular: No carotid bruit.  Cardiovascular:     Rate and Rhythm: Normal rate and regular rhythm.     Pulses: Normal pulses.     Heart sounds: Normal heart sounds.  Pulmonary:     Effort: Pulmonary effort is normal.     Breath sounds: Normal breath sounds.  Abdominal:     General: Abdomen is flat.  Bowel sounds are normal. There is no distension.     Palpations: Abdomen is soft.     Tenderness: There is no abdominal tenderness.  Skin:    General: Skin is warm and dry.  Neurological:     General: No focal deficit present.     Mental Status: She is alert and oriented to person, place, and time.  Psychiatric:        Mood and Affect: Mood normal.        Behavior: Behavior normal.        Judgment: Judgment normal.      Results for orders placed or performed in visit on 09/21/22  POCT urine pregnancy  Result Value Ref Range   Preg Test, Ur Negative Negative      The ASCVD Risk score (Arnett DK, et al., 2019) failed to calculate for the following reasons:   The 2019 ASCVD risk score is only valid for ages 24 to 36    Assessment & Plan:   Problem List Items Addressed This Visit       Digestive   Colitis - Primary    Acute, improving Patient does still have some mild pain as well as notable chills intermittent nausea. Will check point-of-care pregnancy test  today, if negative will proceed with additional week of ciprofloxacin and Flagyl. Patient was educated to avoid alcohol intake while taking medications as well as to avoid pregnancy.  Patient is on oral contraceptive pill which she reports that she is taking.  Will also check CBC, urinalysis, and urine culture to rule out other causes of abdominal pain.      Relevant Medications   ciprofloxacin (CIPRO) 500 MG tablet   metroNIDAZOLE (FLAGYL) 500 MG tablet   ondansetron (ZOFRAN) 4 MG tablet   Other Relevant Orders   Basic metabolic panel   CBC   POCT urine pregnancy (Completed)   hCG, serum, qualitative   Urine Culture     Endocrine   Type 1 diabetes mellitus without complication (HCC)    Chronic, DKA resolved Patient continue taking insulin as prescribed.  Patient to follow-up with endocrinologist as scheduled.      Relevant Orders   Urinalysis, Routine w reflex microscopic   POCT urine pregnancy  (Completed)   hCG, serum, qualitative    Return for As scheduled or sooner as needed.    Elenore Paddy, NP

## 2022-09-21 NOTE — Assessment & Plan Note (Signed)
Chronic, DKA resolved Patient continue taking insulin as prescribed.  Patient to follow-up with endocrinologist as scheduled.

## 2022-09-22 ENCOUNTER — Other Ambulatory Visit: Payer: Self-pay | Admitting: Nurse Practitioner

## 2022-09-22 ENCOUNTER — Telehealth: Payer: Self-pay | Admitting: Nurse Practitioner

## 2022-09-22 DIAGNOSIS — D75839 Thrombocytosis, unspecified: Secondary | ICD-10-CM

## 2022-09-22 DIAGNOSIS — E871 Hypo-osmolality and hyponatremia: Secondary | ICD-10-CM

## 2022-09-22 DIAGNOSIS — K529 Noninfective gastroenteritis and colitis, unspecified: Secondary | ICD-10-CM | POA: Diagnosis not present

## 2022-09-22 DIAGNOSIS — R739 Hyperglycemia, unspecified: Secondary | ICD-10-CM

## 2022-09-22 DIAGNOSIS — E109 Type 1 diabetes mellitus without complications: Secondary | ICD-10-CM | POA: Diagnosis not present

## 2022-09-22 LAB — CBC
HCT: 40 % (ref 36.0–46.0)
Hemoglobin: 13 g/dL (ref 12.0–15.0)
MCHC: 32.5 g/dL (ref 30.0–36.0)
MCV: 86.3 fl (ref 78.0–100.0)
Platelets: 506 10*3/uL — ABNORMAL HIGH (ref 150.0–400.0)
RBC: 4.64 Mil/uL (ref 3.87–5.11)
RDW: 17.2 % — ABNORMAL HIGH (ref 11.5–15.5)
WBC: 5 10*3/uL (ref 4.0–10.5)

## 2022-09-22 LAB — BASIC METABOLIC PANEL
BUN: 8 mg/dL (ref 6–23)
CO2: 25 mEq/L (ref 19–32)
Calcium: 9.7 mg/dL (ref 8.4–10.5)
Chloride: 101 mEq/L (ref 96–112)
Creatinine, Ser: 0.93 mg/dL (ref 0.40–1.20)
GFR: 86.61 mL/min (ref >60.00)
Glucose, Bld: 146 mg/dL — ABNORMAL HIGH (ref 70–99)
Potassium: 4 mEq/L (ref 3.5–5.1)
Sodium: 134 mEq/L — ABNORMAL LOW (ref 135–145)

## 2022-09-22 LAB — URINE CULTURE

## 2022-09-22 NOTE — Telephone Encounter (Signed)
Please call patient and ask her to return to the lab. I do not see where her blood work was collected yesterday. Labs are active in the system so she can come to the lab today at her earliest convenience before 4:30pm and they will be able to draw what was ordered.

## 2022-09-22 NOTE — Telephone Encounter (Signed)
Made pt aware to come in today for blood works

## 2022-09-23 LAB — HCG, SERUM, QUALITATIVE: Preg, Serum: NEGATIVE

## 2022-09-25 ENCOUNTER — Encounter (HOSPITAL_BASED_OUTPATIENT_CLINIC_OR_DEPARTMENT_OTHER): Payer: Self-pay | Admitting: Obstetrics & Gynecology

## 2022-09-25 ENCOUNTER — Other Ambulatory Visit (HOSPITAL_BASED_OUTPATIENT_CLINIC_OR_DEPARTMENT_OTHER): Payer: Self-pay | Admitting: Obstetrics & Gynecology

## 2022-09-25 ENCOUNTER — Ambulatory Visit (INDEPENDENT_AMBULATORY_CARE_PROVIDER_SITE_OTHER): Payer: Federal, State, Local not specified - PPO | Admitting: Obstetrics & Gynecology

## 2022-09-25 VITALS — BP 133/95 | Ht 60.0 in | Wt 144.6 lb

## 2022-09-25 DIAGNOSIS — R030 Elevated blood-pressure reading, without diagnosis of hypertension: Secondary | ICD-10-CM

## 2022-09-25 DIAGNOSIS — Z3041 Encounter for surveillance of contraceptive pills: Secondary | ICD-10-CM

## 2022-09-25 MED ORDER — NORETHINDRONE 0.35 MG PO TABS
1.0000 | ORAL_TABLET | Freq: Every day | ORAL | 1 refills | Status: DC
Start: 2022-09-25 — End: 2022-11-09

## 2022-09-25 NOTE — Progress Notes (Signed)
GYNECOLOGY  VISIT  CC:   BP check after starting OCPs  HPI: 22 y.o. G1P0010 [name] here for blood pressure check after starting OCPs.  Denies headache or other symptoms but BP is elevated with DPB 92 and 95 today.  Advised has increased risks of stroke with this and being on combination OCPs.  Advised to stop and will need to transition to another form of contraception.  POPs, Depo provera, Nexplanon, IUD all discussed.  Pt feels comfortable with pills so will switch to POPs.  Advised to transition after this pack is finished.  Importance of taking at the same time daily and using back up method if misses pill for next 7 days.  Advised possibility of irregular bleeding as well.  Questions answered.   Past Medical History:  Diagnosis Date   Allergic rhinitis 07/07/2021   COVID 2020   mild   Diabetes mellitus (HCC)    Type 1, type 5   Dysmenorrhea 02/20/2019   GERD (gastroesophageal reflux disease)    Headache    History of herpes genitalis 06/24/2021   Intractable vomiting    SIRS (systemic inflammatory response syndrome) (HCC) 12/03/2019    MEDS:   Current Outpatient Medications on File Prior to Visit  Medication Sig Dispense Refill   ciprofloxacin (CIPRO) 500 MG tablet Take 1 tablet (500 mg total) by mouth 2 (two) times daily for 10 days. 20 tablet 0   insulin aspart (NOVOLOG) 100 UNIT/ML injection Max Daily 60 units per pump 60 mL 11   metroNIDAZOLE (FLAGYL) 500 MG tablet Take 1 tablet (500 mg total) by mouth 3 (three) times daily for 5 days. 15 tablet 0   ondansetron (ZOFRAN) 4 MG tablet Take 1 tablet (4 mg total) by mouth every 8 (eight) hours as needed for nausea or vomiting. 10 tablet 0   No current facility-administered medications on file prior to visit.    ALLERGIES: Patient has no known allergies.  SH:  single, non smoker  Review of Systems  Constitutional: Negative.   Genitourinary: Negative.     PHYSICAL EXAMINATION:    BP (!)  133/95   Ht 5' (1.524 m)   Wt 144 lb 9.6 oz (65.6 kg)   LMP 09/08/2022   BMI 28.24 kg/m     Physical Exam Constitutional:      Appearance: Normal appearance.  Cardiovascular:     Rate and Rhythm: Normal rate.  Pulmonary:     Effort: Pulmonary effort is normal.  Neurological:     General: No focal deficit present.     Mental Status: She is alert.  Psychiatric:        Mood and Affect: Mood normal.     Assessment/Plan: 1. Encounter for surveillance of contraceptive pills - will switch to micronor.  Rx to pharmacy for 90 day supply.  Instructions for use given.  2. Elevated blood pressure reading - will recheck BP in 3 weeks.  If still elevated will refer to PCP for management.

## 2022-09-26 ENCOUNTER — Ambulatory Visit: Payer: Self-pay

## 2022-09-26 NOTE — Patient Outreach (Signed)
  Care Coordination   Initial Visit Note   09/26/2022 Name: Judy Cook MRN: [id number] DOB: [birth year]  Judy Cook is a 22 y.o. year old female who sees Elenore Paddy, NP for primary care. I spoke with  Pincus Large by phone today.  What matters to the patients health and wellness today? Admitted 09/04/22-09/08/22 for DKA.  Patient reports she is doing better and starting to feel better. She states she has her insulin and her blood sugars are improved adding this morning BS 120-130. She expressed that she ran out of medication and tried to wait until her pharmacy got it in. However, she states she is doing what she needs to do to manage her blood sugars. She has followed up with primary care provider. Appointment with endocrinologist scheduled for 12/07/2022. She is without questions or concerns at this time.  Goals Addressed             This Visit's Progress    continue to  improve post hospitalization       Interventions Today    Flowsheet Row Most Recent Value  Chronic Disease   Chronic disease during today's visit Diabetes  General Interventions   General Interventions Discussed/Reviewed General Interventions Discussed  Doctor Visits Discussed/Reviewed Doctor Visits Discussed  PCP/Specialist Visits Compliance with follow-up visit  Education Interventions   Education Provided Provided Education  Provided Verbal Education On Blood Sugar Monitoring, When to see the doctor  [advised to contact endocrinologist to notify of hospitalization to see if sooner appointment needed-patient states she will call. encouraged to contact endocrinologist if she has difficulty obtaining meds in the future.]  Pharmacy Interventions   Pharmacy Dicussed/Reviewed Pharmacy Topics Discussed, Medication Adherence  [problem solve with patient what she could do if problem getting medication in the future: contact endocrinologist, see if her pharmcy in another location has the  medication in stock. Reiterated the importance of taking her insulin.]            SDOH assessments and interventions completed:  No  Care Coordination Interventions:  Yes, provided   Follow up plan: Follow up call scheduled for 10/10/22    Encounter Outcome:  Pt. Visit Completed   Kathyrn Sheriff, RN, MSN, BSN, CCM [facility] Care Coordinator [phone]

## 2022-09-26 NOTE — Patient Instructions (Signed)
Visit Information  Thank you for taking time to visit with me today. Please don't hesitate to contact me if I can be of assistance to you.   Following are the goals we discussed today:  contact endocrinologist to notify of hospitalization to see if sooner appointment needed contact endocrinologist or RN Care Coordinator if difficulty obtaining meds in the future Continue to check blood sugar and contact provider if outside of recommended range Continue to eat Healthy: avoid concentrated sweets, saturated fats and transfats Continue to attend provider visits as scheduled Continue to take medications as recommended Contact RN Care Coordinator if care coordination needs.   Our next appointment is by telephone on 10/10/22 at 1:00 pm  Please call the care guide team at [phone] if you need to cancel or reschedule your appointment.   If you are experiencing a Mental Health or Behavioral Health Crisis or need someone to talk to, please call the Suicide and Crisis Lifeline: 38   Kathyrn Sheriff, RN, MSN, BSN, CCM [facility] Care Coordinator [phone]

## 2022-09-27 ENCOUNTER — Telehealth: Payer: Self-pay | Admitting: Hematology and Oncology

## 2022-09-27 NOTE — Telephone Encounter (Signed)
scheduled per referral, pt has been called and confirmed date and time. Pt is aware of location and to arrive early for check in   

## 2022-10-10 ENCOUNTER — Ambulatory Visit: Payer: Self-pay

## 2022-10-10 NOTE — Patient Outreach (Signed)
  Care Coordination   Follow Up Visit Note   10/10/2022 Name: Tabithia Oguinn MRN: [id number] DOB: 03-Jan-2001  Yvonnia Valiant is a 22 y.o. year old female who sees Elenore Paddy, NP for primary care. I spoke with  Pincus Large by phone today.  What matters to the patients health and wellness today?  RNCM spoke with patient who reports this is not a good time to talk and request RNCM call    SDOH assessments and interventions completed:  No  Care Coordination Interventions:  No, not indicated   Follow up plan:  rescheduled for tomorrow    Encounter Outcome:  Pt. Visit Completed   Kathyrn Sheriff, RN, MSN, BSN, CCM [facility] Care Coordinator [phone]

## 2022-10-11 ENCOUNTER — Ambulatory Visit: Payer: Self-pay

## 2022-10-11 DIAGNOSIS — E109 Type 1 diabetes mellitus without complications: Secondary | ICD-10-CM

## 2022-10-11 NOTE — Patient Instructions (Addendum)
Visit Information  Thank you for taking time to visit with me today. Please don't hesitate to contact me if I can be of assistance to you.   Following are the goals we discussed today:  Continue to take medications as prescribed Continue to attend provider visits as scheduled Contact your Endocrinologist regarding inability to afford dexcom sensor; and schedule visit with nutritionist Continue to check blood sugars as prescribed and notify provider if outside recommended range I have send a referral to our clinical pharmacist re: assist with dexcom sensor assistance; I have sent a referral to the care guide regarding food resources; I have sent a referral to licensed clinical social worker for community resource needs/financial strain.  Our next appointment is by telephone on 10/19/22 at 2:00 pm  Please call the care guide team at [phone] if you need to cancel or reschedule your appointment.   If you are experiencing a Mental Health or Behavioral Health Crisis or need someone to talk to, please call the Suicide and Crisis Lifeline: 20  Kathyrn Sheriff, RN, MSN, BSN, CCM [facility] Care Coordinator [phone]

## 2022-10-11 NOTE — Patient Outreach (Signed)
  Care Coordination   Follow Up Visit Note   10/11/2022 Name: Judy Cook MRN: [id number] DOB: [birth year]  Judy Cook is a 22 y.o. year old female who sees Elenore Paddy, NP for primary care. I spoke with  Pincus Large by phone today.  What matters to the patients health and wellness today?  Reports has T-slim insulin pump. Dexcom meter, but does not have any more sensors, reports cost is around $490. Currently she is checking blood sugar with manual finger sticks and reports blood sugar fasting this morning 200. She states acknowledges that she needs to do a better job with eating and modifying her carbohydrates. Patient to call her endocrinologist to schedule a visit with the nutritionist; and inform them of her difficulty with obtaining sensors due to cost.   Goals Addressed             This Visit's Progress    continue to  improve post hospitalization       Interventions Today    Flowsheet Row Most Recent Value  Chronic Disease   Chronic disease during today's visit Diabetes  General Interventions   General Interventions Discussed/Reviewed General Interventions Reviewed, Communication with  Doctor Visits Discussed/Reviewed Doctor Visits Discussed, Specialist  PCP/Specialist Visits Compliance with follow-up visit  [facility] assess need for patient to see nutritionist and encouraged patient to call to schedule a visit]  Communication with Social Work  [financial strain, community resources]  Education Interventions   Education Provided Provided Education  Provided Verbal Education On --  [advised to continue to check and record blood sugars, advised to monitor carbohydrates, eat carb modified diet, discussed how nutiritionist can assist with helping patient with food choices and encouraged to schedule an appointment with nutritionist.]  Nutrition Interventions   Nutrition Discussed/Reviewed Nutrition Discussed  Pharmacy Interventions   Pharmacy  Dicussed/Reviewed Pharmacy Topics Discussed, Referral to Pharmacist, Affording Medications  Referral to Pharmacist Cannot afford medications  [unable to afford dexcom sensors]            SDOH assessments and interventions completed:  Yes  SDOH Interventions Today    Flowsheet Row Most Recent Value  SDOH Interventions   Food Insecurity Interventions Other (Comment)  [referral to care guide]  Housing Interventions Intervention Not Indicated  Transportation Interventions Intervention Not Indicated  Utilities Interventions Intervention Not Indicated  Financial Strain Interventions Intervention Not Indicated     Care Coordination Interventions:  Yes, provided   Follow up plan: Follow up call scheduled for 10/19/22    Encounter Outcome:  Pt. Visit Completed   Kathyrn Sheriff, RN, MSN, BSN, CCM [facility] Care Coordinator [phone]

## 2022-10-13 ENCOUNTER — Other Ambulatory Visit: Payer: Self-pay

## 2022-10-13 ENCOUNTER — Encounter (HOSPITAL_COMMUNITY): Payer: Self-pay

## 2022-10-13 ENCOUNTER — Telehealth: Payer: Self-pay

## 2022-10-13 ENCOUNTER — Telehealth: Payer: Self-pay | Admitting: *Deleted

## 2022-10-13 ENCOUNTER — Emergency Department (HOSPITAL_COMMUNITY)
Admission: EM | Admit: 2022-10-13 | Discharge: 2022-10-13 | Disposition: A | Discharge status: Home / Self Care | Payer: Federal, State, Local not specified - PPO | Source: Home / Self Care | Attending: Emergency Medicine | Admitting: Emergency Medicine

## 2022-10-13 DIAGNOSIS — F10129 Alcohol abuse with intoxication, unspecified: Secondary | ICD-10-CM | POA: Diagnosis not present

## 2022-10-13 DIAGNOSIS — K920 Hematemesis: Secondary | ICD-10-CM | POA: Diagnosis not present

## 2022-10-13 DIAGNOSIS — F10920 Alcohol use, unspecified with intoxication, uncomplicated: Secondary | ICD-10-CM | POA: Insufficient documentation

## 2022-10-13 DIAGNOSIS — Z833 Family history of diabetes mellitus: Secondary | ICD-10-CM | POA: Diagnosis not present

## 2022-10-13 DIAGNOSIS — E669 Obesity, unspecified: Secondary | ICD-10-CM | POA: Insufficient documentation

## 2022-10-13 DIAGNOSIS — K219 Gastro-esophageal reflux disease without esophagitis: Secondary | ICD-10-CM | POA: Diagnosis not present

## 2022-10-13 DIAGNOSIS — F17210 Nicotine dependence, cigarettes, uncomplicated: Secondary | ICD-10-CM | POA: Diagnosis not present

## 2022-10-13 DIAGNOSIS — F10120 Alcohol abuse with intoxication, uncomplicated: Secondary | ICD-10-CM | POA: Diagnosis not present

## 2022-10-13 DIAGNOSIS — R9431 Abnormal electrocardiogram [ECG] [EKG]: Secondary | ICD-10-CM | POA: Diagnosis not present

## 2022-10-13 DIAGNOSIS — E876 Hypokalemia: Secondary | ICD-10-CM | POA: Diagnosis not present

## 2022-10-13 DIAGNOSIS — E109 Type 1 diabetes mellitus without complications: Secondary | ICD-10-CM | POA: Insufficient documentation

## 2022-10-13 DIAGNOSIS — R110 Nausea: Secondary | ICD-10-CM | POA: Diagnosis not present

## 2022-10-13 DIAGNOSIS — Z9641 Presence of insulin pump (external) (internal): Secondary | ICD-10-CM | POA: Diagnosis not present

## 2022-10-13 DIAGNOSIS — Z794 Long term (current) use of insulin: Secondary | ICD-10-CM | POA: Insufficient documentation

## 2022-10-13 DIAGNOSIS — R1111 Vomiting without nausea: Secondary | ICD-10-CM | POA: Diagnosis not present

## 2022-10-13 DIAGNOSIS — E1010 Type 1 diabetes mellitus with ketoacidosis without coma: Secondary | ICD-10-CM | POA: Diagnosis not present

## 2022-10-13 DIAGNOSIS — Z6828 Body mass index (BMI) 28.0-28.9, adult: Secondary | ICD-10-CM | POA: Insufficient documentation

## 2022-10-13 DIAGNOSIS — Z79899 Other long term (current) drug therapy: Secondary | ICD-10-CM | POA: Diagnosis not present

## 2022-10-13 DIAGNOSIS — J309 Allergic rhinitis, unspecified: Secondary | ICD-10-CM | POA: Diagnosis not present

## 2022-10-13 DIAGNOSIS — Z8616 Personal history of COVID-19: Secondary | ICD-10-CM | POA: Diagnosis not present

## 2022-10-13 DIAGNOSIS — E871 Hypo-osmolality and hyponatremia: Secondary | ICD-10-CM | POA: Diagnosis not present

## 2022-10-13 LAB — CBC
HCT: 41.9 % (ref 36.0–46.0)
Hemoglobin: 13.7 g/dL (ref 12.0–15.0)
MCH: 28.1 pg (ref 26.0–34.0)
MCHC: 32.7 g/dL (ref 30.0–36.0)
MCV: 85.9 fL (ref 80.0–100.0)
Platelets: 398 10*3/uL (ref 150–400)
RBC: 4.88 MIL/uL (ref 3.87–5.11)
RDW: 16.8 % — ABNORMAL HIGH (ref 11.5–15.5)
WBC: 6.8 10*3/uL (ref 4.0–10.5)
nRBC: 0 % (ref 0.0–0.2)

## 2022-10-13 LAB — COMPREHENSIVE METABOLIC PANEL
ALT: 21 U/L (ref 0–44)
AST: 26 U/L (ref 15–41)
Albumin: 4.3 g/dL (ref 3.5–5.0)
Alkaline Phosphatase: 68 U/L (ref 38–126)
Anion gap: 12 (ref 5–15)
BUN: 8 mg/dL (ref 6–20)
CO2: 21 mmol/L — ABNORMAL LOW (ref 22–32)
Calcium: 9 mg/dL (ref 8.9–10.3)
Chloride: 105 mmol/L (ref 98–111)
Creatinine, Ser: 0.79 mg/dL (ref 0.44–1.00)
GFR, Estimated: >60 mL/min (ref >60)
Glucose, Bld: 171 mg/dL — ABNORMAL HIGH (ref 70–99)
Potassium: 3.4 mmol/L — ABNORMAL LOW (ref 3.5–5.1)
Sodium: 138 mmol/L (ref 135–145)
Total Bilirubin: 0.7 mg/dL (ref 0.3–1.2)
Total Protein: 8 g/dL (ref 6.5–8.1)

## 2022-10-13 LAB — BLOOD GAS, VENOUS
Acid-base deficit: 1.3 mmol/L (ref 0.0–2.0)
Bicarbonate: 22.7 mmol/L (ref 20.0–28.0)
O2 Saturation: 81.6 %
Patient temperature: 37
pCO2, Ven: 35 mmHg — ABNORMAL LOW (ref 44–60)
pH, Ven: 7.42 (ref 7.25–7.43)
pO2, Ven: 47 mmHg — ABNORMAL HIGH (ref 32–45)

## 2022-10-13 LAB — URINALYSIS, ROUTINE W REFLEX MICROSCOPIC
Bilirubin Urine: NEGATIVE
Glucose, UA: 50 mg/dL — AB
Hgb urine dipstick: NEGATIVE
Ketones, ur: NEGATIVE mg/dL
Leukocytes,Ua: NEGATIVE
Nitrite: NEGATIVE
Protein, ur: 100 mg/dL — AB
Specific Gravity, Urine: 1.013 (ref 1.005–1.030)
pH: 9 — ABNORMAL HIGH (ref 5.0–8.0)

## 2022-10-13 LAB — I-STAT BETA HCG BLOOD, ED (MC, WL, AP ONLY): I-stat hCG, quantitative: <5 m[IU]/mL (ref <5)

## 2022-10-13 LAB — LIPASE, BLOOD: Lipase: 25 U/L (ref 11–51)

## 2022-10-13 LAB — CBG MONITORING, ED: Glucose-Capillary: 150 mg/dL — ABNORMAL HIGH (ref 70–99)

## 2022-10-13 MED ORDER — ONDANSETRON HCL 4 MG PO TABS: 4.0000 mg | ORAL_TABLET | Freq: Four times a day (QID) | ORAL | 0 refills | Status: DC

## 2022-10-13 MED ORDER — METOCLOPRAMIDE HCL 5 MG/ML IJ SOLN
10.0000 mg | Freq: Once | INTRAMUSCULAR | Status: AC
  Administered 2022-10-13: 10 mg via INTRAVENOUS
  Filled 2022-10-13: qty 2

## 2022-10-13 MED ORDER — ONDANSETRON HCL 4 MG/2ML IJ SOLN
4.0000 mg | Freq: Once | INTRAMUSCULAR | Status: AC
  Administered 2022-10-13: 4 mg via INTRAVENOUS
  Filled 2022-10-13: qty 2

## 2022-10-13 MED ORDER — KETOROLAC TROMETHAMINE 30 MG/ML IJ SOLN
15.0000 mg | Freq: Once | INTRAMUSCULAR | Status: AC
  Administered 2022-10-13: 15 mg via INTRAVENOUS
  Filled 2022-10-13: qty 1

## 2022-10-13 MED ORDER — SODIUM CHLORIDE 0.9 % IV BOLUS
1000.0000 mL | Freq: Once | INTRAVENOUS | Status: AC
  Administered 2022-10-13: 1000 mL via INTRAVENOUS

## 2022-10-13 NOTE — Telephone Encounter (Signed)
   Telephone encounter was:  Unsuccessful.  10/13/2022 Name: Judy Cook MRN: [id number] DOB: 2000/05/15  Unsuccessful outbound call made today to assist with:  Food Insecurity  Outreach Attempt:  1st Attempt  No answer  Alois Cliche -[facility] [place], Population Health [phone] [address] [place] , [place] [address] Email : [email or web address]

## 2022-10-13 NOTE — ED Triage Notes (Signed)
Pt bib EMS from home for vomiting and ETOH intoxication. Pt states she drunk multiple shots lastnight and has been vomiting since. BG 180

## 2022-10-13 NOTE — Progress Notes (Unsigned)
   Care Guide Note  10/13/2022 Name: Judy Cook MRN: [id number] DOB: 22-Feb-2001  Referred by: Elenore Paddy, NP Reason for referral : Care Coordination (Outreach to schedule referral with Pharm d )   Judy Cook is a 22 y.o. year old female who is a primary care patient of Elenore Paddy, NP. Judy Cook was referred to the pharmacist for assistance related to DM.    Cook unsuccessful telephone outreach was attempted today to contact the patient who was referred to the pharmacy team for assistance with medication management. Additional attempts will be made to contact the patient.   Penne Lash, RMA Care Guide [facility]  [place], [place] [address] Direct Dial: [phone] [email or web address]

## 2022-10-13 NOTE — ED Provider Notes (Signed)
[place] EMERGENCY DEPARTMENT AT [facility] Provider Note   CSN: [id number] Arrival date & time: 10/13/22  4098     History  Chief Complaint  Patient presents with   Emesis   Alcohol Intoxication    Pt bib EMS from home for vomiting and ETOH intoxication. Pt states she drunk multiple shots lastnight and has been vomiting since. BG 180    Judy Cook is a 22 y.o. female. With past medical history of type 1 diabetes, cyclical vomiting who presents to the emergency department with alcohol intoxication.   States overnight she was drinking alcohol.  States that she has been drinking tequila.  She cannot quantify how much she has been ingesting.  States that earlier this morning began having abdominal pain, nausea and vomiting.  She describes the pain as crampy and constant.  It is nonradiating.  She denies having any urinary symptoms, vaginal discharge, diarrhea.  She denies having any possibly contaminated foods.  She does have type 1 diabetes and states that in May she was admitted for DKA.  Is currently wearing her insulin pump.    Emesis Associated symptoms: abdominal pain   Associated symptoms: no diarrhea   Alcohol Intoxication Associated symptoms include abdominal pain.       Home Medications Prior to Admission medications   Medication Sig Start Date End Date Taking? Authorizing Provider  ondansetron (ZOFRAN) 4 MG tablet Take 1 tablet (4 mg total) by mouth every 6 (six) hours. 10/13/22  Yes Cristopher Peru, PA-C  insulin aspart (NOVOLOG) 100 UNIT/ML injection Max Daily 60 units per pump 06/08/22   Shamleffer, Konrad Dolores, MD  norethindrone (MICRONOR) 0.35 MG tablet Take 1 tablet (0.35 mg total) by mouth daily. 09/25/22   Jerene Bears, MD      Allergies    Patient has no known allergies.    Review of Systems   Review of Systems  Gastrointestinal:  Positive for abdominal pain, nausea and vomiting. Negative for diarrhea.  All other systems  reviewed and are negative.   Physical Exam Updated Vital Signs BP (!) 134/91 (BP Location: Right Arm)   Pulse 71   Temp 98.1 F (36.7 C) (Oral)   Resp 17   Ht 5' (1.524 m)   Wt 65.6 kg   LMP 09/08/2022   SpO2 100%   BMI 28.24 kg/m  Physical Exam Vitals and nursing note reviewed.  Constitutional:      General: She is not in acute distress.    Appearance: Normal appearance. She is obese. She is ill-appearing. She is not toxic-appearing.  HENT:     Head: Normocephalic.     Mouth/Throat:     Mouth: Mucous membranes are moist.     Pharynx: Oropharynx is clear.  Eyes:     General: No scleral icterus.    Extraocular Movements: Extraocular movements intact.  Cardiovascular:     Rate and Rhythm: Normal rate and regular rhythm.     Pulses: Normal pulses.     Heart sounds: Normal heart sounds. No murmur heard. Pulmonary:     Effort: Pulmonary effort is normal.     Breath sounds: Normal breath sounds.  Abdominal:     General: Abdomen is protuberant. Bowel sounds are normal. There is no distension.     Palpations: Abdomen is soft.     Tenderness: There is generalized abdominal tenderness. There is no guarding or rebound.  Skin:    General: Skin is warm and dry.     Capillary  Refill: Capillary refill takes less than 2 seconds.  Neurological:     General: No focal deficit present.     Mental Status: She is alert and oriented to person, place, and time. Mental status is at baseline.  Psychiatric:        Mood and Affect: Mood normal.        Behavior: Behavior normal.        Thought Content: Thought content normal.        Judgment: Judgment normal.     ED Results / Procedures / Treatments   Labs (all labs ordered are listed, but only abnormal results are displayed) Labs Reviewed  CBC - Abnormal; Notable for the following components:      Result Value   RDW 16.8 (*)    All other components within normal limits  COMPREHENSIVE METABOLIC PANEL - Abnormal; Notable for the  following components:   Potassium 3.4 (*)    CO2 21 (*)    Glucose, Bld 171 (*)    All other components within normal limits  BLOOD GAS, VENOUS - Abnormal; Notable for the following components:   pCO2, Ven 35 (*)    pO2, Ven 47 (*)    All other components within normal limits  URINALYSIS, ROUTINE W REFLEX MICROSCOPIC - Abnormal; Notable for the following components:   Color, Urine STRAW (*)    pH 9.0 (*)    Glucose, UA 50 (*)    Protein, ur 100 (*)    Bacteria, UA RARE (*)    All other components within normal limits  CBG MONITORING, ED - Abnormal; Notable for the following components:   Glucose-Capillary 150 (*)    All other components within normal limits  LIPASE, BLOOD  PREGNANCY, URINE  I-STAT BETA HCG BLOOD, ED ([facility], [facility], [facility] ONLY)    EKG EKG Interpretation  Date/Time:  Friday October 13 2022 06:46:02 EDT Ventricular Rate:  66 PR Interval:  160 QRS Duration: 74 QT Interval:  412 QTC Calculation: 432 R Axis:   75 Text Interpretation: Sinus rhythm Confirmed by Alona Bene [phone]) on 10/13/2022 6:52:12 AM  Radiology No results found.  Procedures Procedures   Medications Ordered in ED Medications  ondansetron (ZOFRAN) injection 4 mg (4 mg Intravenous Given 10/13/22 0647)  sodium chloride 0.9 % bolus 1,000 mL (0 mLs Intravenous Stopped 10/13/22 0847)  ketorolac (TORADOL) 30 MG/ML injection 15 mg (15 mg Intravenous Given 10/13/22 0804)  metoCLOPramide (REGLAN) injection 10 mg (10 mg Intravenous Given 10/13/22 0804)    ED Course/ Medical Decision Making/ A&P    Medical Decision Making Amount and/or Complexity of Data Reviewed Labs: ordered.  Risk Prescription drug management.  Initial Impression and Ddx 22 year old female who presents to the emergency department with abdominal pain, nausea and vomiting Patient PMH that increases complexity of ED encounter: Type 1 diabetes Differential: Acute hepatobiliary disease, pancreatitis, appendicitis, PUD, gastritis, SBO,  diverticulitis, colitis, viral gastroenteritis, Crohn's, UC, vascular catastrophe, UTI, pyelonephritis, renal stone, obstructed stone, infected stone, ovarian torsion, ectopic pregnancy, TOA, PID, STD, etc.   Interpretation of Diagnostics I independent reviewed and interpreted the labs as followed: CBC without leukocytosis, glucose is 150, no anion gap, venous gas without acidosis.  UA without ketonuria, not pregnant  - I independently visualized the following imaging with scope of interpretation limited to determining acute life threatening conditions related to emergency care: Not indicated  Patient Reassessment and Ultimate Disposition/Management 22 year old female who presents to the emergency department with abdominal pain, nausea and vomiting after alcohol  use overnight.  She is well-appearing, currently nauseated and dry heaving.  She has abdominal tenderness on exam which is generalized.  Her abdomen is soft with no peritonitic findings.  Hemodynamically stable and afebrile. This may all just be alcohol intoxication but will obtain a VBG given her type 1 diabetes.  Giving IV fluids and Zofran and will reassess.  62: Continuing to have some abdominal pain and nausea.  Will give Toradol and Reglan.  VBG does not show any evidence of acidosis.  Patient with improvement in her nausea.  She is stabilized here in the ED.  She has ambulated multiple times to the bathroom without difficulty or assistance.  Continues to feel uneasy on her stomach.  There is no evidence of DKA on her lab work.  Clinically has metabolized.  Will discharge home with Zofran.  I instructed her to drink plenty of fluids and rest.  She is agreeable to this plan.  To return if she has significantly worsening abdominal pain but likely think all her symptoms are related to her alcohol intoxication overnight.  I have a low suspicion that she has any intra-abdominal emergencies at this time.  The patient has been appropriately  medically screened and/or stabilized in the ED. I have low suspicion for any other emergent medical condition which would require further screening, evaluation or treatment in the ED or require inpatient management. At time of discharge the patient is hemodynamically stable and in no acute distress. I have discussed work-up results and diagnosis with patient and answered all questions. Patient is agreeable with discharge plan. We discussed strict return precautions for returning to the emergency department and they verbalized understanding.     Patient management required discussion with the following services or consulting groups:  None  Complexity of Problems Addressed Acute complicated illness or Injury  Additional Data Reviewed and Analyzed Further history obtained from: EMS on arrival, Past medical history and medications listed in the EMR, Prior ED visit notes, and Care Everywhere  Patient Encounter Risk Assessment Prescriptions and SDOH impact on management  Final Clinical Impression(s) / ED Diagnoses Final diagnoses:  Alcoholic intoxication without complication (HCC)    Rx / DC Orders ED Discharge Orders          Ordered    ondansetron (ZOFRAN) 4 MG tablet  Every 6 hours        10/13/22 0944              Cristopher Peru, PA-C 10/13/22 1227    Alvira Monday, MD 10/14/22 [phone]

## 2022-10-13 NOTE — Discharge Instructions (Signed)
You were seen in the emergency department today for alcohol intoxication.  Your labs are reassuring.  Please drink plenty of fluids and be sure to ingest food today.  You may use Tylenol or Motrin for abdominal pain.  I prescribed you Zofran for nausea and vomiting.  Your diabetes labs look normal.  Please return to emergency department if you have persistent, worsening abdominal pain or fever.

## 2022-10-14 ENCOUNTER — Inpatient Hospital Stay (HOSPITAL_COMMUNITY)
Admission: EM | Admit: 2022-10-14 | Discharge: 2022-10-16 | DRG: 638 | Disposition: A | Discharge status: Home / Self Care | Payer: Federal, State, Local not specified - PPO | Attending: Internal Medicine | Admitting: Internal Medicine

## 2022-10-14 ENCOUNTER — Other Ambulatory Visit: Payer: Self-pay

## 2022-10-14 DIAGNOSIS — R112 Nausea with vomiting, unspecified: Secondary | ICD-10-CM | POA: Diagnosis not present

## 2022-10-14 DIAGNOSIS — Z8616 Personal history of COVID-19: Secondary | ICD-10-CM

## 2022-10-14 DIAGNOSIS — R9431 Abnormal electrocardiogram [ECG] [EKG]: Secondary | ICD-10-CM | POA: Diagnosis not present

## 2022-10-14 DIAGNOSIS — R1084 Generalized abdominal pain: Secondary | ICD-10-CM | POA: Diagnosis not present

## 2022-10-14 DIAGNOSIS — E1010 Type 1 diabetes mellitus with ketoacidosis without coma: Principal | ICD-10-CM | POA: Diagnosis present

## 2022-10-14 DIAGNOSIS — D72829 Elevated white blood cell count, unspecified: Secondary | ICD-10-CM | POA: Diagnosis present

## 2022-10-14 DIAGNOSIS — Z811 Family history of alcohol abuse and dependence: Secondary | ICD-10-CM

## 2022-10-14 DIAGNOSIS — E876 Hypokalemia: Secondary | ICD-10-CM | POA: Diagnosis present

## 2022-10-14 DIAGNOSIS — F17210 Nicotine dependence, cigarettes, uncomplicated: Secondary | ICD-10-CM | POA: Diagnosis present

## 2022-10-14 DIAGNOSIS — J309 Allergic rhinitis, unspecified: Secondary | ICD-10-CM | POA: Diagnosis present

## 2022-10-14 DIAGNOSIS — R739 Hyperglycemia, unspecified: Secondary | ICD-10-CM | POA: Diagnosis not present

## 2022-10-14 DIAGNOSIS — Z79899 Other long term (current) drug therapy: Secondary | ICD-10-CM

## 2022-10-14 DIAGNOSIS — K920 Hematemesis: Secondary | ICD-10-CM | POA: Diagnosis present

## 2022-10-14 DIAGNOSIS — R0689 Other abnormalities of breathing: Secondary | ICD-10-CM | POA: Diagnosis not present

## 2022-10-14 DIAGNOSIS — Z794 Long term (current) use of insulin: Secondary | ICD-10-CM

## 2022-10-14 DIAGNOSIS — E871 Hypo-osmolality and hyponatremia: Secondary | ICD-10-CM | POA: Diagnosis present

## 2022-10-14 DIAGNOSIS — Z833 Family history of diabetes mellitus: Secondary | ICD-10-CM

## 2022-10-14 DIAGNOSIS — Z9641 Presence of insulin pump (external) (internal): Secondary | ICD-10-CM | POA: Diagnosis present

## 2022-10-14 DIAGNOSIS — R1032 Left lower quadrant pain: Secondary | ICD-10-CM | POA: Diagnosis present

## 2022-10-14 DIAGNOSIS — K219 Gastro-esophageal reflux disease without esophagitis: Secondary | ICD-10-CM | POA: Diagnosis present

## 2022-10-14 DIAGNOSIS — F10120 Alcohol abuse with intoxication, uncomplicated: Secondary | ICD-10-CM | POA: Diagnosis present

## 2022-10-14 LAB — BETA-HYDROXYBUTYRIC ACID
Beta-Hydroxybutyric Acid: 4.38 mmol/L — ABNORMAL HIGH (ref 0.05–0.27)
Beta-Hydroxybutyric Acid: 1.93 mmol/L — ABNORMAL HIGH (ref 0.05–0.27)
Beta-Hydroxybutyric Acid: 0.91 mmol/L — ABNORMAL HIGH (ref 0.05–0.27)

## 2022-10-14 LAB — CBC
HCT: 39.9 % (ref 36.0–46.0)
Hemoglobin: 13.7 g/dL (ref 12.0–15.0)
MCH: 28.2 pg (ref 26.0–34.0)
MCHC: 34.3 g/dL (ref 30.0–36.0)
MCV: 82.1 fL (ref 80.0–100.0)
Platelets: 431 10*3/uL — ABNORMAL HIGH (ref 150–400)
RBC: 4.86 MIL/uL (ref 3.87–5.11)
RDW: 17.2 % — ABNORMAL HIGH (ref 11.5–15.5)
WBC: 13.4 10*3/uL — ABNORMAL HIGH (ref 4.0–10.5)
nRBC: 0 % (ref 0.0–0.2)

## 2022-10-14 LAB — BASIC METABOLIC PANEL
Anion gap: 18 — ABNORMAL HIGH (ref 5–15)
Anion gap: 16 — ABNORMAL HIGH (ref 5–15)
Anion gap: 11 (ref 5–15)
Anion gap: 11 (ref 5–15)
BUN: 15 mg/dL (ref 6–20)
BUN: 16 mg/dL (ref 6–20)
BUN: 12 mg/dL (ref 6–20)
BUN: 11 mg/dL (ref 6–20)
CO2: 20 mmol/L — ABNORMAL LOW (ref 22–32)
CO2: 17 mmol/L — ABNORMAL LOW (ref 22–32)
CO2: 18 mmol/L — ABNORMAL LOW (ref 22–32)
CO2: 17 mmol/L — ABNORMAL LOW (ref 22–32)
Calcium: 8.7 mg/dL — ABNORMAL LOW (ref 8.9–10.3)
Calcium: 8.5 mg/dL — ABNORMAL LOW (ref 8.9–10.3)
Calcium: 8.5 mg/dL — ABNORMAL LOW (ref 8.9–10.3)
Calcium: 8.4 mg/dL — ABNORMAL LOW (ref 8.9–10.3)
Chloride: 94 mmol/L — ABNORMAL LOW (ref 98–111)
Chloride: 102 mmol/L (ref 98–111)
Chloride: 105 mmol/L (ref 98–111)
Chloride: 105 mmol/L (ref 98–111)
Creatinine, Ser: 1.12 mg/dL — ABNORMAL HIGH (ref 0.44–1.00)
Creatinine, Ser: 1.06 mg/dL — ABNORMAL HIGH (ref 0.44–1.00)
Creatinine, Ser: 0.9 mg/dL (ref 0.44–1.00)
Creatinine, Ser: 0.79 mg/dL (ref 0.44–1.00)
GFR, Estimated: >60 mL/min (ref >60)
GFR, Estimated: >60 mL/min (ref >60)
GFR, Estimated: >60 mL/min (ref >60)
GFR, Estimated: >60 mL/min (ref >60)
Glucose, Bld: 458 mg/dL — ABNORMAL HIGH (ref 70–99)
Glucose, Bld: 336 mg/dL — ABNORMAL HIGH (ref 70–99)
Glucose, Bld: 148 mg/dL — ABNORMAL HIGH (ref 70–99)
Glucose, Bld: 174 mg/dL — ABNORMAL HIGH (ref 70–99)
Potassium: 3.3 mmol/L — ABNORMAL LOW (ref 3.5–5.1)
Potassium: 3.8 mmol/L (ref 3.5–5.1)
Potassium: 3.4 mmol/L — ABNORMAL LOW (ref 3.5–5.1)
Potassium: 3.3 mmol/L — ABNORMAL LOW (ref 3.5–5.1)
Sodium: 132 mmol/L — ABNORMAL LOW (ref 135–145)
Sodium: 135 mmol/L (ref 135–145)
Sodium: 134 mmol/L — ABNORMAL LOW (ref 135–145)
Sodium: 133 mmol/L — ABNORMAL LOW (ref 135–145)

## 2022-10-14 LAB — BLOOD GAS, VENOUS
Acid-base deficit: 5.4 mmol/L — ABNORMAL HIGH (ref 0.0–2.0)
Bicarbonate: 20.6 mmol/L (ref 20.0–28.0)
Drawn by: 164
O2 Saturation: 56.8 %
Patient temperature: 37
pCO2, Ven: 41 mmHg — ABNORMAL LOW (ref 44–60)
pH, Ven: 7.31 (ref 7.25–7.43)
pO2, Ven: 39 mmHg (ref 32–45)

## 2022-10-14 LAB — URINALYSIS, ROUTINE W REFLEX MICROSCOPIC
Bacteria, UA: NONE SEEN
Bilirubin Urine: NEGATIVE
Glucose, UA: >=500 mg/dL — AB
Ketones, ur: 80 mg/dL — AB
Leukocytes,Ua: NEGATIVE
Nitrite: NEGATIVE
Protein, ur: 100 mg/dL — AB
Specific Gravity, Urine: 1.028 (ref 1.005–1.030)
pH: 6 (ref 5.0–8.0)

## 2022-10-14 LAB — GLUCOSE, CAPILLARY
Glucose-Capillary: 130 mg/dL — ABNORMAL HIGH (ref 70–99)
Glucose-Capillary: 148 mg/dL — ABNORMAL HIGH (ref 70–99)
Glucose-Capillary: 149 mg/dL — ABNORMAL HIGH (ref 70–99)
Glucose-Capillary: 151 mg/dL — ABNORMAL HIGH (ref 70–99)
Glucose-Capillary: 165 mg/dL — ABNORMAL HIGH (ref 70–99)
Glucose-Capillary: 171 mg/dL — ABNORMAL HIGH (ref 70–99)
Glucose-Capillary: 172 mg/dL — ABNORMAL HIGH (ref 70–99)
Glucose-Capillary: 182 mg/dL — ABNORMAL HIGH (ref 70–99)
Glucose-Capillary: 218 mg/dL — ABNORMAL HIGH (ref 70–99)
Glucose-Capillary: 344 mg/dL — ABNORMAL HIGH (ref 70–99)
Glucose-Capillary: 93 mg/dL (ref 70–99)

## 2022-10-14 LAB — I-STAT VENOUS BLOOD GAS, ED
Acid-base deficit: 1 mmol/L (ref 0.0–2.0)
Bicarbonate: 21.4 mmol/L (ref 20.0–28.0)
Calcium, Ion: 0.99 mmol/L — ABNORMAL LOW (ref 1.15–1.40)
HCT: 44 % (ref 36.0–46.0)
Hemoglobin: 15 g/dL (ref 12.0–15.0)
O2 Saturation: 82 %
Potassium: 3.4 mmol/L — ABNORMAL LOW (ref 3.5–5.1)
Sodium: 132 mmol/L — ABNORMAL LOW (ref 135–145)
TCO2: 22 mmol/L (ref 22–32)
pCO2, Ven: 29 mmHg — ABNORMAL LOW (ref 44–60)
pH, Ven: 7.476 — ABNORMAL HIGH (ref 7.25–7.43)
pO2, Ven: 42 mmHg (ref 32–45)

## 2022-10-14 LAB — HEPATIC FUNCTION PANEL
ALT: 19 U/L (ref 0–44)
AST: 24 U/L (ref 15–41)
Albumin: 4.1 g/dL (ref 3.5–5.0)
Alkaline Phosphatase: 81 U/L (ref 38–126)
Bilirubin, Direct: 0.1 mg/dL (ref 0.0–0.2)
Indirect Bilirubin: 0.9 mg/dL (ref 0.3–0.9)
Total Bilirubin: 1 mg/dL (ref 0.3–1.2)
Total Protein: 7.5 g/dL (ref 6.5–8.1)

## 2022-10-14 LAB — CBG MONITORING, ED
Glucose-Capillary: 452 mg/dL — ABNORMAL HIGH (ref 70–99)
Glucose-Capillary: 395 mg/dL — ABNORMAL HIGH (ref 70–99)

## 2022-10-14 LAB — I-STAT BETA HCG BLOOD, ED (MC, WL, AP ONLY): I-stat hCG, quantitative: <5 m[IU]/mL (ref <5)

## 2022-10-14 LAB — LIPASE, BLOOD: Lipase: 20 U/L (ref 11–51)

## 2022-10-14 LAB — HEMOGLOBIN AND HEMATOCRIT, BLOOD
HCT: 41.1 % (ref 36.0–46.0)
Hemoglobin: 13.7 g/dL (ref 12.0–15.0)

## 2022-10-14 MED ORDER — SODIUM CHLORIDE 0.9 % IV BOLUS
1000.0000 mL | Freq: Once | INTRAVENOUS | Status: AC
  Administered 2022-10-14: 1000 mL via INTRAVENOUS

## 2022-10-14 MED ORDER — MORPHINE SULFATE (PF) 2 MG/ML IV SOLN
2.0000 mg | Freq: Once | INTRAVENOUS | Status: AC
  Administered 2022-10-14: 2 mg via INTRAVENOUS
  Filled 2022-10-14: qty 1

## 2022-10-14 MED ORDER — POTASSIUM CHLORIDE 10 MEQ/100ML IV SOLN
10.0000 meq | INTRAVENOUS | Status: AC
  Administered 2022-10-14 (×2): 10 meq via INTRAVENOUS
  Filled 2022-10-14 (×2): qty 100

## 2022-10-14 MED ORDER — CALCIUM GLUCONATE-NACL 1-0.675 GM/50ML-% IV SOLN
1.0000 g | Freq: Once | INTRAVENOUS | Status: AC
  Administered 2022-10-14: 1000 mg via INTRAVENOUS
  Filled 2022-10-14: qty 50

## 2022-10-14 MED ORDER — INSULIN REGULAR(HUMAN) IN NACL 100-0.9 UT/100ML-% IV SOLN
INTRAVENOUS | Status: DC
  Administered 2022-10-14: 9 [IU]/h via INTRAVENOUS
  Filled 2022-10-14 (×2): qty 100

## 2022-10-14 MED ORDER — POTASSIUM CHLORIDE 10 MEQ/100ML IV SOLN
10.0000 meq | INTRAVENOUS | Status: DC
  Administered 2022-10-14 (×2): 10 meq via INTRAVENOUS
  Filled 2022-10-14 (×2): qty 100

## 2022-10-14 MED ORDER — ONDANSETRON HCL 4 MG/2ML IJ SOLN
4.0000 mg | Freq: Four times a day (QID) | INTRAMUSCULAR | Status: DC | PRN
  Administered 2022-10-14 – 2022-10-16 (×5): 4 mg via INTRAVENOUS
  Filled 2022-10-14 (×6): qty 2

## 2022-10-14 MED ORDER — POTASSIUM CHLORIDE 10 MEQ/100ML IV SOLN: 10.0000 meq | INTRAVENOUS | Status: DC

## 2022-10-14 MED ORDER — INSULIN ASPART 100 UNIT/ML IJ SOLN: 7.0000 [IU] | Freq: Once | INTRAMUSCULAR | Status: DC

## 2022-10-14 MED ORDER — POTASSIUM CHLORIDE CRYS ER 20 MEQ PO TBCR
40.0000 meq | EXTENDED_RELEASE_TABLET | ORAL | Status: AC
  Administered 2022-10-14: 40 meq via ORAL
  Filled 2022-10-14: qty 2

## 2022-10-14 MED ORDER — MORPHINE SULFATE (PF) 2 MG/ML IV SOLN
2.0000 mg | INTRAVENOUS | Status: AC | PRN
  Administered 2022-10-14 – 2022-10-15 (×3): 2 mg via INTRAVENOUS
  Filled 2022-10-14 (×3): qty 1

## 2022-10-14 MED ORDER — PANTOPRAZOLE SODIUM 40 MG IV SOLR
40.0000 mg | Freq: Two times a day (BID) | INTRAVENOUS | Status: DC
  Administered 2022-10-14 – 2022-10-16 (×5): 40 mg via INTRAVENOUS
  Filled 2022-10-14 (×5): qty 10

## 2022-10-14 MED ORDER — LACTATED RINGERS IV SOLN: INTRAVENOUS | Status: DC

## 2022-10-14 MED ORDER — DEXTROSE 50 % IV SOLN: 0.0000 mL | INTRAVENOUS | Status: DC | PRN

## 2022-10-14 MED ORDER — LACTATED RINGERS IV BOLUS: 1000.0000 mL | Freq: Once | INTRAVENOUS | Status: DC

## 2022-10-14 MED ORDER — DEXTROSE IN LACTATED RINGERS 5 % IV SOLN: INTRAVENOUS | Status: DC

## 2022-10-14 MED ORDER — SODIUM CHLORIDE 0.9 % IV BOLUS
500.0000 mL | INTRAVENOUS | Status: DC
  Administered 2022-10-14: 500 mL via INTRAVENOUS

## 2022-10-14 NOTE — Plan of Care (Signed)

## 2022-10-14 NOTE — Inpatient Diabetes Management (Signed)
Inpatient Diabetes Program Recommendations  AACE/ADA: New Consensus Statement on Inpatient Glycemic Control   Target Ranges:  Prepandial:   less than 140 mg/dL      Peak postprandial:   less than 180 mg/dL (1-2 hours)      Critically ill patients:  140 - 180 mg/dL    Latest Reference Range & Units 10/14/22 09:07  Glucose-Capillary 70 - 99 mg/dL 914 (H)    Latest Reference Range & Units 10/14/22 09:08  CO2 22 - 32 mmol/L 20 (L)  Glucose 70 - 99 mg/dL 782 (H)  Anion gap 5 - 15  18 (H)    Latest Reference Range & Units 10/14/22 09:08  Beta-Hydroxybutyric Acid 0.05 - 0.27 mmol/L 4.38 (H)   Review of Glycemic Control  Diabetes history: DM1 (does NOT make any insulin; requires basal, correction, and carb coverage insulin) Outpatient Diabetes medications: T-Slim insulin pump with Novolog, Dexcom CGM Current orders for Inpatient glycemic control: IV insulin  Inpatient Diabetes Program Recommendations:    Insulin: IV insulin should be continued until acidosis has completely resolved. Once acidosis is resolved and provider is ready to transition from IV to SQ insulin, please consider ordering Semglee 15 units Q24H, CBGs Q4H, Novolog 0-9 units Q4H, and when diet ordered Novolog 3 units TID with meals for meal coverage if patient eats at least 50% of meals.  NOTE: Noted consult for Diabetes Coordinator. Diabetes Coordinator is not on campus over the weekend but available by pager from 8am to 5pm for questions or concerns. Chart reviewed. Patient is currently in ED and being admitted with DKA. Patient was recently inpatient 09/04/22-09/08/22 (with DKA) and diabetes coordinator spoke with patient on 09/07/22. Per chart review, patient sees Dr. Lonzo Cloud [facility]) and was last seen on 06/08/22. Per office note on 06/08/22, patient uses T-Slim insulin pump and the following should be insulin pump settings:  Basal 0.7 units/hr Total basal: 16.8 units/day  Insulin to Carb ratio 1:1 (pt  guesses at carbs so instructed to put in 4 grams for meals or 2 grams for snacks)  Insulin Sensitivity 1:45 mg/dl (1 unit drops glucose 45 mg/dl)  Thanks, Orlando Penner, RN, MSN, CDCES Diabetes Coordinator Inpatient Diabetes Program [phone] (Team Pager from 8am to 5pm)

## 2022-10-14 NOTE — ED Triage Notes (Signed)
Pt bib ems from a friends house c/o hyperglycemia, N/V, and fatigue for the past 2 days. Pt was out with friends two days ago at a bar and afterwards started feeling unwell. Pt has a hx DM1 and noted on her insulin pump sugar high. Pt usually experience two episodes of DKA a year. Pt was seen at [facility] 10/13/22 and prescribed Zofran and Toradol which she was u/t to get filled.    BP 136/90 HR 81 RR 22 RA 100%  Pt received 550 cc normal saline.

## 2022-10-14 NOTE — H&P (Signed)
History and Physical    Patient: Judy Cook WNU:[id number] DOB: 2000/10/23 DOA: 10/14/2022 DOS: the patient was seen and examined on 10/14/2022 PCP: Elenore Paddy, NP  Patient coming from: Home  Chief Complaint: Nausea and vomiting  HPI: Yanil Dawe is a 22 y.o. female with medical history significant of diabet mellitus type I, HSV, and GERD who presents with complaints of nausea and vomiting.  She reports going out with friends 2 nights ago where she she drank a lot of tequila.  The following morning around 4 AM she developed abdominal pain with persistent nausea and vomiting.  She estimated vomiting over she was not able to keep any significant food or liquids down and she was taken to [facility] emergency department for evaluation.  Workup noted negative lipase and potassium mildly low at 3.4.  Patient given 1 L normal saline IV fluids, antiemetics, and ultimately was discharged.  However, patient reported that she still was nauseous, and was not unable to keep any significant amount of food or liquids down.  Patient reported seeing some blood present in her vomitus and also reported associated symptoms of left lower quadrant abdominal pain.  Denied having any fever, cough, shortness of breath, dysuria, or recent sick contacts or knowledge.  Last episode of emesis was prior to coming to the hospital.  In the emergency department patient was noted to be mildly tachypneic with blood pressures elevated up to 151/97, all other vital signs maintained.  Labs significant for WBC 13.4, sodium 132, potassium 3.4, chloride 94, CO2 20, BUN 15, creatinine 1.12, glucose 458, anion gap 18, lipase 20.  Urinalysis Review of Systems: As mentioned in the history of present illness. All other systems reviewed and are negative. 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 She has never used smokeless tobacco. She reports current alcohol use. She reports current drug use. Drug: Marijuana.  No Known Allergies  Family History  Problem Relation Age of Onset   Diabetes Paternal Grandfather    Heart attack Paternal Grandfather    Diabetes Paternal Grandmother    Cancer Maternal Grandmother        lung   Alcohol abuse Maternal Grandfather    Alcohol abuse Father    Drug abuse Father    High Cholesterol Father    Alcohol abuse Mother    Depression Mother    Drug abuse Mother     Prior to Admission medications   Medication Sig Start Date End Date Taking? Authorizing Provider  insulin aspart (NOVOLOG) 100 UNIT/ML injection Max Daily 60 units per pump 06/08/22   Shamleffer, Konrad Dolores, MD  norethindrone (MICRONOR) 0.35 MG tablet Take 1 tablet (0.35 mg total) by mouth daily. 09/25/22   Hyacinth Meeker,  Elnita Maxwell, MD  ondansetron (ZOFRAN) 4 MG tablet Take 1 tablet (4 mg total) by mouth every 6 (six) hours. 10/13/22   Cristopher Peru, PA-C    Physical Exam: Vitals:   10/14/22 0901 10/14/22 0902  BP: (!) 151/97   Pulse: 67   Resp: 12   Temp: 98.6 F (37 C)   TempSrc: Oral   SpO2: 97%   Weight:  63.5 kg  Height:  5' (1.524 m)    Constitutional: Young female currently in no acute distress Eyes: PERRL, lids and conjunctivae normal ENMT:  Mucous membranes are moist.  Normal dentition Neck: normal, supple,   Respiratory: clear to auscultation bilaterally, no wheezing, no crackles. Normal respiratory effort. No accessory muscle use.  Cardiovascular: Regular rate and rhythm, no murmurs / rubs / gallops. No extremity edema. 2+ pedal pulses.   Abdomen: No significant tenderness to palpation noted at this time.   Bowel sounds positive.  Musculoskeletal: no clubbing / cyanosis. No joint deformity upper and lower extremities. Good ROM, no contractures. Normal muscle tone.  Skin: no rashes, lesions, ulcers. No induration Neurologic: CN 2-12 grossly intact. Sensation intact, DTR normal. Strength 5/5 in all 4.  Psychiatric: Normal judgment and insight. Alert and oriented x 3. Normal mood.   Data Reviewed:  EKG revealed a sinus rhythm at 71 bpm.  Reviewed labs, imaging, and pertinent records as documented in this note.  Assessment and Plan: DKA, type I Patient presented with glucose elevated up to 458 with initial anion gap of 18 and beta hydroxybutyrate acid 4.38.  Urinalysis was noted to have glucose and ketones present.  With patient's recent use of alcohol could be mixed picture.  Her last hemoglobin A1c noted to be 7.6 on 5/14. -Admit to progressive bed -Hyperglycemia order set utilized with insulin drip and IV fluids -N.p.o. -Serial BMPs, hemoglobin A1c in a.m.  -Correct electrolytes as needed -Monitoring for AG closure and will transition to subcutaneous insulin once able -Diabetes education consulted.  Leukocytosis Acute.  WBC elevated at 13.4.  Possibly reactive to above.  Nausea and vomiting   Hematemesis Acute.  Patient presents with complaints of nausea and vomiting some blood present in emesis.  Hemoglobin noted to be stable at 13.7.  Question possibility of gastritis versus Mallory Weiss tear due to reports of repeated nausea and vomiting.  No recurrent so vomiting since admission. -Aspiration precautions with  elevation head of the bed -Recheck H&H -Protonix 40 mg IV twice daily  Hypokalemia Hypocalcemia Acute.  Initial potassium 3.3, calcium 8.7, ionized calcium 0.99.  Patient had been ordered 20 mEq of potassium chloride IV. -Given additional potassium chloride 20 mEq  IV -Give 2 g of calcium gluconate IV -Continue to monitor and replace electrolytes as needed  Hyponatremia Acute.  Sodium noted to be 132, but noted to be within normal limits when adjusted for hyperglycemia. -Continue to monitor  Left lower quadrant abdominal pain Prior to arrival.  Patient reported having left lower quadrant abdominal pain prior to coming into the hospital, but reports symptoms have since resolved. -Continue to monitor  GERD -Protonix IV  DVT prophylaxis: SCDs due to reports of vomiting blood Advance Care Planning:   Code Status: Full Code  Consults:   Family Communication: None requested  Severity of Illness: The appropriate patient status for this patient is OBSERVATION. Observation status is judged to be reasonable and necessary in order to provide the required intensity of service to ensure the patient's safety. The patient's presenting symptoms, physical exam  findings, and initial radiographic and laboratory data in the context of their medical condition is felt to place them at decreased risk for further clinical deterioration. Furthermore, it is anticipated that the patient will be medically stable for discharge from the hospital within 2 midnights of admission.   Author: Clydie Braun, MD 10/14/2022 10:48 AM  For on call review [email or web address].

## 2022-10-14 NOTE — ED Notes (Signed)
ED TO INPATIENT HANDOFF REPORT  ED Nurse Name and Phone #: Jess Barters [phone]  S Name/Age/Gender Judy Cook 22 y.o. female Room/Bed: 018C/018C  Code Status   Code Status: Full Code  Home/SNF/Other Home Patient oriented to: self, place, time, and situation Is this baseline? Yes   Triage Complete: Triage complete  Chief Complaint DKA, type 1 (HCC) [E10.10]  Triage Note Pt bib ems from a friends house c/o hyperglycemia, N/V, and fatigue for the past 2 days. Pt was out with friends two days ago at a bar and afterwards started feeling unwell. Pt has a hx DM1 and noted on her insulin pump sugar high. Pt usually experience two episodes of DKA a year. Pt was seen at [facility] 10/13/22 and prescribed Zofran and Toradol which she was u/t to get filled.    BP 136/90 HR 81 RR 22 RA 100%  Pt received 550 cc normal saline.    Allergies No Known Allergies  Level of Care/Admitting Diagnosis ED Disposition     ED Disposition  Admit   Condition  --   Comment  Hospital Area: [facility] [[id number]]  Level of Care: Progressive [102]  Admit to Progressive based on following criteria: GI, ENDOCRINE disease patients with GI bleeding, acute liver failure or pancreatitis, stable with diabetic ketoacidosis or thyrotoxicosis (hypothyroid) state.  May place patient in observation at [facility] or [facility] if equivalent level of care is available:: No  Covid Evaluation: Asymptomatic - no recent exposure (last 10 days) testing not required  Diagnosis: DKA, type 1 [facility]) [[id number]]  Admitting Physician: Clydie Braun [[id number]]  Attending Physician: Clydie Braun [[id number]]          B Medical/Surgery History Past Medical History:  Diagnosis Date   Allergic rhinitis 07/07/2021   COVID 2020   mild   Diabetes mellitus (HCC)    Type 1, type 5   Dysmenorrhea 02/20/2019   GERD (gastroesophageal reflux disease)    Headache    History of herpes genitalis  06/24/2021   Intractable vomiting    SIRS (systemic inflammatory response syndrome) (HCC) 12/03/2019   Past Surgical History:  Procedure Laterality Date   DILATION AND EVACUATION N/A 07/05/2022   Procedure: DILATATION AND EVACUATION;  Surgeon: Peach Bing, MD;  Location: [facility] OR;  Service: Gynecology;  Laterality: N/A;   OPERATIVE ULTRASOUND N/A 07/05/2022   Procedure: OPERATIVE ULTRASOUND;  Surgeon: Forsyth Bing, MD;  Location: [facility] OR;  Service: Gynecology;  Laterality: N/A;     A IV Location/Drains/Wounds Patient Lines/Drains/Airways Status     Active Line/Drains/Airways     Name Placement date Placement time Site Days   Peripheral IV 10/14/22 20 G Left Antecubital 10/14/22  0904  Antecubital  less than 1            Intake/Output Last 24 hours No intake or output data in the 24 hours ending 10/14/22 1107  Labs/Imaging Results for orders placed or performed during the hospital encounter of 10/14/22 (from the past 48 hour(s))  CBG monitoring, ED     Status: Abnormal   Collection Time: 10/14/22  9:07 AM  Result Value Ref Range   Glucose-Capillary 452 (H) 70 - 99 mg/dL    Comment: Glucose reference range applies only to samples taken after fasting for at least 8 hours.  Basic metabolic panel     Status: Abnormal   Collection Time: 10/14/22  9:08 AM  Result Value Ref Range   Sodium 132 (L) 135 - 145 mmol/L  Potassium 3.3 (L) 3.5 - 5.1 mmol/L   Chloride 94 (L) 98 - 111 mmol/L   CO2 20 (L) 22 - 32 mmol/L   Glucose, Bld 458 (H) 70 - 99 mg/dL    Comment: Glucose reference range applies only to samples taken after fasting for at least 8 hours.   BUN 15 6 - 20 mg/dL   Creatinine, Ser 1.61 (H) 0.44 - 1.00 mg/dL   Calcium 8.7 (L) 8.9 - 10.3 mg/dL   GFR, Estimated >09 >60 mL/min    Comment: (NOTE) Calculated using the CKD-EPI Creatinine Equation (2021)    Anion gap 18 (H) 5 - 15    Comment: Performed at [facility], [address] [place], [place] [address]   CBC     Status: Abnormal   Collection Time: 10/14/22  9:08 AM  Result Value Ref Range   WBC 13.4 (H) 4.0 - 10.5 K/uL   RBC 4.86 3.87 - 5.11 MIL/uL   Hemoglobin 13.7 12.0 - 15.0 g/dL   HCT 81.1 91.4 - 78.2 %   MCV 82.1 80.0 - 100.0 fL   MCH 28.2 26.0 - 34.0 pg   MCHC 34.3 30.0 - 36.0 g/dL   RDW 95.6 (H) 21.3 - 08.6 %   Platelets 431 (H) 150 - 400 K/uL   nRBC 0.0 0.0 - 0.2 %    Comment: Performed at [facility], [address], [place], [place] [address]  Beta-hydroxybutyric acid     Status: Abnormal   Collection Time: 10/14/22  9:08 AM  Result Value Ref Range   Beta-Hydroxybutyric Acid 4.38 (H) 0.05 - 0.27 mmol/L    Comment: Performed at [facility], [address] [place], [place] [address]  Lipase, blood     Status: None   Collection Time: 10/14/22  9:08 AM  Result Value Ref Range   Lipase 20 11 - 51 U/L    Comment: Performed at [facility], [address], [place], [place] [address]  Hepatic function panel     Status: None   Collection Time: 10/14/22  9:08 AM  Result Value Ref Range   Total Protein 7.5 6.5 - 8.1 g/dL   Albumin 4.1 3.5 - 5.0 g/dL   AST 24 15 - 41 U/L   ALT 19 0 - 44 U/L   Alkaline Phosphatase 81 38 - 126 U/L   Total Bilirubin 1.0 0.3 - 1.2 mg/dL   Bilirubin, Direct 0.1 0.0 - 0.2 mg/dL   Indirect Bilirubin 0.9 0.3 - 0.9 mg/dL    Comment: Performed at [facility], [address], [place], [place] [address]  I-Stat beta hCG blood, ED     Status: None   Collection Time: 10/14/22  9:12 AM  Result Value Ref Range   I-stat hCG, quantitative <5.0 <5 mIU/mL   Comment 3            Comment:   GEST. AGE      CONC.  (mIU/mL)   <=1 WEEK        5 - 50     2 WEEKS       50 - 500     3 WEEKS       100 - 10,000     4 WEEKS     1,000 - 30,000        FEMALE AND NON-PREGNANT FEMALE:     LESS THAN 5 mIU/mL   I-Stat venous blood gas, ED  Status: Abnormal   Collection Time: 10/14/22  9:14 AM  Result Value Ref Range   pH, Ven 7.476 (H)  7.25 - 7.43   pCO2, Ven 29.0 (L) 44 - 60 mmHg   pO2, Ven 42 32 - 45 mmHg   Bicarbonate 21.4 20.0 - 28.0 mmol/L   TCO2 22 22 - 32 mmol/L   O2 Saturation 82 %   Acid-base deficit 1.0 0.0 - 2.0 mmol/L   Sodium 132 (L) 135 - 145 mmol/L   Potassium 3.4 (L) 3.5 - 5.1 mmol/L   Calcium, Ion 0.99 (L) 1.15 - 1.40 mmol/L   HCT 44.0 36.0 - 46.0 %   Hemoglobin 15.0 12.0 - 15.0 g/dL   Sample type VENOUS   Urinalysis, Routine w reflex microscopic -Urine, Clean Catch     Status: Abnormal   Collection Time: 10/14/22  9:52 AM  Result Value Ref Range   Color, Urine STRAW (A) YELLOW   APPearance CLEAR CLEAR   Specific Gravity, Urine 1.028 1.005 - 1.030   pH 6.0 5.0 - 8.0   Glucose, UA >=500 (A) NEGATIVE mg/dL   Hgb urine dipstick SMALL (A) NEGATIVE   Bilirubin Urine NEGATIVE NEGATIVE   Ketones, ur 80 (A) NEGATIVE mg/dL   Protein, ur 045 (A) NEGATIVE mg/dL   Nitrite NEGATIVE NEGATIVE   Leukocytes,Ua NEGATIVE NEGATIVE   RBC / HPF 0-5 0 - 5 RBC/hpf   WBC, UA 0-5 0 - 5 WBC/hpf   Bacteria, UA NONE SEEN NONE SEEN   Squamous Epithelial / HPF 0-5 0 - 5 /HPF    Comment: Performed at [facility], [address] [place], [place] [address]   No results found.  Pending Labs Unresulted Labs (From admission, onward)     Start     Ordered   10/14/22 1500  Beta-hydroxybutyric acid  (Diabetes Ketoacidosis (DKA))  Now then every 8 hours,   R (with TIMED occurrences)      10/14/22 1051   10/14/22 1300  Basic metabolic panel  (Diabetes Ketoacidosis (DKA))  STAT Now then every 4 hours ,   R (with STAT occurrences)      10/14/22 1051   10/14/22 1300  Hemoglobin and hematocrit, blood  Once-Timed,   TIMED        10/14/22 1053   10/14/22 0905  Blood gas, venous (at [facility] and [facility])  Once,   R        10/14/22 0905            Vitals/Pain Today's Vitals   10/14/22 0901 10/14/22 0902 10/14/22 0908 10/14/22 1011  BP: (!) 151/97     Pulse: 67     Resp: 12     Temp: 98.6 F (37 C)     TempSrc: Oral      SpO2: 97%     Weight:  63.5 kg    Height:  5' (1.524 m)    PainSc:  7  7  Asleep    Isolation Precautions No active isolations  Medications Medications  ondansetron (ZOFRAN) injection 4 mg (4 mg Intravenous Given 10/14/22 0943)  sodium chloride 0.9 % bolus 500 mL (has no administration in time range)  insulin regular, human (MYXREDLIN) 100 units/ 100 mL infusion (has no administration in time range)  lactated ringers infusion (has no administration in time range)  dextrose 5 % in lactated ringers infusion (has no administration in time range)  dextrose 50 % solution 0-50 mL (has no administration in time range)  potassium chloride  10 mEq in 100 mL IVPB (10 mEq Intravenous New Bag/Given 10/14/22 1052)  pantoprazole (PROTONIX) injection 40 mg (has no administration in time range)  calcium gluconate 1 g/ 50 mL sodium chloride IVPB (has no administration in time range)  sodium chloride 0.9 % bolus 1,000 mL (0 mLs Intravenous Stopped 10/14/22 1053)  morphine (PF) 2 MG/ML injection 2 mg (2 mg Intravenous Given 10/14/22 0943)    Mobility walks     Focused Assessments    R Recommendations: See Admitting Provider Note  Report given to:   Additional Notes:

## 2022-10-14 NOTE — ED Notes (Signed)
Help get patient into a gown on the monitor patient is resting with call bell in reach 

## 2022-10-14 NOTE — ED Provider Notes (Signed)
[place] EMERGENCY DEPARTMENT AT [facility] Provider Note   CSN: [id number] Arrival date & time: 10/14/22  4098     History  No chief complaint on file.   Judy Cook is a 22 y.o. female with PMHx DM1 who presents to ED complaining of hyperglycemia, abdominal pain, nausea, and emesis x2 days. States that she has been compliant with her insulin -  but reports drinking a lot of alcohol on 6/20. Stating that she vomited/dry-heaved (x20) today. Endorsing some hematemesis today - no vomiting in ED. Took 4 units of insulin this morning around 4AM.   Denies fever, chest pain, dyspnea, diarrhea, loss of consciousness.  HPI     Home Medications Prior to Admission medications   Medication Sig Start Date End Date Taking? Authorizing Provider  insulin aspart (NOVOLOG) 100 UNIT/ML injection Max Daily 60 units per pump 06/08/22   Shamleffer, Konrad Dolores, MD  norethindrone (MICRONOR) 0.35 MG tablet Take 1 tablet (0.35 mg total) by mouth daily. 09/25/22   Jerene Bears, MD  ondansetron (ZOFRAN) 4 MG tablet Take 1 tablet (4 mg total) by mouth every 6 (six) hours. 10/13/22   Cristopher Peru, PA-C      Allergies    Patient has no known allergies.    Review of Systems   Review of Systems  Gastrointestinal:  Positive for abdominal pain, nausea and vomiting.    Physical Exam Updated Vital Signs BP (!) 151/97 (BP Location: Right Arm)   Pulse 67   Temp 98.6 F (37 C) (Oral)   Resp 12   Ht 5' (1.524 m)   Wt 63.5 kg   LMP 09/08/2022 (Exact Date)   SpO2 97%   BMI 27.34 kg/m  Physical Exam Vitals and nursing note reviewed.  Constitutional:      General: She is in acute distress.  HENT:     Head: Normocephalic and atraumatic.     Mouth/Throat:     Mouth: Mucous membranes are moist.     Pharynx: No oropharyngeal exudate or posterior oropharyngeal erythema.  Eyes:     General: No scleral icterus.       Right eye: No discharge.        Left eye: No discharge.      Conjunctiva/sclera: Conjunctivae normal.  Cardiovascular:     Rate and Rhythm: Normal rate and regular rhythm.     Pulses: Normal pulses.     Heart sounds: Normal heart sounds. No murmur heard. Pulmonary:     Effort: Pulmonary effort is normal. No respiratory distress.     Breath sounds: No wheezing, rhonchi or rales.  Abdominal:     General: Abdomen is flat. Bowel sounds are normal.     Palpations: Abdomen is soft.     Comments: Tenderness to palpation of LMQ.  Musculoskeletal:     Right lower leg: No edema.     Left lower leg: No edema.  Skin:    General: Skin is warm and dry.     Findings: No rash.  Neurological:     General: No focal deficit present.     Mental Status: She is alert. Mental status is at baseline.  Psychiatric:        Mood and Affect: Mood normal.     ED Results / Procedures / Treatments   Labs (all labs ordered are listed, but only abnormal results are displayed) Labs Reviewed  BASIC METABOLIC PANEL - Abnormal; Notable for the following components:      Result  Value   Sodium 132 (*)    Potassium 3.3 (*)    Chloride 94 (*)    CO2 20 (*)    Glucose, Bld 458 (*)    Creatinine, Ser 1.12 (*)    Calcium 8.7 (*)    Anion gap 18 (*)    All other components within normal limits  CBC - Abnormal; Notable for the following components:   WBC 13.4 (*)    RDW 17.2 (*)    Platelets 431 (*)    All other components within normal limits  URINALYSIS, ROUTINE W REFLEX MICROSCOPIC - Abnormal; Notable for the following components:   Color, Urine STRAW (*)    Glucose, UA >=500 (*)    Hgb urine dipstick SMALL (*)    Ketones, ur 80 (*)    Protein, ur 100 (*)    All other components within normal limits  BETA-HYDROXYBUTYRIC ACID - Abnormal; Notable for the following components:   Beta-Hydroxybutyric Acid 4.38 (*)    All other components within normal limits  CBG MONITORING, ED - Abnormal; Notable for the following components:   Glucose-Capillary 452 (*)    All other  components within normal limits  I-STAT VENOUS BLOOD GAS, ED - Abnormal; Notable for the following components:   pH, Ven 7.476 (*)    pCO2, Ven 29.0 (*)    Sodium 132 (*)    Potassium 3.4 (*)    Calcium, Ion 0.99 (*)    All other components within normal limits  LIPASE, BLOOD  HEPATIC FUNCTION PANEL  BLOOD GAS, VENOUS  BASIC METABOLIC PANEL  BASIC METABOLIC PANEL  BASIC METABOLIC PANEL  BETA-HYDROXYBUTYRIC ACID  BETA-HYDROXYBUTYRIC ACID  HEMOGLOBIN AND HEMATOCRIT, BLOOD  I-STAT BETA HCG BLOOD, ED ([facility], [facility], [facility] ONLY)    EKG EKG Interpretation  Date/Time:  Saturday October 14 2022 09:01:38 EDT Ventricular Rate:  71 PR Interval:  125 QRS Duration: 76 QT Interval:  422 QTC Calculation: 459 R Axis:   72 Text Interpretation: Sinus rhythm Confirmed by Kristine Royal [phone]) on 10/14/2022 9:20:43 AM  Radiology No results found.  Procedures Procedures    Medications Ordered in ED Medications  ondansetron (ZOFRAN) injection 4 mg (4 mg Intravenous Given 10/14/22 0943)  sodium chloride 0.9 % bolus 500 mL (has no administration in time range)  insulin regular, human (MYXREDLIN) 100 units/ 100 mL infusion (has no administration in time range)  lactated ringers infusion (has no administration in time range)  dextrose 5 % in lactated ringers infusion (has no administration in time range)  dextrose 50 % solution 0-50 mL (has no administration in time range)  potassium chloride 10 mEq in 100 mL IVPB (10 mEq Intravenous New Bag/Given 10/14/22 1052)  pantoprazole (PROTONIX) injection 40 mg (has no administration in time range)  calcium gluconate 1 g/ 50 mL sodium chloride IVPB (has no administration in time range)  sodium chloride 0.9 % bolus 1,000 mL (0 mLs Intravenous Stopped 10/14/22 1053)  morphine (PF) 2 MG/ML injection 2 mg (2 mg Intravenous Given 10/14/22 0943)    ED Course/ Medical Decision Making/ A&P Clinical Course as of 10/14/22 1107  Sat Oct 14, 2022  1033 EKG 12-Lead [SM]     Clinical Course User Index [SM] Dorthy Cooler, PA-C                             Medical Decision Making Amount and/or Complexity of Data Reviewed Labs: ordered. ECG/medicine tests:  Decision-making  details documented in ED Course.  Risk Prescription drug management. Decision regarding hospitalization.    This patient presents to the ED for concern of hyperglycemia, N/V and fatigue x2 days, this involves an extensive number of treatment options, and is a complaint that carries with it a high risk of complications and morbidity.  The differential diagnosis includes gastroenteritis, colitis, small bowel obstruction, appendicitis, cholecystitis, pancreatitis, nephrolithiasis, UTI, pyleonephritis, ruptured ectopic pregnancy, PID, ovarian. DKA/HHS, hyperglycemia.   Co morbidities that complicate the patient evaluation  DM1    Lab Tests:  I Ordered, and personally interpreted labs.  The pertinent results include:   CBC with differential: leukocytosis (13.4); no anemia CMP: hyponatremia; hypokalemia; hyperglycemia, mildly elevated Cr; Anion gap 18 Lipase: within normal limits UA: ketones (80); glucose (>500); no concern for UTI [id number] Beta-hydroxybutyric acid: 4.38 CBG: 452 VBG: 4.47 (pH)  29 (CO2)  21.4 (Bicarb)    Cardiac Monitoring: / EKG:  The patient was maintained on a cardiac monitor.  I personally viewed and interpreted the cardiac monitored which showed an underlying rhythm of: sinus rhythm without acute ST changes or arrhythmias   Consultations Obtained:  I requested consultation with the Hospitalist Dr. Katrinka Blazing,  and discussed lab and imaging findings as well as pertinent plan - they agree to admit patient   Problem List / ED Course / Critical interventions / Medication management  Patient presented to ED concerning for DKA Abdominal pain, nausea, vomiting, hyperglycemia started after a night of heavy drinking on 6/20. Patient not able to keep  food down since 6/20. Patient complaining of blood in vomit - no vomiting in ED and Hbg within normal limits. Physical exam with tenderness to palpation of middle abdomen. No other findings. CBG 452. UA with ketones. Beta-hydroxybutyric acid elevated at 4.38. BMP with hyponatremia (132), hypokalemia (3.3), anion gap 18. WBC 13.4.  Started IV potassium replacement and IV fluids. Will provide insulin after potassium supplementation. Consulting Hospitalist for admission given DKA. Dr. Katrinka Blazing agrees to admit patient. I have reviewed the patients home medicines and have made adjustments as needed   Social Determinants of Health:  none            Final Clinical Impression(s) / ED Diagnoses Final diagnoses:  Type 1 diabetes mellitus with ketoacidosis without coma [facility])    Rx / DC Orders ED Discharge Orders     None         Margarita Rana 10/14/22 1107    Wynetta Fines, MD 10/14/22 1645

## 2022-10-15 ENCOUNTER — Observation Stay (HOSPITAL_COMMUNITY): Payer: Federal, State, Local not specified - PPO

## 2022-10-15 DIAGNOSIS — K219 Gastro-esophageal reflux disease without esophagitis: Secondary | ICD-10-CM | POA: Diagnosis present

## 2022-10-15 DIAGNOSIS — Z9641 Presence of insulin pump (external) (internal): Secondary | ICD-10-CM | POA: Diagnosis present

## 2022-10-15 DIAGNOSIS — Z79899 Other long term (current) drug therapy: Secondary | ICD-10-CM | POA: Diagnosis not present

## 2022-10-15 DIAGNOSIS — J309 Allergic rhinitis, unspecified: Secondary | ICD-10-CM | POA: Diagnosis present

## 2022-10-15 DIAGNOSIS — F17210 Nicotine dependence, cigarettes, uncomplicated: Secondary | ICD-10-CM | POA: Diagnosis present

## 2022-10-15 DIAGNOSIS — F10120 Alcohol abuse with intoxication, uncomplicated: Secondary | ICD-10-CM | POA: Diagnosis present

## 2022-10-15 DIAGNOSIS — R1032 Left lower quadrant pain: Secondary | ICD-10-CM

## 2022-10-15 DIAGNOSIS — Z811 Family history of alcohol abuse and dependence: Secondary | ICD-10-CM | POA: Diagnosis not present

## 2022-10-15 DIAGNOSIS — E871 Hypo-osmolality and hyponatremia: Secondary | ICD-10-CM | POA: Diagnosis not present

## 2022-10-15 DIAGNOSIS — Z8616 Personal history of COVID-19: Secondary | ICD-10-CM | POA: Diagnosis not present

## 2022-10-15 DIAGNOSIS — E876 Hypokalemia: Secondary | ICD-10-CM | POA: Diagnosis not present

## 2022-10-15 DIAGNOSIS — E1010 Type 1 diabetes mellitus with ketoacidosis without coma: Secondary | ICD-10-CM

## 2022-10-15 DIAGNOSIS — Z794 Long term (current) use of insulin: Secondary | ICD-10-CM | POA: Diagnosis not present

## 2022-10-15 DIAGNOSIS — Z833 Family history of diabetes mellitus: Secondary | ICD-10-CM | POA: Diagnosis not present

## 2022-10-15 DIAGNOSIS — K920 Hematemesis: Secondary | ICD-10-CM | POA: Diagnosis present

## 2022-10-15 LAB — BASIC METABOLIC PANEL
Anion gap: 12 (ref 5–15)
Anion gap: 11 (ref 5–15)
BUN: 9 mg/dL (ref 6–20)
BUN: 6 mg/dL (ref 6–20)
CO2: 19 mmol/L — ABNORMAL LOW (ref 22–32)
CO2: 20 mmol/L — ABNORMAL LOW (ref 22–32)
Calcium: 8.9 mg/dL (ref 8.9–10.3)
Calcium: 9.1 mg/dL (ref 8.9–10.3)
Chloride: 103 mmol/L (ref 98–111)
Chloride: 102 mmol/L (ref 98–111)
Creatinine, Ser: 0.83 mg/dL (ref 0.44–1.00)
Creatinine, Ser: 0.69 mg/dL (ref 0.44–1.00)
GFR, Estimated: >60 mL/min (ref >60)
GFR, Estimated: >60 mL/min (ref >60)
Glucose, Bld: 198 mg/dL — ABNORMAL HIGH (ref 70–99)
Glucose, Bld: 167 mg/dL — ABNORMAL HIGH (ref 70–99)
Potassium: 3.9 mmol/L (ref 3.5–5.1)
Potassium: 3.4 mmol/L — ABNORMAL LOW (ref 3.5–5.1)
Sodium: 134 mmol/L — ABNORMAL LOW (ref 135–145)
Sodium: 133 mmol/L — ABNORMAL LOW (ref 135–145)

## 2022-10-15 LAB — GLUCOSE, CAPILLARY
Glucose-Capillary: 103 mg/dL — ABNORMAL HIGH (ref 70–99)
Glucose-Capillary: 109 mg/dL — ABNORMAL HIGH (ref 70–99)
Glucose-Capillary: 115 mg/dL — ABNORMAL HIGH (ref 70–99)
Glucose-Capillary: 120 mg/dL — ABNORMAL HIGH (ref 70–99)
Glucose-Capillary: 127 mg/dL — ABNORMAL HIGH (ref 70–99)
Glucose-Capillary: 138 mg/dL — ABNORMAL HIGH (ref 70–99)
Glucose-Capillary: 145 mg/dL — ABNORMAL HIGH (ref 70–99)
Glucose-Capillary: 158 mg/dL — ABNORMAL HIGH (ref 70–99)
Glucose-Capillary: 164 mg/dL — ABNORMAL HIGH (ref 70–99)
Glucose-Capillary: 167 mg/dL — ABNORMAL HIGH (ref 70–99)
Glucose-Capillary: 168 mg/dL — ABNORMAL HIGH (ref 70–99)
Glucose-Capillary: 180 mg/dL — ABNORMAL HIGH (ref 70–99)
Glucose-Capillary: 204 mg/dL — ABNORMAL HIGH (ref 70–99)
Glucose-Capillary: 221 mg/dL — ABNORMAL HIGH (ref 70–99)
Glucose-Capillary: 228 mg/dL — ABNORMAL HIGH (ref 70–99)
Glucose-Capillary: 261 mg/dL — ABNORMAL HIGH (ref 70–99)
Glucose-Capillary: 346 mg/dL — ABNORMAL HIGH (ref 70–99)

## 2022-10-15 MED ORDER — ENOXAPARIN SODIUM 40 MG/0.4ML IJ SOSY
40.0000 mg | PREFILLED_SYRINGE | INTRAMUSCULAR | Status: DC
  Administered 2022-10-15 – 2022-10-16 (×2): 40 mg via SUBCUTANEOUS
  Filled 2022-10-15 (×2): qty 0.4

## 2022-10-15 MED ORDER — INSULIN ASPART 100 UNIT/ML IJ SOLN
0.0000 [IU] | Freq: Three times a day (TID) | INTRAMUSCULAR | Status: DC
  Administered 2022-10-15: 7 [IU] via SUBCUTANEOUS
  Administered 2022-10-16: 3 [IU] via SUBCUTANEOUS

## 2022-10-15 MED ORDER — IOHEXOL 9 MG/ML PO SOLN
500.0000 mL | ORAL | Status: AC
  Administered 2022-10-15 (×2): 500 mL via ORAL

## 2022-10-15 MED ORDER — INSULIN GLARGINE-YFGN 100 UNIT/ML ~~LOC~~ SOLN
12.0000 [IU] | Freq: Every day | SUBCUTANEOUS | Status: DC
  Administered 2022-10-15: 12 [IU] via SUBCUTANEOUS
  Filled 2022-10-15 (×2): qty 0.12

## 2022-10-15 MED ORDER — POTASSIUM CHLORIDE CRYS ER 20 MEQ PO TBCR
40.0000 meq | EXTENDED_RELEASE_TABLET | Freq: Once | ORAL | Status: AC
  Administered 2022-10-15: 40 meq via ORAL
  Filled 2022-10-15: qty 2

## 2022-10-15 MED ORDER — OXYCODONE HCL 5 MG PO TABS
5.0000 mg | ORAL_TABLET | Freq: Four times a day (QID) | ORAL | Status: DC | PRN
  Filled 2022-10-15: qty 1

## 2022-10-15 MED ORDER — CYCLOBENZAPRINE HCL 5 MG PO TABS
5.0000 mg | ORAL_TABLET | Freq: Three times a day (TID) | ORAL | Status: DC | PRN
  Administered 2022-10-15 – 2022-10-16 (×3): 5 mg via ORAL
  Filled 2022-10-15 (×3): qty 1

## 2022-10-15 MED ORDER — SODIUM CHLORIDE 0.9 % IV SOLN
12.5000 mg | Freq: Four times a day (QID) | INTRAVENOUS | Status: DC | PRN
  Administered 2022-10-15 – 2022-10-16 (×3): 12.5 mg via INTRAVENOUS
  Filled 2022-10-15 (×3): qty 12.5

## 2022-10-15 MED ORDER — ACETAMINOPHEN 325 MG PO TABS
650.0000 mg | ORAL_TABLET | Freq: Four times a day (QID) | ORAL | Status: DC | PRN
  Administered 2022-10-15 (×2): 650 mg via ORAL
  Filled 2022-10-15 (×2): qty 2

## 2022-10-15 MED ORDER — MORPHINE SULFATE (PF) 2 MG/ML IV SOLN
1.0000 mg | INTRAVENOUS | Status: DC | PRN
  Administered 2022-10-15 (×3): 1 mg via INTRAVENOUS
  Filled 2022-10-15 (×3): qty 1

## 2022-10-15 MED ORDER — IOHEXOL 350 MG/ML SOLN
75.0000 mL | Freq: Once | INTRAVENOUS | Status: AC | PRN
  Administered 2022-10-15: 75 mL via INTRAVENOUS

## 2022-10-15 NOTE — Progress Notes (Signed)
PROGRESS NOTE        PATIENT DETAILS Name: Judy Cook Age: 22 y.o. Sex: female Date of Birth: Sep 06, 2000 Admit Date: 10/14/2022 Admitting Physician Clydie Braun, MD ZOX:WRUE, [place] Scrape, NP  Brief Summary: Patient is a 22 y.o.  female with history of DM-1 on insulin pump-admitted with 2-3-day history of LLQ abdominal pain associated nausea/vomiting-after a night out with friends drinking tequila.  She subsequently presented to the hospital with DKA and was admitted to the hospitalist service.  Significant events: 6/22>> admit to [facility]  Significant studies: None  Significant microbiology data: None  Procedures: None  Consults: None  Subjective: Still nauseous-complains of LLQ pain/discomfort.  Appears comfortable.  Does not think she has any appetite because of nausea.  Objective: Vitals: Blood pressure (!) 142/104, pulse 76, temperature 97.9 F (36.6 C), temperature source Axillary, resp. rate 18, height 5' (1.524 m), weight 63.5 kg, last menstrual period 09/08/2022, SpO2 97 %.   Exam: Gen Exam:Alert awake-not in any distress HEENT:atraumatic, normocephalic Chest: B/L clear to auscultation anteriorly CVS:S1S2 regular Abdomen: Soft-mild tenderness in LLQ area without any peritoneal signs. Extremities:no edema Neurology: Non focal Skin: no rash  Pertinent Labs/Radiology:    Latest Ref Rng & Units 10/14/2022    1:04 PM 10/14/2022    9:14 AM 10/14/2022    9:08 AM  CBC  WBC 4.0 - 10.5 K/uL   13.4   Hemoglobin 12.0 - 15.0 g/dL 45.4  09.8  11.9   Hematocrit 36.0 - 46.0 % 41.1  44.0  39.9   Platelets 150 - 400 K/uL   431     Lab Results  Component Value Date   NA 133 (L) 10/15/2022   K 3.4 (L) 10/15/2022   CL 102 10/15/2022   CO2 20 (L) 10/15/2022      Assessment/Plan: DKA Resolved Will maintain on insulin drip as she is still nauseous and complaining of LLQ pain.  It appears that the connector that connects the insulin  pump to her abdominal wall has malfunctioned-and she does not have a replacement.  DM-1 (A1c 7.6 on 5/14) On insulin pump prior to this hospitalization-see above-have asked RN staff to see if we can get in touch with our diabetic coordinator RN to see if we  have supplies for her pump. For now continue with insulin infusion-if her pump is not able to be hooked up due to lack of supplies-then we will have to put her back on basal/bolus regimen.  Nausea/vomiting/LLQ pain Unclear etiology-no diarrhea-benign exam Will check CT abdomen Clear liquid diet for now-and will advance based on her tolerance.  Hypokalemia Replete/recheck.  BMI: Estimated body mass index is 27.34 kg/m as calculated from the following:   Height as of this encounter: 5' (1.524 m).   Weight as of this encounter: 63.5 kg.   Code status:   Code Status: Full Code   DVT Prophylaxis: Place and maintain sequential compression device Start: 10/14/22 1101   Family Communication: None at bedside   Disposition Plan: Status is: Observation The patient will require care spanning > 2 midnights and should be moved to inpatient because: Severity of illness   Planned Discharge Destination:Home   Diet: Diet Order             Diet clear liquid Room service appropriate? Yes; Fluid consistency: Thin  Diet effective now  Antimicrobial agents: Anti-infectives (From admission, onward)    None        MEDICATIONS: Scheduled Meds:  iohexol  500 mL Oral Q1H   pantoprazole (PROTONIX) IV  40 mg Intravenous Q12H   Continuous Infusions:  dextrose 5% lactated ringers 125 mL/hr at 10/15/22 0820   insulin 3.6 Units/hr (10/15/22 0627)   lactated ringers Stopped (10/14/22 1419)   PRN Meds:.dextrose, morphine injection, ondansetron (ZOFRAN) IV   I have personally reviewed following labs and imaging studies  LABORATORY DATA: CBC: Recent Labs  Lab 10/13/22 0640 10/14/22 0908 10/14/22 0914  10/14/22 1304  WBC 6.8 13.4*  --   --   HGB 13.7 13.7 15.0 13.7  HCT 41.9 39.9 44.0 41.1  MCV 85.9 82.1  --   --   PLT 398 431*  --   --     Basic Metabolic Panel: Recent Labs  Lab 10/14/22 1304 10/14/22 1721 10/14/22 2106 10/15/22 0040 10/15/22 0507  NA 135 134* 133* 134* 133*  K 3.8 3.4* 3.3* 3.9 3.4*  CL 102 105 105 103 102  CO2 17* 18* 17* 19* 20*  GLUCOSE 336* 148* 174* 198* 167*  BUN 16 12 11 9 6   CREATININE 1.06* 0.90 0.79 0.83 0.69  CALCIUM 8.5* 8.5* 8.4* 8.9 9.1    GFR: Estimated Creatinine Clearance: 91 mL/min (by C-G formula based on SCr of 0.69 mg/dL).  Liver Function Tests: Recent Labs  Lab 10/13/22 0640 10/14/22 0908  AST 26 24  ALT 21 19  ALKPHOS 68 81  BILITOT 0.7 1.0  PROT 8.0 7.5  ALBUMIN 4.3 4.1   Recent Labs  Lab 10/13/22 0640 10/14/22 0908  LIPASE 25 20   No results for input(s): "AMMONIA" in the last 168 hours.  Coagulation Profile: No results for input(s): "INR", "PROTIME" in the last 168 hours.  Cardiac Enzymes: No results for input(s): "CKTOTAL", "CKMB", "CKMBINDEX", "TROPONINI" in the last 168 hours.  BNP (last 3 results) No results for input(s): "PROBNP" in the last 8760 hours.  Lipid Profile: No results for input(s): "CHOL", "HDL", "LDLCALC", "TRIG", "CHOLHDL", "LDLDIRECT" in the last 72 hours.  Thyroid Function Tests: No results for input(s): "TSH", "T4TOTAL", "FREET4", "T3FREE", "THYROIDAB" in the last 72 hours.  Anemia Panel: No results for input(s): "VITAMINB12", "FOLATE", "FERRITIN", "TIBC", "IRON", "RETICCTPCT" in the last 72 hours.  Urine analysis:    Component Value Date/Time   COLORURINE STRAW (A) 10/14/2022 0952   APPEARANCEUR CLEAR 10/14/2022 0952   LABSPEC 1.028 10/14/2022 0952   PHURINE 6.0 10/14/2022 0952   GLUCOSEU >=500 (A) 10/14/2022 0952   GLUCOSEU 250 (A) 09/21/2022 1011   HGBUR SMALL (A) 10/14/2022 0952   BILIRUBINUR NEGATIVE 10/14/2022 0952   BILIRUBINUR Negative 04/12/2022 1110    KETONESUR 80 (A) 10/14/2022 0952   PROTEINUR 100 (A) 10/14/2022 0952   UROBILINOGEN 0.2 09/21/2022 1011   NITRITE NEGATIVE 10/14/2022 0952   LEUKOCYTESUR NEGATIVE 10/14/2022 0952    Sepsis Labs: Lactic Acid, Venous    Component Value Date/Time   LATICACIDVEN 1.2 02/20/2019 0252    MICROBIOLOGY: No results found for this or any previous visit (from the past 240 hour(s)).  RADIOLOGY STUDIES/RESULTS: No results found.   LOS: 0 days   Jeoffrey Massed, MD  [facility]    To contact the attending provider between 7A-7P or the covering provider during after hours 7P-7A, please log into the web site [email or web address] and access using universal [facility] password for that web site. If you do not have the password, please  call the hospital operator.  10/15/2022, 8:51 AM

## 2022-10-15 NOTE — Progress Notes (Signed)
CT Abd neg for acute abnormalities Advance diet slowly Minimize narcotics Stop insulin gtt-semglee 12 units/SSI

## 2022-10-15 NOTE — Inpatient Diabetes Management (Addendum)
Inpatient Diabetes Program Recommendations  AACE/ADA: New Consensus Statement on Inpatient Glycemic Control (2015)  Target Ranges:  Prepandial:   less than 140 mg/dL      Peak postprandial:   less than 180 mg/dL (1-2 hours)      Critically ill patients:  140 - 180 mg/dL    Latest Reference Range & Units 10/15/22 02:51 10/15/22 04:08 10/15/22 05:14 10/15/22 06:13 10/15/22 08:04 10/15/22 09:42  Glucose-Capillary 70 - 99 mg/dL 409 (H) 811 (H) 914 (H) 167 (H) 127 (H) 120 (H)   Review of Glycemic Control  Diabetes history: DM1 (does NOT make any insulin; requires basal, correction, and carb coverage insulin) Outpatient Diabetes medications: T-Slim insulin pump with Novolog, Dexcom CGM Current orders for Inpatient glycemic control: IV insulin   Inpatient Diabetes Program Recommendations:     Insulin: Once provider is ready to transition from IV to SQ insulin, please consider ordering Semglee 15 units Q24H, CBGs Q4H, Novolog 0-9 units Q4H, and when diet ordered Novolog 3 units TID with meals for meal coverage if patient eats at least 50% of meals.   NOTE: Noted consult for Diabetes Coordinator. Diabetes Coordinator is not on campus over the weekend but available by pager from 8am to 5pm for questions or concerns. Patient was recently inpatient 09/04/22-09/08/22 (with DKA) and diabetes coordinator spoke with patient on 09/07/22. Per chart review, patient sees Dr. Lonzo Cloud [facility]) and was last seen on 06/08/22. Per office note on 06/08/22, patient uses T-Slim insulin pump and the following should be insulin pump settings:   Basal 0.7 units/hr Total basal: 16.8 units/day   Insulin to Carb ratio 1:1 (pt guesses at carbs so instructed to put in 4 grams for meals or 2 grams for snacks)   Insulin Sensitivity 1:45 mg/dl (1 unit drops glucose 45 mg/dl)   Per progress note by Dr. Jerral Ralph today, DKA has resolved and plan will be to maintain on insulin drip as patient is still nauseous and  complaining of LLQ pain. Per note, patient's insulin pump infusion site was likely cause of DKA and patient does not have extra pump supplies at bedside. We do not carry any insulin pump supplies at any campus. Patient is responsible to have extra pump supplies if patient is planned to transition back to her insulin pump. If patient is unable to get anyone to bring extra pump supplies to the hospital, she will need to transition to SQ insulin regimen when provider plans to transition off IV insulin.  Thanks, Orlando Penner, RN, MSN, CDCES Diabetes Coordinator Inpatient Diabetes Program [phone] (Team Pager from 8am to 5pm)

## 2022-10-16 DIAGNOSIS — E1010 Type 1 diabetes mellitus with ketoacidosis without coma: Secondary | ICD-10-CM | POA: Diagnosis not present

## 2022-10-16 DIAGNOSIS — E871 Hypo-osmolality and hyponatremia: Secondary | ICD-10-CM | POA: Diagnosis not present

## 2022-10-16 DIAGNOSIS — R1032 Left lower quadrant pain: Secondary | ICD-10-CM | POA: Diagnosis not present

## 2022-10-16 LAB — BASIC METABOLIC PANEL
Anion gap: 16 — ABNORMAL HIGH (ref 5–15)
BUN: <5 mg/dL — ABNORMAL LOW (ref 6–20)
CO2: 19 mmol/L — ABNORMAL LOW (ref 22–32)
Calcium: 8.8 mg/dL — ABNORMAL LOW (ref 8.9–10.3)
Chloride: 96 mmol/L — ABNORMAL LOW (ref 98–111)
Creatinine, Ser: 0.91 mg/dL (ref 0.44–1.00)
GFR, Estimated: >60 mL/min (ref >60)
Glucose, Bld: 333 mg/dL — ABNORMAL HIGH (ref 70–99)
Potassium: 3.1 mmol/L — ABNORMAL LOW (ref 3.5–5.1)
Sodium: 131 mmol/L — ABNORMAL LOW (ref 135–145)

## 2022-10-16 LAB — GLUCOSE, CAPILLARY
Glucose-Capillary: 307 mg/dL — ABNORMAL HIGH (ref 70–99)
Glucose-Capillary: 210 mg/dL — ABNORMAL HIGH (ref 70–99)

## 2022-10-16 LAB — MAGNESIUM: Magnesium: 1.6 mg/dL — ABNORMAL LOW (ref 1.7–2.4)

## 2022-10-16 MED ORDER — INSULIN ASPART 100 UNIT/ML IJ SOLN
12.0000 [IU] | Freq: Once | INTRAMUSCULAR | Status: AC
  Administered 2022-10-16: 12 [IU] via SUBCUTANEOUS

## 2022-10-16 MED ORDER — PANTOPRAZOLE SODIUM 40 MG PO TBEC: 40.0000 mg | DELAYED_RELEASE_TABLET | Freq: Two times a day (BID) | ORAL | Status: DC

## 2022-10-16 MED ORDER — INSULIN GLARGINE-YFGN 100 UNIT/ML ~~LOC~~ SOLN
18.0000 [IU] | Freq: Every day | SUBCUTANEOUS | Status: DC
  Administered 2022-10-16: 18 [IU] via SUBCUTANEOUS
  Filled 2022-10-16: qty 0.18

## 2022-10-16 MED ORDER — ONDANSETRON HCL 4 MG PO TABS: 4.0000 mg | ORAL_TABLET | ORAL | 0 refills | Status: DC | PRN

## 2022-10-16 MED ORDER — POTASSIUM CHLORIDE CRYS ER 20 MEQ PO TBCR
40.0000 meq | EXTENDED_RELEASE_TABLET | ORAL | Status: DC
  Administered 2022-10-16: 40 meq via ORAL
  Filled 2022-10-16 (×2): qty 2

## 2022-10-16 MED ORDER — INSULIN ASPART 100 UNIT/ML IJ SOLN
4.0000 [IU] | Freq: Three times a day (TID) | INTRAMUSCULAR | Status: DC
  Administered 2022-10-16: 4 [IU] via SUBCUTANEOUS

## 2022-10-16 MED ORDER — CYCLOBENZAPRINE HCL 5 MG PO TABS: 5.0000 mg | ORAL_TABLET | Freq: Three times a day (TID) | ORAL | 0 refills | Status: DC | PRN

## 2022-10-16 MED ORDER — MAGNESIUM SULFATE 4 GM/100ML IV SOLN
4.0000 g | Freq: Once | INTRAVENOUS | Status: AC
  Administered 2022-10-16: 4 g via INTRAVENOUS
  Filled 2022-10-16: qty 100

## 2022-10-16 NOTE — Inpatient Diabetes Management (Signed)
Inpatient Diabetes Program Recommendations  AACE/ADA: New Consensus Statement on Inpatient Glycemic Control (2015)  Target Ranges:  Prepandial:   less than 140 mg/dL      Peak postprandial:   less than 180 mg/dL (1-2 hours)      Critically ill patients:  140 - 180 mg/dL   Lab Results  Component Value Date   GLUCAP 210 (H) 10/16/2022   HGBA1C 7.6 (H) 09/05/2022    Review of Glycemic Control  Diabetes history: type 1 Outpatient Diabetes medications: insulin pump Current orders for Inpatient glycemic control: Semglee 18 units daily, Novolog 0-9 units TID, Novolog 4 units TID  Inpatient Diabetes Program Recommendations:   Spoke with patient about her insulin pump at home. Since patient could not get anyone to bring supplies, Semglee 18 units was given this am.   Patient was told to start her pump back when she gets home. Since she had the Semglee this am, she should stop the basal, cover her blood sugars and meals with bolus insulin for the rest of the day. Then in the am, start the basal insulin back and resume pump as usual. She has Novolog at home in a vial. She will ask her endocrinologist for back up basal insulin if needed. Suggested that she should always have SQ insulin available as backup.   Smith Mince RN BSN CDE Diabetes Coordinator Pager: [phone]  8am-5pm

## 2022-10-16 NOTE — Discharge Summary (Signed)
PATIENT DETAILS Name: Judy Cook Age: 22 y.o. Sex: female Date of Birth: June 10, 2000 MRN: [id number]. Admitting Physician: Clydie Braun, MD WUJ:WJXB, [place] Scrape, NP  Admit Date: 10/14/2022 Discharge date: 10/16/2022  Recommendations for Outpatient Follow-up:  Follow up with PCP in 1-2 weeks Please obtain CMP/CBC in one week  Admitted From:  Home  Disposition: Home   Discharge Condition: good  CODE STATUS:   Code Status: Full Code   Diet recommendation:  Diet Order             Diet full liquid Fluid consistency: Thin  Diet effective now           Diet Carb Modified                    Brief Summary: Patient is a 22 y.o.  female with history of DM-1 on insulin pump-admitted with 2-3-day history of LLQ abdominal pain associated nausea/vomiting-after a night out with friends drinking tequila.  She subsequently presented to the hospital with DKA and was admitted to the hospitalist service.   Significant events: 6/22>> admit to [facility]   Significant studies: 6/23>> CT abdomen/pelvis: No acute findings.   Significant microbiology data: None   Procedures: None   Consults: None  Brief Hospital Course: DKA Resolved with insulin drip/IV fluids.  It appears that the connector that connects the insulin pump to her abdominal wall has malfunctioned-and may be the reason for her DKA.    DM-1 (A1c 7.6 on 5/14) After DKA resolved-patient was transitioned to SQ insulin with Semglee/NovoLog-she unfortunately was unable to get a family member to bring her insulin pump connected to the hospital-she will be given 1 dose of Semglee here in the hospital-following which she will go home and put her insulin pump back on to cover her for meals.  She is to resume her usual basal regimen via insulin pump starting tomorrow.  This was all explained to her by this MD and by our diabetic coordinator at bedside.  She is to follow with the primary care practitioner/primary  endocrinologist for further continued care.     Nausea/vomiting/LLQ pain CT abdomen negative Suspect this was a muscle strain in the setting of nausea/vomiting Thankfully this has resolved with supportive care.     Hypokalemia/hypomagnesemia Replete prior to discharge-recheck at PCPs office.  BMI: Estimated body mass index is 27.34 kg/m as calculated from the following:   Height as of this encounter: 5' (1.524 m).   Weight as of this encounter: 63.5 kg.   Discharge Diagnoses:  Principal Problem:   DKA, type 1 (HCC) Active Problems:   Leukocytosis   Nausea and vomiting   Hypokalemia   Hypocalcemia   Hyponatremia   Left lower quadrant abdominal pain   GERD (gastroesophageal reflux disease)   Discharge Instructions:  Activity:  As tolerated    Discharge Instructions     Call MD for:  extreme fatigue   Complete by: As directed    Call MD for:  persistant dizziness or light-headedness   Complete by: As directed    Call MD for:  persistant nausea and vomiting   Complete by: As directed    Diet Carb Modified   Complete by: As directed    Discharge instructions   Complete by: As directed    Follow with Primary MD  Elenore Paddy, NP in 1-2 weeks  Please get a complete blood count and chemistry panel checked by your Primary MD at your next visit, and again  as instructed by your Primary MD.  Get Medicines reviewed and adjusted: Please take all your medications with you for your next visit with your Primary MD  Laboratory/radiological data: Please request your Primary MD to go over all hospital tests and procedure/radiological results at the follow up, please ask your Primary MD to get all Hospital records sent to his/her office.  In some cases, they will be blood work, cultures and biopsy results pending at the time of your discharge. Please request that your primary care M.D. follows up on these results.  Also Note the following: If you experience worsening of your  admission symptoms, develop shortness of breath, life threatening emergency, suicidal or homicidal thoughts you must seek medical attention immediately by calling 911 or calling your MD immediately  if symptoms less severe.  You must read complete instructions/literature along with all the possible adverse reactions/side effects for all the Medicines you take and that have been prescribed to you. Take any new Medicines after you have completely understood and accpet all the possible adverse reactions/side effects.   Do not drive when taking Pain medications or sleeping medications (Benzodaizepines)  Do not take more than prescribed Pain, Sleep and Anxiety Medications. It is not advisable to combine anxiety,sleep and pain medications without talking with your primary care practitioner  Special Instructions: If you have smoked or chewed Tobacco  in the last 2 yrs please stop smoking, stop any regular Alcohol  and or any Recreational drug use.  Wear Seat belts while driving.  Please note: You were cared for by a hospitalist during your hospital stay. Once you are discharged, your primary care physician will handle any further medical issues. Please note that NO REFILLS for any discharge medications will be authorized once you are discharged, as it is imperative that you return to your primary care physician (or establish a relationship with a primary care physician if you do not have one) for your post hospital discharge needs so that they can reassess your need for medications and monitor your lab values.   Increase activity slowly   Complete by: As directed       Allergies as of 10/16/2022   No Known Allergies      Medication List     TAKE these medications    cyclobenzaprine 5 MG tablet Commonly known as: FLEXERIL Take 1 tablet (5 mg total) by mouth 3 (three) times daily as needed for muscle spasms.   insulin aspart 100 UNIT/ML injection Commonly known as: NovoLOG Max Daily 60 units  per pump What changed:  how much to take how to take this when to take this additional instructions   naproxen sodium 220 MG tablet Commonly known as: ALEVE Take 440 mg by mouth daily as needed (pain).   norethindrone 0.35 MG tablet Commonly known as: MICRONOR Take 1 tablet (0.35 mg total) by mouth daily.   ondansetron 4 MG tablet Commonly known as: ZOFRAN Take 1 tablet (4 mg total) by mouth as needed for nausea or vomiting.        Follow-up Information     Elenore Paddy, NP. Schedule an appointment as soon as possible for a visit in 1 week(s).   Specialty: Nurse Practitioner Contact information: [address] [place] [address] [phone]                No Known Allergies   Other Procedures/Studies: CT ABDOMEN PELVIS W CONTRAST  Result Date: 10/15/2022 CLINICAL DATA:  Left lower quadrant abdominal  pain EXAM: CT ABDOMEN AND PELVIS WITH CONTRAST TECHNIQUE: Multidetector CT imaging of the abdomen and pelvis was performed using the standard protocol following bolus administration of intravenous contrast. RADIATION DOSE REDUCTION: This exam was performed according to the departmental dose-optimization program which includes automated exposure control, adjustment of the mA and/or kV according to patient size and/or use of iterative reconstruction technique. CONTRAST:  75mL OMNIPAQUE IOHEXOL 350 MG/ML SOLN COMPARISON:  09/04/2022 FINDINGS: Lower chest: Included lung bases are clear.  Heart size is normal. Hepatobiliary: No focal liver abnormality is seen. No gallstones, gallbladder wall thickening, or biliary dilatation. Pancreas: Unremarkable. No pancreatic ductal dilatation or surrounding inflammatory changes. Spleen: Normal in size without focal abnormality. Adrenals/Urinary Tract: Unremarkable adrenal glands. Kidneys enhance symmetrically without focal lesion, stone, or hydronephrosis. Ureters are nondilated. Urinary bladder appears unremarkable for the degree of  distention. Stomach/Bowel: Stomach is within normal limits. Appendix appears normal (series 3, image 44). No evidence of bowel wall thickening, distention, or inflammatory changes. Vascular/Lymphatic: No significant vascular findings are present. No enlarged abdominal or pelvic lymph nodes. Reproductive: Uterus and bilateral adnexa are unremarkable. Other: No free fluid. No abdominopelvic fluid collection. No pneumoperitoneum. No abdominal wall hernia. Musculoskeletal: Stranding and a small amount of air within the subcutaneous fat of the lower anterior abdominal wall, left of midline, most compatible with injection-related changes. No acute bony abnormality. IMPRESSION: 1. No acute abdominopelvic findings. 2. Presumed focal injection-related changes of the anterior abdominal wall. Correlate with history. Electronically Signed   By: Duanne Guess D.O.   On: 10/15/2022 15:21     TODAY-DAY OF DISCHARGE:  Subjective:   Judy Cook today has no headache,no chest abdominal pain,no new weakness tingling or numbness, feels much better wants to go home today.   Objective:   Blood pressure (!) 154/98, pulse 83, temperature 98 F (36.7 C), temperature source Oral, resp. rate 17, height 5' (1.524 m), weight 63.5 kg, last menstrual period 09/08/2022, SpO2 98 %.  Intake/Output Summary (Last 24 hours) at 10/16/2022 1038 Last data filed at 10/15/2022 2200 Gross per 24 hour  Intake 2311.28 ml  Output --  Net 2311.28 ml   Filed Weights   10/14/22 0902  Weight: 63.5 kg    Exam: Awake Alert, Oriented *3, No new F.N deficits, Normal affect Bruno.AT,PERRAL Supple Neck,No JVD, No cervical lymphadenopathy appriciated.  Symmetrical Chest wall movement, Good air movement bilaterally, CTAB RRR,No Gallops,Rubs or new Murmurs, No Parasternal Heave +ve B.Sounds, Abd Soft, Non tender, No organomegaly appriciated, No rebound -guarding or rigidity. No Cyanosis, Clubbing or edema, No new Rash or  bruise   PERTINENT RADIOLOGIC STUDIES: CT ABDOMEN PELVIS W CONTRAST  Result Date: 10/15/2022 CLINICAL DATA:  Left lower quadrant abdominal pain EXAM: CT ABDOMEN AND PELVIS WITH CONTRAST TECHNIQUE: Multidetector CT imaging of the abdomen and pelvis was performed using the standard protocol following bolus administration of intravenous contrast. RADIATION DOSE REDUCTION: This exam was performed according to the departmental dose-optimization program which includes automated exposure control, adjustment of the mA and/or kV according to patient size and/or use of iterative reconstruction technique. CONTRAST:  75mL OMNIPAQUE IOHEXOL 350 MG/ML SOLN COMPARISON:  09/04/2022 FINDINGS: Lower chest: Included lung bases are clear.  Heart size is normal. Hepatobiliary: No focal liver abnormality is seen. No gallstones, gallbladder wall thickening, or biliary dilatation. Pancreas: Unremarkable. No pancreatic ductal dilatation or surrounding inflammatory changes. Spleen: Normal in size without focal abnormality. Adrenals/Urinary Tract: Unremarkable adrenal glands. Kidneys enhance symmetrically without focal lesion, stone, or hydronephrosis. Ureters are nondilated.  Urinary bladder appears unremarkable for the degree of distention. Stomach/Bowel: Stomach is within normal limits. Appendix appears normal (series 3, image 44). No evidence of bowel wall thickening, distention, or inflammatory changes. Vascular/Lymphatic: No significant vascular findings are present. No enlarged abdominal or pelvic lymph nodes. Reproductive: Uterus and bilateral adnexa are unremarkable. Other: No free fluid. No abdominopelvic fluid collection. No pneumoperitoneum. No abdominal wall hernia. Musculoskeletal: Stranding and a small amount of air within the subcutaneous fat of the lower anterior abdominal wall, left of midline, most compatible with injection-related changes. No acute bony abnormality. IMPRESSION: 1. No acute abdominopelvic findings. 2.  Presumed focal injection-related changes of the anterior abdominal wall. Correlate with history. Electronically Signed   By: Duanne Guess D.O.   On: 10/15/2022 15:21     PERTINENT LAB RESULTS: CBC: Recent Labs    10/14/22 0908 10/14/22 0914 10/14/22 1304  WBC 13.4*  --   --   HGB 13.7 15.0 13.7  HCT 39.9 44.0 41.1  PLT 431*  --   --    CMET CMP     Component Value Date/Time   NA 131 (L) 10/16/2022 0417   K 3.1 (L) 10/16/2022 0417   CL 96 (L) 10/16/2022 0417   CO2 19 (L) 10/16/2022 0417   GLUCOSE 333 (H) 10/16/2022 0417   BUN <5 (L) 10/16/2022 0417   CREATININE 0.91 10/16/2022 0417   CALCIUM 8.8 (L) 10/16/2022 0417   PROT 7.5 10/14/2022 0908   ALBUMIN 4.1 10/14/2022 0908   AST 24 10/14/2022 0908   ALT 19 10/14/2022 0908   ALKPHOS 81 10/14/2022 0908   BILITOT 1.0 10/14/2022 0908   GFR 86.61 09/22/2022 1015   GFRNONAA >60 10/16/2022 0417    GFR Estimated Creatinine Clearance: 80 mL/min (by C-G formula based on SCr of 0.91 mg/dL). Recent Labs    10/14/22 0908  LIPASE 20   No results for input(s): "CKTOTAL", "CKMB", "CKMBINDEX", "TROPONINI" in the last 72 hours. Invalid input(s): "POCBNP" No results for input(s): "DDIMER" in the last 72 hours. No results for input(s): "HGBA1C" in the last 72 hours. No results for input(s): "CHOL", "HDL", "LDLCALC", "TRIG", "CHOLHDL", "LDLDIRECT" in the last 72 hours. No results for input(s): "TSH", "T4TOTAL", "T3FREE", "THYROIDAB" in the last 72 hours.  Invalid input(s): "FREET3" No results for input(s): "VITAMINB12", "FOLATE", "FERRITIN", "TIBC", "IRON", "RETICCTPCT" in the last 72 hours. Coags: No results for input(s): "INR" in the last 72 hours.  Invalid input(s): "PT" Microbiology: No results found for this or any previous visit (from the past 240 hour(s)).  FURTHER DISCHARGE INSTRUCTIONS:  Get Medicines reviewed and adjusted: Please take all your medications with you for your next visit with your Primary  MD  Laboratory/radiological data: Please request your Primary MD to go over all hospital tests and procedure/radiological results at the follow up, please ask your Primary MD to get all Hospital records sent to his/her office.  In some cases, they will be blood work, cultures and biopsy results pending at the time of your discharge. Please request that your primary care M.D. goes through all the records of your hospital data and follows up on these results.  Also Note the following: If you experience worsening of your admission symptoms, develop shortness of breath, life threatening emergency, suicidal or homicidal thoughts you must seek medical attention immediately by calling 911 or calling your MD immediately  if symptoms less severe.  You must read complete instructions/literature along with all the possible adverse reactions/side effects for all the Medicines you  take and that have been prescribed to you. Take any new Medicines after you have completely understood and accpet all the possible adverse reactions/side effects.   Do not drive when taking Pain medications or sleeping medications (Benzodaizepines)  Do not take more than prescribed Pain, Sleep and Anxiety Medications. It is not advisable to combine anxiety,sleep and pain medications without talking with your primary care practitioner  Special Instructions: If you have smoked or chewed Tobacco  in the last 2 yrs please stop smoking, stop any regular Alcohol  and or any Recreational drug use.  Wear Seat belts while driving.  Please note: You were cared for by a hospitalist during your hospital stay. Once you are discharged, your primary care physician will handle any further medical issues. Please note that NO REFILLS for any discharge medications will be authorized once you are discharged, as it is imperative that you return to your primary care physician (or establish a relationship with a primary care physician if you do not have  one) for your post hospital discharge needs so that they can reassess your need for medications and monitor your lab values.  Total Time spent coordinating discharge including counseling, education and face to face time equals greater than 30 minutes.  SignedJeoffrey Massed 10/16/2022 10:38 AM

## 2022-10-17 ENCOUNTER — Telehealth: Payer: Self-pay | Admitting: *Deleted

## 2022-10-17 NOTE — Telephone Encounter (Signed)
   Telephone encounter was:  Unsuccessful.  10/17/2022 Name: Terriona Horlacher MRN: [id number] DOB: 2000/12/19  Unsuccessful outbound call made today to assist with:  Food Insecurity  Outreach Attempt:  1st Attempt  Call cannot be  completed at this time   Alois Cliche -[facility] [place], Population Health [phone] [address] [place] , [place] [address] Email : [email or web address]

## 2022-10-18 ENCOUNTER — Encounter: Payer: Self-pay | Admitting: *Deleted

## 2022-10-18 ENCOUNTER — Telehealth: Payer: Self-pay | Admitting: *Deleted

## 2022-10-18 NOTE — Transitions of Care (Post Inpatient/ED Visit) (Signed)
10/18/2022  Name: Judy Cook MRN: [id number] DOB: 08/21/2000  Today's TOC FU Call Status: Today's TOC FU Call Status:: Successful TOC FU Call Competed TOC FU Call Complete Date: 10/18/22  Transition Care Management Follow-up Telephone Call Date of Discharge: 10/16/22 Discharge Facility: [facility]) Type of Discharge: Inpatient Admission Primary Inpatient Discharge Diagnosis:: DKA due to ETOH/ insulin pump connector malfunction/ abdominal pain/ nausea How have you been since you were released from the hospital?: Better ("I am much better; still have not yet got a new connector for my insulin pump but I am working on that now-- for now I am manually checking blood sugars and manually putting my insulin dose in the pump, and it is going okay") Any questions or concerns?: No  Items Reviewed: Did you receive and understand the discharge instructions provided?: Yes (thoroughly reviewed with patient who verbalizes good understanding of same) Medications obtained,verified, and reconciled?: Yes (Medications Reviewed) (Full medication reconciliation/ review completed; no concerns or discrepancies identified; confirmed patient obtained/ is taking all newly Rx'd medications as instructed; self-manages medications and denies questions/ concerns around medications today) Any new allergies since your discharge?: No Dietary orders reviewed?: Yes Type of Diet Ordered:: diabetic; "healthy" Do you have support at home?: Yes People in Home: friend(s) Name of Support/Comfort Primary Source: Reports independent in self-care activities; resides with roommate; supportive local family assists as/ if needed/ indicated  Medications Reviewed Today: Medications Reviewed Today     Reviewed by Michaela Corner, RN (Registered Nurse) on 10/18/22 at 1239  Med List Status: <None>   Medication Order Taking? Sig Documenting Provider Last Dose Status Informant  cyclobenzaprine (FLEXERIL) 5 MG tablet  [id number] Yes Take 1 tablet (5 mg total) by mouth 3 (three) times daily as needed for muscle spasms. Maretta Bees, MD Taking Active   insulin aspart (NOVOLOG) 100 UNIT/ML injection [id number] Yes Max Daily 60 units per pump  Patient taking differently: Inject 10 Units into the skin 3 (three) times daily with meals.   Shamleffer, Konrad Dolores, MD Taking Active Self, Pharmacy Records  naproxen sodium (ALEVE) 220 MG tablet [id number] Yes Take 440 mg by mouth daily as needed (pain). [provider] Taking Active Self, Pharmacy Records  norethindrone (MICRONOR) 0.35 MG tablet [id number] Yes Take 1 tablet (0.35 mg total) by mouth daily. Jerene Bears, MD Taking Active Self, Pharmacy Records  ondansetron [facility]) 4 MG tablet [id number] Yes Take 1 tablet (4 mg total) by mouth as needed for nausea or vomiting. Maretta Bees, MD Taking Active            Home Care and Equipment/Supplies: Were Home Health Services Ordered?: No Any new equipment or medical supplies ordered?: No  Functional Questionnaire: Do you need assistance with bathing/showering or dressing?: No Do you need assistance with meal preparation?: No Do you need assistance with eating?: No Do you have difficulty maintaining continence: No Do you need assistance with getting out of bed/getting out of a chair/moving?: No Do you have difficulty managing or taking your medications?: No  Follow up appointments reviewed: PCP Follow-up appointment confirmed?: Yes (care coordination outreach in real-time with scheduling care guide to successfully schedule hospital follow up PCP appointment 11/02/22- appoinment scheduled around patient preference) Date of PCP follow-up appointment?: 11/02/22 (verified this is recommended time frame for follow up per hospital discharging provider notes) Follow-up Provider: Covering provider for PCP- Hetty Blend NP Specialist Hospital Follow-up appointment confirmed?: NA (verified not  indicated per hospital discharging provider discharge notes) Do  you need transportation to your follow-up appointment?: No Do you understand care options if your condition(s) worsen?: Yes-patient verbalized understanding  SDOH Interventions Today    Flowsheet Row Most Recent Value  SDOH Interventions   Food Insecurity Interventions Other (Comment)  [confirmed [employer] Care guide referral placed,  confirmed care guide initiated outreach- encouraged patient to listen out for additional calls,  confirmed LCSW currently active in patient's care- call scheduled 10/19/22- encouraged to take call]  Transportation Interventions Intervention Not Indicated  [continues to drive self]      TOC Interventions Today    Flowsheet Row Most Recent Value  TOC Interventions   TOC Interventions Discussed/Reviewed TOC Interventions Discussed, Arranged PCP follow up less than 12 days/Care Guide scheduled      Interventions Today    Flowsheet Row Most Recent Value  Chronic Disease   Chronic disease during today's visit Diabetes, Other  [DKA/ abdominal pain,  insulin pump connector malfunction,  ETOH]  General Interventions   General Interventions Discussed/Reviewed General Interventions Discussed, Doctor Visits, Communication with, [facility]  Doctor Visits Discussed/Reviewed Doctor Visits Discussed, PCP, Specialist  [confirmed has scheduled endocrinology provider appointment 12/07/22,  arranged HFU OV with PCP for 11/02/22]  PCP/Specialist Visits Compliance with follow-up visit  Communication with RN, Social Work  [confirmed care coordination RN and LCSW both have scheduled calls on 10/19/22- encouraged patient to accept calls as scheduled and she verbalizes agreement]  Education Interventions   Education Provided Provided Education  Provided Verbal Education On [employer] of [employer]- encouraged patient to accept calls from [employer]  team]  Nutrition Interventions   Nutrition Discussed/Reviewed Nutrition Discussed  Pharmacy Interventions   Pharmacy Dicussed/Reviewed Pharmacy Topics Discussed  [Full medication review with updating medication list in EHR per patient report]      Caryl Pina, RN, BSN, CCRN Alumnus RN CM Care Coordination/ Transition of Care- [facility] Care Management [phone]: direct office

## 2022-10-19 ENCOUNTER — Ambulatory Visit: Payer: Self-pay

## 2022-10-19 ENCOUNTER — Ambulatory Visit: Payer: Self-pay | Admitting: Licensed Clinical Social Worker

## 2022-10-19 NOTE — Patient Instructions (Signed)
Social Work Visit Information  Thank you for taking time to visit with me today. Please don't hesitate to contact me if I can be of assistance to you.   Following are the goals we discussed today:   Goals Addressed             This Visit's Progress    connect with community support to manage health needs       Activities and task to complete in order to accomplish goals.   Call or go to Department of Social Services to apply for food benefits and Medicaid. Per your request I have e-mailed the information to you.  Also see below. Follow up on food resources discussed (see below) Apply for disability Keep all upcoming appointment discussed today Continue with compliance of taking medication prescribed by Doctor  Medicaid options : walk in at the at [facility], [address] [place], [address]  you can ask for  [employer] with Adult Medicaid   no appt needed Monday-Friday 8-5, closed for lunch 1230-130   Food options  [employer] ( you can download this app)    https://[email or web address].co.za         [address] of [address] [address], [place], [place], [address] [phone]  Call to schedule a time to pick up food  Definition [facility] [address] Wednesday 5HQ-4ON, Friday 11am-2pm, and Saturday 9am-12pm  [facility] Location: [address] [place], [address], [place] Time: 1st and 3rd Wednesday 9:00am-12:00noon. Contact info: [phone]  Can come once a month. Please arrive and sign in no later than 11:15am so everyone can be served by 12noon.  This is a drive through for pick up  Apply for foodstamps  at the department of Social Services or you can apply on line  [email or web address]   Apply for Disability  SOCIAL SECURITY ADMINSTRATION [address] [place], [place]  [address] [phone] / [phone]          Our next appointment is by telephone on 10/25/22 at 1:15   Please call the care guide team at [phone] if you need to cancel or reschedule your appointment.   If you or anyone you know are experiencing a Mental Health or Behavioral Health Crisis or need someone to talk to, please call the Suicide and Crisis Lifeline: 988 call the [place] National Suicide Prevention Lifeline: 934-789-9764 or TTY: (832)342-7647 TTY 419-198-5094) to talk to a trained counselor call 1-800-273-TALK (toll free, 24 hour hotline) go to [facility] [address], [place] [phone])   Patient verbalizes understanding of instructions and care plan provided today and agrees to view in MyChart. Active MyChart status and patient understanding of how to access instructions and care plan via MyChart confirmed with patient.      Sammuel Hines, LCSW Social Work Care Coordination  [facility] [employer] [employer] [phone]

## 2022-10-19 NOTE — Patient Instructions (Signed)
Visit Information  Thank you for taking time to visit with me today. Please don't hesitate to contact me if I can be of assistance to you.   Following are the goals we discussed today:  Continue to take medications as prescribed Continue to attend provider visits as scheduled Contact your provider with health questions or concerns as needed Continue to check blood sugars as recommended by your provider and notify provider if outside recommended range.   Our next appointment is by telephone on 11/09/22 at 2:00 pm  Please call the care guide team at [phone] if you need to cancel or reschedule your appointment.   If you are experiencing a Mental Health or Behavioral Health Crisis or need someone to talk to, please call the Suicide and Crisis Lifeline: 53  Kathyrn Sheriff, RN, MSN, BSN, CCM [facility] Care Coordinator [phone]

## 2022-10-19 NOTE — Patient Outreach (Signed)
  Care Coordination   Follow Up Visit Note   10/19/2022 Name: Judy Cook MRN: [id number] DOB: [birth year]  Judy Cook is a 22 y.o. year old female who sees Judy Paddy, NP for primary care. I spoke with  Judy Cook by phone today.  What matters to the patients health and wellness today?  Judy Cook reports, "I'm doing good". She states she is going to apply for Medicaid tomorrow. She states she has also called and made a sooner appointment with endocrinologist. She continues to check blood sugar and for the most part has been in usual range. She states she is going to go ahead and get insulin refilled prior to running low.  Goals Addressed             This Visit's Progress    continue to  improve post hospitalization       Interventions Today    Flowsheet Row Most Recent Value  Chronic Disease   Chronic disease during today's visit Diabetes  General Interventions   General Interventions Discussed/Reviewed General Interventions Reviewed  Doctor Visits Discussed/Reviewed Doctor Visits Reviewed  PCP/Specialist Visits Compliance with follow-up visit  [reviewed upcoming appointments. advised to attend appointments as scheduled]  Education Interventions   Education Provided Provided Education  Provided Verbal Education On Blood Sugar Monitoring  [advised to continue to check blood sugar as recommended by endocrinologist, advised regarding healthy eating]  Mental Health Interventions   Mental Health Discussed/Reviewed Other  [provided encouragement and positive feedback for steps toward self help.]  Pharmacy Interventions   Pharmacy Dicussed/Reviewed Pharmacy Topics Reviewed  Referral to Pharmacist --  [per review of chart, appointment with clinical pharmacist noted 10/30/22]            SDOH assessments and interventions completed:  No  Care Coordination Interventions:  Yes, provided   Follow up plan: Follow up call scheduled for 11/09/22    Encounter  Outcome:  Pt. Visit Completed   Judy Sheriff, RN, MSN, BSN, CCM [facility] Care Coordinator [phone]

## 2022-10-19 NOTE — Progress Notes (Signed)
   Care Guide Note  10/19/2022 Name: Anijah Spohr MRN: [id number] DOB: 2001-02-03  Referred by: Elenore Paddy, NP Reason for referral : Care Coordination (Outreach to schedule referral with Pharm d )   Judy Cook is a 22 y.o. year old female who is a primary care patient of Elenore Paddy, NP. Tyrene Nader was referred to the pharmacist for assistance related to DM.    Successful contact was made with the patient to discuss pharmacy services including being ready for the pharmacist to call at least 5 minutes before the scheduled appointment time, to have medication bottles and any blood sugar or blood pressure readings ready for review. The patient agreed to meet with the pharmacist via with the pharmacist via telephone visit on (date/time).  10/30/2022  Penne Lash, RMA Care Guide [facility]  [place], [place] [address] Direct Dial: [phone] [email or web address]

## 2022-10-19 NOTE — Patient Outreach (Signed)
  Care Coordination  Initial Visit Note   10/19/2022 Name: Judy Cook MRN: [id number] DOB: 01/07/2001  Judy Cook is a 21 y.o. year old female who sees Elenore Paddy, NP for primary care. I spoke with  Pincus Large by phone today.  What matters to the patients health and wellness today?  Being able to manager her health needs. Patient is experiencing stress related to not being able to afford medication and items needed to manage her health.    Goals Addressed             This Visit's Progress    connect with community support to manage health needs       Activities and task to complete in order to accomplish goals.   Call or go to Department of Social Services to apply for food benefits and Medicaid. Per your request I have e-mailed the information to you.  Also see below. Follow up on food resources discussed (see below) Apply for disability Keep all upcoming appointment discussed today Continue with compliance of taking medication prescribed by Doctor  Medicaid options : walk in at the at [facility], [address] [place], [address]  you can ask for  [employer] with Adult Medicaid   no appt needed Monday-Friday 8-5, closed for lunch 1230-130   Food options  [employer] ( you can download this app)    https://findfood.[employer].co.za         [address] of [address] [place] [address], [place], [place], [address] [phone]  Call to schedule a time to pick up food  Definition [facility] [address] Wednesday 3KZ-6WF, Friday 11am-2pm, and Saturday 9am-12pm  [facility] Location: [address] [place], [address], [place] Time: 1st and 3rd Wednesday 9:00am-12:00noon. Contact info: [phone]  Can come once a month. Please arrive and sign in no later than 11:15am so everyone can be served by 12noon.  This is a drive through for pick up  Apply for foodstamps  at the department of Social  Services or you can apply on line  [email or web address]   Apply for Disability  SOCIAL SECURITY ADMINSTRATION [address] [place], [place] [address] [phone] / [phone]         SDOH assessments and interventions completed:  Yes  SDOH Interventions Today    Flowsheet Row Most Recent Value  SDOH Interventions   Food Insecurity Interventions Other (Comment)  [provided community food resources and discussed applying for food benefits at DSS]  Financial Strain Interventions Other (Comment)  [discussed options]  Stress Interventions Provide Counseling       Care Coordination Interventions:  Yes, provided  Interventions Today    Flowsheet Row Most Recent Value  Chronic Disease   Chronic disease during today's visit Diabetes  General Interventions   General Interventions Discussed/Reviewed General Interventions Reviewed, Communication with  Communication with RN  Education Interventions   Education Provided Provided Education  Provided Verbal Education On Mental Health/Coping with Illness, Programmer, applications, Development worker, community  Mental Health Interventions   Mental Health Discussed/Reviewed Coping Strategies  [stress]  Nutrition Interventions   Nutrition Discussed/Reviewed Nutrition Reviewed  [provided health resource options]       Follow up plan: Follow up call scheduled for 10/25/22    Encounter Outcome:  Pt. Visit Completed   Sammuel Hines, LCSW Social Work Care Coordination  [facility] [employer] [employer] [phone]

## 2022-10-20 ENCOUNTER — Encounter (HOSPITAL_COMMUNITY): Payer: Self-pay

## 2022-10-20 ENCOUNTER — Ambulatory Visit (HOSPITAL_COMMUNITY)
Admission: EM | Admit: 2022-10-20 | Discharge: 2022-10-20 | Disposition: A | Discharge status: Home / Self Care | Payer: Federal, State, Local not specified - PPO | Attending: Family Medicine | Admitting: Family Medicine

## 2022-10-20 DIAGNOSIS — K5900 Constipation, unspecified: Secondary | ICD-10-CM | POA: Diagnosis not present

## 2022-10-20 DIAGNOSIS — R222 Localized swelling, mass and lump, trunk: Secondary | ICD-10-CM | POA: Diagnosis not present

## 2022-10-20 NOTE — ED Provider Notes (Signed)
[facility]    CSN: [id number] Arrival date & time: 10/20/22  1212      History   Chief Complaint Chief Complaint  Patient presents with   Cyst    HPI Judy Cook is a 22 y.o. female.   HPI Here for a lump she noted today in her left abdomen wall near her umbilicus. No pain or tenderness there. Appetite normal and no n/v. She has been constipated; had a small BM yesterday.   No f/c.  Recently admitted 6/21-6/24 and CT abd/pelvis then was benign without mass  She wears an insulin pump, and it was on the left side of her abdomen recently.  Past Medical History:  Diagnosis Date   Allergic rhinitis 07/07/2021   COVID 2020   mild   Diabetes mellitus (HCC)    Type 1, type 5   Dysmenorrhea 02/20/2019   GERD (gastroesophageal reflux disease)    Headache    History of herpes genitalis 06/24/2021   Intractable vomiting    SIRS (systemic inflammatory response syndrome) (HCC) 12/03/2019    Patient Active Problem List   Diagnosis Date Noted   DKA, type 1 (HCC) 10/14/2022   Leukocytosis 10/14/2022   Hypocalcemia 10/14/2022   Hyponatremia 10/14/2022   Left lower quadrant abdominal pain 10/14/2022   GERD (gastroesophageal reflux disease) 10/14/2022   Colitis 09/21/2022   H/O diabetic ketoacidosis 09/04/2022   Rh negative state in antepartum period 07/05/2022   Constipation 07/07/2021   Nausea and vomiting 12/06/2019   Marijuana use 02/20/2019   Cyclical vomiting 02/20/2019   Hypokalemia 02/20/2019   Hyperglycemia due to type 1 diabetes mellitus (HCC) 02/19/2019   Type 1 diabetes mellitus without complication (HCC) 02/26/2012    Past Surgical History:  Procedure Laterality Date   DILATION AND EVACUATION N/A 07/05/2022   Procedure: DILATATION AND EVACUATION;  Surgeon: [place] Bing, MD;  Location: [facility] OR;  Service: Gynecology;  Laterality: N/A;   OPERATIVE ULTRASOUND N/A 07/05/2022   Procedure: OPERATIVE ULTRASOUND;  Surgeon: Irving Bing,  MD;  Location: [facility] OR;  Service: Gynecology;  Laterality: N/A;    OB History     Gravida  1   Para  0   Term  0   Preterm  0   AB  1   Living  0      SAB  0   IAB  1   Ectopic  0   Multiple  0   Live Births  0            Home Medications    Prior to Admission medications   Medication Sig Start Date End Date Taking? Authorizing Provider  insulin aspart (NOVOLOG) 100 UNIT/ML injection Max Daily 60 units per pump Patient taking differently: Inject 10 Units into the skin 3 (three) times daily with meals. 06/08/22  Yes Shamleffer, Konrad Dolores, MD  cyclobenzaprine (FLEXERIL) 5 MG tablet Take 1 tablet (5 mg total) by mouth 3 (three) times daily as needed for muscle spasms. 10/16/22   Ghimire, Werner Lean, MD  naproxen sodium (ALEVE) 220 MG tablet Take 440 mg by mouth daily as needed (pain).    [provider]  norethindrone (MICRONOR) 0.35 MG tablet Take 1 tablet (0.35 mg total) by mouth daily. 09/25/22   Jerene Bears, MD  ondansetron (ZOFRAN) 4 MG tablet Take 1 tablet (4 mg total) by mouth as needed for nausea or vomiting. 10/16/22   Ghimire, Werner Lean, MD    Family History Family History  Problem Relation  Age of Onset   Diabetes Paternal Grandfather    Heart attack Paternal Grandfather    Diabetes Paternal Grandmother    Cancer Maternal Grandmother        lung   Alcohol abuse Maternal Grandfather    Alcohol abuse Father    Drug abuse Father    High Cholesterol Father    Alcohol abuse Mother    Depression Mother    Drug abuse Mother     Social History Social History   Tobacco Use   Smoking status: Some Days    Types: Cigarettes   Smokeless tobacco: Never  Vaping Use   Vaping Use: Former   Substances: Nicotine, THC  Substance Use Topics   Alcohol use: Yes    Comment: socially   Drug use: Yes    Types: Marijuana     Allergies   Patient has no known allergies.   Review of Systems Review of Systems   Physical Exam Triage Vital  Signs ED Triage Vitals [10/20/22 1253]  Enc Vitals Group     BP 130/86     Pulse Rate (!) 102     Resp 18     Temp 98.6 F (37 C)     Temp Source Oral     SpO2 98 %     Weight      Height      Head Circumference      Peak Flow      Pain Score      Pain Loc      Pain Edu?      Excl. in GC?    No data found.  Updated Vital Signs BP 130/86 (BP Location: Left Arm)   Pulse (!) 102   Temp 98.6 F (37 C) (Oral)   Resp 18   LMP 09/08/2022 (Exact Date)   SpO2 98%   Visual Acuity Right Eye Distance:   Left Eye Distance:   Bilateral Distance:    Right Eye Near:   Left Eye Near:    Bilateral Near:     Physical Exam Vitals reviewed.  Constitutional:      General: She is not in acute distress.    Appearance: She is not ill-appearing, toxic-appearing or diaphoretic.  HENT:     Mouth/Throat:     Mouth: Mucous membranes are moist.  Eyes:     Extraocular Movements: Extraocular movements intact.     Conjunctiva/sclera: Conjunctivae normal.     Pupils: Pupils are equal, round, and reactive to light.  Abdominal:     Palpations: Abdomen is soft.     Comments: There is a subQ nodule, about 3 cm diameter in the abdominal wall left of the umbilicus. Nontender, and no overlying erythema or other skin changes.  Musculoskeletal:     Cervical back: Neck supple.  Lymphadenopathy:     Cervical: No cervical adenopathy.  Skin:    Coloration: Skin is not pale.  Neurological:     Mental Status: She is alert and oriented to person, place, and time.  Psychiatric:        Behavior: Behavior normal.      UC Treatments / Results  Labs (all labs ordered are listed, but only abnormal results are displayed) Labs Reviewed - No data to display  EKG   Radiology No results found.  Procedures Procedures (including critical care time)  Medications Ordered in UC Medications - No data to display  Initial Impression / Assessment and Plan / UC Course  I have reviewed  the triage vital  signs and the nursing notes.  Pertinent labs & imaging results that were available during my care of the patient were reviewed by me and considered in my medical decision making (see chart for details).        Discussed with her that I do think this is possibly hematoma/due to the insulin pump.   I have asked her to keep an eye on it; if enlarging, she can go to the ER for evaluation, for possible imaging.  If just not going away, she can let her internist evaluate this when she sees them on 7/11  She will try colace or miralax for the constipation. Final Clinical Impressions(s) / UC Diagnoses   Final diagnoses:  Subcutaneous nodule of abdominal wall  Constipation, unspecified constipation type     Discharge Instructions      You can try taking Colace/docusate sodium 100 mg--2 daily (it's a stool softener)  Or you can take miralax daily, according to the instructions on the bottle.  If the knot in your abdominal wall enlarges, go to the ER for further evaluation  If it doesn't go away, make sure you speak to your internist about it when you see them on 7/11.     ED Prescriptions   None    PDMP not reviewed this encounter.   Zenia Resides, MD 10/20/22 [phone]

## 2022-10-20 NOTE — Discharge Instructions (Signed)
You can try taking Colace/docusate sodium 100 mg--2 daily (it's a stool softener)  Or you can take miralax daily, according to the instructions on the bottle.  If the knot in your abdominal wall enlarges, go to the ER for further evaluation  If it doesn't go away, make sure you speak to your internist about it when you see them on 7/11.

## 2022-10-20 NOTE — ED Triage Notes (Signed)
Pt is here for a knot left side of her stomach. Pt does take insulin daily.

## 2022-10-23 ENCOUNTER — Telehealth: Payer: Self-pay | Admitting: *Deleted

## 2022-10-23 NOTE — Telephone Encounter (Signed)
   Telephone encounter was:  Unsuccessful.  10/23/2022 Name: Judy Cook MRN: [id number] DOB: 05/09/2000  Unsuccessful outbound call made today to assist with:  Food Insecurity  Outreach Attempt:  2nd Attempt  A HIPAA compliant voice message was left requesting a return call.  Instructed patient to call back at [phone]. Yehuda Mao Greenauer -[facility] , Population Health [phone] [address] [place] , [place] [address] Email : [email or web address]

## 2022-10-25 ENCOUNTER — Ambulatory Visit: Payer: Self-pay | Admitting: Licensed Clinical Social Worker

## 2022-10-25 NOTE — Patient Instructions (Signed)
Social Work Visit Information  Thank you for taking time to visit with me today. Please don't hesitate to contact me if I can be of assistance to you.   Following are the goals we discussed today:   Goals Addressed             This Visit's Progress    connect with community resources to manage health needs       Activities and task to complete in order to accomplish goals.   Follow up on your  food benefits and Medicaid.  Apply for disability Keep all upcoming appointment discussed today Continue with compliance of taking medication prescribed by Doctor Continue to utilize the Food options we discussed below [employer] ( you can download this app)    https://findfood.[employer].co.za         [address] of [address] [place], [place], [address] [phone]  Call to schedule a time to pick up food  Definition [facility] [address] Wednesday 9JY-7WG, Friday 11am-2pm, and Saturday 9am-12pm  [facility] Location: [address] [place], [address], [place] Time: 1st and 3rd Wednesday 9:00am-12:00noon. Contact info: [phone]  Can come once a month. Please arrive and sign in no later than 11:15am so everyone can be served by 12noon.  This is a drive through for pick up  Apply for foodstamps  at the department of Social Services or you can apply on line  [email or web address]   Apply for Disability  SOCIAL SECURITY ADMINSTRATION [address] [place], [place] [address] [phone] / [phone]          Our next appointment is by telephone on 11/25/22 at 2:00   Please call the care guide team at [phone] if you need to cancel or reschedule your appointment.   If you or anyone you know are experiencing a Mental Health or Behavioral Health Crisis or need someone  to talk to, please call the Suicide and Crisis Lifeline: 988 call the [place] National Suicide Prevention Lifeline: 646-804-8636 or TTY: 4387273575 TTY 207-610-7536) to talk to a trained counselor call 1-800-273-TALK (toll free, 24 hour hotline) go to [facility] [address], [place] [phone])   Patient verbalizes understanding of instructions and care plan provided today and agrees to view in MyChart. Active MyChart status and patient understanding of how to access instructions and care plan via MyChart confirmed with patient.      Sammuel Hines, LCSW Social Work Care Coordination  [facility] [employer] [employer] [phone]

## 2022-10-25 NOTE — Patient Outreach (Signed)
  Care Coordination  Follow Up Visit Note   10/25/2022 Name: Symphony Criado MRN: [id number] DOB: 2000/08/07  Darlis Follett is a 22 y.o. year old female who sees Elenore Paddy, NP for primary care. I spoke with  Pincus Large by phone today.  What matters to the patients health and wellness today?  Navigating community resources  Patient is making progress with community resources. She has visited food options provided and has started the process for Medicaid. Unable to complete Medicaid application without contacting DSS in hometown. Per patient,  she understands what she needs to do and will continue to work on [employer].   Goals Addressed             This Visit's Progress    connect with community resources to manage health needs       Activities and task to complete in order to accomplish goals.   Follow up on your  food benefits and Medicaid.  Apply for disability Keep all upcoming appointment discussed today Continue with compliance of taking medication prescribed by Doctor Continue to utilize the Food options we discussed below [employer] ( you can download this app)    https://findfood.[employer].co.za         [address] of [address], [place], [place], [address] [phone]  Call to schedule a time to pick up food  Definition [facility] [address] Wednesday 6LO-7FI, Friday 11am-2pm, and Saturday 9am-12pm  [facility] Location: [address] [place], [address], [place] Time: 1st and 3rd Wednesday 9:00am-12:00noon. Contact info: [phone]  Can come once a month. Please arrive and sign in no later than 11:15am so everyone can be served by 12noon.  This is a drive through for pick up  Apply for foodstamps  at the department of Social Services or you can apply on line   [email or web address]-[email or web address]   Apply for Disability  SOCIAL SECURITY ADMINSTRATION [address] [place], [place] [address] [phone] / [phone]         SDOH assessments and interventions completed:  No   Care Coordination Interventions:  Yes, provided  Interventions Today    Flowsheet Row Most Recent Value  Chronic Disease   Chronic disease during today's visit Diabetes  General Interventions   General Interventions Discussed/Reviewed General Interventions Reviewed, Psychiatrist Interventions   Provided Verbal Education On [employer], Youth worker to apply for medicaid will continue to work on this]  Nutrition Interventions   Nutrition Discussed/Reviewed Nutrition Reviewed  Rene Kocher able to go pick up food from resources provided]       Follow up plan: Follow up call scheduled for 60 days.  Patient states will call if needed prior to next encounter.    Encounter Outcome:  Pt. Visit Completed   Sammuel Hines, LCSW Social Work Care Coordination  [facility] [employer] [phone]

## 2022-10-27 ENCOUNTER — Telehealth: Payer: Self-pay

## 2022-10-27 MED ORDER — DEXCOM G7 SENSOR MISC: 0 refills | Status: DC

## 2022-10-27 NOTE — Telephone Encounter (Signed)
Patient called wanting to know if we could send in a new script for Dexcom G7. She is currently on a G6.   Please Advise in Shamleffer absence.

## 2022-10-27 NOTE — Telephone Encounter (Signed)
RX sent. Pt notified.  

## 2022-10-28 DIAGNOSIS — E663 Overweight: Secondary | ICD-10-CM | POA: Diagnosis not present

## 2022-10-28 DIAGNOSIS — Z794 Long term (current) use of insulin: Secondary | ICD-10-CM | POA: Diagnosis not present

## 2022-10-28 DIAGNOSIS — E1010 Type 1 diabetes mellitus with ketoacidosis without coma: Secondary | ICD-10-CM | POA: Diagnosis not present

## 2022-10-28 DIAGNOSIS — Z9641 Presence of insulin pump (external) (internal): Secondary | ICD-10-CM | POA: Diagnosis not present

## 2022-10-28 DIAGNOSIS — Z6827 Body mass index (BMI) 27.0-27.9, adult: Secondary | ICD-10-CM | POA: Diagnosis not present

## 2022-10-28 DIAGNOSIS — E871 Hypo-osmolality and hyponatremia: Secondary | ICD-10-CM | POA: Diagnosis not present

## 2022-10-28 DIAGNOSIS — R1110 Vomiting, unspecified: Secondary | ICD-10-CM | POA: Diagnosis not present

## 2022-10-29 DIAGNOSIS — E1010 Type 1 diabetes mellitus with ketoacidosis without coma: Secondary | ICD-10-CM | POA: Diagnosis not present

## 2022-10-30 ENCOUNTER — Telehealth: Payer: Self-pay | Admitting: *Deleted

## 2022-10-30 ENCOUNTER — Other Ambulatory Visit: Payer: Federal, State, Local not specified - PPO

## 2022-10-30 NOTE — Progress Notes (Signed)
   10/30/2022  Patient ID: Pincus Large, female   DOB: 06/21/2000, 22 y.o.   MRN: [id number]  S/O Telephone visit to assist with affordability of Dexcom G7 in response to referral placed by social work  Medication Access/Adherence -Patient has T1DM diagnosis and uses an insulin pump -She is prescribed Dexcom G7 for CGM, but had not been able to pick sensors up due to cost.  She has now picked up a 1 month supply, but this did cost her $170.42 -She is unsure if she has a deductible to meet with her [employer] plan -She is followed by Dr. Lonzo Cloud with endocrinology  A/P  Medication Access/Adherence -Contacted insurance plan, and what the patient paid for the Dexcom G7 sensors is her copay for that prescription -[employer] plan representative did state that [place] 3 would be $22 each month.  This is substantially cheaper, but Dr. Lonzo Cloud may have selected Dexcom for a particular reason.  I will inform Dr. Lonzo Cloud and patient, so they can collaborate on plan moving forward.  Lenna Gilford, PharmD, DPLA

## 2022-10-30 NOTE — Telephone Encounter (Signed)
   Telephone encounter was:  Successful.  10/30/2022 Name: Judy Cook MRN: [id number] DOB: 02-04-2001  Judy Cook is a 22 y.o. year old female who is a primary care patient of Elenore Paddy, NP . The community resource team was consulted for assistance with Food Insecurity  Care guide performed the following interventions: Patient provided with information about care guide support team and interviewed to confirm resource needs. Answered questions regarding food stamp application and also provided food banks resources   Follow Up Plan:  No further follow up planned at this time. The patient has been provided with needed resources.  Yehuda Mao Greenauer -[facility], Population Health [phone] [address] [place] , [place] [address] Email : [email or web address]

## 2022-11-01 ENCOUNTER — Telehealth: Payer: Self-pay | Admitting: *Deleted

## 2022-11-01 ENCOUNTER — Inpatient Hospital Stay: Payer: Federal, State, Local not specified - PPO | Admitting: Hematology and Oncology

## 2022-11-01 ENCOUNTER — Inpatient Hospital Stay: Payer: Federal, State, Local not specified - PPO

## 2022-11-01 DIAGNOSIS — E1065 Type 1 diabetes mellitus with hyperglycemia: Secondary | ICD-10-CM | POA: Diagnosis not present

## 2022-11-01 NOTE — Telephone Encounter (Signed)
TCT patient regarding her appt today with Dr. Leonides Schanz. Spoke with her. Advised that she does not need to come to this appt. She was referred to [place] for elevated platelet count but since the time of her referral, her platelet count has normalized. Pt voiced understanding.  Appt cancelled for today.

## 2022-11-02 ENCOUNTER — Encounter: Payer: Self-pay | Admitting: Family Medicine

## 2022-11-02 ENCOUNTER — Ambulatory Visit (INDEPENDENT_AMBULATORY_CARE_PROVIDER_SITE_OTHER): Payer: Federal, State, Local not specified - PPO | Admitting: Family Medicine

## 2022-11-02 VITALS — BP 128/84 | HR 108 | Temp 97.6°F | Ht 60.0 in | Wt 140.0 lb

## 2022-11-02 DIAGNOSIS — Z794 Long term (current) use of insulin: Secondary | ICD-10-CM

## 2022-11-02 DIAGNOSIS — E871 Hypo-osmolality and hyponatremia: Secondary | ICD-10-CM | POA: Diagnosis not present

## 2022-11-02 DIAGNOSIS — E1010 Type 1 diabetes mellitus with ketoacidosis without coma: Secondary | ICD-10-CM | POA: Insufficient documentation

## 2022-11-02 DIAGNOSIS — Z09 Encounter for follow-up examination after completed treatment for conditions other than malignant neoplasm: Secondary | ICD-10-CM | POA: Insufficient documentation

## 2022-11-02 LAB — COMPREHENSIVE METABOLIC PANEL
ALT: 13 U/L (ref 0–35)
AST: 16 U/L (ref 0–37)
Albumin: 4.5 g/dL (ref 3.5–5.2)
Alkaline Phosphatase: 72 U/L (ref 39–117)
BUN: 12 mg/dL (ref 6–23)
CO2: 27 mEq/L (ref 19–32)
Calcium: 10.2 mg/dL (ref 8.4–10.5)
Chloride: 101 mEq/L (ref 96–112)
Creatinine, Ser: 0.85 mg/dL (ref 0.40–1.20)
GFR: 96.4 mL/min (ref >60.00)
Glucose, Bld: 106 mg/dL — ABNORMAL HIGH (ref 70–99)
Potassium: 4.1 mEq/L (ref 3.5–5.1)
Sodium: 133 mEq/L — ABNORMAL LOW (ref 135–145)
Total Bilirubin: 0.4 mg/dL (ref 0.2–1.2)
Total Protein: 7.4 g/dL (ref 6.0–8.3)

## 2022-11-02 MED ORDER — ONDANSETRON HCL 4 MG PO TABS: 4.0000 mg | ORAL_TABLET | ORAL | 0 refills | Status: DC | PRN

## 2022-11-02 NOTE — Patient Instructions (Signed)
Please go downstairs for labs before you leave.  Continue keeping a close eye on your blood sugar.  Take your insulin as prescribed.  Make sure you are eating a healthy diet.  Stay well-hydrated.  Limit alcohol  Follow-up with your endocrinologist as scheduled this month.

## 2022-11-02 NOTE — Progress Notes (Signed)
Subjective:     Patient ID: Judy Cook, female    DOB: 2001-04-13, 22 y.o.   MRN: [id number]  Chief Complaint  Patient presents with   Hospitalization Follow-up    HPI  Discussed the use of AI scribe software for clinical note transcription with the patient, who gave verbal consent to proceed.  History of Present Illness         Hospital discharge follow up.   States her last hospital admission for DKA was related to her having too much alcohol and not monitoring her blood sugars.   She has a Dexcom now. Taking insulin as prescribed. Appetite is poor.   Denies addiction to alcohol.   Last A1c 8.7%   She sees endocrinology for DM.   Is not currently working or in school.  Lives with a roommate.  No kids.   On OCPs.       Health Maintenance Due  Topic Date Due   OPHTHALMOLOGY EXAM  Never done   HPV VACCINES (1 - 3-dose series) Never done   DTaP/Tdap/Td (1 - Tdap) Never done   Diabetic kidney evaluation - Urine ACR  06/02/2022    Past Medical History:  Diagnosis Date   Allergic rhinitis 07/07/2021   COVID 2020   mild   Diabetes mellitus (HCC)    Type 1, type 5   Dysmenorrhea 02/20/2019   GERD (gastroesophageal reflux disease)    Headache    History of herpes genitalis 06/24/2021   Intractable vomiting    SIRS (systemic inflammatory response syndrome) (HCC) 12/03/2019    Past Surgical History:  Procedure Laterality Date   DILATION AND EVACUATION N/A 07/05/2022   Procedure: DILATATION AND EVACUATION;  Surgeon: [place] Bing, MD;  Location: [facility] OR;  Service: Gynecology;  Laterality: N/A;   OPERATIVE ULTRASOUND N/A 07/05/2022   Procedure: OPERATIVE ULTRASOUND;  Surgeon: Cozad Bing, MD;  Location: [facility] OR;  Service: Gynecology;  Laterality: N/A;    Family History  Problem Relation Age of Onset   Diabetes Paternal Grandfather    Heart attack Paternal Grandfather    Diabetes Paternal Grandmother    Cancer Maternal Grandmother         lung   Alcohol abuse Maternal Grandfather    Alcohol abuse Father    Drug abuse Father    High Cholesterol Father    Alcohol abuse Mother    Depression Mother    Drug abuse Mother     Social History   Socioeconomic History   Marital status: Single    Spouse name: Not on file   Number of children: Not on file   Years of education: Not on file   Highest education level: Some college, no degree  Occupational History   Not on file  Tobacco Use   Smoking status: Some Days    Types: Cigarettes   Smokeless tobacco: Never  Vaping Use   Vaping status: Former   Substances: Nicotine, THC  Substance and Sexual Activity   Alcohol use: Yes    Comment: socially   Drug use: Yes    Types: Marijuana   Sexual activity: Yes    Partners: Male    Birth control/protection: Pill  Other Topics Concern   Not on file  Social History Narrative   Not on file   Social Determinants of Health   Financial Resource Strain: Medium Risk (10/19/2022)   Overall Financial Resource Strain (CARDIA)    Difficulty of Paying Living Expenses: Somewhat hard  Food Insecurity: Food Insecurity  Present (10/30/2022)   Hunger Vital Sign    Worried About Running Out of Food in the Last Year: Sometimes true    Ran Out of Food in the Last Year: Sometimes true  Transportation Needs: No Transportation Needs (10/28/2022)   Received from [facility], [facility]   PRAPARE - Transportation    Lack of Transportation (Medical): No    Lack of Transportation (Non-Medical): No  Physical Activity: Insufficiently Active (09/20/2022)   Exercise Vital Sign    Days of Exercise per Week: 2 days    Minutes of Exercise per Session: 20 min  Stress: No Stress Concern Present (10/28/2022)   Received from [facility], [facility] of Occupational Health - Occupational Stress Questionnaire    Feeling of Stress : Only a little  Recent Concern: Stress - Stress Concern Present (10/19/2022)   [employer] of  Occupational Health - Occupational Stress Questionnaire    Feeling of Stress : To some extent  Social Connections: Unknown (10/30/2022)   Received from [facility], [facility]   Social Network    Social Network: Not on file  Intimate Partner Violence: Not At Risk (10/28/2022)   Received from [facility], [facility]   HITS    Over the last 12 months how often did your partner physically hurt you?: 1    Over the last 12 months how often did your partner insult you or talk down to you?: 1    Over the last 12 months how often did your partner threaten you with physical harm?: 1    Over the last 12 months how often did your partner scream or curse at you?: 1    Outpatient Medications Prior to Visit  Medication Sig Dispense Refill   Continuous Glucose Sensor (DEXCOM G7 SENSOR) MISC Use to check blood glucose continuously 3 each 0   cyclobenzaprine (FLEXERIL) 5 MG tablet Take 1 tablet (5 mg total) by mouth 3 (three) times daily as needed for muscle spasms. 15 tablet 0   insulin aspart (NOVOLOG) 100 UNIT/ML injection Max Daily 60 units per pump (Patient taking differently: Inject 10 Units into the skin 3 (three) times daily with meals.) 60 mL 11   naproxen sodium (ALEVE) 220 MG tablet Take 440 mg by mouth daily as needed (pain).     norethindrone (MICRONOR) 0.35 MG tablet Take 1 tablet (0.35 mg total) by mouth daily. 84 tablet 1   ondansetron (ZOFRAN) 4 MG tablet Take 1 tablet (4 mg total) by mouth as needed for nausea or vomiting. 10 tablet 0   No facility-administered medications prior to visit.    No Known Allergies  Review of Systems  Constitutional:  Negative for chills and fever.  Eyes:  Negative for blurred vision and double vision.  Respiratory:  Negative for shortness of breath.   Cardiovascular:  Negative for chest pain and palpitations.  Gastrointestinal:  Negative for abdominal pain, constipation, diarrhea, nausea and vomiting.  Genitourinary:  Negative for dysuria,  frequency and urgency.  Neurological:  Negative for dizziness and weakness.  Psychiatric/Behavioral:  Negative for depression. The patient is not nervous/anxious.        Objective:    Physical Exam Constitutional:      General: She is not in acute distress.    Appearance: She is not ill-appearing.  Eyes:     Extraocular Movements: Extraocular movements intact.     Conjunctiva/sclera: Conjunctivae normal.  Cardiovascular:     Rate and Rhythm: Normal rate.  Pulmonary:     Effort: Pulmonary effort is normal.  Musculoskeletal:     Cervical back: Normal range of motion and neck supple.  Skin:    General: Skin is warm and dry.  Neurological:     General: No focal deficit present.     Mental Status: She is alert and oriented to person, place, and time.  Psychiatric:        Mood and Affect: Mood normal.        Behavior: Behavior normal.        Thought Content: Thought content normal.      BP 128/84 (BP Location: Left Arm, Patient Position: Sitting, Cuff Size: Cook)   Pulse (!) 108   Temp 97.6 F (36.4 C) (Temporal)   Ht 5' (1.524 m)   Wt 140 lb (63.5 kg)   LMP 09/08/2022 (Exact Date)   SpO2 99%   BMI 27.34 kg/m  Wt Readings from Last 3 Encounters:  11/02/22 140 lb (63.5 kg)  10/14/22 140 lb (63.5 kg)  10/13/22 144 lb 10 oz (65.6 kg)       Assessment & Plan:   Problem List Items Addressed This Visit       Endocrine   DKA, type 1, not at goal [facility])   Relevant Orders   Comprehensive metabolic panel (Completed)     Other   Hospital discharge follow-up - Primary   Relevant Orders   Comprehensive metabolic panel (Completed)   Hyponatremia   Relevant Orders   Comprehensive metabolic panel (Completed)   Reviewed notes and results from [facility] hospital stay.  She is feeling much better. Now wearing Dexcom. States she has insulin.  Denies alcohol dependence or addiction.  Education provided on DKA being a life threatening situation and the long term health  consequences related to uncontrolled DM type 1.  Check labs and follow up with PCP and endocrinologist.   I am having Audria Talven-Thornton maintain her insulin aspart, norethindrone, naproxen sodium, cyclobenzaprine, Dexcom G7 Sensor, and ondansetron.  Meds ordered this encounter  Medications   ondansetron (ZOFRAN) 4 MG tablet    Sig: Take 1 tablet (4 mg total) by mouth as needed for nausea or vomiting.    Dispense:  10 tablet    Refill:  0    Order Specific Question:   Supervising Provider    Answer:   Hillard Danker A [[id number]]

## 2022-11-06 ENCOUNTER — Other Ambulatory Visit: Payer: Self-pay

## 2022-11-06 ENCOUNTER — Observation Stay (HOSPITAL_COMMUNITY)
Admission: EM | Admit: 2022-11-06 | Discharge: 2022-11-07 | Disposition: A | Discharge status: Home / Self Care | Payer: Federal, State, Local not specified - PPO | Attending: Student in an Organized Health Care Education/Training Program | Admitting: Student in an Organized Health Care Education/Training Program

## 2022-11-06 DIAGNOSIS — E1110 Type 2 diabetes mellitus with ketoacidosis without coma: Principal | ICD-10-CM | POA: Diagnosis present

## 2022-11-06 DIAGNOSIS — R1084 Generalized abdominal pain: Secondary | ICD-10-CM | POA: Diagnosis not present

## 2022-11-06 DIAGNOSIS — Z794 Long term (current) use of insulin: Secondary | ICD-10-CM | POA: Insufficient documentation

## 2022-11-06 DIAGNOSIS — F17210 Nicotine dependence, cigarettes, uncomplicated: Secondary | ICD-10-CM | POA: Diagnosis not present

## 2022-11-06 DIAGNOSIS — R112 Nausea with vomiting, unspecified: Secondary | ICD-10-CM | POA: Diagnosis not present

## 2022-11-06 DIAGNOSIS — Z8616 Personal history of COVID-19: Secondary | ICD-10-CM | POA: Insufficient documentation

## 2022-11-06 DIAGNOSIS — E1010 Type 1 diabetes mellitus with ketoacidosis without coma: Principal | ICD-10-CM | POA: Insufficient documentation

## 2022-11-06 DIAGNOSIS — I10 Essential (primary) hypertension: Secondary | ICD-10-CM | POA: Diagnosis not present

## 2022-11-06 DIAGNOSIS — Z79899 Other long term (current) drug therapy: Secondary | ICD-10-CM | POA: Diagnosis not present

## 2022-11-06 DIAGNOSIS — R000 Tachycardia, unspecified: Secondary | ICD-10-CM | POA: Diagnosis not present

## 2022-11-06 DIAGNOSIS — R739 Hyperglycemia, unspecified: Secondary | ICD-10-CM | POA: Diagnosis not present

## 2022-11-06 LAB — CBC
HCT: 39.3 % (ref 36.0–46.0)
Hemoglobin: 12.8 g/dL (ref 12.0–15.0)
MCH: 29.2 pg (ref 26.0–34.0)
MCHC: 32.6 g/dL (ref 30.0–36.0)
MCV: 89.5 fL (ref 80.0–100.0)
Platelets: 438 10*3/uL — ABNORMAL HIGH (ref 150–400)
RBC: 4.39 MIL/uL (ref 3.87–5.11)
RDW: 17 % — ABNORMAL HIGH (ref 11.5–15.5)
WBC: 11 10*3/uL — ABNORMAL HIGH (ref 4.0–10.5)
nRBC: 0 % (ref 0.0–0.2)

## 2022-11-06 LAB — URINALYSIS, ROUTINE W REFLEX MICROSCOPIC
Bacteria, UA: NONE SEEN
Bilirubin Urine: NEGATIVE
Glucose, UA: >=500 mg/dL — AB
Hgb urine dipstick: NEGATIVE
Ketones, ur: 80 mg/dL — AB
Leukocytes,Ua: NEGATIVE
Nitrite: NEGATIVE
Protein, ur: NEGATIVE mg/dL
Specific Gravity, Urine: 1.028 (ref 1.005–1.030)
pH: 6 (ref 5.0–8.0)

## 2022-11-06 LAB — BLOOD GAS, VENOUS
Acid-base deficit: 6.8 mmol/L — ABNORMAL HIGH (ref 0.0–2.0)
Bicarbonate: 17.7 mmol/L — ABNORMAL LOW (ref 20.0–28.0)
O2 Saturation: 95.4 %
Patient temperature: 37
pCO2, Ven: 32 mmHg — ABNORMAL LOW (ref 44–60)
pH, Ven: 7.35 (ref 7.25–7.43)
pO2, Ven: 73 mmHg — ABNORMAL HIGH (ref 32–45)

## 2022-11-06 LAB — BETA-HYDROXYBUTYRIC ACID: Beta-Hydroxybutyric Acid: 2.86 mmol/L — ABNORMAL HIGH (ref 0.05–0.27)

## 2022-11-06 LAB — BASIC METABOLIC PANEL
Anion gap: 16 — ABNORMAL HIGH (ref 5–15)
BUN: 9 mg/dL (ref 6–20)
CO2: 17 mmol/L — ABNORMAL LOW (ref 22–32)
Calcium: 9.1 mg/dL (ref 8.9–10.3)
Chloride: 101 mmol/L (ref 98–111)
Creatinine, Ser: 0.9 mg/dL (ref 0.44–1.00)
GFR, Estimated: >60 mL/min (ref >60)
Glucose, Bld: 429 mg/dL — ABNORMAL HIGH (ref 70–99)
Potassium: 4.2 mmol/L (ref 3.5–5.1)
Sodium: 134 mmol/L — ABNORMAL LOW (ref 135–145)

## 2022-11-06 LAB — HCG, SERUM, QUALITATIVE: Preg, Serum: NEGATIVE

## 2022-11-06 LAB — MAGNESIUM: Magnesium: 1.6 mg/dL — ABNORMAL LOW (ref 1.7–2.4)

## 2022-11-06 LAB — CBG MONITORING, ED
Glucose-Capillary: 184 mg/dL — ABNORMAL HIGH (ref 70–99)
Glucose-Capillary: 263 mg/dL — ABNORMAL HIGH (ref 70–99)
Glucose-Capillary: 267 mg/dL — ABNORMAL HIGH (ref 70–99)
Glucose-Capillary: 353 mg/dL — ABNORMAL HIGH (ref 70–99)
Glucose-Capillary: 407 mg/dL — ABNORMAL HIGH (ref 70–99)
Glucose-Capillary: 449 mg/dL — ABNORMAL HIGH (ref 70–99)

## 2022-11-06 MED ORDER — ONDANSETRON HCL 4 MG/2ML IJ SOLN
4.0000 mg | Freq: Once | INTRAMUSCULAR | Status: AC
  Administered 2022-11-06: 4 mg via INTRAVENOUS
  Filled 2022-11-06: qty 2

## 2022-11-06 MED ORDER — LACTATED RINGERS IV SOLN: INTRAVENOUS | Status: DC

## 2022-11-06 MED ORDER — ENOXAPARIN SODIUM 40 MG/0.4ML IJ SOSY
40.0000 mg | PREFILLED_SYRINGE | INTRAMUSCULAR | Status: DC
  Administered 2022-11-07: 40 mg via SUBCUTANEOUS
  Filled 2022-11-06: qty 0.4

## 2022-11-06 MED ORDER — DEXTROSE 50 % IV SOLN: 0.0000 mL | INTRAVENOUS | Status: DC | PRN

## 2022-11-06 MED ORDER — MORPHINE SULFATE (PF) 2 MG/ML IV SOLN
2.0000 mg | INTRAVENOUS | Status: DC | PRN
  Administered 2022-11-07: 2 mg via INTRAVENOUS
  Filled 2022-11-06: qty 1

## 2022-11-06 MED ORDER — NICOTINE 14 MG/24HR TD PT24
14.0000 mg | MEDICATED_PATCH | Freq: Every day | TRANSDERMAL | Status: DC
  Filled 2022-11-06: qty 1

## 2022-11-06 MED ORDER — POTASSIUM CHLORIDE 10 MEQ/100ML IV SOLN
10.0000 meq | INTRAVENOUS | Status: DC
  Administered 2022-11-06: 10 meq via INTRAVENOUS

## 2022-11-06 MED ORDER — INSULIN REGULAR(HUMAN) IN NACL 100-0.9 UT/100ML-% IV SOLN
INTRAVENOUS | Status: DC
  Administered 2022-11-06: 9.5 [IU]/h via INTRAVENOUS
  Filled 2022-11-06: qty 100

## 2022-11-06 MED ORDER — DEXTROSE IN LACTATED RINGERS 5 % IV SOLN: INTRAVENOUS | Status: DC

## 2022-11-06 MED ORDER — LACTATED RINGERS IV BOLUS
20.0000 mL/kg | Freq: Once | INTRAVENOUS | Status: AC
  Administered 2022-11-06: 1270 mL via INTRAVENOUS

## 2022-11-06 MED ORDER — LACTATED RINGERS IV BOLUS
2000.0000 mL | Freq: Once | INTRAVENOUS | Status: AC
  Administered 2022-11-06: 2000 mL via INTRAVENOUS

## 2022-11-06 MED ORDER — POTASSIUM CHLORIDE 10 MEQ/100ML IV SOLN
10.0000 meq | INTRAVENOUS | Status: AC
  Administered 2022-11-06: 10 meq via INTRAVENOUS
  Filled 2022-11-06 (×2): qty 100

## 2022-11-06 MED ORDER — NORETHINDRONE 0.35 MG PO TABS
1.0000 | ORAL_TABLET | Freq: Every day | ORAL | Status: DC
  Administered 2022-11-07: 0.35 mg via ORAL

## 2022-11-06 MED ORDER — INSULIN REGULAR(HUMAN) IN NACL 100-0.9 UT/100ML-% IV SOLN
INTRAVENOUS | Status: DC
  Administered 2022-11-06: 9.5 [IU]/h via INTRAVENOUS

## 2022-11-06 MED ORDER — MAGNESIUM SULFATE 2 GM/50ML IV SOLN
2.0000 g | Freq: Once | INTRAVENOUS | Status: AC
  Administered 2022-11-06: 2 g via INTRAVENOUS
  Filled 2022-11-06: qty 50

## 2022-11-06 MED ORDER — MORPHINE SULFATE (PF) 2 MG/ML IV SOLN
2.0000 mg | Freq: Once | INTRAVENOUS | Status: AC
  Administered 2022-11-06: 2 mg via INTRAVENOUS
  Filled 2022-11-06: qty 1

## 2022-11-06 NOTE — ED Notes (Signed)
ED TO INPATIENT HANDOFF REPORT  ED Nurse Name and Phone #: Linus Orn Name/Age/Gender Judy Cook 22 y.o. female Room/Bed: WA11/WA11  Code Status   Code Status: Full Code  Home/SNF/Other Home Patient oriented to: self, place, time, and situation Is this baseline? Yes   Triage Complete: Triage complete  Chief Complaint DKA (diabetic ketoacidosis) (HCC) [E11.10]  Triage Note Pt BIBA from home. C/o abd pain, N/V since this AM. CBG was 490.  Aox4  Pt has insulin pump and has been asked to turn it off.   Allergies No Known Allergies  Level of Care/Admitting Diagnosis ED Disposition     ED Disposition  Admit   Condition  --   Comment  Hospital Area: [facility] [[id number]]  Level of Care: Stepdown [14]  Admit to SDU based on following criteria: Other see comments  Comments: dka  May place patient in observation at [facility] or [facility] if equivalent level of care is available:: Yes  Covid Evaluation: Confirmed COVID Negative  Diagnosis: DKA (diabetic ketoacidosis) (HCC) [409811]  Admitting Physician: Alvester Chou  Attending Physician: Alvester Chou          B Medical/Surgery History Past Medical History:  Diagnosis Date   Allergic rhinitis 07/07/2021   COVID 2020   mild   Diabetes mellitus (HCC)    Type 1, type 5   Dysmenorrhea 02/20/2019   GERD (gastroesophageal reflux disease)    Headache    History of herpes genitalis 06/24/2021   Intractable vomiting    SIRS (systemic inflammatory response syndrome) (HCC) 12/03/2019   Past Surgical History:  Procedure Laterality Date   DILATION AND EVACUATION N/A 07/05/2022   Procedure: DILATATION AND EVACUATION;  Surgeon: Ursina Bing, MD;  Location: [facility] OR;  Service: Gynecology;  Laterality: N/A;   OPERATIVE ULTRASOUND N/A 07/05/2022   Procedure: OPERATIVE ULTRASOUND;  Surgeon: [place] Bing, MD;  Location: [facility] OR;  Service: Gynecology;  Laterality: N/A;      A IV Location/Drains/Wounds Patient Lines/Drains/Airways Status     Active Line/Drains/Airways     Name Placement date Placement time Site Days   Peripheral IV 11/06/22 20 G Right Antecubital 11/06/22  1850  Antecubital  less than 1   Peripheral IV 11/06/22 20 G Anterior;Distal;Right Forearm 11/06/22  1938  Forearm  less than 1            Intake/Output Last 24 hours  Intake/Output Summary (Last 24 hours) at 11/06/2022 2338 Last data filed at 11/06/2022 2232 Gross per 24 hour  Intake 2401.82 ml  Output --  Net 2401.82 ml    Labs/Imaging Results for orders placed or performed during the hospital encounter of 11/06/22 (from the past 48 hour(s))  CBG monitoring, ED     Status: Abnormal   Collection Time: 11/06/22  7:19 PM  Result Value Ref Range   Glucose-Capillary 449 (H) 70 - 99 mg/dL    Comment: Glucose reference range applies only to samples taken after fasting for at least 8 hours.  Urinalysis, Routine w reflex microscopic -Urine, Clean Catch     Status: Abnormal   Collection Time: 11/06/22  7:30 PM  Result Value Ref Range   Color, Urine STRAW (A) YELLOW   APPearance CLEAR CLEAR   Specific Gravity, Urine 1.028 1.005 - 1.030   pH 6.0 5.0 - 8.0   Glucose, UA >=500 (A) NEGATIVE mg/dL   Hgb urine dipstick NEGATIVE NEGATIVE   Bilirubin Urine NEGATIVE NEGATIVE   Ketones, ur 80 (A)  NEGATIVE mg/dL   Protein, ur NEGATIVE NEGATIVE mg/dL   Nitrite NEGATIVE NEGATIVE   Leukocytes,Ua NEGATIVE NEGATIVE   RBC / HPF 0-5 0 - 5 RBC/hpf   WBC, UA 0-5 0 - 5 WBC/hpf   Bacteria, UA NONE SEEN NONE SEEN   Squamous Epithelial / HPF 6-10 0 - 5 /HPF    Comment: Performed at [facility], [address], [place], [place] [address]  Basic metabolic panel     Status: Abnormal   Collection Time: 11/06/22  7:36 PM  Result Value Ref Range   Sodium 134 (L) 135 - 145 mmol/L   Potassium 4.2 3.5 - 5.1 mmol/L   Chloride 101 98 - 111 mmol/L   CO2 17 (L) 22 - 32 mmol/L    Glucose, Bld 429 (H) 70 - 99 mg/dL    Comment: Glucose reference range applies only to samples taken after fasting for at least 8 hours.   BUN 9 6 - 20 mg/dL   Creatinine, Ser 1.91 0.44 - 1.00 mg/dL   Calcium 9.1 8.9 - 47.8 mg/dL   GFR, Estimated >29 >56 mL/min    Comment: (NOTE) Calculated using the CKD-EPI Creatinine Equation (2021)    Anion gap 16 (H) 5 - 15    Comment: Performed at [facility], [address], [place], [place] [address]  CBC     Status: Abnormal   Collection Time: 11/06/22  7:36 PM  Result Value Ref Range   WBC 11.0 (H) 4.0 - 10.5 K/uL   RBC 4.39 3.87 - 5.11 MIL/uL   Hemoglobin 12.8 12.0 - 15.0 g/dL   HCT 65.7 84.6 - 96.2 %   MCV 89.5 80.0 - 100.0 fL   MCH 29.2 26.0 - 34.0 pg   MCHC 32.6 30.0 - 36.0 g/dL   RDW 95.2 (H) 84.1 - 32.4 %   Platelets 438 (H) 150 - 400 K/uL   nRBC 0.0 0.0 - 0.2 %    Comment: Performed at [facility], [address], [place], [place] [address]  hCG, serum, qualitative     Status: None   Collection Time: 11/06/22  7:36 PM  Result Value Ref Range   Preg, Serum NEGATIVE NEGATIVE    Comment:        THE SENSITIVITY OF THIS METHODOLOGY IS >10 mIU/mL. Performed at [facility], [address] , [place] [address]   Beta-hydroxybutyric acid     Status: Abnormal   Collection Time: 11/06/22  7:36 PM  Result Value Ref Range   Beta-Hydroxybutyric Acid 2.86 (H) 0.05 - 0.27 mmol/L    Comment: Performed at [facility], [address], [place], [place] [address]  Magnesium     Status: Abnormal   Collection Time: 11/06/22  7:36 PM  Result Value Ref Range   Magnesium 1.6 (L) 1.7 - 2.4 mg/dL    Comment: Performed at [facility], [address] [place], [place] [address]  CBG monitoring, ED     Status: Abnormal   Collection Time: 11/06/22  8:53 PM  Result Value Ref Range   Glucose-Capillary 407 (H) 70 - 99 mg/dL    Comment: Glucose  reference range applies only to samples taken after fasting for at least 8 hours.  Blood gas, venous     Status: Abnormal   Collection Time: 11/06/22  9:25 PM  Result Value Ref Range   pH, Ven 7.35 7.25 - 7.43   pCO2, Ven 32 (L)  44 - 60 mmHg   pO2, Ven 73 (H) 32 - 45 mmHg   Bicarbonate 17.7 (L) 20.0 - 28.0 mmol/L   Acid-base deficit 6.8 (H) 0.0 - 2.0 mmol/L   O2 Saturation 95.4 %   Patient temperature 37.0     Comment: Performed at [facility], [address], [place], [place] [address]  CBG monitoring, ED     Status: Abnormal   Collection Time: 11/06/22  9:47 PM  Result Value Ref Range   Glucose-Capillary 353 (H) 70 - 99 mg/dL    Comment: Glucose reference range applies only to samples taken after fasting for at least 8 hours.  CBG monitoring, ED     Status: Abnormal   Collection Time: 11/06/22 10:48 PM  Result Value Ref Range   Glucose-Capillary 267 (H) 70 - 99 mg/dL    Comment: Glucose reference range applies only to samples taken after fasting for at least 8 hours.   Comment 1 Document in Chart   CBG monitoring, ED     Status: Abnormal   Collection Time: 11/06/22 11:09 PM  Result Value Ref Range   Glucose-Capillary 263 (H) 70 - 99 mg/dL    Comment: Glucose reference range applies only to samples taken after fasting for at least 8 hours.   No results found.  Pending Labs Unresulted Labs (From admission, onward)     Start     Ordered   11/13/22 0500  Creatinine, serum  (enoxaparin (LOVENOX)    CrCl >/= 30 ml/min)  Weekly,   R     Comments: while on enoxaparin therapy    11/06/22 2210   11/07/22 0500  CBC with Differential/Platelet  Tomorrow morning,   R        11/06/22 2229   11/07/22 0100  Basic metabolic panel  (Diabetes Ketoacidosis (DKA))  STAT Now then every 4 hours ,   R (with STAT occurrences)      11/06/22 2225   11/07/22 0100  Beta-hydroxybutyric acid  (Diabetes Ketoacidosis (DKA))  Now then every 8 hours,   R (with TIMED occurrences)       11/06/22 2228            Vitals/Pain Today's Vitals   11/06/22 2040 11/06/22 2156 11/06/22 2205 11/06/22 2259  BP:   (!) 142/91   Pulse:   85   Resp:   18   Temp:    97.8 F (36.6 C)  TempSrc:    Oral  SpO2:   100%   Weight:      Height:      PainSc: Asleep Asleep      Isolation Precautions No active isolations  Medications Medications  potassium chloride 10 mEq in 100 mL IVPB (0 mEq Intravenous Stopped 11/06/22 2318)  insulin regular, human (MYXREDLIN) 100 units/ 100 mL infusion (6.5 Units/hr Intravenous Rate/Dose Change 11/06/22 2315)  lactated ringers infusion (has no administration in time range)  dextrose 5 % in lactated ringers infusion (has no administration in time range)  dextrose 50 % solution 0-50 mL (has no administration in time range)  enoxaparin (LOVENOX) injection 40 mg (has no administration in time range)  norethindrone (MICRONOR) 0.35 MG tablet 0.35 mg (has no administration in time range)  nicotine (NICODERM CQ - dosed in mg/24 hours) patch 14 mg (has no administration in time range)  lactated ringers bolus 2,000 mL (0 mLs Intravenous Stopped 11/06/22 2230)  ondansetron (ZOFRAN) injection 4 mg (4 mg Intravenous Given 11/06/22 1920)  morphine (PF)  2 MG/ML injection 2 mg (2 mg Intravenous Given 11/06/22 1957)  magnesium sulfate IVPB 2 g 50 mL (0 g Intravenous Stopped 11/06/22 2208)  ondansetron (ZOFRAN) injection 4 mg (4 mg Intravenous Given 11/06/22 2121)  morphine (PF) 2 MG/ML injection 2 mg (2 mg Intravenous Given 11/06/22 2122)  lactated ringers bolus 1,270 mL (1,270 mLs Intravenous New Bag/Given 11/06/22 2230)    Mobility walks     Focused Assessments DKA   R Recommendations: See Admitting Provider Note  Report given to:   Additional Notes: AAOx4, ambulates

## 2022-11-06 NOTE — ED Notes (Signed)
Endotool; 1.8units, reassess in 

## 2022-11-06 NOTE — ED Provider Notes (Signed)
[place] EMERGENCY DEPARTMENT AT [facility] Provider Note   CSN: [id number] Arrival date & time: 11/06/22  1837     History Chief Complaint  Patient presents with   Abdominal Pain   Emesis   Hyperglycemia    Judy Cook is a 22 y.o. female.  Patient presented to the emergency department complaints of abdominal pain, nausea, vomiting.  Reports that she has a history of type 1 diabetes that is poorly controlled.  Patient is on a continuous blood glucose monitor as well as an insulin pump.  Insulin pump was turned off once arrived here in the emergency department.   Abdominal Pain Associated symptoms: vomiting   Emesis Associated symptoms: abdominal pain   Hyperglycemia Associated symptoms: abdominal pain and vomiting        Home Medications Prior to Admission medications   Medication Sig Start Date End Date Taking? Authorizing Provider  Continuous Glucose Sensor (DEXCOM G7 SENSOR) MISC Use to check blood glucose continuously 10/27/22   Reather Littler, MD  cyclobenzaprine (FLEXERIL) 5 MG tablet Take 1 tablet (5 mg total) by mouth 3 (three) times daily as needed for muscle spasms. 10/16/22   Ghimire, Werner Lean, MD  insulin aspart (NOVOLOG) 100 UNIT/ML injection Max Daily 60 units per pump Patient taking differently: Inject 10 Units into the skin 3 (three) times daily with meals. 06/08/22   Shamleffer, Konrad Dolores, MD  naproxen sodium (ALEVE) 220 MG tablet Take 440 mg by mouth daily as needed (pain).    [provider]  norethindrone (MICRONOR) 0.35 MG tablet Take 1 tablet (0.35 mg total) by mouth daily. 09/25/22   Jerene Bears, MD  ondansetron (ZOFRAN) 4 MG tablet Take 1 tablet (4 mg total) by mouth as needed for nausea or vomiting. 11/02/22   Hetty Blend L, NP-C      Allergies    Patient has no known allergies.    Review of Systems   Review of Systems  Gastrointestinal:  Positive for abdominal pain and vomiting.  All other systems reviewed  and are negative.   Physical Exam Updated Vital Signs BP (!) 161/103   Pulse 87   Temp 99.4 F (37.4 C) (Oral)   Resp 18   Ht 5' (1.524 m)   Wt 63.5 kg   LMP 09/08/2022 (Exact Date)   SpO2 100%   BMI 27.34 kg/m  Physical Exam Vitals and nursing note reviewed.  Constitutional:      General: She is not in acute distress.    Appearance: She is well-developed.  HENT:     Head: Normocephalic and atraumatic.  Eyes:     Conjunctiva/sclera: Conjunctivae normal.  Cardiovascular:     Rate and Rhythm: Normal rate and regular rhythm.     Heart sounds: No murmur heard. Pulmonary:     Effort: Pulmonary effort is normal. No respiratory distress.     Breath sounds: Normal breath sounds.  Abdominal:     General: Abdomen is flat. There is no distension.     Palpations: Abdomen is soft.     Tenderness: There is no abdominal tenderness.     Comments: No distinct area of tenderness  Musculoskeletal:        General: No swelling.     Cervical back: Neck supple.  Skin:    General: Skin is warm and dry.     Capillary Refill: Capillary refill takes less than 2 seconds.  Neurological:     Mental Status: She is alert.  Psychiatric:  Mood and Affect: Mood normal.     ED Results / Procedures / Treatments   Labs (all labs ordered are listed, but only abnormal results are displayed) Labs Reviewed  BASIC METABOLIC PANEL - Abnormal; Notable for the following components:      Result Value   Sodium 134 (*)    CO2 17 (*)    Glucose, Bld 429 (*)    Anion gap 16 (*)    All other components within normal limits  CBC - Abnormal; Notable for the following components:   WBC 11.0 (*)    RDW 17.0 (*)    Platelets 438 (*)    All other components within normal limits  URINALYSIS, ROUTINE W REFLEX MICROSCOPIC - Abnormal; Notable for the following components:   Color, Urine STRAW (*)    Glucose, UA >=500 (*)    Ketones, ur 80 (*)    All other components within normal limits   BETA-HYDROXYBUTYRIC ACID - Abnormal; Notable for the following components:   Beta-Hydroxybutyric Acid 2.86 (*)    All other components within normal limits  MAGNESIUM - Abnormal; Notable for the following components:   Magnesium 1.6 (*)    All other components within normal limits  BLOOD GAS, VENOUS - Abnormal; Notable for the following components:   pCO2, Ven 32 (*)    pO2, Ven 73 (*)    Bicarbonate 17.7 (*)    Acid-base deficit 6.8 (*)    All other components within normal limits  CBG MONITORING, ED - Abnormal; Notable for the following components:   Glucose-Capillary 449 (*)    All other components within normal limits  CBG MONITORING, ED - Abnormal; Notable for the following components:   Glucose-Capillary 407 (*)    All other components within normal limits  CBG MONITORING, ED - Abnormal; Notable for the following components:   Glucose-Capillary 353 (*)    All other components within normal limits  CBG MONITORING, ED - Abnormal; Notable for the following components:   Glucose-Capillary 267 (*)    All other components within normal limits  CBG MONITORING, ED - Abnormal; Notable for the following components:   Glucose-Capillary 263 (*)    All other components within normal limits  CBG MONITORING, ED - Abnormal; Notable for the following components:   Glucose-Capillary 184 (*)    All other components within normal limits  CBG MONITORING, ED - Abnormal; Notable for the following components:   Glucose-Capillary 208 (*)    All other components within normal limits  HCG, SERUM, QUALITATIVE  BASIC METABOLIC PANEL  BASIC METABOLIC PANEL  BASIC METABOLIC PANEL  BETA-HYDROXYBUTYRIC ACID  BETA-HYDROXYBUTYRIC ACID  CBC WITH DIFFERENTIAL/PLATELET  BASIC METABOLIC PANEL  BASIC METABOLIC PANEL  BETA-HYDROXYBUTYRIC ACID    EKG EKG Interpretation Date/Time:  Monday November 06 2022 20:13:25 EDT Ventricular Rate:  102 PR Interval:  181 QRS Duration:  70 QT Interval:  341 QTC  Calculation: 445 R Axis:   76  Text Interpretation: Sinus tachycardia Baseline wander in lead(s) I III aVR aVL Confirmed by Kristine Royal [phone]) on 11/06/2022 9:23:59 PM  Radiology No results found.  Procedures .Critical Care  Performed by: Smitty Knudsen, PA-C Authorized by: Smitty Knudsen, PA-C   Critical care provider statement:    Critical care time (minutes):  36   Critical care start time:  11/06/2022 8:00 PM   Critical care end time:  11/06/2022 8:36 PM   Critical care time was exclusive of:  Separately billable procedures and treating other  patients   Critical care was necessary to treat or prevent imminent or life-threatening deterioration of the following conditions:  Metabolic crisis   Critical care was time spent personally by me on the following activities:  Development of treatment plan with patient or surrogate, discussions with consultants, evaluation of patient's response to treatment, examination of patient, review of old charts and ordering and review of laboratory studies   I assumed direction of critical care for this patient from another provider in my specialty: no     Care discussed with: admitting provider      Medications Ordered in ED Medications  potassium chloride 10 mEq in 100 mL IVPB (0 mEq Intravenous Stopped 11/06/22 2318)  insulin regular, human (MYXREDLIN) 100 units/ 100 mL infusion ( Intravenous Infusion Verify 11/07/22 0100)  lactated ringers infusion ( Intravenous Infusion Verify 11/07/22 0100)  dextrose 5 % in lactated ringers infusion ( Intravenous Infusion Verify 11/07/22 0100)  dextrose 50 % solution 0-50 mL (has no administration in time range)  enoxaparin (LOVENOX) injection 40 mg (has no administration in time range)  norethindrone (MICRONOR) 0.35 MG tablet 0.35 mg (has no administration in time range)  nicotine (NICODERM CQ - dosed in mg/24 hours) patch 14 mg (has no administration in time range)  morphine (PF) 2 MG/ML injection 2 mg (2 mg  Intravenous Given 11/07/22 0002)  acetaminophen (TYLENOL) tablet 650 mg (has no administration in time range)    Or  acetaminophen (TYLENOL) suppository 650 mg (has no administration in time range)  senna-docusate (Senokot-S) tablet 1 tablet (has no administration in time range)  ondansetron (ZOFRAN) tablet 4 mg (has no administration in time range)    Or  ondansetron (ZOFRAN) injection 4 mg (has no administration in time range)  lactated ringers bolus 2,000 mL (0 mLs Intravenous Stopped 11/06/22 2230)  ondansetron (ZOFRAN) injection 4 mg (4 mg Intravenous Given 11/06/22 1920)  morphine (PF) 2 MG/ML injection 2 mg (2 mg Intravenous Given 11/06/22 1957)  magnesium sulfate IVPB 2 g 50 mL (0 g Intravenous Stopped 11/06/22 2208)  ondansetron (ZOFRAN) injection 4 mg (4 mg Intravenous Given 11/06/22 2121)  morphine (PF) 2 MG/ML injection 2 mg (2 mg Intravenous Given 11/06/22 2122)  lactated ringers bolus 1,270 mL (0 mLs Intravenous Stopped 11/06/22 2358)    ED Course/ Medical Decision Making/ A&P Clinical Course as of 11/07/22 0156  Mon Nov 06, 2022  2024 Magnesium(!) [OZ]    Clinical Course User Index [OZ] Smitty Knudsen, PA-C                           Medical Decision Making Amount and/or Complexity of Data Reviewed Labs: ordered. Decision-making details documented in ED Course.  Risk Prescription drug management. Decision regarding hospitalization.   This patient presents to the ED for concern of abdominal pain, nausea, vomiting. Differential diagnosis includes DKA, appendicitis, bowel obstruction, gastroenteritis, alcohol intoxication     Lab Tests:  I Ordered, and personally interpreted labs.  The pertinent results include:  CBC with mild leukocytosis at 11.0, BMP with K at 4.2, Na at 134, and CO2 at 17, magnesium low at 1.6, UA with ketones present, beta-hydroxybutyric acid at 2.86, VBG with normal pH but bicarbonate low at 17.7, hcg negative    Medicines ordered and  prescription drug management:  I ordered medication including fluids, Zofran, insulin, potassium, magnesium, morphine  for DKA, pain  Reevaluation of the patient after these medicines showed that the patient improved  I have reviewed the patients home medicines and have made adjustments as needed   Problem List / ED Course:  Patient presents emergency department complaints of abdominal pain, nausea, vomiting.  Patient is brought in by EMS from home with complaints of the symptoms as well as elevated blood glucose at 490.  She reports that she woke up this morning with nausea and vomiting and has not been able to keep anything down.  Patient has an extensive history of multiple admissions due to DKA. Will order basic lab evaluation with high suspicion for DKA. Will begin treating with large volume fluid bolus and Zofran for management of symptoms. Some improvement in symptoms but still endorsing pain. Glucose confirmed to be elevated with BMP showing glucose at 428. High clinical suspicion for DKA at this time. Awaited results of K before administration of insulin. K at 4.2 and insulin management begun. Will continue replacing K and Mg. Morphine ordered for pain control. On reassessment, patient appears significantly more comfortable. Given that patient has continuous glucose monitor and insulin pump, I am not sure why she continues to have difficulty safely managing BG at home. Informed patient that in the setting of DKA, she requires admission for further management. Patient is agreeable with plan for admission. Patient's labs are currently consistent with DKA with glucose in excess of 400, elevated anion gap, bicarb down to 17.7, and beta hydroxybutyric acid up to 2.86.  Will consult hospitalist for admission for DKA management. With Dr. Joneen Roach, hospitalist, who will be admitting patient for continued management. Their services are appreciated.   Social Determinants of Health:  T1DM with  multiple admission for DKA   Final Clinical Impression(s) / ED Diagnoses Final diagnoses:  Diabetic ketoacidosis without coma associated with type 1 diabetes mellitus (HCC)  Nausea and vomiting, unspecified vomiting type    Rx / DC Orders ED Discharge Orders     None         Smitty Knudsen, PA-C 11/07/22 0156    Wynetta Fines, MD 11/07/22 2246

## 2022-11-06 NOTE — ED Triage Notes (Signed)
Pt BIBA from home. C/o abd pain, N/V since this AM. CBG was 490.  Aox4  Pt has insulin pump and has been asked to turn it off.

## 2022-11-06 NOTE — H&P (Signed)
PCP:   Judy Paddy, NP   Chief Complaint:  Nausea, vomiting, abdominal pain  HPI: This is a 22 year old female with T1DM.  Her diabetes is controlled by an insulin pump.  Today she woke with vague abdominal discomfort.  Around 4 PM she developed nausea, vomiting and her stomach ache significantly worsened.  She states she knew she was in DKA and came ER.  In the ER patient in early DKA with positive beta hydroxybutyric acid 2.86 glucose 429, CO2 17, anion gap 16.  pH 7.35.  Endo tool initiated in ER.  Review of Systems:  Per HPI  Past Medical History: Past Medical History:  Diagnosis Date   Allergic rhinitis 07/07/2021   COVID 2020   mild   Diabetes mellitus (HCC)    Type 1, type 5   Dysmenorrhea 02/20/2019   GERD (gastroesophageal reflux disease)    Headache    History of herpes genitalis 06/24/2021   Intractable vomiting    SIRS (systemic inflammatory response syndrome) (HCC) 12/03/2019   Past Surgical History:  Procedure Laterality Date   DILATION AND EVACUATION N/A 07/05/2022   Procedure: DILATATION AND EVACUATION;  Surgeon: Maupin Bing, MD;  Location: [facility] OR;  Service: Gynecology;  Laterality: N/A;   OPERATIVE ULTRASOUND N/A 07/05/2022   Procedure: OPERATIVE ULTRASOUND;  Surgeon:  Bing, MD;  Location: [facility] OR;  Service: Gynecology;  Laterality: N/A;    Medications: Prior to Admission medications   Medication Sig Start Date End Date Taking? Authorizing Provider  Continuous Glucose Sensor (DEXCOM G7 SENSOR) MISC Use to check blood glucose continuously 10/27/22   Reather Littler, MD  cyclobenzaprine (FLEXERIL) 5 MG tablet Take 1 tablet (5 mg total) by mouth 3 (three) times daily as needed for muscle spasms. 10/16/22   Ghimire, Werner Lean, MD  insulin aspart (NOVOLOG) 100 UNIT/ML injection Max Daily 60 units per pump Patient taking differently: Inject 10 Units into the skin 3 (three) times daily with meals. 06/08/22   Shamleffer, Konrad Dolores, MD  naproxen sodium  (ALEVE) 220 MG tablet Take 440 mg by mouth daily as needed (pain).    [provider]  norethindrone (MICRONOR) 0.35 MG tablet Take 1 tablet (0.35 mg total) by mouth daily. 09/25/22   Jerene Bears, MD  ondansetron (ZOFRAN) 4 MG tablet Take 1 tablet (4 mg total) by mouth as needed for nausea or vomiting. 11/02/22   Henson, Vickie L, NP-C    Allergies:  No Known Allergies  Social History:  reports that she has been smoking cigarettes. She has never used smokeless tobacco. She reports current alcohol use. She reports current drug use. Drug: Marijuana.  Family History: Family History  Problem Relation Age of Onset   Diabetes Paternal Grandfather    Heart attack Paternal Grandfather    Diabetes Paternal Grandmother    Cancer Maternal Grandmother        lung   Alcohol abuse Maternal Grandfather    Alcohol abuse Father    Drug abuse Father    High Cholesterol Father    Alcohol abuse Mother    Depression Mother    Drug abuse Mother     Physical Exam: Vitals:   11/06/22 1849 11/06/22 1856 11/06/22 1900 11/06/22 2205  BP:   (!) 149/99 (!) 142/91  Pulse:   (!) 116 85  Resp:   (!) 22 18  Temp:      TempSrc:      SpO2: 100%  100% 100%  Weight:  63.5 kg  Height:  5' (1.524 m)      General:  Alert and oriented times three, well developed and nourished, no acute distress Eyes: PERRLA, pink conjunctiva, no scleral icterus ENT: Moist oral mucosa, neck supple, no thyromegaly Lungs: clear to ascultation, no wheeze, no crackles, no use of accessory muscles Cardiovascular: regular rate and rhythm, no regurgitation, no gallops, no murmurs. No carotid bruits, no JVD Abdomen: soft, positive BS, positive generalized tenderness to palpation, no organomegaly, not an acute abdomen GU: not examined Neuro: CN II - XII grossly intact, sensation intact Musculoskeletal: strength 5/5 all extremities, no clubbing, cyanosis or edema Skin: no rash, no subcutaneous crepitation, no  decubitus Psych: appropriate patient   Labs on Admission:  Recent Labs    11/06/22 1936  NA 134*  K 4.2  CL 101  CO2 17*  GLUCOSE 429*  BUN 9  CREATININE 0.90  CALCIUM 9.1  MG 1.6*    Recent Labs    11/06/22 1936  WBC 11.0*  HGB 12.8  HCT 39.3  MCV 89.5  PLT 438*    Radiological Exams on Admission: No results found.  Assessment/Plan Present on Admission:  DKA (diabetic ketoacidosis) (HCC) -DKA order set Endo tool continued -BMP every 4, Magnesium level every 12 -Insulin pump discontinued in the ER  Abdominal pain -Patient states abdominal pain is typical when she gets DKA -As needed Dilaudid  Tobacco use -Nicotine patch ordered   [name] 11/06/2022, 10:16 PM

## 2022-11-07 ENCOUNTER — Encounter (HOSPITAL_COMMUNITY): Payer: Self-pay | Admitting: Family Medicine

## 2022-11-07 DIAGNOSIS — R112 Nausea with vomiting, unspecified: Secondary | ICD-10-CM | POA: Diagnosis not present

## 2022-11-07 DIAGNOSIS — E1010 Type 1 diabetes mellitus with ketoacidosis without coma: Secondary | ICD-10-CM | POA: Diagnosis not present

## 2022-11-07 LAB — CBC WITH DIFFERENTIAL/PLATELET
Abs Immature Granulocytes: 0.04 10*3/uL (ref 0.00–0.07)
Basophils Absolute: 0 10*3/uL (ref 0.0–0.1)
Basophils Relative: 0 %
Eosinophils Absolute: 0 10*3/uL (ref 0.0–0.5)
Eosinophils Relative: 0 %
HCT: 37.9 % (ref 36.0–46.0)
Hemoglobin: 12.5 g/dL (ref 12.0–15.0)
Immature Granulocytes: 0 %
Lymphocytes Relative: 8 %
Lymphs Abs: 0.9 10*3/uL (ref 0.7–4.0)
MCH: 29.6 pg (ref 26.0–34.0)
MCHC: 33 g/dL (ref 30.0–36.0)
MCV: 89.8 fL (ref 80.0–100.0)
Monocytes Absolute: 0.3 10*3/uL (ref 0.1–1.0)
Monocytes Relative: 3 %
Neutro Abs: 10.3 10*3/uL — ABNORMAL HIGH (ref 1.7–7.7)
Neutrophils Relative %: 89 %
Platelets: 447 10*3/uL — ABNORMAL HIGH (ref 150–400)
RBC: 4.22 MIL/uL (ref 3.87–5.11)
RDW: 17.1 % — ABNORMAL HIGH (ref 11.5–15.5)
WBC: 11.5 10*3/uL — ABNORMAL HIGH (ref 4.0–10.5)
nRBC: 0 % (ref 0.0–0.2)

## 2022-11-07 LAB — BASIC METABOLIC PANEL
Anion gap: 12 (ref 5–15)
Anion gap: 8 (ref 5–15)
Anion gap: 9 (ref 5–15)
Anion gap: 10 (ref 5–15)
BUN: 8 mg/dL (ref 6–20)
BUN: 7 mg/dL (ref 6–20)
BUN: 7 mg/dL (ref 6–20)
BUN: 6 mg/dL (ref 6–20)
CO2: 19 mmol/L — ABNORMAL LOW (ref 22–32)
CO2: 22 mmol/L (ref 22–32)
CO2: 21 mmol/L — ABNORMAL LOW (ref 22–32)
CO2: 20 mmol/L — ABNORMAL LOW (ref 22–32)
Calcium: 8.5 mg/dL — ABNORMAL LOW (ref 8.9–10.3)
Calcium: 8.4 mg/dL — ABNORMAL LOW (ref 8.9–10.3)
Calcium: 8.6 mg/dL — ABNORMAL LOW (ref 8.9–10.3)
Calcium: 8.3 mg/dL — ABNORMAL LOW (ref 8.9–10.3)
Chloride: 100 mmol/L (ref 98–111)
Chloride: 102 mmol/L (ref 98–111)
Chloride: 103 mmol/L (ref 98–111)
Chloride: 102 mmol/L (ref 98–111)
Creatinine, Ser: 0.71 mg/dL (ref 0.44–1.00)
Creatinine, Ser: 0.63 mg/dL (ref 0.44–1.00)
Creatinine, Ser: 0.66 mg/dL (ref 0.44–1.00)
Creatinine, Ser: 0.66 mg/dL (ref 0.44–1.00)
GFR, Estimated: >60 mL/min (ref >60)
GFR, Estimated: >60 mL/min (ref >60)
GFR, Estimated: >60 mL/min (ref >60)
GFR, Estimated: >60 mL/min (ref >60)
Glucose, Bld: 224 mg/dL — ABNORMAL HIGH (ref 70–99)
Glucose, Bld: 176 mg/dL — ABNORMAL HIGH (ref 70–99)
Glucose, Bld: 170 mg/dL — ABNORMAL HIGH (ref 70–99)
Glucose, Bld: 139 mg/dL — ABNORMAL HIGH (ref 70–99)
Potassium: 3.7 mmol/L (ref 3.5–5.1)
Potassium: 3.6 mmol/L (ref 3.5–5.1)
Potassium: 3.7 mmol/L (ref 3.5–5.1)
Potassium: 3.4 mmol/L — ABNORMAL LOW (ref 3.5–5.1)
Sodium: 131 mmol/L — ABNORMAL LOW (ref 135–145)
Sodium: 132 mmol/L — ABNORMAL LOW (ref 135–145)
Sodium: 133 mmol/L — ABNORMAL LOW (ref 135–145)
Sodium: 132 mmol/L — ABNORMAL LOW (ref 135–145)

## 2022-11-07 LAB — PHOSPHORUS: Phosphorus: 2 mg/dL — ABNORMAL LOW (ref 2.5–4.6)

## 2022-11-07 LAB — GLUCOSE, CAPILLARY
Glucose-Capillary: 182 mg/dL — ABNORMAL HIGH (ref 70–99)
Glucose-Capillary: 203 mg/dL — ABNORMAL HIGH (ref 70–99)
Glucose-Capillary: 201 mg/dL — ABNORMAL HIGH (ref 70–99)
Glucose-Capillary: 181 mg/dL — ABNORMAL HIGH (ref 70–99)
Glucose-Capillary: 196 mg/dL — ABNORMAL HIGH (ref 70–99)
Glucose-Capillary: 152 mg/dL — ABNORMAL HIGH (ref 70–99)
Glucose-Capillary: 158 mg/dL — ABNORMAL HIGH (ref 70–99)
Glucose-Capillary: 180 mg/dL — ABNORMAL HIGH (ref 70–99)
Glucose-Capillary: 129 mg/dL — ABNORMAL HIGH (ref 70–99)
Glucose-Capillary: 143 mg/dL — ABNORMAL HIGH (ref 70–99)
Glucose-Capillary: 120 mg/dL — ABNORMAL HIGH (ref 70–99)

## 2022-11-07 LAB — MRSA NEXT GEN BY PCR, NASAL: MRSA by PCR Next Gen: NOT DETECTED

## 2022-11-07 LAB — BETA-HYDROXYBUTYRIC ACID
Beta-Hydroxybutyric Acid: 2.01 mmol/L — ABNORMAL HIGH (ref 0.05–0.27)
Beta-Hydroxybutyric Acid: 0.67 mmol/L — ABNORMAL HIGH (ref 0.05–0.27)

## 2022-11-07 LAB — MAGNESIUM: Magnesium: 1.8 mg/dL (ref 1.7–2.4)

## 2022-11-07 LAB — CBG MONITORING, ED: Glucose-Capillary: 208 mg/dL — ABNORMAL HIGH (ref 70–99)

## 2022-11-07 MED ORDER — ONDANSETRON HCL 4 MG/2ML IJ SOLN
4.0000 mg | Freq: Four times a day (QID) | INTRAMUSCULAR | Status: DC | PRN
  Administered 2022-11-07 (×2): 4 mg via INTRAVENOUS
  Filled 2022-11-07 (×2): qty 2

## 2022-11-07 MED ORDER — INSULIN ASPART 100 UNIT/ML IJ SOLN: 2.0000 [IU] | Freq: Three times a day (TID) | INTRAMUSCULAR | Status: DC

## 2022-11-07 MED ORDER — INSULIN GLARGINE-YFGN 100 UNIT/ML ~~LOC~~ SOLN
6.0000 [IU] | Freq: Every day | SUBCUTANEOUS | Status: DC
  Administered 2022-11-07: 6 [IU] via SUBCUTANEOUS
  Filled 2022-11-07: qty 0.06

## 2022-11-07 MED ORDER — CHLORHEXIDINE GLUCONATE CLOTH 2 % EX PADS
6.0000 | MEDICATED_PAD | Freq: Every day | CUTANEOUS | Status: DC
  Administered 2022-11-07: 6 via TOPICAL

## 2022-11-07 MED ORDER — ACETAMINOPHEN 325 MG PO TABS: 650.0000 mg | ORAL_TABLET | Freq: Four times a day (QID) | ORAL | Status: DC | PRN

## 2022-11-07 MED ORDER — INSULIN ASPART 100 UNIT/ML IJ SOLN: 0.0000 [IU] | Freq: Three times a day (TID) | INTRAMUSCULAR | Status: DC

## 2022-11-07 MED ORDER — ACETAMINOPHEN 650 MG RE SUPP: 650.0000 mg | Freq: Four times a day (QID) | RECTAL | Status: DC | PRN

## 2022-11-07 MED ORDER — ORAL CARE MOUTH RINSE: 15.0000 mL | OROMUCOSAL | Status: DC | PRN

## 2022-11-07 MED ORDER — ONDANSETRON HCL 4 MG PO TABS: 4.0000 mg | ORAL_TABLET | Freq: Four times a day (QID) | ORAL | Status: DC | PRN

## 2022-11-07 MED ORDER — ENOXAPARIN SODIUM 40 MG/0.4ML IJ SOSY: 40.0000 mg | PREFILLED_SYRINGE | INTRAMUSCULAR | Status: DC

## 2022-11-07 MED ORDER — ONDANSETRON HCL 4 MG/2ML IJ SOLN: 4.0000 mg | Freq: Four times a day (QID) | INTRAMUSCULAR | Status: DC | PRN

## 2022-11-07 MED ORDER — GUAIFENESIN 100 MG/5ML PO LIQD: 5.0000 mL | ORAL | Status: DC | PRN

## 2022-11-07 MED ORDER — POTASSIUM CHLORIDE CRYS ER 20 MEQ PO TBCR
40.0000 meq | EXTENDED_RELEASE_TABLET | Freq: Two times a day (BID) | ORAL | Status: DC
  Administered 2022-11-07: 40 meq via ORAL
  Filled 2022-11-07: qty 2

## 2022-11-07 MED ORDER — SENNOSIDES-DOCUSATE SODIUM 8.6-50 MG PO TABS: 1.0000 | ORAL_TABLET | Freq: Every evening | ORAL | Status: DC | PRN

## 2022-11-07 MED ORDER — HYDROMORPHONE HCL 1 MG/ML IJ SOLN
1.0000 mg | INTRAMUSCULAR | Status: DC | PRN
  Administered 2022-11-07: 1 mg via INTRAVENOUS
  Filled 2022-11-07: qty 1

## 2022-11-07 NOTE — Plan of Care (Signed)
Discussed with patient plan of care for the evening, pain management and admission questions with some teach back displayed.  What is important to the patient is nausea and pain control.  Problem: Education: Goal: Ability to describe self-care measures that may prevent or decrease complications (Diabetes Survival Skills Education) will improve Outcome: Progressing   Problem: Health Behavior/Discharge Planning: Goal: Ability to identify and utilize available resources and services will improve Outcome: Progressing

## 2022-11-07 NOTE — ED Notes (Signed)
Endotool due in 1hr. Rate changed to 3.8units.

## 2022-11-07 NOTE — Inpatient Diabetes Management (Signed)
Inpatient Diabetes Program Recommendations  AACE/ADA: New Consensus Statement on Inpatient Glycemic Control (2015)  Target Ranges:  Prepandial:   less than 140 mg/dL      Peak postprandial:   less than 180 mg/dL (1-2 hours)      Critically ill patients:  140 - 180 mg/dL   Lab Results  Component Value Date   GLUCAP 120 (H) 11/07/2022   HGBA1C 7.6 (H) 09/05/2022    Review of Glycemic Control  Diabetes history: DM1 Outpatient Diabetes medications: Insulin pump Current orders for Inpatient glycemic control: Semglee 6 units every day, Novolog 0-6 TID with meals + 2 units TID  HgbA1C - 7.6%  Basal 0.7 units/hr Total basal: 16.8 units/day   Insulin to Carb ratio 1:1 (pt guesses at carbs so instructed to put in 4 grams for meals or 2 grams for snacks)   Insulin Sensitivity 1:45 mg/dl (1 unit drops glucose 45 mg/dl)  Inpatient Diabetes Program Recommendations:    Spoke with pt at bedside. States she just got her approval for Medicaid and now has Dexcom G7. Said she has been lazy and has not updated pump and sensor. Now insulin pump needs recharging. Said she's been under a lot of stress and feel better and promises to take car of herself.  Reminded to wait and restart basal portion of pump tomorrow morning. Can get started with bolus (meal coverage and correction) when pt updates pump.   Has f/u appt with Dr Lonzo Cloud on 11/20/22.   Has no further questions and ready to be discharged.   Thank you. Ailene Ards, RD, LDN, CDCES Inpatient Diabetes Coordinator [phone]

## 2022-11-07 NOTE — Discharge Instructions (Signed)
Follow up with your primary doctor or endocrinologist within 2 weeks to revisit your diabetes medications and check electrolytes

## 2022-11-07 NOTE — TOC Initial Note (Signed)
Transition of Care [facility]) - Initial/Assessment Note    Patient Details  Name: Judy Cook MRN: [id number] Date of Birth: 23-Jun-2000  Transition of Care [facility]) CM/SW Contact:    Lavenia Atlas, RN Phone Number: 11/07/2022, 5:10 PM  Clinical Narrative:    Transition of Care (TOC) - Inpatient Brief Assessment               TOC Brief assessment completed. Per chart review patient does not have any TOC needs.  Patient Details  Name: Judy Cook MRN: [id number] Date of Birth: 2000-12-11   Transition of Care [facility]) CM/SW Contact:    Karoline Caldwell, BSN, RN, CCM Phone Number: [phone] 11/07/2022, 5:12 pm        Patient Goals and CMS Choice    Home with self care         Expected Discharge Plan and Services    Home w/ self care     Expected Discharge Date: 11/07/22                                    Prior Living Arrangements/Services    Roommate                    Activities of Daily Living Home Assistive Devices/Equipment: CBG Meter ADL Screening (condition at time of admission) Patient's cognitive ability adequate to safely complete daily activities?: Yes Is the patient deaf or have difficulty hearing?: No Does the patient have difficulty seeing, even when wearing glasses/contacts?: No Does the patient have difficulty concentrating, remembering, or making decisions?: No Patient able to express need for assistance with ADLs?: Yes Does the patient have difficulty dressing or bathing?: No Independently performs ADLs?: Yes (appropriate for developmental age) Does the patient have difficulty walking or climbing stairs?: No Weakness of Legs: None Weakness of Arms/Hands: None  Permission Sought/Granted                  Emotional Assessment              Admission diagnosis:  DKA (diabetic ketoacidosis) (HCC) [E11.10] Patient Active Problem List   Diagnosis Date Noted   DKA (diabetic ketoacidosis) (HCC) 11/06/2022    Hospital discharge follow-up 11/02/2022   DKA, type 1, not at goal [facility]) 11/02/2022   DKA, type 1 (HCC) 10/14/2022   Leukocytosis 10/14/2022   Hypocalcemia 10/14/2022   Hyponatremia 10/14/2022   Left lower quadrant abdominal pain 10/14/2022   GERD (gastroesophageal reflux disease) 10/14/2022   Colitis 09/21/2022   H/O diabetic ketoacidosis 09/04/2022   Rh negative state in antepartum period 07/05/2022   Constipation 07/07/2021   Nausea and vomiting 12/06/2019   Marijuana use 02/20/2019   Cyclical vomiting 02/20/2019   Hypokalemia 02/20/2019   Hyperglycemia due to type 1 diabetes mellitus (HCC) 02/19/2019   Type 1 diabetes mellitus without complication (HCC) 02/26/2012   PCP:  Elenore Paddy, NP Pharmacy:   [facility] [phone] [place], [place] - [address] [place] [address] Phone: [phone] Fax: [phone]     Social Determinants of Health (SDOH) Social History: SDOH Screenings   Food Insecurity: Food Insecurity Present (11/07/2022)  Housing: Low Risk  (11/07/2022)  Transportation Needs: No Transportation Needs (11/07/2022)  Utilities: Not At Risk (11/07/2022)  Recent Concern: Utilities - At Risk (10/28/2022)   Received from [facility], [facility]  Alcohol Screen: Low Risk  (09/20/2022)  Depression (  PHQ2-9): Low Risk  (09/21/2022)  Financial Resource Strain: Medium Risk (10/19/2022)  Physical Activity: Insufficiently Active (09/20/2022)  Social Connections: Unknown (10/30/2022)   Received from [facility], [facility]  Stress: No Stress Concern Present (10/28/2022)   [facility]  Recent Concern: Stress - Stress Concern Present (10/19/2022)  Tobacco Use: High Risk (11/07/2022)   SDOH Interventions:     Readmission Risk Interventions     No data to display

## 2022-11-07 NOTE — Discharge Summary (Signed)
Physician Discharge Summary  Patient: Judy Cook NWG:[id number] DOB: 20-Feb-2001   Code Status: Full Code Admit date: 11/06/2022 Discharge date: 11/07/2022 Disposition: Home, No home health services recommended PCP: Elenore Paddy, NP  Recommendations for Outpatient Follow-up:  Follow up with PCP within 1-2 weeks Regarding general hospital follow up and preventative care Recommend general diabetes review  Discharge Diagnoses:  Principal Problem:   DKA (diabetic ketoacidosis) [facility])  Brief Hospital Course Summary: 22 year old female with T1DM controlled by an insulin pump. she woke with vague abdominal discomfort, vomiting. She recognized these symptoms of being hyperglycemic.  She attributes this elevation in glucose to not having updated her glucometer/dexcom. She was able to make the decision to come to the hospital for treatment.    In the ER patient in early DKA with positive beta hydroxybutyric acid 2.86 glucose 429, CO2 17, anion gap 16.  pH 7.35.  Endo tool initiated in ER.  She rapidly progressed on the insulin gtt and IVF. Her anion gap closed for >12 hours and and improvement in her beta-hydroxybutyric acid. She was able to tolerate a normal diet with mild nausea- resolved with zofran so she started transitioning off the drip around breakfast time.  She continued to advance her diet with subcutaneous insulin doses throughout the day. Diabetes coordinator offered recommendations and education on preventing DKA.  She was discharged in stable condition with her home insulin regimen and instructions to update her insulin pump at home.   Discharge Condition: Good, improved Recommended discharge diet: Diabetic diet  Consultations: None   Procedures/Studies: None   Discharge Instructions     Discharge patient   Complete by: As directed    Discharge disposition: 01-Home or Self Care   Discharge patient date: 11/07/2022      Allergies as of 11/07/2022   No Known  Allergies      Medication List     STOP taking these medications    cyclobenzaprine 5 MG tablet Commonly known as: FLEXERIL       TAKE these medications    ibuprofen 200 MG tablet Commonly known as: ADVIL Take 200 mg by mouth as needed for moderate pain.   insulin aspart 100 UNIT/ML injection Commonly known as: NovoLOG Max Daily 60 units per pump What changed:  how much to take how to take this when to take this additional instructions   Junel FE 1/20 1-20 MG-MCG tablet Generic drug: norethindrone-ethinyl estradiol-FE Take 1 tablet by mouth daily.   multivitamin with minerals tablet Take 2 tablets by mouth daily.   naproxen sodium 220 MG tablet Commonly known as: ALEVE Take 440 mg by mouth daily as needed (pain).   norethindrone 0.35 MG tablet Commonly known as: MICRONOR Take 1 tablet (0.35 mg total) by mouth daily.   ondansetron 4 MG tablet Commonly known as: ZOFRAN Take 1 tablet (4 mg total) by mouth as needed for nausea or vomiting. What changed: when to take this        Follow-up Information     Elenore Paddy, NP. Schedule an appointment as soon as possible for a visit in 2 week(s).   Specialty: Nurse Practitioner Contact information: [address] [place] [address] [phone]                 Subjective   Pt reports feeling well. She feels that she can tolerate a normal diet. Denies abdominal pain or nausea. She states that she is familiar with updating her insulin pump and needs  no refills on medications. Agrees to follow up with her endocrinologist.  All questions and concerns were addressed at time of discharge.  Objective  Blood pressure (!) 143/92, pulse 99, temperature 98.1 F (36.7 C), temperature source Oral, resp. rate 20, height 5' (1.524 m), weight 60.6 kg, last menstrual period 09/08/2022, SpO2 99%.   General: Pt is alert, awake, not in acute distress Cardiovascular: RRR, S1/S2 +, no rubs, no  gallops Respiratory: CTA bilaterally, no wheezing, no rhonchi Abdominal: Soft, NT, ND, bowel sounds + Extremities: no edema, no cyanosis  The results of significant diagnostics from this hospitalization (including imaging, microbiology, ancillary and laboratory) are listed below for reference.   Imaging studies: CT ABDOMEN PELVIS W CONTRAST  Result Date: 10/15/2022 CLINICAL DATA:  Left lower quadrant abdominal pain EXAM: CT ABDOMEN AND PELVIS WITH CONTRAST TECHNIQUE: Multidetector CT imaging of the abdomen and pelvis was performed using the standard protocol following bolus administration of intravenous contrast. RADIATION DOSE REDUCTION: This exam was performed according to the departmental dose-optimization program which includes automated exposure control, adjustment of the mA and/or kV according to patient size and/or use of iterative reconstruction technique. CONTRAST:  75mL OMNIPAQUE IOHEXOL 350 MG/ML SOLN COMPARISON:  09/04/2022 FINDINGS: Lower chest: Included lung bases are clear.  Heart size is normal. Hepatobiliary: No focal liver abnormality is seen. No gallstones, gallbladder wall thickening, or biliary dilatation. Pancreas: Unremarkable. No pancreatic ductal dilatation or surrounding inflammatory changes. Spleen: Normal in size without focal abnormality. Adrenals/Urinary Tract: Unremarkable adrenal glands. Kidneys enhance symmetrically without focal lesion, stone, or hydronephrosis. Ureters are nondilated. Urinary bladder appears unremarkable for the degree of distention. Stomach/Bowel: Stomach is within normal limits. Appendix appears normal (series 3, image 44). No evidence of bowel wall thickening, distention, or inflammatory changes. Vascular/Lymphatic: No significant vascular findings are present. No enlarged abdominal or pelvic lymph nodes. Reproductive: Uterus and bilateral adnexa are unremarkable. Other: No free fluid. No abdominopelvic fluid collection. No pneumoperitoneum. No  abdominal wall hernia. Musculoskeletal: Stranding and a small amount of air within the subcutaneous fat of the lower anterior abdominal wall, left of midline, most compatible with injection-related changes. No acute bony abnormality. IMPRESSION: 1. No acute abdominopelvic findings. 2. Presumed focal injection-related changes of the anterior abdominal wall. Correlate with history. Electronically Signed   By: Duanne Guess D.O.   On: 10/15/2022 15:21    Labs: Basic Metabolic Panel: Recent Labs  Lab 11/06/22 1936 11/07/22 0248 11/07/22 0539 11/07/22 0845 11/07/22 1237  NA 134* 131* 132* 133* 132*  K 4.2 3.7 3.6 3.7 3.4*  CL 101 100 102 103 102  CO2 17* 19* 22 21* 20*  GLUCOSE 429* 224* 176* 170* 139*  BUN 9 8 7 7 6   CREATININE 0.90 0.71 0.63 0.66 0.66  CALCIUM 9.1 8.5* 8.4* 8.6* 8.3*  MG 1.6*  --  1.8  --   --   PHOS  --   --  2.0*  --   --    CBC: Recent Labs  Lab 11/06/22 1936 11/07/22 0539  WBC 11.0* 11.5*  NEUTROABS  --  10.3*  HGB 12.8 12.5  HCT 39.3 37.9  MCV 89.5 89.8  PLT 438* 447*   Microbiology: Results for orders placed or performed during the hospital encounter of 11/06/22  MRSA Next Gen by PCR, Nasal     Status: None   Collection Time: 11/07/22  2:30 AM   Specimen: Nasal Mucosa; Nasal Swab  Result Value Ref Range Status   MRSA by PCR Next Gen NOT  DETECTED NOT DETECTED Final    Comment: (NOTE) The GeneXpert MRSA Assay (FDA approved for NASAL specimens only), is one component of a comprehensive MRSA colonization surveillance program. It is not intended to diagnose MRSA infection nor to guide or monitor treatment for MRSA infections. Test performance is not FDA approved in patients less than 51 years old. Performed at [facility], [address] [place], [place] [address]    Time coordinating discharge: Over 30 minutes  Leeroy Bock, MD  [facility] 11/07/2022, 5:03 PM

## 2022-11-07 NOTE — Plan of Care (Signed)
Problem: Education: Goal: Ability to describe self-care measures that may prevent or decrease complications (Diabetes Survival Skills Education) will improve Outcome: Adequate for Discharge Goal: Individualized Educational Video(s) Outcome: Adequate for Discharge   Problem: Cardiac: Goal: Ability to maintain an adequate cardiac output will improve Outcome: Adequate for Discharge   Problem: Health Behavior/Discharge Planning: Goal: Ability to identify and utilize available resources and services will improve Outcome: Adequate for Discharge Goal: Ability to manage health-related needs will improve Outcome: Adequate for Discharge   Problem: Fluid Volume: Goal: Ability to achieve a balanced intake and output will improve Outcome: Adequate for Discharge   Problem: Metabolic: Goal: Ability to maintain appropriate glucose levels will improve Outcome: Adequate for Discharge   Problem: Nutritional: Goal: Maintenance of adequate nutrition will improve Outcome: Adequate for Discharge Goal: Maintenance of adequate weight for body size and type will improve Outcome: Adequate for Discharge   Problem: Respiratory: Goal: Will regain and/or maintain adequate ventilation Outcome: Adequate for Discharge   Problem: Urinary Elimination: Goal: Ability to achieve and maintain adequate renal perfusion and functioning will improve Outcome: Adequate for Discharge   Problem: Education: Goal: Ability to describe self-care measures that may prevent or decrease complications (Diabetes Survival Skills Education) will improve Outcome: Adequate for Discharge Goal: Individualized Educational Video(s) Outcome: Adequate for Discharge   Problem: Coping: Goal: Ability to adjust to condition or change in health will improve Outcome: Adequate for Discharge   Problem: Fluid Volume: Goal: Ability to maintain a balanced intake and output will improve Outcome: Adequate for Discharge   Problem: Health  Behavior/Discharge Planning: Goal: Ability to identify and utilize available resources and services will improve Outcome: Adequate for Discharge Goal: Ability to manage health-related needs will improve Outcome: Adequate for Discharge   Problem: Metabolic: Goal: Ability to maintain appropriate glucose levels will improve Outcome: Adequate for Discharge   Problem: Nutritional: Goal: Maintenance of adequate nutrition will improve Outcome: Adequate for Discharge Goal: Progress toward achieving an optimal weight will improve Outcome: Adequate for Discharge   Problem: Skin Integrity: Goal: Risk for impaired skin integrity will decrease Outcome: Adequate for Discharge   Problem: Tissue Perfusion: Goal: Adequacy of tissue perfusion will improve Outcome: Adequate for Discharge   Problem: Education: Goal: Knowledge of General Education information will improve Description: Including pain rating scale, medication(s)/side effects and non-pharmacologic comfort measures Outcome: Adequate for Discharge   Problem: Health Behavior/Discharge Planning: Goal: Ability to manage health-related needs will improve Outcome: Adequate for Discharge   Problem: Clinical Measurements: Goal: Ability to maintain clinical measurements within normal limits will improve Outcome: Adequate for Discharge Goal: Will remain free from infection Outcome: Adequate for Discharge Goal: Diagnostic test results will improve Outcome: Adequate for Discharge Goal: Respiratory complications will improve Outcome: Adequate for Discharge Goal: Cardiovascular complication will be avoided Outcome: Adequate for Discharge   Problem: Activity: Goal: Risk for activity intolerance will decrease Outcome: Adequate for Discharge   Problem: Nutrition: Goal: Adequate nutrition will be maintained Outcome: Adequate for Discharge   Problem: Coping: Goal: Level of anxiety will decrease Outcome: Adequate for Discharge   Problem:  Elimination: Goal: Will not experience complications related to bowel motility Outcome: Adequate for Discharge Goal: Will not experience complications related to urinary retention Outcome: Adequate for Discharge   Problem: Pain Managment: Goal: General experience of comfort will improve Outcome: Adequate for Discharge   Problem: Safety: Goal: Ability to remain free from injury will improve Outcome: Adequate for Discharge   Problem: Skin Integrity: Goal: Risk for impaired skin integrity will decrease   Outcome: Adequate for Discharge   

## 2022-11-08 NOTE — Progress Notes (Signed)
Celso Amy, PA-C [address]  [address]  [place], [place] [address]  Main: [phone]  Fax: [phone]   Gastroenterology Consultation  Referring Provider:     Elenore Paddy, NP Primary Care Physician:  Elenore Paddy, NP Primary Gastroenterologist:  Celso Amy, PA-C / Dr. Wyline Mood   Reason for Consultation:     Nausea, vomiting, abdominal pain        HPI:   Judy Cook is a 22 y.o. y/o female referred for consultation & management  by Elenore Paddy, NP.    She went to [facility] ED for diabetic ketoacidosis 11/06/22.  Currently feeling better.  Originally diagnosed with type 1 diabetes at age 41.  She has had diabetes for 17 years.  Currently followed by endocrinologist Dr. Lonzo Cloud.  Recent A1c of 7.  She has recurrent episodes of nausea and vomiting, especially when she is in DKA.  Nausea and vomiting is relieved after taking hot showers.  Admits to Moderate daily marijuana use for 5 years.  Reports acid reflux after eating spicy food.  GI symptoms for over a year, worse in the past 3 or 4 months.    She has chronic intermittent LLQ pain when she is constipated.  Relieved after bowel movement. She has chronic lifelong constipation since childhood.  Took MiraLAX in the past.  She has never tried Linzess or Trulance.  She has hard stools, straining, and infrequent bowel movements.  Denies rectal bleeding or weight loss.  Negative pregnancy test 10/28/2022.  Had abortion 06/2022.  No previous GI evaluation.  Abdominal Pelvic CT 10/16/2022: IMPRESSION: 1. No acute abdominopelvic findings. 2. Presumed focal injection-related changes of the anterior abdominal wall.    Past Medical History:  Diagnosis Date   Allergic rhinitis 07/07/2021   COVID 2020   mild   Diabetes mellitus (HCC)    Type 1, type 5   Dysmenorrhea 02/20/2019   GERD (gastroesophageal reflux disease)    Headache    History of herpes genitalis 06/24/2021   Intractable vomiting    SIRS (systemic  inflammatory response syndrome) (HCC) 12/03/2019    Past Surgical History:  Procedure Laterality Date   DILATION AND EVACUATION N/A 07/05/2022   Procedure: DILATATION AND EVACUATION;  Surgeon: Lipan Bing, MD;  Location: [facility] OR;  Service: Gynecology;  Laterality: N/A;   OPERATIVE ULTRASOUND N/A 07/05/2022   Procedure: OPERATIVE ULTRASOUND;  Surgeon: Verdigre Bing, MD;  Location: [facility] OR;  Service: Gynecology;  Laterality: N/A;    Prior to Admission medications   Medication Sig Start Date End Date Taking? Authorizing Provider  ibuprofen (ADVIL) 200 MG tablet Take 200 mg by mouth as needed for moderate pain.    [provider]  insulin aspart (NOVOLOG) 100 UNIT/ML injection Max Daily 60 units per pump Patient taking differently: Inject 10 Units into the skin 3 (three) times daily with meals. Via pump Pump is also injecting per sliding scale per pt 06/08/22   Shamleffer, Konrad Dolores, MD  JUNEL FE 1/20 1-20 MG-MCG tablet Take 1 tablet by mouth daily. 11/03/22   [provider]  Multiple Vitamins-Minerals (MULTIVITAMIN WITH MINERALS) tablet Take 2 tablets by mouth daily.    [provider]  naproxen sodium (ALEVE) 220 MG tablet Take 440 mg by mouth daily as needed (pain).    [provider]  norethindrone (MICRONOR) 0.35 MG tablet Take 1 tablet (0.35 mg total) by mouth daily. Patient not taking: Reported on 11/07/2022 09/25/22   Jerene Bears, MD  ondansetron (ZOFRAN) 4 MG tablet Take 1 tablet (4 mg total) by mouth as needed for nausea or vomiting. Patient taking differently: Take 4 mg by mouth every 8 (eight) hours as needed for nausea or vomiting. 11/02/22   Avanell Shackleton, NP-C    Family History  Problem Relation Age of Onset   Diabetes Paternal Grandfather    Heart attack Paternal Grandfather    Diabetes Paternal Grandmother    Cancer Maternal Grandmother        lung   Alcohol abuse Maternal Grandfather    Alcohol abuse Father    Drug abuse  Father    High Cholesterol Father    Alcohol abuse Mother    Depression Mother    Drug abuse Mother      Social History   Tobacco Use   Smoking status: Some Days    Types: Cigarettes   Smokeless tobacco: Never  Vaping Use   Vaping status: Former   Substances: Nicotine, THC  Substance Use Topics   Alcohol use: Yes    Comment: socially   Drug use: Yes    Types: Marijuana    Allergies as of 11/09/2022   (No Known Allergies)    Review of Systems:    All systems reviewed and negative except where noted in HPI.   Physical Exam:  BP 99/62   Pulse 96   Temp 99.5 F (37.5 C)   Ht 5' (1.524 m)   Wt 139 lb (63 kg)   LMP 10/09/2022 (Exact Date)   BMI 27.15 kg/m  Patient's last menstrual period was 10/09/2022 (exact date). Psych:  Alert and cooperative. Normal mood and affect. General:   Alert,  Well-developed, well-nourished, pleasant and cooperative in NAD Head:  Normocephalic and atraumatic. Eyes:  Sclera clear, no icterus.   Conjunctiva pink. Neck:  Supple; no masses or thyromegaly. Lungs:  Respirations even and unlabored.  Clear throughout to auscultation.   No wheezes, crackles, or rhonchi. No acute distress. Heart:  Regular rate and rhythm; no murmurs, clicks, rubs, or gallops. Abdomen:  Normal bowel sounds.  No bruits.  Soft, and non-distended without masses, hepatosplenomegaly or hernias noted.  No Tenderness.  No guarding or rebound tenderness.    Neurologic:  Alert and oriented x3;  grossly normal neurologically. Psych:  Alert and cooperative. Normal mood and affect.  Imaging Studies: CT ABDOMEN PELVIS W CONTRAST  Result Date: 10/15/2022 CLINICAL DATA:  Left lower quadrant abdominal pain EXAM: CT ABDOMEN AND PELVIS WITH CONTRAST TECHNIQUE: Multidetector CT imaging of the abdomen and pelvis was performed using the standard protocol following bolus administration of intravenous contrast. RADIATION DOSE REDUCTION: This exam was performed according to the departmental  dose-optimization program which includes automated exposure control, adjustment of the mA and/or kV according to patient size and/or use of iterative reconstruction technique. CONTRAST:  75mL OMNIPAQUE IOHEXOL 350 MG/ML SOLN COMPARISON:  09/04/2022 FINDINGS: Lower chest: Included lung bases are clear.  Heart size is normal. Hepatobiliary: No focal liver abnormality is seen. No gallstones, gallbladder wall thickening, or biliary dilatation. Pancreas: Unremarkable. No pancreatic ductal dilatation or surrounding inflammatory changes. Spleen: Normal in size without focal abnormality. Adrenals/Urinary Tract: Unremarkable adrenal glands. Kidneys enhance symmetrically without focal lesion, stone, or hydronephrosis. Ureters are nondilated. Urinary bladder appears unremarkable for the degree of distention. Stomach/Bowel: Stomach is within normal limits. Appendix appears normal (series 3, image 44). No evidence of bowel wall thickening, distention, or inflammatory changes. Vascular/Lymphatic: No significant vascular findings are present. No enlarged abdominal or pelvic  lymph nodes. Reproductive: Uterus and bilateral adnexa are unremarkable. Other: No free fluid. No abdominopelvic fluid collection. No pneumoperitoneum. No abdominal wall hernia. Musculoskeletal: Stranding and a small amount of air within the subcutaneous fat of the lower anterior abdominal wall, left of midline, most compatible with injection-related changes. No acute bony abnormality. IMPRESSION: 1. No acute abdominopelvic findings. 2. Presumed focal injection-related changes of the anterior abdominal wall. Correlate with history. Electronically Signed   By: Duanne Guess D.O.   On: 10/15/2022 15:21    Assessment and Plan:   Rubicela Fero is a 22 y.o. y/o female has been referred for:  1.  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  Ordering gastric emptying study to evaluate for  gastroparesis.  Low-fat, low fiber diet with small frequent meals.  Continue Zofran as needed.  Advised complete abstinence from marijuana use.  2.  Chronic lifelong constipation  Gave samples of Linzess 72 mcg QD for 1 week, then 145 mcg QD for 1 week, then 290 mcg QD for 1 week.  She will let me know which dose works best, and then she can call me back for a prescription.   3.  Chronic marijuana use/hyperemesis cannabis syndrome  Encourage complete abstinence from marijuana use.  She can try OTC Salonpas capsaicin patch applied to her abdomen for severe acute symptoms.  4.  Type 1 diabetes for 17 years.  Recent DKA episode treated in hospital 7/15.  Currently feeling better.  Followed by endocrinology.  Follow up in 4 to 6 weeks with TG.  Celso Amy, PA-C

## 2022-11-09 ENCOUNTER — Ambulatory Visit (INDEPENDENT_AMBULATORY_CARE_PROVIDER_SITE_OTHER): Payer: Federal, State, Local not specified - PPO | Admitting: Physician Assistant

## 2022-11-09 ENCOUNTER — Ambulatory Visit: Payer: Self-pay

## 2022-11-09 ENCOUNTER — Encounter: Payer: Self-pay | Admitting: Physician Assistant

## 2022-11-09 VITALS — BP 99/62 | HR 96 | Temp 99.5°F | Ht 60.0 in | Wt 139.0 lb

## 2022-11-09 DIAGNOSIS — R1084 Generalized abdominal pain: Secondary | ICD-10-CM | POA: Diagnosis not present

## 2022-11-09 DIAGNOSIS — K5904 Chronic idiopathic constipation: Secondary | ICD-10-CM | POA: Diagnosis not present

## 2022-11-09 DIAGNOSIS — R112 Nausea with vomiting, unspecified: Secondary | ICD-10-CM | POA: Diagnosis not present

## 2022-11-09 NOTE — Patient Instructions (Addendum)
Gatsric empty study scheduled 11/17/22 @ 8:30 am. Nothing to eat/drink 6 hours prior. [facility] medical mall entrance .  For Constipation Try: Samples of Linzess 72 mcg QD for 1 week,  then 145 mcg QD for 1 week,  then 290 mcg QD for 1 week.   Please let me know which dose works best, and then we can call in a prescription.

## 2022-11-09 NOTE — Patient Instructions (Signed)
Visit Information  Thank you for taking time to visit with me today. Please don't hesitate to contact me if I can be of assistance to you.   Following are the goals we discussed today:  Attend provider visits as scheduled Take medications as prescribed Check Blood sugar frequently Try to keep your blood sugar levels in your target range Contact provider with health questions or concerns as needed   Our next appointment is by telephone on 12/05/22 at 2:00 pm  Please call the care guide team at [phone] if you need to cancel or reschedule your appointment.   If you are experiencing a Mental Health or Behavioral Health Crisis or need someone to talk to, please call the Suicide and Crisis Lifeline: 58   Kathyrn Sheriff, RN, MSN, BSN, CCM [facility] Care Coordinator [phone]

## 2022-11-09 NOTE — Patient Outreach (Signed)
  Care Coordination   Follow Up Visit Note   11/09/2022 Name: Judy Cook MRN: [id number] DOB: [birth year]  Judy Cook is a 22 y.o. year old female who sees Judy Paddy, NP for primary care. I spoke with  Judy Cook by phone today.  What matters to the patients health and wellness today?  Reports last hospitalization was an eye opener for her. She states came on her unexpectantly. She reports she is able to obtain more food resources. Has picked up her  G7 CGM. In addition has been approved for Medicaid again. [name] [facility] for care team assistance. She also acknowledges that drinking "sugary drinks" has contributed to her hospitalizations-She states she will work on this.  Goals Addressed             This Visit's Progress    continue to  improve post hospitalization       Interventions Today    Flowsheet Row Most Recent Value  Chronic Disease   Chronic disease during today's visit Diabetes  General Interventions   General Interventions Discussed/Reviewed General Interventions Reviewed  Doctor Visits Discussed/Reviewed Doctor Visits Discussed, PCP, Specialist  PCP/Specialist Visits Compliance with follow-up visit  [reviewed upcoming appointments]  Education Interventions   Education Provided Provided Education  [advised to check BS often, keep BS levels in target range as much as possible, take medications as prescribed, attend provider visit as scheduled, decrease sugary drinks,  contact provider if health questions/concerns]  Provided Verbal Education On Blood Sugar Monitoring, Medication, When to see the doctor, Nutrition  Nutrition Interventions   Nutrition Discussed/Reviewed Nutrition Discussed  [reports has food resources]  Pharmacy Interventions   Pharmacy Dicussed/Reviewed Pharmacy Topics Reviewed            SDOH assessments and interventions completed:  No    Care Coordination Interventions:  Yes, provided    Follow up plan: Follow up call scheduled for 12/05/22    Encounter Outcome:  Pt. Visit Completed   Kathyrn Sheriff, RN, MSN, BSN, CCM [facility] Care Coordinator [phone]

## 2022-11-17 ENCOUNTER — Encounter: Admission: RE | Admit: 2022-11-17 | Payer: Federal, State, Local not specified - PPO | Source: Ambulatory Visit

## 2022-11-20 ENCOUNTER — Encounter: Payer: Self-pay | Admitting: Internal Medicine

## 2022-11-20 ENCOUNTER — Telehealth (INDEPENDENT_AMBULATORY_CARE_PROVIDER_SITE_OTHER): Payer: Federal, State, Local not specified - PPO | Admitting: Internal Medicine

## 2022-11-20 ENCOUNTER — Telehealth: Payer: Self-pay | Admitting: Internal Medicine

## 2022-11-20 VITALS — Ht 60.0 in

## 2022-11-20 DIAGNOSIS — R739 Hyperglycemia, unspecified: Secondary | ICD-10-CM | POA: Insufficient documentation

## 2022-11-20 DIAGNOSIS — E1065 Type 1 diabetes mellitus with hyperglycemia: Secondary | ICD-10-CM

## 2022-11-20 DIAGNOSIS — Z4681 Encounter for fitting and adjustment of insulin pump: Secondary | ICD-10-CM

## 2022-11-20 MED ORDER — FREESTYLE LIBRE 2 SENSOR MISC: 1.0000 | 3 refills | Status: DC

## 2022-11-20 NOTE — Telephone Encounter (Signed)
Please schedule the patient to see me in 3 months or put on the waiting list in 3 months.   Thanks

## 2022-11-20 NOTE — Progress Notes (Signed)
Virtual Visit via Video Note  I connected with Judy Cook on 11/20/22 at 9:30 by a video enabled telemedicine application and verified that I am speaking with the correct person using two identifiers.   I discussed the limitations of evaluation and management by telemedicine and the availability of in person appointments. The patient expressed understanding and agreed to proceed.   -Location of the patient : home  -Location of the provider : Office -The names of all persons participating in the telemedicine service : Pt and myself        Name: Judy Cook  MRN/ DOB: [id number], [birth year]   Age/ Sex: 22 y.o., female    PCP: Elenore Paddy, NP   Reason for Endocrinology Evaluation: Type 1 Diabetes Mellitus     Date of Initial Endocrinology Visit: 07/26/2021    PATIENT IDENTIFIER: Judy Cook is a 22 y.o. female with a past medical history of T1DM. The patient presented for initial endocrinology clinic visit on 07/26/2021  for consultative assistance with her diabetes management.    HPI: Judy Cook was    Diagnosed with DM at age 80               Hemoglobin A1c has ranged from 10.7% in 2023, peaking at 12% in 2021. Patient required assistance for hypoglycemia: when she was young Patient has required hospitalization within the last 1 year from hyper or hypoglycemia: Patient with history of DKA but not in 2022  On her initial visit to our clinic she had an A1c of 10.7%, she was referred for pump training on the Tandem 10/2021     SUBJECTIVE:   During the last visit (06/08/2022): A1c 7.3% .   Today (11/20/22): Judy Cook is here for a follow up on diabetes management. She checks her blood sugars multiple  times daily through CGM. The patient has  had hypoglycemic episodes since the last clinic visit.  Patient presented to the ED multiple times since her last visit here due to variable reasons including intractable/cyclical  vomiting with marijuana use, D&C 06/2022, and multiple episodes of DKA as well as alcohol intoxication 09/2022  She was seen by GI for nausea and vomiting, has a pending gastric emptying study  Today she continues to complain about nausea but no vomiting she also has noted constipation   This patient with type 1 diabetes is treated with Tandem  (insulin pump). During the visit the pump basal and bolus doses were reviewed including carb/insulin rations and supplemental doses. The clinical list was updated. The glucose meter download was reviewed in detail to determine if the current pump settings are providing the best glycemic control without excessive hypoglycemia.  Pump and meter download:     Pump   Tandem  Settings   Insulin type   Novolog    Basal rate       0000 0.6 u/h               I:C ratio       0000 1:1    #4  Grams with a meal,# 2 g with a snack              Sensitivity       0000  45      Goal       0000  120           Type & Model of Pump: Tandem  Insulin Type: Currently using NovoLog.  There is no height or weight  on file to calculate BMI.  PUMP STATISTICS: Average BG: 251 Average Daily Carbs (g): 9 Average Total Daily Insulin: 46.22 Average Daily Basal: 13.79 (30 %) Average Daily Bolus: 32.44(70%)      HOME DIABETES REGIMEN:  Novolog    Statin: no ACE-I/ARB: no Prior Diabetic Education: yes   CONTINUOUS GLUCOSE MONITORING RECORD INTERPRETATION    Dates of Recording:7/2-7/29/2024  Sensor description:dexcom  Results statistics:   CGM use % of time 6  Average and SD 251  Time in range 11%  % Time Above 180 89  % Time Below target 1    Glycemic patterns summary: BG's are elevated throughout the day and the night Hyperglycemic episodes all day and night  Hypoglycemic episodes occurred after manual bolus  Overnight periods: High     DIABETIC COMPLICATIONS: Microvascular complications:   Denies: CKD, retinopathy,  neuropathy  Last eye exam: Completed years   Macrovascular complications:   Denies: CAD, PVD, CVA   PAST HISTORY: Past Medical History:  Past Medical History:  Diagnosis Date   Allergic rhinitis 07/07/2021   COVID 2020   mild   Diabetes mellitus (HCC)    Type 1, type 5   Dysmenorrhea 02/20/2019   GERD (gastroesophageal reflux disease)    Headache    History of herpes genitalis 06/24/2021   Intractable vomiting    SIRS (systemic inflammatory response syndrome) (HCC) 12/03/2019   Past Surgical History:  Past Surgical History:  Procedure Laterality Date   DILATION AND EVACUATION N/A 07/05/2022   Procedure: DILATATION AND EVACUATION;  Surgeon: [place] Bing, MD;  Location: [facility] OR;  Service: Gynecology;  Laterality: N/A;   OPERATIVE ULTRASOUND N/A 07/05/2022   Procedure: OPERATIVE ULTRASOUND;  Surgeon:  Bing, MD;  Location: [facility] OR;  Service: Gynecology;  Laterality: N/A;    Social History:  reports that she has been smoking cigarettes. She has never used smokeless tobacco. She reports current alcohol use. She reports current drug use. Drug: Marijuana. Family History:  Family History  Problem Relation Age of Onset   Diabetes Paternal Grandfather    Heart attack Paternal Grandfather    Diabetes Paternal Grandmother    Cancer Maternal Grandmother        lung   Alcohol abuse Maternal Grandfather    Alcohol abuse Father    Drug abuse Father    High Cholesterol Father    Alcohol abuse Mother    Depression Mother    Drug abuse Mother      HOME MEDICATIONS: Allergies as of 11/20/2022   No Known Allergies      Medication List        Accurate as of November 20, 2022  6:59 AM. If you have any questions, ask your nurse or doctor.          ibuprofen 200 MG tablet Commonly known as: ADVIL Take 200 mg by mouth as needed for moderate pain.   insulin aspart 100 UNIT/ML injection Commonly known as: NovoLOG Max Daily 60 units per pump What changed:  how much to  take how to take this when to take this additional instructions   Junel FE 1/20 1-20 MG-MCG tablet Generic drug: norethindrone-ethinyl estradiol-FE Take 1 tablet by mouth daily.   multivitamin with minerals tablet Take 2 tablets by mouth daily.   naproxen sodium 220 MG tablet Commonly known as: ALEVE Take 440 mg by mouth daily as needed (pain).   ondansetron 4 MG tablet Commonly known as: ZOFRAN Take 1 tablet (4 mg total) by mouth as  needed for nausea or vomiting. What changed: when to take this         ALLERGIES: No Known Allergies   REVIEW OF SYSTEMS: A comprehensive ROS was conducted with the patient and is negative except as per HPI and below:      OBJECTIVE:   VITAL SIGNS: LMP 10/09/2022 (Exact Date)    PHYSICAL EXAM:  General: Pt is tearful    DM foot exam: 06/08/2022  The skin of the feet is intact without sores or ulcerations. The pedal pulses are 2+ on right and 2+ on left. The sensation is intact to a screening 5.07, 10 gram monofilament bilaterally    DATA REVIEWED:  Lab Results  Component Value Date   HGBA1C 7.6 (H) 09/05/2022   HGBA1C 7.3 (A) 06/08/2022   HGBA1C 8.7 (A) 12/05/2021     ASSESSMENT / PLAN / RECOMMENDATIONS:   1) Type 1 Diabetes Mellitus, Sub-optimally controlled, With out complications - Most recent A1c of 7.6%. Goal A1c <7.0%.      -Patient continues with hyperglycemia and multiple DKA admissions -Per pharmacist note, Dexcom was cost prohibitive, I discussed with the patient switching to freestyle libre 2+, to see if that would be cheaper for her. -A prescription has been sent to the pharmacy and the patient will compare prices -The patient is very frustrated with her diabetes care and multiple hospitalization, we also discussed taking a break from the pump and going back to multiple daily injections but she would rather stay on the pump -I again emphasized the importance of bolusing for carbohydrates, since she is not  counting carbohydrates, I have given her a set dose as below to continue to use -I will increase her basal rate as below -Patient advised to avoid manual bolusing as this can result in overcorrection or under correction -Patient will be advised to turn the IQ technology back on -Patient advised to avoid EtOH intake as it increases the risk of hyper and hypoglycemia with insulin intake   Pump   Tandem  Settings   Insulin type   Novolog    Basal rate       0000 0.8 u/h               I:C ratio       0000 1:1    #6  g with a meal,# 3 g with a snack              Sensitivity       0000  45      Goal       0000  120          MEDICATIONS:   Novolog    EDUCATION / INSTRUCTIONS: BG monitoring instructions: Patient is instructed to check her blood sugars 3  x daily before meals   Call [facility] if: BG persistently < 70  I reviewed the Rule of 15 for the treatment of hypoglycemia in detail with the patient. Literature supplied.   2) Diabetic complications:  Eye: Does not have known diabetic retinopathy.  Neuro/ Feet: Does not have known diabetic peripheral neuropathy. Renal: Patient does not have known baseline CKD. She is not on an ACEI/ARB at present.    3) Lipids: No indication for statin therapy at this time    4) Stress and anxiety :  -Patient is very frustrated with her diabetes management as well as recurrent hospitalization -I have referred her to psychology department for further evaluation and management  5) Constipation:  -Patient to start OTC stool softeners and encouraged hydration  Follow-up in 3 months    Signed electronically by: Lyndle Herrlich, MD  [facility] Endocrinology  [facility] [address] [place] [place], [place] [address] Phone: [phone] FAX: [phone]   CC: Elenore Paddy, NP [address] [place] [address] Phone: [phone]  Fax: [phone]    Return to  Endocrinology clinic as below: Future Appointments  Date Time Provider Department Center  11/20/2022  9:30 AM Janeil Schexnayder, Konrad Dolores, MD [facility] None  11/24/2022  8:30 AM [facility]  12/05/2022  2:00 PM Colletta Maryland, RN [facility] None  12/21/2022 11:15 AM Celso Amy, PA-C [facility] None  12/26/2022  2:00 PM Soundra Pilon, LCSW [facility] None  01/01/2023  9:00 AM [facility] NURSE [facility]  01/18/2023 10:00 AM Elenore Paddy, NP [facility] None

## 2022-11-24 ENCOUNTER — Encounter
Admission: RE | Admit: 2022-11-24 | Discharge: 2022-11-24 | Disposition: A | Discharge status: Home / Self Care | Payer: Federal, State, Local not specified - PPO | Source: Ambulatory Visit | Attending: Physician Assistant | Admitting: Physician Assistant

## 2022-11-24 DIAGNOSIS — R112 Nausea with vomiting, unspecified: Secondary | ICD-10-CM | POA: Diagnosis not present

## 2022-11-24 DIAGNOSIS — Z0389 Encounter for observation for other suspected diseases and conditions ruled out: Secondary | ICD-10-CM | POA: Diagnosis not present

## 2022-11-24 MED ORDER — TECHNETIUM TC 99M SULFUR COLLOID
2.0000 | Freq: Once | INTRAVENOUS | Status: AC
  Administered 2022-11-24: 2.07 via ORAL

## 2022-11-27 ENCOUNTER — Telehealth: Payer: Self-pay

## 2022-11-27 ENCOUNTER — Encounter: Payer: Self-pay | Admitting: Internal Medicine

## 2022-11-27 ENCOUNTER — Other Ambulatory Visit: Payer: Self-pay

## 2022-11-27 MED ORDER — METOCLOPRAMIDE HCL 5 MG PO TABS: 5.0000 mg | ORAL_TABLET | Freq: Three times a day (TID) | ORAL | 1 refills | Status: DC

## 2022-11-27 MED ORDER — DEXCOM G7 SENSOR MISC: 3 refills | Status: DC

## 2022-11-27 NOTE — Progress Notes (Signed)
Gastric emptying test shows delayed stomach emptying at 3 and 4 hours.  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  I recommend low-fat, and low fiber diet with small frequent meals every 2 or 3 hours.  I recommend patient look up gastroparesis diet on [facility] website or Web http://wise.org/.  I can also prescribe Reglan (metoclopramide) 5 mg 1 tablet 3 times daily before meals to help with nausea, #90, 1 refill.  Keep follow-up as scheduled.

## 2022-11-27 NOTE — Telephone Encounter (Signed)
Patient notified and RX sent to pharmacy.   Gastric emptying test shows delayed stomach emptying at 3 and 4 hours.  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  I recommend low-fat, and low fiber diet with small frequent meals every 2 or 3 hours.  I recommend patient look up gastroparesis diet on [facility] website or Web http://wise.org/.  I can also prescribe Reglan (metoclopramide) 5 mg 1 tablet 3 times daily before meals to help with nausea, #90, 1 refill.  Keep follow-up as scheduled.

## 2022-11-28 DIAGNOSIS — Z113 Encounter for screening for infections with a predominantly sexual mode of transmission: Secondary | ICD-10-CM | POA: Diagnosis not present

## 2022-11-28 DIAGNOSIS — N760 Acute vaginitis: Secondary | ICD-10-CM | POA: Diagnosis not present

## 2022-12-04 ENCOUNTER — Ambulatory Visit: Payer: Federal, State, Local not specified - PPO | Admitting: Physician Assistant

## 2022-12-05 ENCOUNTER — Telehealth: Payer: Self-pay

## 2022-12-05 NOTE — Patient Outreach (Signed)
  Care Coordination   12/05/2022 Name: Judy Cook MRN: [id number] DOB: 10-01-2000   Care Coordination Outreach Attempts:  An unsuccessful telephone outreach was attempted for a scheduled appointment today.  Follow Up Plan:  Additional outreach attempts will be made to offer the patient care coordination information and services.   Encounter Outcome:  No Answer   Care Coordination Interventions:  No, not indicated    Kathyrn Sheriff, RN, MSN, BSN, CCM [facility] Coordinator [phone]

## 2022-12-06 ENCOUNTER — Other Ambulatory Visit: Payer: Self-pay | Admitting: Family Medicine

## 2022-12-07 ENCOUNTER — Encounter (INDEPENDENT_AMBULATORY_CARE_PROVIDER_SITE_OTHER): Payer: Self-pay

## 2022-12-07 ENCOUNTER — Ambulatory Visit: Payer: Federal, State, Local not specified - PPO | Admitting: Internal Medicine

## 2022-12-18 ENCOUNTER — Ambulatory Visit: Payer: Federal, State, Local not specified - PPO | Admitting: Physician Assistant

## 2022-12-20 ENCOUNTER — Other Ambulatory Visit: Payer: Self-pay

## 2022-12-20 NOTE — Progress Notes (Signed)
Celso Amy, PA-C [address]  [address]  [place], [place] [address]  Main: [phone]  Fax: [phone]   Primary Care Physician: Elenore Paddy, NP  Primary Gastroenterologist:  Celso Amy, PA-C   CC: F/U Nausea, Vomiting, Diabetic Gastroparesis, constipation  HPI: Judy Cook is a 22 y.o. female turns for follow-up of chronic nausea and vomiting, diabetic gastroparesis, hyperemesis cannabis syndrome, and chronic constipation.  Gastric emptying study 11/24/2022 showed delayed gastric emptying consistent with gastroparesis.  26% emptied at 1 hour, 40% at 2 hours, 41% at 3 hours, 70% at 4 hours.  She was given gastroparesis diet and started on Reglan metoclopramide 5 Mg 1 tablet 3 times daily before meals with benefit.  Still using marijuana.  Current symptoms: She is currently feeling a lot better.  Has not had any more nausea, vomiting, or abdominal pain.  Constipation improved on Linzess 72.  She tried Linzess 145 and stools were a little loose.  Currently having bowel movement daily.   No previous EGD.  Negative pregnancy test 10/28/2022.    She went to [facility] ED for diabetic ketoacidosis 11/06/22.   Originally diagnosed with type 1 diabetes at age 7.  She has had diabetes for 17 years.  Currently followed by endocrinologist Dr. Lonzo Cloud.  Recent A1c of 7.   Abdominal Pelvic CT 10/16/2022 showed No acute abdominopelvic findings.   Current Outpatient Medications  Medication Sig Dispense Refill   Continuous Glucose Sensor (DEXCOM G7 SENSOR) MISC Change every 10 days 9 each 3   ibuprofen (ADVIL) 200 MG tablet Take 200 mg by mouth as needed for moderate pain.     insulin aspart (NOVOLOG) 100 UNIT/ML injection Max Daily 60 units per pump (Patient taking differently: Inject 10 Units into the skin 3 (three) times daily with meals. Via pump Pump is also injecting per sliding scale per pt) 60 mL 11   metoCLOPramide (REGLAN) 5 MG tablet Take 1 tablet (5 mg total) by mouth  3 (three) times daily. 90 tablet 1   Multiple Vitamins-Minerals (MULTIVITAMIN WITH MINERALS) tablet Take 2 tablets by mouth daily.     naproxen sodium (ALEVE) 220 MG tablet Take 440 mg by mouth daily as needed (pain).     norethindrone (MICRONOR) 0.35 MG tablet Take 1 tablet by mouth daily.     ondansetron (ZOFRAN) 4 MG tablet Take 1 tablet (4 mg total) by mouth as needed for nausea or vomiting. (Patient taking differently: Take 4 mg by mouth every 8 (eight) hours as needed for nausea or vomiting.) 10 tablet 0   No current facility-administered medications for this visit.    Allergies as of 12/21/2022   (No Known Allergies)    Past Medical History:  Diagnosis Date   Allergic rhinitis 07/07/2021   COVID 2020   mild   Diabetes mellitus (HCC)    Type 1, type 5   Dysmenorrhea 02/20/2019   GERD (gastroesophageal reflux disease)    Headache    History of herpes genitalis 06/24/2021   Intractable vomiting    SIRS (systemic inflammatory response syndrome) (HCC) 12/03/2019    Past Surgical History:  Procedure Laterality Date   DILATION AND EVACUATION N/A 07/05/2022   Procedure: DILATATION AND EVACUATION;  Surgeon: [place] Bing, MD;  Location: [facility] OR;  Service: Gynecology;  Laterality: N/A;   OPERATIVE ULTRASOUND N/A 07/05/2022   Procedure: OPERATIVE ULTRASOUND;  Surgeon: [place] Bing, MD;  Location: [facility] OR;  Service: Gynecology;  Laterality: N/A;    Review of Systems:  All systems reviewed and negative except where noted in HPI.   Physical Examination:   BP 127/88 (BP Location: Left Arm, Patient Position: Sitting, Cuff Size: Normal)   Pulse 86   Temp 98.4 F (36.9 C) (Oral)   Ht 5' (1.524 m)   Wt 135 lb (61.2 kg)   LMP 11/18/2022 (Exact Date) Comment: NOT PREGNANT OR BREASTFEEDING.  BMI 26.37 kg/m   General: Well-nourished, well-developed in no acute distress.  Lungs: Clear to auscultation bilaterally. Non-labored. Heart: Regular rate and rhythm, no murmurs rubs or  gallops.  Abdomen: Bowel sounds are normal; Abdomen is Soft; No hepatosplenomegaly, masses or hernias;  No Abdominal Tenderness; No guarding or rebound tenderness. Neuro: Alert and oriented x 3.  Grossly intact.  Psych: Alert and cooperative, normal mood and affect.   Imaging Studies: NM Gastric Emptying  Result Date: 11/24/2022 CLINICAL DATA:  Concern for gastroparesis. EXAM: NUCLEAR MEDICINE GASTRIC EMPTYING SCAN TECHNIQUE: After oral ingestion of radiolabeled meal, sequential abdominal images were obtained for 4 hours. Percentage of activity emptying the stomach was calculated at 1 hour, 2 hour, 3 hour, and 4 hours. RADIOPHARMACEUTICALS:  2.1 mCi Tc-67m sulfur colloid in standardized meal COMPARISON:  CT October 15, 2022 FINDINGS: Expected location of the stomach in the left upper quadrant. Ingested meal empties the stomach gradually over the course of the study. 26% emptied at 1 hr ( normal >= 10%) 40% emptied at 2 hr ( normal >= 40%) 41% emptied at 3 hr ( normal >= 70%) 70% emptied at 4 hr ( normal >= 90%) IMPRESSION: Scintigraphic findings compatible with delayed gastric emptying. Electronically Signed   By: Maudry Mayhew M.D.   On: 11/24/2022 15:20    Assessment and Plan:   Judy Cook is a 22 y.o. y/o female returns for follow-up of chronic nausea and vomiting.  Due to diabetic gastroparesis and Hyperemesis cannabis syndrome.  She is feeling a lot better on Reglan 5 Mg 3 times daily.  Also here for follow-up of chronic constipation, improved on Linzess 72 mcg daily.  No recent episodes of nausea or vomiting.  1.  Diabetic gastroparesis due to Type 1 Diabetes  Patient education handout was given from up-to-date.  Recommend a low-fat low fiber diet.  Gastroparesis diet handout was given.  Eat small frequent meals 5 times daily.  Continue Reglan 5 mg 1 tablet 3 times daily before meals as needed.  I warned patient about adverse side effects of Reglan (metoclopramide) to include  tardive dyskinesia.  Stressed the importance of tight blood sugar diabetic control and she will continue to follow-up with her endocrinologist.  2.  Hyperemesis Cannabis Syndrome  I stressed the importance of stopping all marijuana use.  Patient expressed understanding  3.  Chronic Constipation - Improved on Linzess 72  Continue Linzess 72 mcg 1 capsule once daily.  Celso Amy, PA-C  Follow up in 6 months or sooner if symptoms return.

## 2022-12-21 ENCOUNTER — Encounter: Payer: Self-pay | Admitting: Physician Assistant

## 2022-12-21 ENCOUNTER — Ambulatory Visit: Payer: Federal, State, Local not specified - PPO | Admitting: Physician Assistant

## 2022-12-21 ENCOUNTER — Telehealth: Payer: Self-pay

## 2022-12-21 ENCOUNTER — Ambulatory Visit (INDEPENDENT_AMBULATORY_CARE_PROVIDER_SITE_OTHER): Payer: Federal, State, Local not specified - PPO | Admitting: Physician Assistant

## 2022-12-21 VITALS — BP 127/88 | HR 86 | Temp 98.4°F | Ht 60.0 in | Wt 135.0 lb

## 2022-12-21 DIAGNOSIS — K5904 Chronic idiopathic constipation: Secondary | ICD-10-CM | POA: Diagnosis not present

## 2022-12-21 DIAGNOSIS — K3184 Gastroparesis: Secondary | ICD-10-CM

## 2022-12-21 DIAGNOSIS — E1043 Type 1 diabetes mellitus with diabetic autonomic (poly)neuropathy: Secondary | ICD-10-CM

## 2022-12-21 DIAGNOSIS — F12188 Cannabis abuse with other cannabis-induced disorder: Secondary | ICD-10-CM

## 2022-12-21 DIAGNOSIS — E1065 Type 1 diabetes mellitus with hyperglycemia: Secondary | ICD-10-CM | POA: Diagnosis not present

## 2022-12-21 MED ORDER — ONDANSETRON HCL 4 MG PO TABS
4.0000 mg | ORAL_TABLET | Freq: Three times a day (TID) | ORAL | 5 refills | Status: AC | PRN
Start: 2022-12-21 — End: 2023-06-19

## 2022-12-21 MED ORDER — METOCLOPRAMIDE HCL 5 MG PO TABS: 5.0000 mg | ORAL_TABLET | Freq: Three times a day (TID) | ORAL | 5 refills | Status: DC

## 2022-12-21 MED ORDER — LINACLOTIDE 72 MCG PO CAPS
72.0000 ug | ORAL_CAPSULE | Freq: Every day | ORAL | 1 refills | Status: DC
Start: 2022-12-21 — End: 2023-05-04

## 2022-12-21 NOTE — Telephone Encounter (Signed)
Submitted PA through cover my meds for Linzess . Waiting on response from insurance company

## 2022-12-21 NOTE — Telephone Encounter (Signed)
They have approved medication from 12/21/2022 to 12/21/2023

## 2022-12-26 ENCOUNTER — Emergency Department (HOSPITAL_COMMUNITY)
Admission: EM | Admit: 2022-12-26 | Discharge: 2022-12-26 | Disposition: A | Discharge status: Home / Self Care | Payer: Federal, State, Local not specified - PPO | Source: Home / Self Care | Attending: Emergency Medicine | Admitting: Emergency Medicine

## 2022-12-26 ENCOUNTER — Encounter: Payer: Self-pay | Admitting: Licensed Clinical Social Worker

## 2022-12-26 ENCOUNTER — Other Ambulatory Visit: Payer: Self-pay

## 2022-12-26 ENCOUNTER — Telehealth: Payer: Self-pay | Admitting: Licensed Clinical Social Worker

## 2022-12-26 ENCOUNTER — Encounter (HOSPITAL_COMMUNITY): Payer: Self-pay

## 2022-12-26 DIAGNOSIS — Z794 Long term (current) use of insulin: Secondary | ICD-10-CM | POA: Insufficient documentation

## 2022-12-26 DIAGNOSIS — E876 Hypokalemia: Secondary | ICD-10-CM | POA: Insufficient documentation

## 2022-12-26 DIAGNOSIS — R1030 Lower abdominal pain, unspecified: Secondary | ICD-10-CM | POA: Diagnosis not present

## 2022-12-26 DIAGNOSIS — R1084 Generalized abdominal pain: Secondary | ICD-10-CM

## 2022-12-26 DIAGNOSIS — R112 Nausea with vomiting, unspecified: Secondary | ICD-10-CM | POA: Insufficient documentation

## 2022-12-26 DIAGNOSIS — R739 Hyperglycemia, unspecified: Secondary | ICD-10-CM | POA: Diagnosis not present

## 2022-12-26 DIAGNOSIS — K3184 Gastroparesis: Secondary | ICD-10-CM | POA: Diagnosis not present

## 2022-12-26 DIAGNOSIS — E1065 Type 1 diabetes mellitus with hyperglycemia: Secondary | ICD-10-CM | POA: Insufficient documentation

## 2022-12-26 DIAGNOSIS — I10 Essential (primary) hypertension: Secondary | ICD-10-CM | POA: Diagnosis not present

## 2022-12-26 LAB — CBC WITH DIFFERENTIAL/PLATELET
Abs Immature Granulocytes: 0.03 10*3/uL (ref 0.00–0.07)
Basophils Absolute: 0 10*3/uL (ref 0.0–0.1)
Basophils Relative: 0 %
Eosinophils Absolute: 0.1 10*3/uL (ref 0.0–0.5)
Eosinophils Relative: 1 %
HCT: 41.2 % (ref 36.0–46.0)
Hemoglobin: 14.2 g/dL (ref 12.0–15.0)
Immature Granulocytes: 0 %
Lymphocytes Relative: 16 %
Lymphs Abs: 1.3 10*3/uL (ref 0.7–4.0)
MCH: 31.5 pg (ref 26.0–34.0)
MCHC: 34.5 g/dL (ref 30.0–36.0)
MCV: 91.4 fL (ref 80.0–100.0)
Monocytes Absolute: 0.4 10*3/uL (ref 0.1–1.0)
Monocytes Relative: 5 %
Neutro Abs: 6.5 10*3/uL (ref 1.7–7.7)
Neutrophils Relative %: 78 %
Platelets: 402 10*3/uL — ABNORMAL HIGH (ref 150–400)
RBC: 4.51 MIL/uL (ref 3.87–5.11)
RDW: 13 % (ref 11.5–15.5)
WBC: 8.3 10*3/uL (ref 4.0–10.5)
nRBC: 0 % (ref 0.0–0.2)

## 2022-12-26 LAB — URINALYSIS, ROUTINE W REFLEX MICROSCOPIC
Bacteria, UA: NONE SEEN
Bilirubin Urine: NEGATIVE
Glucose, UA: >=500 mg/dL — AB
Hgb urine dipstick: NEGATIVE
Ketones, ur: 20 mg/dL — AB
Leukocytes,Ua: NEGATIVE
Nitrite: NEGATIVE
Protein, ur: 30 mg/dL — AB
Specific Gravity, Urine: 1.013 (ref 1.005–1.030)
pH: 9 — ABNORMAL HIGH (ref 5.0–8.0)

## 2022-12-26 LAB — BLOOD GAS, VENOUS
Acid-Base Excess: 0.3 mmol/L (ref 0.0–2.0)
Bicarbonate: 24.6 mmol/L (ref 20.0–28.0)
O2 Saturation: 83.4 %
Patient temperature: 37
pCO2, Ven: 38 mmHg — ABNORMAL LOW (ref 44–60)
pH, Ven: 7.42 (ref 7.25–7.43)
pO2, Ven: 47 mmHg — ABNORMAL HIGH (ref 32–45)

## 2022-12-26 LAB — COMPREHENSIVE METABOLIC PANEL
ALT: 20 U/L (ref 0–44)
AST: 26 U/L (ref 15–41)
Albumin: 4.5 g/dL (ref 3.5–5.0)
Alkaline Phosphatase: 68 U/L (ref 38–126)
Anion gap: 13 (ref 5–15)
BUN: 10 mg/dL (ref 6–20)
CO2: 20 mmol/L — ABNORMAL LOW (ref 22–32)
Calcium: 9.3 mg/dL (ref 8.9–10.3)
Chloride: 105 mmol/L (ref 98–111)
Creatinine, Ser: 0.78 mg/dL (ref 0.44–1.00)
GFR, Estimated: >60 mL/min (ref >60)
Glucose, Bld: 164 mg/dL — ABNORMAL HIGH (ref 70–99)
Potassium: 3.2 mmol/L — ABNORMAL LOW (ref 3.5–5.1)
Sodium: 138 mmol/L (ref 135–145)
Total Bilirubin: 0.3 mg/dL (ref 0.3–1.2)
Total Protein: 8 g/dL (ref 6.5–8.1)

## 2022-12-26 LAB — CBG MONITORING, ED: Glucose-Capillary: 232 mg/dL — ABNORMAL HIGH (ref 70–99)

## 2022-12-26 LAB — LIPASE, BLOOD: Lipase: 22 U/L (ref 11–51)

## 2022-12-26 LAB — HCG, SERUM, QUALITATIVE: Preg, Serum: NEGATIVE

## 2022-12-26 MED ORDER — HYOSCYAMINE SULFATE 0.125 MG SL SUBL
0.2500 mg | SUBLINGUAL_TABLET | Freq: Once | SUBLINGUAL | Status: AC
  Administered 2022-12-26: 0.25 mg via ORAL
  Filled 2022-12-26: qty 2

## 2022-12-26 MED ORDER — HYOSCYAMINE SULFATE 0.125 MG PO TABS: 0.2500 mg | ORAL_TABLET | Freq: Once | ORAL | Status: DC

## 2022-12-26 MED ORDER — SODIUM CHLORIDE 0.9 % IV BOLUS (SEPSIS)
1000.0000 mL | Freq: Once | INTRAVENOUS | Status: AC
  Administered 2022-12-26: 1000 mL via INTRAVENOUS

## 2022-12-26 MED ORDER — SODIUM CHLORIDE 0.9 % IV SOLN: 1000.0000 mL | INTRAVENOUS | Status: DC

## 2022-12-26 MED ORDER — ALUM & MAG HYDROXIDE-SIMETH 200-200-20 MG/5ML PO SUSP
30.0000 mL | Freq: Once | ORAL | Status: AC
  Administered 2022-12-26: 30 mL via ORAL
  Filled 2022-12-26: qty 30

## 2022-12-26 MED ORDER — DROPERIDOL 2.5 MG/ML IJ SOLN
2.5000 mg | Freq: Once | INTRAMUSCULAR | Status: AC
  Administered 2022-12-26: 2.5 mg via INTRAVENOUS
  Filled 2022-12-26: qty 2

## 2022-12-26 MED ORDER — METOCLOPRAMIDE HCL 5 MG/ML IJ SOLN
10.0000 mg | Freq: Once | INTRAMUSCULAR | Status: AC
  Administered 2022-12-26: 10 mg via INTRAVENOUS
  Filled 2022-12-26: qty 2

## 2022-12-26 MED ORDER — KETOROLAC TROMETHAMINE 15 MG/ML IJ SOLN
15.0000 mg | Freq: Once | INTRAMUSCULAR | Status: AC
  Administered 2022-12-26: 15 mg via INTRAVENOUS
  Filled 2022-12-26: qty 1

## 2022-12-26 NOTE — Patient Instructions (Signed)
  I am sorry you were unable to keep your phone appointment today.   Please call me to reschedule  Deborah Moore, LCSW Social Work Care Coordination  [phone]  

## 2022-12-26 NOTE — ED Provider Notes (Signed)
[place] EMERGENCY DEPARTMENT AT [facility] Provider Note  CSN: [id number] Arrival date & time: 12/26/22 2841  Chief Complaint(s) Abdominal Pain and Emesis  HPI Judy Cook is a 22 y.o. female with a past medical history listed below including type 1 diabetes on insulin who presents to the emergency department with generalized abdominal pain with nausea and vomiting.  This began around 2 AM this morning.  Patient reports noting low blood sugars around 69 prior to onset of symptoms.  States that she was able to bring it up by eating a corn dog.  After eating, the pain began.  Patient reports similar episodes in the past related to marijuana use.  States that she still smoking marijuana.  The history is provided by the patient.    Past Medical History Past Medical History:  Diagnosis Date   Allergic rhinitis 07/07/2021   COVID 2020   mild   Diabetes mellitus (HCC)    Type 1, type 5   Dysmenorrhea 02/20/2019   GERD (gastroesophageal reflux disease)    Headache    History of herpes genitalis 06/24/2021   Intractable vomiting    SIRS (systemic inflammatory response syndrome) (HCC) 12/03/2019   Patient Active Problem List   Diagnosis Date Noted   Insulin pump titration 11/20/2022   Stress diabetes 11/20/2022   Type 1 diabetes mellitus with hyperglycemia (HCC) 11/20/2022   DKA (diabetic ketoacidosis) (HCC) 11/06/2022   Hospital discharge follow-up 11/02/2022   DKA, type 1, not at goal [facility]) 11/02/2022   DKA, type 1 (HCC) 10/14/2022   Leukocytosis 10/14/2022   Hypocalcemia 10/14/2022   Hyponatremia 10/14/2022   Left lower quadrant abdominal pain 10/14/2022   GERD (gastroesophageal reflux disease) 10/14/2022   Colitis 09/21/2022   H/O diabetic ketoacidosis 09/04/2022   Rh negative state in antepartum period 07/05/2022   Constipation 07/07/2021   Nausea and vomiting 12/06/2019   Marijuana use 02/20/2019   Cyclical vomiting 02/20/2019   Hypokalemia  02/20/2019   Hyperglycemia due to type 1 diabetes mellitus (HCC) 02/19/2019   Type 1 diabetes mellitus without complication (HCC) 02/26/2012   Home Medication(s) Prior to Admission medications   Medication Sig Start Date End Date Taking? Authorizing Provider  ibuprofen (ADVIL) 200 MG tablet Take 200 mg by mouth as needed for moderate pain.   Yes [provider]  insulin aspart (NOVOLOG) 100 UNIT/ML injection Max Daily 60 units per pump Patient taking differently: Inject 10 Units into the skin 3 (three) times daily with meals. Via pump Pump is also injecting per sliding scale per pt 06/08/22  Yes Shamleffer, Konrad Dolores, MD  linaclotide [facility]) 72 MCG capsule Take 1 capsule (72 mcg total) by mouth daily before breakfast. 12/21/22 06/19/23 Yes Celso Amy, PA-C  metoCLOPramide (REGLAN) 5 MG tablet Take 1 tablet (5 mg total) by mouth 3 (three) times daily. 12/21/22 06/19/23 Yes Celso Amy, PA-C  Multiple Vitamins-Minerals (MULTIVITAMIN WITH MINERALS) tablet Take 2 tablets by mouth daily.   Yes [provider]  naproxen sodium (ALEVE) 220 MG tablet Take 440 mg by mouth daily as needed (pain).   Yes [provider]  norethindrone (MICRONOR) 0.35 MG tablet Take 1 tablet by mouth daily. 12/18/22  Yes [provider]  ondansetron (ZOFRAN) 4 MG tablet Take 1 tablet (4 mg total) by mouth every 8 (eight) hours as needed for nausea or vomiting. 12/21/22 06/19/23 Yes Celso Amy, PA-C  Continuous Glucose Sensor (DEXCOM G7 SENSOR) MISC Change every 10 days 11/27/22   Shamleffer, Konrad Dolores, MD  Allergies Patient has no known allergies.  Review of Systems Review of Systems As noted in HPI  Physical Exam Vital Signs  I have reviewed the triage vital signs BP (!) 145/108   Pulse 80   Temp 98.3 F (36.8 C) (Oral)   Resp 15   SpO2  100%   Physical Exam Vitals reviewed.  Constitutional:      General: She is not in acute distress.    Appearance: She is well-developed. She is not diaphoretic.  HENT:     Head: Normocephalic and atraumatic.     Right Ear: External ear normal.     Left Ear: External ear normal.     Nose: Nose normal.  Eyes:     General: No scleral icterus.    Conjunctiva/sclera: Conjunctivae normal.  Neck:     Trachea: Phonation normal.  Cardiovascular:     Rate and Rhythm: Normal rate and regular rhythm.  Pulmonary:     Effort: Pulmonary effort is normal. No respiratory distress.     Breath sounds: No stridor.  Abdominal:     General: There is no distension.     Tenderness: There is generalized abdominal tenderness.  Musculoskeletal:        General: Normal range of motion.     Cervical back: Normal range of motion.  Neurological:     Mental Status: She is alert and oriented to person, place, and time.  Psychiatric:        Behavior: Behavior normal.     ED Results and Treatments Labs (all labs ordered are listed, but only abnormal results are displayed) Labs Reviewed  COMPREHENSIVE METABOLIC PANEL - Abnormal; Notable for the following components:      Result Value   Potassium 3.2 (*)    CO2 20 (*)    Glucose, Bld 164 (*)    All other components within normal limits  CBC WITH DIFFERENTIAL/PLATELET - Abnormal; Notable for the following components:   Platelets 402 (*)    All other components within normal limits  URINALYSIS, ROUTINE W REFLEX MICROSCOPIC - Abnormal; Notable for the following components:   APPearance HAZY (*)    pH 9.0 (*)    Glucose, UA >=500 (*)    Ketones, ur 20 (*)    Protein, ur 30 (*)    All other components within normal limits  BLOOD GAS, VENOUS - Abnormal; Notable for the following components:   pCO2, Ven 38 (*)    pO2, Ven 47 (*)    All other components within normal limits  CBG MONITORING, ED - Abnormal; Notable for the following components:    Glucose-Capillary 232 (*)    All other components within normal limits  LIPASE, BLOOD  HCG, SERUM, QUALITATIVE                                                                                                                         EKG  EKG Interpretation Date/Time:  Tuesday December 26 2022 05:00:08 EDT  Ventricular Rate:  80 PR Interval:  174 QRS Duration:  66 QT Interval:  375 QTC Calculation: 433 R Axis:   78  Text Interpretation: Unknown rhythm, irregular rate ST elevation, consider inferior injury Confirmed by Drema Pry [phone]) on 12/26/2022 6:39:03 AM       Radiology No results found.  Medications Ordered in ED Medications  sodium chloride 0.9 % bolus 1,000 mL (1,000 mLs Intravenous New Bag/Given 12/26/22 0440)    Followed by  sodium chloride 0.9 % bolus 1,000 mL (1,000 mLs Intravenous New Bag/Given 12/26/22 0440)    Followed by  0.9 %  sodium chloride infusion (has no administration in time range)  droperidol (INAPSINE) 2.5 MG/ML injection 2.5 mg (2.5 mg Intravenous Given 12/26/22 0440)  metoCLOPramide (REGLAN) injection 10 mg (10 mg Intravenous Given 12/26/22 0710)  alum & mag hydroxide-simeth (MAALOX/MYLANTA) 200-200-20 MG/5ML suspension 30 mL (30 mLs Oral Given 12/26/22 0711)  ketorolac (TORADOL) 15 MG/ML injection 15 mg (15 mg Intravenous Given 12/26/22 0710)  hyoscyamine (LEVSIN SL) SL tablet 0.25 mg (0.25 mg Oral Given 12/26/22 0710)   Procedures Procedures  (including critical care time) Medical Decision Making / ED Course   Medical Decision Making Amount and/or Complexity of Data Reviewed Labs: ordered. Decision-making details documented in ED Course. ECG/medicine tests: ordered and independent interpretation performed. Decision-making details documented in ED Course.  Risk OTC drugs. Prescription drug management. Drug therapy requiring intensive monitoring for toxicity. Decision regarding hospitalization.    Patient presents with generalized abdominal pain  with nausea vomiting  Differential diagnosis considered: Cannabinoid hyperemesis, DKA, gastroparesis flare.  Will also assess for electrolyte derangements, UTI, Pregnancy related process,  Biliary obstruction or pancreatitis.  Patient provided with IV fluids, IV droperidol. (Prior QTc within the last month was normal.)  CBG 232 CBC without leukocytosis or anemia VBG without acidosis UA without evidence of infection.  Positive ketones. CMP with mild hypokalemia likely from GI losses.  Mild hyperglycemia without DKA.  No renal insufficiency.  No evidence of bili obstruction or pancreatitis. Beta-hCG negative.  Patient reported improvement in her nausea.  Still having abdominal pain.  Given IV Reglan, GI cocktail and IV Toradol.  This provided significant relief.  Able to tolerate p.o.  Ready for discharge.     Final Clinical Impression(s) / ED Diagnoses Final diagnoses:  Nausea and vomiting in adult  Generalized abdominal cramping   The patient appears reasonably screened and/or stabilized for discharge and I doubt any other medical condition or other [facility] requiring further screening, evaluation, or treatment in the ED at this time. I have discussed the findings, Dx and Tx plan with the patient/family who expressed understanding and agree(s) with the plan. Discharge instructions discussed at length. The patient/family was given strict return precautions who verbalized understanding of the instructions. No further questions at time of discharge.  Disposition: Discharge  Condition: Good  ED Discharge Orders     None       Follow Up: Elenore Paddy, NP [address] [place] [address] [phone]  Call  to schedule an appointment for close follow up    This chart was dictated using voice recognition software.  Despite best efforts to proofread,  errors can occur which can change the documentation meaning.    Nira Conn, MD 12/26/22 [phone]

## 2022-12-26 NOTE — Patient Outreach (Signed)
  Care Coordination   12/26/2022 Name: Judy Cook MRN: [id number] DOB: [birth year]   Care Coordination Outreach Attempts:  An unsuccessful telephone outreach was attempted for a scheduled appointment today.  Follow Up Plan:  No further outreach attempts will be made at this time. We have been unable to contact the patient to offer or enroll patient in care coordination services. RN care manager is scheduled to call patient 01/02/23, will notify social work if additional services are needed.  Encounter Outcome:  No Answer   Care Coordination Interventions:  No, not indicated    Sammuel Hines, LCSW [facility] Licensed Clinical Social Work Care Coordinator  Direct Dial: [phone]

## 2022-12-26 NOTE — ED Triage Notes (Addendum)
Pt BIB [facility] with reports of abdominal pain and nausea/vomiting x 1 hr. Pt's sugar dropped to 69, she ate a corn dog and it went to 261. Pt reports not taking her insulin right now. 4 mg zofran I'm given by ems.

## 2022-12-26 NOTE — ED Notes (Signed)
Unsuccessful IV attempt.

## 2022-12-27 ENCOUNTER — Other Ambulatory Visit: Payer: Self-pay

## 2022-12-27 ENCOUNTER — Emergency Department (HOSPITAL_COMMUNITY)
Admission: EM | Admit: 2022-12-27 | Discharge: 2022-12-27 | Disposition: A | Discharge status: Home / Self Care | Payer: Federal, State, Local not specified - PPO | Source: Home / Self Care | Attending: Emergency Medicine | Admitting: Emergency Medicine

## 2022-12-27 ENCOUNTER — Encounter (HOSPITAL_COMMUNITY): Payer: Self-pay | Admitting: Emergency Medicine

## 2022-12-27 DIAGNOSIS — R110 Nausea: Secondary | ICD-10-CM | POA: Diagnosis not present

## 2022-12-27 DIAGNOSIS — E860 Dehydration: Secondary | ICD-10-CM

## 2022-12-27 DIAGNOSIS — E119 Type 2 diabetes mellitus without complications: Secondary | ICD-10-CM | POA: Insufficient documentation

## 2022-12-27 DIAGNOSIS — Z794 Long term (current) use of insulin: Secondary | ICD-10-CM | POA: Insufficient documentation

## 2022-12-27 DIAGNOSIS — R1084 Generalized abdominal pain: Secondary | ICD-10-CM | POA: Insufficient documentation

## 2022-12-27 DIAGNOSIS — R112 Nausea with vomiting, unspecified: Secondary | ICD-10-CM | POA: Insufficient documentation

## 2022-12-27 DIAGNOSIS — Z79899 Other long term (current) drug therapy: Secondary | ICD-10-CM | POA: Insufficient documentation

## 2022-12-27 LAB — URINALYSIS, ROUTINE W REFLEX MICROSCOPIC
Bacteria, UA: NONE SEEN
Bilirubin Urine: NEGATIVE
Glucose, UA: >=500 mg/dL — AB
Hgb urine dipstick: NEGATIVE
Ketones, ur: 80 mg/dL — AB
Leukocytes,Ua: NEGATIVE
Nitrite: NEGATIVE
Protein, ur: 100 mg/dL — AB
Specific Gravity, Urine: 1.024 (ref 1.005–1.030)
pH: 5 (ref 5.0–8.0)

## 2022-12-27 LAB — COMPREHENSIVE METABOLIC PANEL
ALT: 26 U/L (ref 0–44)
AST: 43 U/L — ABNORMAL HIGH (ref 15–41)
Albumin: 5 g/dL (ref 3.5–5.0)
Alkaline Phosphatase: 75 U/L (ref 38–126)
Anion gap: >20 — ABNORMAL HIGH (ref 5–15)
BUN: 13 mg/dL (ref 6–20)
CO2: 20 mmol/L — ABNORMAL LOW (ref 22–32)
Calcium: 9.5 mg/dL (ref 8.9–10.3)
Chloride: 94 mmol/L — ABNORMAL LOW (ref 98–111)
Creatinine, Ser: 1.01 mg/dL — ABNORMAL HIGH (ref 0.44–1.00)
GFR, Estimated: >60 mL/min (ref >60)
Glucose, Bld: 109 mg/dL — ABNORMAL HIGH (ref 70–99)
Potassium: 3.6 mmol/L (ref 3.5–5.1)
Sodium: 135 mmol/L (ref 135–145)
Total Bilirubin: 1.2 mg/dL (ref 0.3–1.2)
Total Protein: 9 g/dL — ABNORMAL HIGH (ref 6.5–8.1)

## 2022-12-27 LAB — CBC
HCT: 43.4 % (ref 36.0–46.0)
Hemoglobin: 14.9 g/dL (ref 12.0–15.0)
MCH: 31 pg (ref 26.0–34.0)
MCHC: 34.3 g/dL (ref 30.0–36.0)
MCV: 90.2 fL (ref 80.0–100.0)
Platelets: 508 10*3/uL — ABNORMAL HIGH (ref 150–400)
RBC: 4.81 MIL/uL (ref 3.87–5.11)
RDW: 13.3 % (ref 11.5–15.5)
WBC: 16.5 10*3/uL — ABNORMAL HIGH (ref 4.0–10.5)
nRBC: 0 % (ref 0.0–0.2)

## 2022-12-27 LAB — BLOOD GAS, VENOUS
Acid-Base Excess: 2.9 mmol/L — ABNORMAL HIGH (ref 0.0–2.0)
Bicarbonate: 25.9 mmol/L (ref 20.0–28.0)
O2 Saturation: 84.8 %
Patient temperature: 37
pCO2, Ven: 34 mmHg — ABNORMAL LOW (ref 44–60)
pH, Ven: 7.49 — ABNORMAL HIGH (ref 7.25–7.43)
pO2, Ven: 47 mmHg — ABNORMAL HIGH (ref 32–45)

## 2022-12-27 LAB — RAPID URINE DRUG SCREEN, HOSP PERFORMED
Amphetamines: NOT DETECTED
Barbiturates: NOT DETECTED
Benzodiazepines: NOT DETECTED
Cocaine: POSITIVE — AB
Opiates: NOT DETECTED
Tetrahydrocannabinol: POSITIVE — AB

## 2022-12-27 LAB — CBG MONITORING, ED: Glucose-Capillary: 382 mg/dL — ABNORMAL HIGH (ref 70–99)

## 2022-12-27 LAB — LIPASE, BLOOD: Lipase: 19 U/L (ref 11–51)

## 2022-12-27 LAB — PREGNANCY, URINE: Preg Test, Ur: NEGATIVE

## 2022-12-27 LAB — ETHANOL: Alcohol, Ethyl (B): <10 mg/dL (ref <10)

## 2022-12-27 MED ORDER — LACTATED RINGERS IV BOLUS
1000.0000 mL | Freq: Once | INTRAVENOUS | Status: AC
  Administered 2022-12-27: 1000 mL via INTRAVENOUS

## 2022-12-27 MED ORDER — HYOSCYAMINE SULFATE 0.125 MG SL SUBL
0.1250 mg | SUBLINGUAL_TABLET | Freq: Once | SUBLINGUAL | Status: AC
  Administered 2022-12-27: 0.125 mg via ORAL

## 2022-12-27 MED ORDER — KETOROLAC TROMETHAMINE 15 MG/ML IJ SOLN
15.0000 mg | Freq: Once | INTRAMUSCULAR | Status: AC
  Administered 2022-12-27: 15 mg via INTRAVENOUS
  Filled 2022-12-27: qty 1

## 2022-12-27 MED ORDER — DROPERIDOL 2.5 MG/ML IJ SOLN
1.2500 mg | Freq: Once | INTRAMUSCULAR | Status: AC
  Administered 2022-12-27: 1.25 mg via INTRAVENOUS
  Filled 2022-12-27: qty 2

## 2022-12-27 MED ORDER — HYOSCYAMINE SULFATE 0.125 MG PO TABS
0.1250 mg | ORAL_TABLET | Freq: Once | ORAL | Status: DC
  Filled 2022-12-27: qty 1

## 2022-12-27 MED ORDER — MORPHINE SULFATE (PF) 4 MG/ML IV SOLN
4.0000 mg | Freq: Once | INTRAVENOUS | Status: AC
  Administered 2022-12-27: 4 mg via INTRAVENOUS
  Filled 2022-12-27: qty 1

## 2022-12-27 MED ORDER — DICYCLOMINE HCL 10 MG/ML IM SOLN
20.0000 mg | Freq: Once | INTRAMUSCULAR | Status: AC
  Administered 2022-12-27: 20 mg via INTRAMUSCULAR
  Filled 2022-12-27: qty 2

## 2022-12-27 NOTE — ED Provider Notes (Signed)
[place] EMERGENCY DEPARTMENT AT [facility] Provider Note   CSN: [id number] Arrival date & time: 12/27/22  4098     History  Chief Complaint  Patient presents with   Abdominal Pain   Emesis    Judy Cook is a 22 y.o. female.  HPI Patient presents 1 day after being seen and evaluated at our affiliated facility with somewhat similar concerns now with nausea, vomiting, diffuse abdominal pain. She notes a history of insulin-dependent diabetes, marijuana use though she has not smoked since discharge yesterday. After feeling transiently better on discharge she notes that her diffuse abdominal pain, nausea, vomiting, and p.o. intolerance has not returned.  She has been unable to take any medication for relief.    Home Medications Prior to Admission medications   Medication Sig Start Date End Date Taking? Authorizing Provider  ibuprofen (ADVIL) 200 MG tablet Take 200 mg by mouth as needed for moderate pain.   Yes [provider]  insulin aspart (NOVOLOG) 100 UNIT/ML injection Max Daily 60 units per pump Patient taking differently: Inject 10 Units into the skin 3 (three) times daily with meals. Via pump Pump is also injecting per sliding scale per pt 06/08/22  Yes Shamleffer, Konrad Dolores, MD  linaclotide [facility]) 72 MCG capsule Take 1 capsule (72 mcg total) by mouth daily before breakfast. 12/21/22 06/19/23 Yes Celso Amy, PA-C  metoCLOPramide (REGLAN) 5 MG tablet Take 1 tablet (5 mg total) by mouth 3 (three) times daily. 12/21/22 06/19/23 Yes Celso Amy, PA-C  Multiple Vitamins-Minerals (MULTIVITAMIN WITH MINERALS) tablet Take 2 tablets by mouth daily.   Yes [provider]  naproxen sodium (ALEVE) 220 MG tablet Take 440 mg by mouth daily as needed (pain).   Yes [provider]  norethindrone (MICRONOR) 0.35 MG tablet Take 0.35 mg by mouth daily. 12/18/22  Yes [provider]  ondansetron (ZOFRAN) 4 MG tablet Take 1 tablet  (4 mg total) by mouth every 8 (eight) hours as needed for nausea or vomiting. 12/21/22 06/19/23 Yes Celso Amy, PA-C  Continuous Glucose Sensor (DEXCOM G7 SENSOR) MISC Change every 10 days 11/27/22   Shamleffer, Konrad Dolores, MD      Allergies    Patient has no known allergies.    Review of Systems   Review of Systems  Unable to perform ROS: Acuity of condition    Physical Exam Updated Vital Signs BP (!) 145/76   Pulse (!) 102   Temp 99.1 F (37.3 C) (Oral)   Resp 16   Ht 5' (1.524 m)   Wt 61 kg   SpO2 99%   BMI 26.26 kg/m  Physical Exam Vitals and nursing note reviewed.  Constitutional:      Appearance: She is well-developed. She is ill-appearing and diaphoretic.  HENT:     Head: Normocephalic and atraumatic.  Eyes:     Conjunctiva/sclera: Conjunctivae normal.  Cardiovascular:     Rate and Rhythm: Normal rate and regular rhythm.  Pulmonary:     Effort: Pulmonary effort is normal. No respiratory distress.     Breath sounds: Normal breath sounds. No stridor.  Abdominal:     General: There is no distension.     Tenderness: There is generalized abdominal tenderness.  Skin:    General: Skin is warm.  Neurological:     Mental Status: She is alert and oriented to person, place, and time.     Cranial Nerves: No cranial nerve deficit.  Psychiatric:        Mood  and Affect: Mood is anxious.     ED Results / Procedures / Treatments   Labs (all labs ordered are listed, but only abnormal results are displayed) Labs Reviewed  COMPREHENSIVE METABOLIC PANEL - Abnormal; Notable for the following components:      Result Value   Chloride 94 (*)    CO2 20 (*)    Glucose, Bld 109 (*)    Creatinine, Ser 1.01 (*)    Total Protein 9.0 (*)    AST 43 (*)    Anion gap >20 (*)    All other components within normal limits  CBC - Abnormal; Notable for the following components:   WBC 16.5 (*)    Platelets 508 (*)    All other components within normal limits  BLOOD GAS, VENOUS -  Abnormal; Notable for the following components:   pH, Ven 7.49 (*)    pCO2, Ven 34 (*)    pO2, Ven 47 (*)    Acid-Base Excess 2.9 (*)    All other components within normal limits  URINALYSIS, ROUTINE W REFLEX MICROSCOPIC - Abnormal; Notable for the following components:   APPearance HAZY (*)    Glucose, UA >=500 (*)    Ketones, ur 80 (*)    Protein, ur 100 (*)    All other components within normal limits  RAPID URINE DRUG SCREEN, HOSP PERFORMED - Abnormal; Notable for the following components:   Cocaine POSITIVE (*)    Tetrahydrocannabinol POSITIVE (*)    All other components within normal limits  CBG MONITORING, ED - Abnormal; Notable for the following components:   Glucose-Capillary 382 (*)    All other components within normal limits  LIPASE, BLOOD  PREGNANCY, URINE  ETHANOL    EKG None  Radiology No results found.  Procedures Procedures    Medications Ordered in ED Medications  lactated ringers bolus 1,000 mL (0 mLs Intravenous Stopped 12/27/22 1053)  droperidol (INAPSINE) 2.5 MG/ML injection 1.25 mg (1.25 mg Intravenous Given 12/27/22 0919)  hyoscyamine (LEVSIN SL) SL tablet 0.125 mg (0.125 mg Oral Given 12/27/22 0926)  lactated ringers bolus 1,000 mL (0 mLs Intravenous Stopped 12/27/22 1308)  droperidol (INAPSINE) 2.5 MG/ML injection 1.25 mg (1.25 mg Intravenous Given 12/27/22 1144)  ketorolac (TORADOL) 15 MG/ML injection 15 mg (15 mg Intravenous Given 12/27/22 1144)  dicyclomine (BENTYL) injection 20 mg (20 mg Intramuscular Given 12/27/22 1144)  morphine (PF) 4 MG/ML injection 4 mg (4 mg Intravenous Given 12/27/22 1313)    ED Course/ Medical Decision Making/ A&P   2:46 PM                              Medical Decision Making Adult female with insulin-dependent diabetes, gastroparesis presents 1 day after prior evaluation with concern for nausea, vomiting, abdominal pain.  Subsection for recurrent gastroparesis, though other considerations include intra-abdominal infection,  electrolyte abnormalities, DKA, nonketotic hyperosmolar state. Cardiac 70 sinus normal Pulse ox 99% room air normal Patient received fluids, droperidol, hyoscyamine after my initial evaluation.  Amount and/or Complexity of Data Reviewed External Data Reviewed: notes.    Details: ED note from yesterday reviewed including medications Labs: ordered. Decision-making details documented in ED Course.  Risk Prescription drug management. Decision regarding hospitalization. Diagnosis or treatment significantly limited by social determinants of health.   2:46 PM Patient calm, findings notable for hyperglycemia, cocaine positive, THC positive drug screen.  She has had no additional vomiting, has been tolerant of ice chips,  has had fluid resuscitation given her ketone urea, evidence for dehydration.  Patient encouraged to follow-up with GI, primary care.        Final Clinical Impression(s) / ED Diagnoses Final diagnoses:  Generalized abdominal pain  Dehydration    Rx / DC Orders ED Discharge Orders     None         Gerhard Munch, MD 12/27/22 1446

## 2022-12-27 NOTE — Discharge Instructions (Addendum)
With your recurrent nausea, vomiting, and abdominal pain and is very important to follow-up with your primary care team.  Return here for concerning changes in your condition.

## 2022-12-27 NOTE — ED Triage Notes (Signed)
Pt to ER via EMS from home with c/o abdominal pain and n/v.  Seen in ED yesterday for same, diagnosed with gastroperesis. Reports unable to keep down anything.  Pt with dry heaves on triage.

## 2022-12-28 ENCOUNTER — Observation Stay (HOSPITAL_COMMUNITY): Payer: Federal, State, Local not specified - PPO

## 2022-12-28 ENCOUNTER — Encounter (HOSPITAL_COMMUNITY): Payer: Self-pay | Admitting: Internal Medicine

## 2022-12-28 ENCOUNTER — Inpatient Hospital Stay (HOSPITAL_COMMUNITY)
Admission: EM | Admit: 2022-12-28 | Discharge: 2022-12-31 | DRG: 074 | Disposition: A | Discharge status: Home / Self Care | Payer: Federal, State, Local not specified - PPO | Attending: Internal Medicine | Admitting: Internal Medicine

## 2022-12-28 ENCOUNTER — Emergency Department (HOSPITAL_COMMUNITY): Payer: Federal, State, Local not specified - PPO

## 2022-12-28 ENCOUNTER — Other Ambulatory Visit: Payer: Self-pay

## 2022-12-28 DIAGNOSIS — Z1152 Encounter for screening for COVID-19: Secondary | ICD-10-CM | POA: Diagnosis not present

## 2022-12-28 DIAGNOSIS — E1143 Type 2 diabetes mellitus with diabetic autonomic (poly)neuropathy: Secondary | ICD-10-CM | POA: Diagnosis present

## 2022-12-28 DIAGNOSIS — E860 Dehydration: Secondary | ICD-10-CM | POA: Diagnosis not present

## 2022-12-28 DIAGNOSIS — E876 Hypokalemia: Secondary | ICD-10-CM | POA: Diagnosis present

## 2022-12-28 DIAGNOSIS — R1084 Generalized abdominal pain: Secondary | ICD-10-CM | POA: Diagnosis not present

## 2022-12-28 DIAGNOSIS — Z8249 Family history of ischemic heart disease and other diseases of the circulatory system: Secondary | ICD-10-CM

## 2022-12-28 DIAGNOSIS — K2100 Gastro-esophageal reflux disease with esophagitis, without bleeding: Secondary | ICD-10-CM | POA: Diagnosis present

## 2022-12-28 DIAGNOSIS — Z794 Long term (current) use of insulin: Secondary | ICD-10-CM | POA: Diagnosis not present

## 2022-12-28 DIAGNOSIS — R051 Acute cough: Secondary | ICD-10-CM

## 2022-12-28 DIAGNOSIS — D72829 Elevated white blood cell count, unspecified: Secondary | ICD-10-CM | POA: Diagnosis not present

## 2022-12-28 DIAGNOSIS — N179 Acute kidney failure, unspecified: Secondary | ICD-10-CM | POA: Diagnosis not present

## 2022-12-28 DIAGNOSIS — R109 Unspecified abdominal pain: Secondary | ICD-10-CM | POA: Diagnosis not present

## 2022-12-28 DIAGNOSIS — Z83438 Family history of other disorder of lipoprotein metabolism and other lipidemia: Secondary | ICD-10-CM

## 2022-12-28 DIAGNOSIS — Z8616 Personal history of COVID-19: Secondary | ICD-10-CM | POA: Diagnosis not present

## 2022-12-28 DIAGNOSIS — E872 Acidosis, unspecified: Secondary | ICD-10-CM | POA: Diagnosis present

## 2022-12-28 DIAGNOSIS — K3184 Gastroparesis: Secondary | ICD-10-CM | POA: Diagnosis not present

## 2022-12-28 DIAGNOSIS — D75839 Thrombocytosis, unspecified: Secondary | ICD-10-CM | POA: Diagnosis not present

## 2022-12-28 DIAGNOSIS — F17210 Nicotine dependence, cigarettes, uncomplicated: Secondary | ICD-10-CM | POA: Diagnosis present

## 2022-12-28 DIAGNOSIS — E1065 Type 1 diabetes mellitus with hyperglycemia: Secondary | ICD-10-CM

## 2022-12-28 DIAGNOSIS — E1043 Type 1 diabetes mellitus with diabetic autonomic (poly)neuropathy: Principal | ICD-10-CM | POA: Diagnosis present

## 2022-12-28 DIAGNOSIS — Z818 Family history of other mental and behavioral disorders: Secondary | ICD-10-CM

## 2022-12-28 DIAGNOSIS — Z9641 Presence of insulin pump (external) (internal): Secondary | ICD-10-CM | POA: Diagnosis not present

## 2022-12-28 DIAGNOSIS — Z79899 Other long term (current) drug therapy: Secondary | ICD-10-CM

## 2022-12-28 DIAGNOSIS — E109 Type 1 diabetes mellitus without complications: Secondary | ICD-10-CM | POA: Diagnosis present

## 2022-12-28 DIAGNOSIS — Z833 Family history of diabetes mellitus: Secondary | ICD-10-CM

## 2022-12-28 DIAGNOSIS — R112 Nausea with vomiting, unspecified: Secondary | ICD-10-CM | POA: Diagnosis not present

## 2022-12-28 DIAGNOSIS — Z813 Family history of other psychoactive substance abuse and dependence: Secondary | ICD-10-CM | POA: Diagnosis not present

## 2022-12-28 DIAGNOSIS — F1290 Cannabis use, unspecified, uncomplicated: Secondary | ICD-10-CM | POA: Diagnosis present

## 2022-12-28 DIAGNOSIS — R059 Cough, unspecified: Secondary | ICD-10-CM | POA: Diagnosis present

## 2022-12-28 LAB — GLUCOSE, CAPILLARY
Glucose-Capillary: 195 mg/dL — ABNORMAL HIGH (ref 70–99)
Glucose-Capillary: 203 mg/dL — ABNORMAL HIGH (ref 70–99)
Glucose-Capillary: 243 mg/dL — ABNORMAL HIGH (ref 70–99)
Glucose-Capillary: 359 mg/dL — ABNORMAL HIGH (ref 70–99)

## 2022-12-28 LAB — COMPREHENSIVE METABOLIC PANEL
ALT: 28 U/L (ref 0–44)
AST: 40 U/L (ref 15–41)
Albumin: 4.5 g/dL (ref 3.5–5.0)
Alkaline Phosphatase: 74 U/L (ref 38–126)
Anion gap: 18 — ABNORMAL HIGH (ref 5–15)
BUN: 13 mg/dL (ref 6–20)
CO2: 22 mmol/L (ref 22–32)
Calcium: 9.9 mg/dL (ref 8.9–10.3)
Chloride: 95 mmol/L — ABNORMAL LOW (ref 98–111)
Creatinine, Ser: 1.08 mg/dL — ABNORMAL HIGH (ref 0.44–1.00)
GFR, Estimated: >60 mL/min (ref >60)
Glucose, Bld: 124 mg/dL — ABNORMAL HIGH (ref 70–99)
Potassium: 3.2 mmol/L — ABNORMAL LOW (ref 3.5–5.1)
Sodium: 135 mmol/L (ref 135–145)
Total Bilirubin: 0.8 mg/dL (ref 0.3–1.2)
Total Protein: 7.9 g/dL (ref 6.5–8.1)

## 2022-12-28 LAB — RESP PANEL BY RT-PCR (RSV, FLU A&B, COVID)  RVPGX2
Influenza A by PCR: NEGATIVE
Influenza B by PCR: NEGATIVE
Resp Syncytial Virus by PCR: NEGATIVE
SARS Coronavirus 2 by RT PCR: NEGATIVE

## 2022-12-28 LAB — CBG MONITORING, ED: Glucose-Capillary: 57 mg/dL — ABNORMAL LOW (ref 70–99)

## 2022-12-28 LAB — CBC
HCT: 41.1 % (ref 36.0–46.0)
Hemoglobin: 14.6 g/dL (ref 12.0–15.0)
MCH: 30.8 pg (ref 26.0–34.0)
MCHC: 35.5 g/dL (ref 30.0–36.0)
MCV: 86.7 fL (ref 80.0–100.0)
Platelets: 476 10*3/uL — ABNORMAL HIGH (ref 150–400)
RBC: 4.74 MIL/uL (ref 3.87–5.11)
RDW: 13.1 % (ref 11.5–15.5)
WBC: 20 10*3/uL — ABNORMAL HIGH (ref 4.0–10.5)
nRBC: 0 % (ref 0.0–0.2)

## 2022-12-28 LAB — PROCALCITONIN: Procalcitonin: 1.02 ng/mL

## 2022-12-28 LAB — LIPASE, BLOOD: Lipase: 20 U/L (ref 11–51)

## 2022-12-28 MED ORDER — IOHEXOL 350 MG/ML SOLN
75.0000 mL | Freq: Once | INTRAVENOUS | Status: AC | PRN
  Administered 2022-12-28: 75 mL via INTRAVENOUS

## 2022-12-28 MED ORDER — SODIUM CHLORIDE 0.9 % IV BOLUS
1000.0000 mL | Freq: Once | INTRAVENOUS | Status: AC
  Administered 2022-12-28: 1000 mL via INTRAVENOUS

## 2022-12-28 MED ORDER — DICYCLOMINE HCL 10 MG PO CAPS
10.0000 mg | ORAL_CAPSULE | Freq: Once | ORAL | Status: DC
  Filled 2022-12-28: qty 1

## 2022-12-28 MED ORDER — PANTOPRAZOLE SODIUM 40 MG IV SOLR
40.0000 mg | Freq: Two times a day (BID) | INTRAVENOUS | Status: DC
  Administered 2022-12-28 – 2022-12-31 (×7): 40 mg via INTRAVENOUS
  Filled 2022-12-28 (×7): qty 10

## 2022-12-28 MED ORDER — PROCHLORPERAZINE EDISYLATE 10 MG/2ML IJ SOLN
10.0000 mg | Freq: Once | INTRAMUSCULAR | Status: AC
  Administered 2022-12-28: 10 mg via INTRAVENOUS
  Filled 2022-12-28: qty 2

## 2022-12-28 MED ORDER — HALOPERIDOL LACTATE 5 MG/ML IJ SOLN
2.0000 mg | Freq: Once | INTRAMUSCULAR | Status: AC
  Administered 2022-12-28: 2 mg via INTRAVENOUS
  Filled 2022-12-28: qty 1

## 2022-12-28 MED ORDER — INSULIN GLARGINE-YFGN 100 UNIT/ML ~~LOC~~ SOLN
8.0000 [IU] | Freq: Every day | SUBCUTANEOUS | Status: DC
  Administered 2022-12-28: 8 [IU] via SUBCUTANEOUS
  Filled 2022-12-28 (×2): qty 0.08

## 2022-12-28 MED ORDER — ACETAMINOPHEN 325 MG PO TABS
650.0000 mg | ORAL_TABLET | Freq: Four times a day (QID) | ORAL | Status: DC | PRN
  Administered 2022-12-28 – 2022-12-30 (×2): 650 mg via ORAL
  Filled 2022-12-28 (×2): qty 2

## 2022-12-28 MED ORDER — ALBUTEROL SULFATE (2.5 MG/3ML) 0.083% IN NEBU: 2.5000 mg | INHALATION_SOLUTION | Freq: Four times a day (QID) | RESPIRATORY_TRACT | Status: DC | PRN

## 2022-12-28 MED ORDER — DIPHENHYDRAMINE HCL 50 MG/ML IJ SOLN
25.0000 mg | Freq: Once | INTRAMUSCULAR | Status: AC
  Administered 2022-12-28: 25 mg via INTRAVENOUS
  Filled 2022-12-28: qty 1

## 2022-12-28 MED ORDER — INSULIN ASPART 100 UNIT/ML IJ SOLN
0.0000 [IU] | INTRAMUSCULAR | Status: DC
  Administered 2022-12-28: 2 [IU] via SUBCUTANEOUS
  Administered 2022-12-28: 1 [IU] via SUBCUTANEOUS
  Administered 2022-12-29 (×2): 2 [IU] via SUBCUTANEOUS

## 2022-12-28 MED ORDER — PROCHLORPERAZINE EDISYLATE 10 MG/2ML IJ SOLN
10.0000 mg | Freq: Four times a day (QID) | INTRAMUSCULAR | Status: DC | PRN
  Administered 2022-12-28 – 2022-12-30 (×5): 10 mg via INTRAVENOUS
  Filled 2022-12-28 (×5): qty 2

## 2022-12-28 MED ORDER — SUCRALFATE 1 G PO TABS
1.0000 g | ORAL_TABLET | Freq: Three times a day (TID) | ORAL | Status: DC
  Administered 2022-12-28 – 2022-12-31 (×6): 1 g via ORAL
  Filled 2022-12-28 (×8): qty 1

## 2022-12-28 MED ORDER — PHENOL 1.4 % MT LIQD
1.0000 | OROMUCOSAL | Status: DC | PRN
  Administered 2022-12-28: 1 via OROMUCOSAL
  Filled 2022-12-28: qty 177

## 2022-12-28 MED ORDER — ONDANSETRON HCL 4 MG/2ML IJ SOLN
4.0000 mg | Freq: Four times a day (QID) | INTRAMUSCULAR | Status: DC | PRN
  Administered 2022-12-28 – 2022-12-30 (×7): 4 mg via INTRAVENOUS
  Filled 2022-12-28 (×8): qty 2

## 2022-12-28 MED ORDER — MORPHINE SULFATE (PF) 2 MG/ML IV SOLN
2.0000 mg | INTRAVENOUS | Status: AC | PRN
  Administered 2022-12-28 – 2022-12-29 (×3): 2 mg via INTRAVENOUS
  Filled 2022-12-28 (×3): qty 1

## 2022-12-28 MED ORDER — KETOROLAC TROMETHAMINE 15 MG/ML IJ SOLN
15.0000 mg | Freq: Once | INTRAMUSCULAR | Status: AC
  Administered 2022-12-28: 15 mg via INTRAVENOUS
  Filled 2022-12-28: qty 1

## 2022-12-28 MED ORDER — POTASSIUM CHLORIDE IN NACL 40-0.9 MEQ/L-% IV SOLN
INTRAVENOUS | Status: AC
  Filled 2022-12-28 (×2): qty 1000

## 2022-12-28 MED ORDER — INSULIN GLARGINE-YFGN 100 UNIT/ML ~~LOC~~ SOLN: 8.0000 [IU] | Freq: Every day | SUBCUTANEOUS | Status: DC

## 2022-12-28 MED ORDER — SODIUM CHLORIDE 0.9% FLUSH
3.0000 mL | Freq: Two times a day (BID) | INTRAVENOUS | Status: DC
  Administered 2022-12-28 – 2022-12-31 (×6): 3 mL via INTRAVENOUS

## 2022-12-28 MED ORDER — ONDANSETRON HCL 4 MG/2ML IJ SOLN
4.0000 mg | Freq: Once | INTRAMUSCULAR | Status: AC
  Administered 2022-12-28: 4 mg via INTRAVENOUS
  Filled 2022-12-28: qty 2

## 2022-12-28 MED ORDER — METOCLOPRAMIDE HCL 5 MG/ML IJ SOLN
10.0000 mg | Freq: Three times a day (TID) | INTRAMUSCULAR | Status: DC
  Administered 2022-12-28 – 2022-12-31 (×9): 10 mg via INTRAVENOUS
  Filled 2022-12-28 (×10): qty 2

## 2022-12-28 MED ORDER — ENOXAPARIN SODIUM 40 MG/0.4ML IJ SOSY
40.0000 mg | PREFILLED_SYRINGE | INTRAMUSCULAR | Status: DC
  Administered 2022-12-28 – 2022-12-30 (×3): 40 mg via SUBCUTANEOUS
  Filled 2022-12-28 (×3): qty 0.4

## 2022-12-28 MED ORDER — INSULIN ASPART 100 UNIT/ML IJ SOLN
0.0000 [IU] | INTRAMUSCULAR | Status: DC
  Administered 2022-12-28: 9 [IU] via SUBCUTANEOUS

## 2022-12-28 MED ORDER — ACETAMINOPHEN 650 MG RE SUPP: 650.0000 mg | Freq: Four times a day (QID) | RECTAL | Status: DC | PRN

## 2022-12-28 MED ORDER — ONDANSETRON HCL 4 MG PO TABS
4.0000 mg | ORAL_TABLET | Freq: Four times a day (QID) | ORAL | Status: DC | PRN
  Administered 2022-12-30: 4 mg via ORAL
  Filled 2022-12-28: qty 1

## 2022-12-28 MED ORDER — MORPHINE SULFATE (PF) 4 MG/ML IV SOLN
4.0000 mg | Freq: Once | INTRAVENOUS | Status: AC
  Administered 2022-12-28: 4 mg via INTRAVENOUS
  Filled 2022-12-28: qty 1

## 2022-12-28 MED ORDER — INSULIN GLARGINE-YFGN 100 UNIT/ML ~~LOC~~ SOLN
12.0000 [IU] | Freq: Every day | SUBCUTANEOUS | Status: DC
  Filled 2022-12-28: qty 0.12

## 2022-12-28 NOTE — Inpatient Diabetes Management (Addendum)
Inpatient Diabetes Program Recommendations  AACE/ADA: New Consensus Statement on Inpatient Glycemic Control (2015)  Target Ranges:  Prepandial:   less than 140 mg/dL      Peak postprandial:   less than 180 mg/dL (1-2 hours)      Critically ill patients:  140 - 180 mg/dL   Lab Results  Component Value Date   GLUCAP 57 (L) 12/28/2022   HGBA1C 7.6 (H) 09/05/2022    Review of Glycemic Control: Current blood sugar is 353 mg/dL.  (Insulin pump removed in ED)  Diabetes history: Type 1 DM- See's Dr. Owens Loffler Tandem insulin pump Pump   Tandem  Settings   Insulin type   Novolog     Basal rate          0000 0.6 u/h (total basal 14.4 units)                       I:C ratio          0000 1:1      #4  Grams with a meal,# 2 g with a snack                       Sensitivity          0000  45         Goal          0000  120          Inpatient Diabetes Program Recommendations:    Dr. Katrinka Blazing spoke to patient and she does not want to resume insulin pump at this time.  Her pump is at home so I was unable to review total basal in auto mode.  Patient was in hospital in July of 2024 and was taking Semglee 6 units daily.    Consider slight reduction in Semglee to 8 units daily and reduce Novolog to very sensitive 0-6 units q 4 hours.  Will follow.    Thanks,  Beryl Meager, RN, BC-ADM Inpatient Diabetes Coordinator Pager [phone]  (8a-5p)

## 2022-12-28 NOTE — ED Triage Notes (Signed)
Patient BIB [facility] for abdominal pain and n/v. Seen in ED at [facility] earlier tonight for same and ultimately discharged. Hx of gastroperesis and diabetes. CBG in the 200s with EMS. All other VSS. Returned home and still not feeling well so requested further evaluation at [facility].

## 2022-12-28 NOTE — Discharge Instructions (Addendum)
You were evaluated in the Emergency Department and after careful evaluation, we did not find any emergent condition requiring admission or further testing in the hospital.  Your exam/testing today is overall reassuring.  Please return to the Emergency Department if you experience any worsening of your condition.   Thank you for allowing us to be a part of your care. 

## 2022-12-28 NOTE — ED Provider Notes (Signed)
[facility] Emergency Department Provider Note MRN:  [id number]  Arrival date & time: 12/28/22     Chief Complaint   Abdominal Pain   History of Present Illness   Judy Cook is a 22 y.o. year-old female with a history of type 1 diabetes, gastroparesis presenting to the ED with chief complaint of abdominal pain.  Diffuse abdominal pain with nausea vomiting.  Persistent over the past few days.  Multiple ED visits recently.  Denies fever.  Review of Systems  A thorough review of systems was obtained and all systems are negative except as noted in the HPI and PMH.   Patient's Health History    Past Medical History:  Diagnosis Date   Allergic rhinitis 07/07/2021   COVID 2020   mild   Diabetes mellitus (HCC)    Type 1, type 5   Dysmenorrhea 02/20/2019   GERD (gastroesophageal reflux disease)    Headache    History of herpes genitalis 06/24/2021   Intractable vomiting    SIRS (systemic inflammatory response syndrome) (HCC) 12/03/2019    Past Surgical History:  Procedure Laterality Date   DILATION AND EVACUATION N/A 07/05/2022   Procedure: DILATATION AND EVACUATION;  Surgeon: Winchester Bing, MD;  Location: [facility] OR;  Service: Gynecology;  Laterality: N/A;   OPERATIVE ULTRASOUND N/A 07/05/2022   Procedure: OPERATIVE ULTRASOUND;  Surgeon: Hungerford Bing, MD;  Location: [facility] OR;  Service: Gynecology;  Laterality: N/A;    Family History  Problem Relation Age of Onset   Diabetes Paternal Grandfather    Heart attack Paternal Grandfather    Diabetes Paternal Grandmother    Cancer Maternal Grandmother        lung   Alcohol abuse Maternal Grandfather    Alcohol abuse Father    Drug abuse Father    High Cholesterol Father    Alcohol abuse Mother    Depression Mother    Drug abuse Mother     Social History   Socioeconomic History   Marital status: Single    Spouse name: Not on file   Number of children: Not on file   Years of education: Not  on file   Highest education level: Some college, no degree  Occupational History   Not on file  Tobacco Use   Smoking status: Some Days    Types: Cigarettes   Smokeless tobacco: Never  Vaping Use   Vaping status: Former   Substances: Nicotine, THC  Substance and Sexual Activity   Alcohol use: Yes    Comment: socially   Drug use: Yes    Types: Marijuana   Sexual activity: Yes    Partners: Male    Birth control/protection: Pill  Other Topics Concern   Not on file  Social History Narrative   Not on file   Social Determinants of Health   Financial Resource Strain: Medium Risk (10/19/2022)   Overall Financial Resource Strain (CARDIA)    Difficulty of Paying Living Expenses: Somewhat hard  Food Insecurity: Food Insecurity Present (11/07/2022)   Hunger Vital Sign    Worried About Running Out of Food in the Last Year: Sometimes true    Ran Out of Food in the Last Year: Sometimes true  Transportation Needs: No Transportation Needs (11/07/2022)   PRAPARE - Administrator, Civil Service (Medical): No    Lack of Transportation (Non-Medical): No  Physical Activity: Insufficiently Active (09/20/2022)   Exercise Vital Sign    Days of Exercise per Week: 2 days  Minutes of Exercise per Session: 20 min  Stress: No Stress Concern Present (10/28/2022)   Received from [facility], [facility] - Occupational Stress Questionnaire    Feeling of Stress : Only a little  Recent Concern: Stress - Stress Concern Present (10/19/2022)   [employer] of Occupational Health - Occupational Stress Questionnaire    Feeling of Stress : To some extent  Social Connections: Unknown (10/30/2022)   Received from [facility], [facility]   Social Network    Social Network: Not on file  Intimate Partner Violence: Not At Risk (11/07/2022)   Humiliation, Afraid, Rape, and Kick questionnaire    Fear of Current or Ex-Partner: No    Emotionally Abused:  No    Physically Abused: No    Sexually Abused: No     Physical Exam   Vitals:   12/28/22 0400 12/28/22 0626  BP: (!) 159/96 119/75  Pulse: 83 82  Resp: (!) 21 19  Temp:  97.7 F (36.5 C)  SpO2: 100% 100%    CONSTITUTIONAL: Chronically ill-appearing, moderate distress due to discomfort NEURO/PSYCH:  Alert and oriented x 3, no focal deficits EYES:  eyes equal and reactive ENT/NECK:  no LAD, no JVD CARDIO: Tachycardic rate, well-perfused, normal S1 and S2 PULM:  CTAB no wheezing or rhonchi GI/GU:  non-distended, non-tender MSK/SPINE:  No gross deformities, no edema SKIN:  no rash, atraumatic   *Additional and/or pertinent findings included in MDM below  Diagnostic and Interventional Summary    EKG Interpretation Date/Time:    Ventricular Rate:    PR Interval:    QRS Duration:    QT Interval:    QTC Calculation:   R Axis:      Text Interpretation:         Labs Reviewed  CBC - Abnormal; Notable for the following components:      Result Value   WBC 20.0 (*)    Platelets 476 (*)    All other components within normal limits  COMPREHENSIVE METABOLIC PANEL - Abnormal; Notable for the following components:   Potassium 3.2 (*)    Chloride 95 (*)    Glucose, Bld 124 (*)    Creatinine, Ser 1.08 (*)    Anion gap 18 (*)    All other components within normal limits  CBG MONITORING, ED - Abnormal; Notable for the following components:   Glucose-Capillary 57 (*)    All other components within normal limits  LIPASE, BLOOD    CT ABDOMEN PELVIS W CONTRAST  Final Result      Medications  sodium chloride 0.9 % bolus 1,000 mL (0 mLs Intravenous Stopped 12/28/22 0642)  sodium chloride 0.9 % bolus 1,000 mL (0 mLs Intravenous Stopped 12/28/22 0419)  haloperidol lactate (HALDOL) injection 2 mg (2 mg Intravenous Given 12/28/22 0306)  morphine (PF) 4 MG/ML injection 4 mg (4 mg Intravenous Given 12/28/22 0308)  iohexol (OMNIPAQUE) 350 MG/ML injection 75 mL (75 mLs Intravenous  Contrast Given 12/28/22 0330)     Procedures  /  Critical Care Procedures  ED Course and Medical Decision Making  Initial Impression and Ddx Suspect gastroparesis, also considering DKA, electrolyte disturbance, intra-abdominal infection or perforation or obstruction.  Past medical/surgical history that increases complexity of ED encounter: Diabetes, gastroparesis  Interpretation of Diagnostics I personally reviewed the laboratory assessment and my interpretation is as follows: No significant blood count or electrolyte disturbance  CT imaging is without acute pathology  Patient Reassessment and Ultimate  Disposition/Management     Patient feeling a lot better, had a blood sugar of 55 and she was able to eat and drink without issue.  Appropriate for discharge.  Patient management required discussion with the following services or consulting groups:  None  Complexity of Problems Addressed Acute illness or injury that poses threat of life of bodily function  Additional Data Reviewed and Analyzed Further history obtained from: None  Additional Factors Impacting ED Encounter Risk None  Elmer Sow. Pilar Plate, MD [facility] Emergency Medicine [facility] [email or web address]  Final Clinical Impressions(s) / ED Diagnoses     ICD-10-CM   1. Generalized abdominal pain  R10.84       ED Discharge Orders     None        Discharge Instructions Discussed with and Provided to Patient:     Discharge Instructions      You were evaluated in the Emergency Department and after careful evaluation, we did not find any emergent condition requiring admission or further testing in the hospital.  Your exam/testing today is overall reassuring.  Please return to the Emergency Department if you experience any worsening of your condition.   Thank you for allowing [place] to be a part of your care.       Sabas Sous, MD 12/28/22 [phone]

## 2022-12-28 NOTE — ED Notes (Signed)
Visually saw patient sticking fingers down throat to induce vomiting

## 2022-12-28 NOTE — ED Notes (Signed)
ED TO INPATIENT HANDOFF REPORT  ED Nurse Name and Phone #: [phone], Tori   S Name/Age/Gender Judy Cook 22 y.o. female Room/Bed: 033C/033C  Code Status   Code Status: Prior  Home/SNF/Other Home Patient oriented to: self, place, time, and situation Is this baseline? Yes   Triage Complete: Triage complete  Chief Complaint Intractable nausea and vomiting [R11.2]  Triage Note Patient BIB [facility] for abdominal pain and n/v. Seen in ED at [facility] earlier tonight for same and ultimately discharged. Hx of gastroperesis and diabetes. CBG in the 200s with EMS. All other VSS. Returned home and still not feeling well so requested further evaluation at [facility].    Allergies No Known Allergies  Level of Care/Admitting Diagnosis ED Disposition     ED Disposition  Admit   Condition  --   Comment  Hospital Area: [facility] [[id number]]  Level of Care: Med-Surg [16]  May place patient in observation at [facility] or [facility] if equivalent level of care is available:: No  Covid Evaluation: Confirmed COVID Negative  Diagnosis: Intractable nausea and vomiting [720114]  Admitting Physician: Clydie Braun [[id number]]  Attending Physician: Clydie Braun [[id number]]          B Medical/Surgery History Past Medical History:  Diagnosis Date   Allergic rhinitis 07/07/2021   COVID 2020   mild   Diabetes mellitus (HCC)    Type 1, type 5   Dysmenorrhea 02/20/2019   GERD (gastroesophageal reflux disease)    Headache    History of herpes genitalis 06/24/2021   Intractable vomiting    SIRS (systemic inflammatory response syndrome) (HCC) 12/03/2019   Past Surgical History:  Procedure Laterality Date   DILATION AND EVACUATION N/A 07/05/2022   Procedure: DILATATION AND EVACUATION;  Surgeon: [place] Bing, MD;  Location[facility] OR;  Service: Gynecology;  Laterality: N/A;   OPERATIVE ULTRASOUND N/A 07/05/2022   Procedure: OPERATIVE ULTRASOUND;  Surgeon: Sandoval Bing, MD;  Location: [facility] OR;  Service: Gynecology;  Laterality: N/A;     A IV Location/Drains/Wounds Patient Lines/Drains/Airways Status     Active Line/Drains/Airways     Name Placement date Placement time Site Days   Peripheral IV 12/28/22 20 G Right Antecubital 12/28/22  0256  Antecubital  less than 1            Intake/Output Last 24 hours  Intake/Output Summary (Last 24 hours) at 12/28/2022 0818 Last data filed at 12/28/2022 1324 Gross per 24 hour  Intake 1998 ml  Output --  Net 1998 ml    Labs/Imaging Results for orders placed or performed during the hospital encounter of 12/28/22 (from the past 48 hour(s))  CBC     Status: Abnormal   Collection Time: 12/28/22  2:58 AM  Result Value Ref Range   WBC 20.0 (H) 4.0 - 10.5 K/uL   RBC 4.74 3.87 - 5.11 MIL/uL   Hemoglobin 14.6 12.0 - 15.0 g/dL   HCT 40.1 02.7 - 25.3 %   MCV 86.7 80.0 - 100.0 fL   MCH 30.8 26.0 - 34.0 pg   MCHC 35.5 30.0 - 36.0 g/dL   RDW 66.4 40.3 - 47.4 %   Platelets 476 (H) 150 - 400 K/uL   nRBC 0.0 0.0 - 0.2 %    Comment: Performed at [facility], [address] [place], [place] [address]  Comprehensive metabolic panel     Status: Abnormal   Collection Time: 12/28/22  2:58 AM  Result Value Ref Range  Sodium 135 135 - 145 mmol/L   Potassium 3.2 (L) 3.5 - 5.1 mmol/L   Chloride 95 (L) 98 - 111 mmol/L   CO2 22 22 - 32 mmol/L   Glucose, Bld 124 (H) 70 - 99 mg/dL    Comment: Glucose reference range applies only to samples taken after fasting for at least 8 hours.   BUN 13 6 - 20 mg/dL   Creatinine, Ser 1.61 (H) 0.44 - 1.00 mg/dL   Calcium 9.9 8.9 - 09.6 mg/dL   Total Protein 7.9 6.5 - 8.1 g/dL   Albumin 4.5 3.5 - 5.0 g/dL   AST 40 15 - 41 U/L   ALT 28 0 - 44 U/L   Alkaline Phosphatase 74 38 - 126 U/L   Total Bilirubin 0.8 0.3 - 1.2 mg/dL   GFR, Estimated >04 >54 mL/min    Comment: (NOTE) Calculated using the CKD-EPI Creatinine Equation (2021)    Anion gap 18 (H) 5 - 15    Comment:  ELECTROLYTES REPEATED TO VERIFY Performed at [facility], [address], [place], [place] [address]   Lipase, blood     Status: None   Collection Time: 12/28/22  2:58 AM  Result Value Ref Range   Lipase 20 11 - 51 U/L    Comment: Performed at [facility], [address], [place], [place] [address]  CBG monitoring, ED     Status: Abnormal   Collection Time: 12/28/22  6:31 AM  Result Value Ref Range   Glucose-Capillary 57 (L) 70 - 99 mg/dL    Comment: Glucose reference range applies only to samples taken after fasting for at least 8 hours.   CT ABDOMEN PELVIS W CONTRAST  Result Date: 12/28/2022 CLINICAL DATA:  Abdominal pain, acute, nonlocalized Hx of gastroperesis and diabetes. CBG in the 200s with EMS. All other VSS EXAM: CT ABDOMEN AND PELVIS WITH CONTRAST TECHNIQUE: Multidetector CT imaging of the abdomen and pelvis was performed using the standard protocol following bolus administration of intravenous contrast. RADIATION DOSE REDUCTION: This exam was performed according to the departmental dose-optimization program which includes automated exposure control, adjustment of the mA and/or kV according to patient size and/or use of iterative reconstruction technique. CONTRAST:  75mL OMNIPAQUE IOHEXOL 350 MG/ML SOLN COMPARISON:  CT abdomen pelvis 05/16/2022, CT abdomen pelvis 09/04/2022. FINDINGS: Lower chest: Partially visualized distal esophagus demonstrates marked esophageal wall thickening. Old healed left rib fractures. Hepatobiliary: No focal liver abnormality. No gallstones, gallbladder wall thickening, or pericholecystic fluid. No biliary dilatation. Pancreas: No focal lesion. Normal pancreatic contour. No surrounding inflammatory changes. No main pancreatic ductal dilatation. Spleen: Normal in size without focal abnormality. Adrenals/Urinary Tract: No adrenal nodule bilaterally. Bilateral kidneys enhance symmetrically. No hydronephrosis. No hydroureter. The urinary bladder is  unremarkable. Stomach/Bowel: Stomach is within normal limits. No evidence of bowel wall thickening or dilatation. Appendix appears normal. Vascular/Lymphatic: No abdominal aorta or iliac aneurysm. Mild atherosclerotic plaque of the aorta and its branches. No abdominal, pelvic, or inguinal lymphadenopathy. Reproductive: Uterus and bilateral adnexa are unremarkable. Other: No intraperitoneal free fluid. No intraperitoneal free gas. No organized fluid collection. Musculoskeletal: No abdominal wall hernia or abnormality. No suspicious lytic or blastic osseous lesions. No acute displaced fracture. IMPRESSION: 1. Partially visualized distal esophagus demonstrates marked esophageal wall thickening. Correlate with signs and symptoms of esophagitis. Consider direct visualization. 2. No acute intra-abdominal or intrapelvic abnormality. Electronically Signed   By: Tish Frederickson M.D.   On: 12/28/2022 03:45    Pending Labs  Unresulted Labs (From admission, onward)    None       Vitals/Pain Today's Vitals   12/28/22 0416 12/28/22 0626 12/28/22 0642 12/28/22 0744  BP:  119/75    Pulse:  82  86  Resp:  19  19  Temp:  97.7 F (36.5 C)    TempSrc:      SpO2:  100%  100%  Weight:      Height:      PainSc: Asleep  6      Isolation Precautions No active isolations  Medications Medications  dicyclomine (BENTYL) capsule 10 mg (10 mg Oral Not Given 12/28/22 0731)  sodium chloride 0.9 % bolus 1,000 mL (0 mLs Intravenous Stopped 12/28/22 0642)  sodium chloride 0.9 % bolus 1,000 mL (0 mLs Intravenous Stopped 12/28/22 0419)  haloperidol lactate (HALDOL) injection 2 mg (2 mg Intravenous Given 12/28/22 0306)  morphine (PF) 4 MG/ML injection 4 mg (4 mg Intravenous Given 12/28/22 0308)  iohexol (OMNIPAQUE) 350 MG/ML injection 75 mL (75 mLs Intravenous Contrast Given 12/28/22 0330)  ondansetron (ZOFRAN) injection 4 mg (4 mg Intravenous Given 12/28/22 0731)  diphenhydrAMINE (BENADRYL) injection 25 mg (25 mg Intravenous Given  12/28/22 0814)  prochlorperazine (COMPAZINE) injection 10 mg (10 mg Intravenous Given 12/28/22 0816)  ketorolac (TORADOL) 15 MG/ML injection 15 mg (15 mg Intravenous Given 12/28/22 0815)    Mobility walks     Focused Assessments    R Recommendations: See Admitting Provider Note  Report given to:   Additional Notes: axox4

## 2022-12-28 NOTE — H&P (Addendum)
History and Physical    Patient: Judy Cook AVW:[id number] DOB: [birth year] DOA: 12/28/2022 DOS: the patient was seen and examined on 12/28/2022 PCP: Elenore Paddy, NP  Patient coming from: Home  Chief Complaint:  Chief Complaint  Patient presents with   Abdominal Pain   HPI: Judy Cook is a 22 y.o. female with medical history significant of diabetes mellitus type 1 with gastroparesis who presents with abdominal pain with nausea and vomiting.  Patient reports symptoms started 2 days ago with lower abdominal pain.  She ports that she had eaten breakfast prior to onset of nausea and vomiting.  Emesis was of food contents, but also reported having a little bit of blood present yesterday.  She had tried using Zofran without any improvement in symptoms.  She reports symptoms are typical of her gastroparesis.  She reports associated symptoms of having chills and cough that is productive with clear sputum.  Denies having any dysuria.  Patient is not on any antiacid medications at baseline.  She does admit that she has been taking NSAIDs 1-2 times per week for pain symptoms on average.  In the emergency department patient was noted to be afebrile, pulse 74-1 19, respirations 8-22, and all other vital signs maintained.  Labs significant for WBC 20, platelets 476, potassium 3.2, CO2 22, BUN 13, creatinine 1.08, anion gap 18, lipase 20, and all other labs relatively within normal limits.  Urinalysis was positive for glucose, 80 ketones, 100 protein, and no bacteria.  CT scan of the abdomen pelvis noted partially visualized distal esophagus with marked esophageal wall thickening concerning for esophagitis without any acute intraabdominal or intrapelvic abnormality.  Patient having given 2 L normal saline IV fluids, resting, Zofran, morphine, Benadryl, and Bentyl without relief.  Review of Systems: As mentioned in the history of present illness. All other systems reviewed and are  negative. Past Medical History:  Diagnosis Date   Allergic rhinitis 07/07/2021   COVID 2020   mild   Diabetes mellitus (HCC)    Type 1, type 5   Dysmenorrhea 02/20/2019   GERD (gastroesophageal reflux disease)    Headache    History of herpes genitalis 06/24/2021   Intractable vomiting    SIRS (systemic inflammatory response syndrome) (HCC) 12/03/2019   Past Surgical History:  Procedure Laterality Date   DILATION AND EVACUATION N/A 07/05/2022   Procedure: DILATATION AND EVACUATION;  Surgeon: [place] Bing, MD;  Location: [facility] OR;  Service: Gynecology;  Laterality: N/A;   OPERATIVE ULTRASOUND N/A 07/05/2022   Procedure: OPERATIVE ULTRASOUND;  Surgeon: Kinsley Bing, MD;  Location: [facility] OR;  Service: Gynecology;  Laterality: N/A;   Social History:  reports that she has been smoking cigarettes. She has never used smokeless tobacco. She reports current alcohol use. She reports current drug use. Drug: Marijuana.  No Known Allergies  Family History  Problem Relation Age of Onset   Diabetes Paternal Grandfather    Heart attack Paternal Grandfather    Diabetes Paternal Grandmother    Cancer Maternal Grandmother        lung   Alcohol abuse Maternal Grandfather    Alcohol abuse Father    Drug abuse Father    High Cholesterol Father    Alcohol abuse Mother    Depression Mother    Drug abuse Mother     Prior to Admission medications   Medication Sig Start Date End Date Taking? Authorizing Provider  ibuprofen (ADVIL) 200 MG tablet Take 200 mg by mouth as needed for moderate pain.  Yes [provider]  insulin aspart (NOVOLOG) 100 UNIT/ML injection Max Daily 60 units per pump Patient taking differently: Inject 10 Units into the skin 3 (three) times daily with meals. Via pump Pump is also injecting per sliding scale per pt 06/08/22  Yes Shamleffer, Konrad Dolores, MD  linaclotide [facility]) 72 MCG capsule Take 1 capsule (72 mcg total) by mouth daily before breakfast. 12/21/22  06/19/23 Yes Celso Amy, PA-C  metoCLOPramide (REGLAN) 5 MG tablet Take 1 tablet (5 mg total) by mouth 3 (three) times daily. 12/21/22 06/19/23 Yes Celso Amy, PA-C  Multiple Vitamins-Minerals (MULTIVITAMIN WITH MINERALS) tablet Take 2 tablets by mouth daily.   Yes [provider]  naproxen sodium (ALEVE) 220 MG tablet Take 440 mg by mouth daily as needed (pain).   Yes [provider]  norethindrone (MICRONOR) 0.35 MG tablet Take 0.35 mg by mouth daily. 12/18/22  Yes [provider]  ondansetron (ZOFRAN) 4 MG tablet Take 1 tablet (4 mg total) by mouth every 8 (eight) hours as needed for nausea or vomiting. 12/21/22 06/19/23 Yes Celso Amy, PA-C  Continuous Glucose Sensor (DEXCOM G7 SENSOR) MISC Change every 10 days 11/27/22   Shamleffer, Konrad Dolores, MD    Physical Exam: Vitals:   12/28/22 0300 12/28/22 0400 12/28/22 0626 12/28/22 0744  BP: (!) 136/91 (!) 159/96 119/75   Pulse: (!) 104 83 82 86  Resp: (!) 8 (!) 21 19 19   Temp:   97.7 F (36.5 C)   TempSrc:      SpO2: (!) 85% 100% 100% 100%  Weight:      Height:        Constitutional: Young female who appears to be in no acute distress at this time. Eyes: PERRL, lids and conjunctivae normal ENMT: Mucous membranes are moist. Posterior pharynx clear of any exudate or lesions.Normal dentition.  Neck: normal, supple, no masses, no thyromegaly Respiratory: clear to auscultation bilaterally, no wheezing, no crackles. Normal respiratory effort.   Cardiovascular: Regular rate and rhythm, no murmurs / rubs / gallops. No extremity edema.  Abdomen: Mild epigastric tenderness to palpation appreciated.  Bowel sounds present in all 4 quadrants. Musculoskeletal: no clubbing / cyanosis.  Good ROM, no contractures. Normal muscle tone.  Skin: no rashes, lesions, ulcers. No induration Neurologic: CN 2-12 grossly intact.   Strength 5/5 in all 4.  Psychiatric: Normal judgment and insight. Alert and oriented x 3. Normal  mood.   Data Reviewed:  EKG reveals sinus arrhythmia at 80 bpm with artifact present.  Reviewed labs, imaging, and pertinent records  Assessment and Plan: Intractable nausea and vomiting GERD with esophagitis Gastroparesis Acute.  Patient presents with complaints of abdominal pain with nausea and vomiting.  Urinalysis did not show any signs for infection.  CT scan of the abdomen pelvis noted partially visualized distal esophagus with marked esophageal wall thickening concerning for esophagitis without any acute intraabdominal or intrapelvic abnormality.  -Admit to a telemetry bed -Aspiration precautions with elevation head of bed -Monitor intake and output -Clear liquid diet for now and advance as tolerated -Carafate 1 g 4 times daily -Protonix 40 mg IV twice daily -Change Reglan 5 mg IV 3 times daily -May warrant referral to gastroenterology in the outpatient setting possible need of EGD  Leukocytosis Acute  .  WBC trending up from 16.5 to 20.  Patient remains afebrile at this time.  Possibly reactive to above. -Check order chest x-ray -Check procalcitonin -Recheck CBC tomorrow  Acute kidney injury Creatinine elevated up to 1.08,  but baseline previously noted to be 0.6-0.7.  Suspect acute kidney injury secondary to -Continue normal saline IV fluids -Recheck kidney function in a.m.  Hypokalemia Acute.  Potassium 3.2.  Suspect secondary to nausea and vomiting symptoms. -Add 40 meq of potassium chloride to IV fluids -Recheck potassium levels in a.m.  Cough Acute.  Patient reports having a intermittent productive cough with clear sputum.  Lungs sound clear. Possibly secondary to reflux symptoms. -Check COVID, influenza, RSV screening -Check procalcitonin -Check chest x-ray -Other treatments as noted above  Diabetes mellitus type 1, with use of insulin pump. On admission glucose 124.  Last hemoglobin A1c 7.6 on 09/05/2022.  Patient is normally on insulin pump at baseline.   Patient's insulin pump was discontinued while she was in the emergency department. -Hypoglycemic protocols -Semglee 8 units daily -CBGs every 4 hours with very sensitive SSI -Adjust insulin regimen as needed -Appreciate diabetes educator coordinator  Thrombocytosis Acute.  Platelet count 476. -Continue to monitor  DVT prophylaxis: Lovenox Advance Care Planning:   Code Status: Full Code   Consults: Diabetic coordinator  Family Communication: No family recommended  Severity of Illness: The appropriate patient status for this patient is OBSERVATION. Observation status is judged to be reasonable and necessary in order to provide the required intensity of service to ensure the patient's safety. The patient's presenting symptoms, physical exam findings, and initial radiographic and laboratory data in the context of their medical condition is felt to place them at decreased risk for further clinical deterioration. Furthermore, it is anticipated that the patient will be medically stable for discharge from the hospital within 2 midnights of admission.   Author: Clydie Braun, MD 12/28/2022 7:58 AM  For on call review [email or web address].

## 2022-12-29 DIAGNOSIS — E876 Hypokalemia: Secondary | ICD-10-CM | POA: Diagnosis present

## 2022-12-29 DIAGNOSIS — Z8616 Personal history of COVID-19: Secondary | ICD-10-CM | POA: Diagnosis not present

## 2022-12-29 DIAGNOSIS — F1290 Cannabis use, unspecified, uncomplicated: Secondary | ICD-10-CM | POA: Diagnosis present

## 2022-12-29 DIAGNOSIS — Z79899 Other long term (current) drug therapy: Secondary | ICD-10-CM | POA: Diagnosis not present

## 2022-12-29 DIAGNOSIS — R059 Cough, unspecified: Secondary | ICD-10-CM | POA: Diagnosis present

## 2022-12-29 DIAGNOSIS — R112 Nausea with vomiting, unspecified: Secondary | ICD-10-CM | POA: Diagnosis not present

## 2022-12-29 DIAGNOSIS — Z813 Family history of other psychoactive substance abuse and dependence: Secondary | ICD-10-CM | POA: Diagnosis not present

## 2022-12-29 DIAGNOSIS — Z833 Family history of diabetes mellitus: Secondary | ICD-10-CM | POA: Diagnosis not present

## 2022-12-29 DIAGNOSIS — D72829 Elevated white blood cell count, unspecified: Secondary | ICD-10-CM | POA: Diagnosis present

## 2022-12-29 DIAGNOSIS — E872 Acidosis, unspecified: Secondary | ICD-10-CM | POA: Diagnosis present

## 2022-12-29 DIAGNOSIS — N179 Acute kidney failure, unspecified: Secondary | ICD-10-CM | POA: Diagnosis present

## 2022-12-29 DIAGNOSIS — K3184 Gastroparesis: Secondary | ICD-10-CM | POA: Diagnosis present

## 2022-12-29 DIAGNOSIS — Z8249 Family history of ischemic heart disease and other diseases of the circulatory system: Secondary | ICD-10-CM | POA: Diagnosis not present

## 2022-12-29 DIAGNOSIS — Z1152 Encounter for screening for COVID-19: Secondary | ICD-10-CM | POA: Diagnosis not present

## 2022-12-29 DIAGNOSIS — D75839 Thrombocytosis, unspecified: Secondary | ICD-10-CM | POA: Diagnosis present

## 2022-12-29 DIAGNOSIS — E1043 Type 1 diabetes mellitus with diabetic autonomic (poly)neuropathy: Secondary | ICD-10-CM | POA: Diagnosis present

## 2022-12-29 DIAGNOSIS — F17210 Nicotine dependence, cigarettes, uncomplicated: Secondary | ICD-10-CM | POA: Diagnosis present

## 2022-12-29 DIAGNOSIS — Z818 Family history of other mental and behavioral disorders: Secondary | ICD-10-CM | POA: Diagnosis not present

## 2022-12-29 DIAGNOSIS — Z83438 Family history of other disorder of lipoprotein metabolism and other lipidemia: Secondary | ICD-10-CM | POA: Diagnosis not present

## 2022-12-29 DIAGNOSIS — K2100 Gastro-esophageal reflux disease with esophagitis, without bleeding: Secondary | ICD-10-CM | POA: Diagnosis present

## 2022-12-29 DIAGNOSIS — E860 Dehydration: Secondary | ICD-10-CM | POA: Diagnosis present

## 2022-12-29 DIAGNOSIS — Z794 Long term (current) use of insulin: Secondary | ICD-10-CM | POA: Diagnosis not present

## 2022-12-29 DIAGNOSIS — Z9641 Presence of insulin pump (external) (internal): Secondary | ICD-10-CM | POA: Diagnosis present

## 2022-12-29 LAB — GLUCOSE, CAPILLARY
Glucose-Capillary: 209 mg/dL — ABNORMAL HIGH (ref 70–99)
Glucose-Capillary: 239 mg/dL — ABNORMAL HIGH (ref 70–99)
Glucose-Capillary: 178 mg/dL — ABNORMAL HIGH (ref 70–99)
Glucose-Capillary: 138 mg/dL — ABNORMAL HIGH (ref 70–99)
Glucose-Capillary: 199 mg/dL — ABNORMAL HIGH (ref 70–99)

## 2022-12-29 LAB — CBC
HCT: 41.2 % (ref 36.0–46.0)
Hemoglobin: 13.9 g/dL (ref 12.0–15.0)
MCH: 31.2 pg (ref 26.0–34.0)
MCHC: 33.7 g/dL (ref 30.0–36.0)
MCV: 92.4 fL (ref 80.0–100.0)
Platelets: 398 10*3/uL (ref 150–400)
RBC: 4.46 MIL/uL (ref 3.87–5.11)
RDW: 13.3 % (ref 11.5–15.5)
WBC: 10.2 10*3/uL (ref 4.0–10.5)
nRBC: 0 % (ref 0.0–0.2)

## 2022-12-29 LAB — BASIC METABOLIC PANEL
Anion gap: 17 — ABNORMAL HIGH (ref 5–15)
BUN: <5 mg/dL — ABNORMAL LOW (ref 6–20)
CO2: 18 mmol/L — ABNORMAL LOW (ref 22–32)
Calcium: 8.9 mg/dL (ref 8.9–10.3)
Chloride: 98 mmol/L (ref 98–111)
Creatinine, Ser: 0.9 mg/dL (ref 0.44–1.00)
GFR, Estimated: >60 mL/min (ref >60)
Glucose, Bld: 297 mg/dL — ABNORMAL HIGH (ref 70–99)
Potassium: 3.4 mmol/L — ABNORMAL LOW (ref 3.5–5.1)
Sodium: 133 mmol/L — ABNORMAL LOW (ref 135–145)

## 2022-12-29 MED ORDER — INSULIN GLARGINE-YFGN 100 UNIT/ML ~~LOC~~ SOLN
10.0000 [IU] | Freq: Every day | SUBCUTANEOUS | Status: DC
  Administered 2022-12-29: 10 [IU] via SUBCUTANEOUS
  Filled 2022-12-29 (×2): qty 0.1

## 2022-12-29 MED ORDER — SODIUM CHLORIDE 0.9 % IV SOLN: INTRAVENOUS | Status: DC

## 2022-12-29 MED ORDER — INSULIN ASPART 100 UNIT/ML IJ SOLN
0.0000 [IU] | INTRAMUSCULAR | Status: DC
  Administered 2022-12-29: 1 [IU] via SUBCUTANEOUS
  Administered 2022-12-29: 2 [IU] via SUBCUTANEOUS
  Administered 2022-12-29: 3 [IU] via SUBCUTANEOUS
  Administered 2022-12-29: 1 [IU] via SUBCUTANEOUS
  Administered 2022-12-30 (×2): 3 [IU] via SUBCUTANEOUS
  Administered 2022-12-30: 7 [IU] via SUBCUTANEOUS
  Administered 2022-12-30 (×2): 3 [IU] via SUBCUTANEOUS
  Administered 2022-12-30: 2 [IU] via SUBCUTANEOUS
  Administered 2022-12-31: 7 [IU] via SUBCUTANEOUS
  Administered 2022-12-31: 2 [IU] via SUBCUTANEOUS

## 2022-12-29 MED ORDER — POTASSIUM CHLORIDE 10 MEQ/100ML IV SOLN
10.0000 meq | INTRAVENOUS | Status: AC
  Administered 2022-12-29 (×4): 10 meq via INTRAVENOUS
  Filled 2022-12-29 (×4): qty 100

## 2022-12-29 MED ORDER — MORPHINE SULFATE (PF) 2 MG/ML IV SOLN
2.0000 mg | INTRAVENOUS | Status: DC | PRN
  Administered 2022-12-29 – 2022-12-31 (×5): 2 mg via INTRAVENOUS
  Filled 2022-12-29 (×7): qty 1

## 2022-12-29 MED ORDER — MORPHINE SULFATE (PF) 2 MG/ML IV SOLN
2.0000 mg | INTRAVENOUS | Status: AC | PRN
  Administered 2022-12-29 (×3): 2 mg via INTRAVENOUS
  Filled 2022-12-29 (×3): qty 1

## 2022-12-29 MED ORDER — ALUM & MAG HYDROXIDE-SIMETH 200-200-20 MG/5ML PO SUSP
30.0000 mL | ORAL | Status: DC | PRN
  Administered 2022-12-29: 30 mL via ORAL
  Filled 2022-12-29: qty 30

## 2022-12-29 NOTE — Progress Notes (Signed)
Transition of Care [facility]) - Inpatient Brief Assessment   Patient Details  Name: [name] MRN: [id number] Date of Birth: [birth year]  [facility] CM/SW Contact:    Janae Bridgeman, RN Phone Number: 12/29/2022, 11:22 AM   Clinical Narrative: Patient admitted for intractable nausea/vomiting.  No TOC needs at this time.   Transition of Care Asessment: Insurance and Status: (P) Insurance coverage has been reviewed Patient has primary care physician: (P) Yes Home environment has been reviewed: (P) From Apartment Prior level of function:: (P) Independent Prior/Current Home Services: (P) No current home services Social Determinants of Health Reivew: (P) SDOH reviewed no interventions necessary Readmission risk has been reviewed: (P) Yes Transition of care needs: (P) no transition of care needs at this time

## 2022-12-29 NOTE — Inpatient Diabetes Management (Signed)
Inpatient Diabetes Program Recommendations  AACE/ADA: New Consensus Statement on Inpatient Glycemic Control (2015)  Target Ranges:  Prepandial:   less than 140 mg/dL      Peak postprandial:   less than 180 mg/dL (1-2 hours)      Critically ill patients:  140 - 180 mg/dL    Latest Reference Range & Units 12/29/22 06:00  Sodium 135 - 145 mmol/L 133 (L)  Potassium 3.5 - 5.1 mmol/L 3.4 (L)  Chloride 98 - 111 mmol/L 98  CO2 22 - 32 mmol/L 18 (L)  Glucose 70 - 99 mg/dL 829 (H)  BUN 6 - 20 mg/dL <5 (L)  Creatinine 5.62 - 1.00 mg/dL 1.30  Calcium 8.9 - 86.5 mg/dL 8.9  Anion gap 5 - 15  17 (H)  (L): Data is abnormally low (H): Data is abnormally high  Latest Reference Range & Units 12/28/22 06:31 12/28/22 11:40 12/28/22 15:39 12/28/22 19:48 12/28/22 23:36 12/29/22 03:36 12/29/22 07:33  Glucose-Capillary 70 - 99 mg/dL 57 (L) 784 (H)  9 units Novolog @1220   8 units Semglee @1425  243 (H)  2 units Novolog  195 (H)  1 unit Novolog  203 (H)  2 units Novolog @0115  209 (H)  2 units Novolog @0445  239 (H)  (L): Data is abnormally low (H): Data is abnormally high    Admit with:  Intractable nausea and vomiting GERD with esophagitis Gastroparesis  History: Type 1 diabetes  Home DM Meds: Tandem T Slim Insulin Pump  Current Orders: Semglee 8 units Daily      Novolog 0-6 units Q4 hours     MD- Note Insulin Pump removed yesterday and SQ Basal/Bolus regimen started  Note that 6am BMET today shows Anion Gap elevated to 17 and CO2 slightly down at 18  Does pt need more IV Fluids?  Based on Current CBGs, please also consider:  1. Increase Semglee to 10 units Daily  2. Increase Novolog SSI to the 0-9 unit scale Q4 hours    --Will follow patient during hospitalization--  Ambrose Finland RN, MSN, CDCES Diabetes Coordinator Inpatient Glycemic Control Team Team Pager: [phone] (8a-5p)

## 2022-12-29 NOTE — Progress Notes (Addendum)
PROGRESS NOTE  Judy Cook  YSA:[id number] DOB: 08/19/2000 DOA: 12/28/2022 PCP: Elenore Paddy, NP   Brief Narrative: Patient is a 22 year old female with past medical history of diabetes type 1 with gastroparesis, on insulin pump at home presented with a complaint of abdomen pain, nausea vomiting.  On presentation, lab work showed WBC count of 20,000, potassium 3.2, anion gap of 18, UA was positive for ketones.  CT abdomen/pelvis showed partially visualized distal esophagus with marked esophageal wall thickening concerning for esophagitis without any acute intra-abdominal/intrapelvic findings.  Patient started on IV fluid, antiemetics.  Patient also smokes marijuana daily.  Assessment & Plan:  Principal Problem:   Intractable nausea and vomiting Active Problems:   Gastroparesis   GERD with esophagitis   Leukocytosis   AKI (acute kidney injury) (HCC)   Hypokalemia   Cough   Type 1 diabetes mellitus without complication (HCC)   Thrombocytosis   Intractable nausea/vomiting: Likely from diabetic gastroparesis versus esophagitis versus hyperemesis cannabis syndrome.  Report of small marijuana daily.  CT abdomen/pelvis showed possible esophagitis without any other finding of intra-abdominal or intrapelvic abnormality.  Continue Reglan, Protonix IV, Carafate.  Currently on clear liquid diet.  Will advance to full liquid.  We might consider gastroenterology consultation if she continues to have the symptoms  Diabetes type 1/anion gap metabolic acidosis: History of type 1 diabetes, on insulin pump at home.  Recent A1c of 7.6.  Currently on sliding scale and long-acting insulin.  Diabetic coordinator following.  CO2 of 17 today.  Monitor blood sugars  Leukocytosis: Resolved  AKI: Currently kidney function at baseline  Hypokalemia: Supplemented and be monitored  Cough: Chest x-ray did not show any acute findings.  COVID/influenza/RSV negative          DVT prophylaxis:enoxaparin  (LOVENOX) injection 40 mg Start: 12/28/22 2200     Code Status: Full Code  Family Communication: Non eat bedside  Patient status:Obs  Patient is from :Home  Anticipated discharge NA:TFTD  Estimated DC date:1-2 days   Consultants: None  Procedures:None  Antimicrobials:  Anti-infectives (From admission, onward)    None       Subjective: Patient seen and examined at bedside today.  She feels better.  No vomiting but has some nausea today.  Denies any abdomen pain.  She said that she smokes marijuana every day.  Objective: Vitals:   12/28/22 0909 12/28/22 1952 12/29/22 0338 12/29/22 0733  BP: (!) 143/86 (!) 162/97 138/88 (!) 153/96  Pulse: 95 96 84 83  Resp:  15 15 16   Temp: 98.9 F (37.2 C) 99.1 F (37.3 C) 98.8 F (37.1 C) 98.9 F (37.2 C)  TempSrc:    Oral  SpO2: 100% 100% 100% 100%  Weight:      Height:        Intake/Output Summary (Last 24 hours) at 12/29/2022 0836 Last data filed at 12/28/2022 1900 Gross per 24 hour  Intake 360 ml  Output --  Net 360 ml   Filed Weights   12/28/22 0216  Weight: 61 kg    Examination:  General exam: Overall comfortable, not in distress HEENT: PERRL Respiratory system:  no wheezes or crackles  Cardiovascular system: S1 & S2 heard, RRR.  Gastrointestinal system: Abdomen is nondistended, soft and nontender. Central nervous system: Alert and oriented Extremities: No edema, no clubbing ,no cyanosis Skin: No rashes, no ulcers,no icterus     Data Reviewed: I have personally reviewed following labs and imaging studies  CBC: Recent Labs  Lab 12/26/22 0415  12/27/22 0837 12/28/22 0258 12/29/22 0600  WBC 8.3 16.5* 20.0* 10.2  NEUTROABS 6.5  --   --   --   HGB 14.2 14.9 14.6 13.9  HCT 41.2 43.4 41.1 41.2  MCV 91.4 90.2 86.7 92.4  PLT 402* 508* 476* 398   Basic Metabolic Panel: Recent Labs  Lab 12/26/22 0415 12/27/22 0837 12/28/22 0258 12/29/22 0600  NA 138 135 135 133*  K 3.2* 3.6 3.2* 3.4*  CL 105 94*  95* 98  CO2 20* 20* 22 18*  GLUCOSE 164* 109* 124* 297*  BUN 10 13 13  <5*  CREATININE 0.78 1.01* 1.08* 0.90  CALCIUM 9.3 9.5 9.9 8.9     Recent Results (from the past 240 hour(s))  Resp panel by RT-PCR (RSV, Flu A&B, Covid) Anterior Nasal Swab     Status: None   Collection Time: 12/28/22  6:22 PM   Specimen: Anterior Nasal Swab  Result Value Ref Range Status   SARS Coronavirus 2 by RT PCR NEGATIVE NEGATIVE Final   Influenza A by PCR NEGATIVE NEGATIVE Final   Influenza B by PCR NEGATIVE NEGATIVE Final    Comment: (NOTE) The Xpert Xpress SARS-CoV-2/FLU/RSV plus assay is intended as an aid in the diagnosis of influenza from Nasopharyngeal swab specimens and should not be used as a sole basis for treatment. Nasal washings and aspirates are unacceptable for Xpert Xpress SARS-CoV-2/FLU/RSV testing.  Fact Sheet for Patients: BloggerCourse.com  Fact Sheet for Healthcare Providers: SeriousBroker.it  This test is not yet approved or cleared by the [place] FDA and has been authorized for detection and/or diagnosis of SARS-CoV-2 by FDA under an Emergency Use Authorization (EUA). This EUA will remain in effect (meaning this test can be used) for the duration of the COVID-19 declaration under Section 564(b)(1) of the Act, 21 U.S.C. section 360bbb-3(b)(1), unless the authorization is terminated or revoked.     Resp Syncytial Virus by PCR NEGATIVE NEGATIVE Final    Comment: (NOTE) Fact Sheet for Patients: BloggerCourse.com  Fact Sheet for Healthcare Providers: SeriousBroker.it  This test is not yet approved or cleared by the [place] FDA and has been authorized for detection and/or diagnosis of SARS-CoV-2 by FDA under an Emergency Use Authorization (EUA). This EUA will remain in effect (meaning this test can be used) for the duration of the COVID-19 declaration under Section  564(b)(1) of the Act, 21 U.S.C. section 360bbb-3(b)(1), unless the authorization is terminated or revoked.  Performed at [facility], [address], [place], [place] [address]      Radiology Studies: DG CHEST PORT 1 VIEW  Result Date: 12/28/2022 CLINICAL DATA:  Nausea and vomiting. EXAM: PORTABLE CHEST 1 VIEW COMPARISON:  CT abdomen pelvis-12/28/2022 FINDINGS: Normal cardiac silhouette and mediastinal contours. No focal parenchymal opacities. No pleural effusion or pneumothorax. No evidence of edema. No acute osseous abnormalities. IMPRESSION: No acute cardiopulmonary disease. Electronically Signed   By: Simonne Come M.D.   On: 12/28/2022 11:14   CT ABDOMEN PELVIS W CONTRAST  Result Date: 12/28/2022 CLINICAL DATA:  Abdominal pain, acute, nonlocalized Hx of gastroperesis and diabetes. CBG in the 200s with EMS. All other VSS EXAM: CT ABDOMEN AND PELVIS WITH CONTRAST TECHNIQUE: Multidetector CT imaging of the abdomen and pelvis was performed using the standard protocol following bolus administration of intravenous contrast. RADIATION DOSE REDUCTION: This exam was performed according to the departmental dose-optimization program which includes automated exposure control, adjustment of the mA and/or kV according to patient size and/or use of iterative reconstruction  technique. CONTRAST:  75mL OMNIPAQUE IOHEXOL 350 MG/ML SOLN COMPARISON:  CT abdomen pelvis 05/16/2022, CT abdomen pelvis 09/04/2022. FINDINGS: Lower chest: Partially visualized distal esophagus demonstrates marked esophageal wall thickening. Old healed left rib fractures. Hepatobiliary: No focal liver abnormality. No gallstones, gallbladder wall thickening, or pericholecystic fluid. No biliary dilatation. Pancreas: No focal lesion. Normal pancreatic contour. No surrounding inflammatory changes. No main pancreatic ductal dilatation. Spleen: Normal in size without focal abnormality. Adrenals/Urinary Tract: No adrenal nodule bilaterally.  Bilateral kidneys enhance symmetrically. No hydronephrosis. No hydroureter. The urinary bladder is unremarkable. Stomach/Bowel: Stomach is within normal limits. No evidence of bowel wall thickening or dilatation. Appendix appears normal. Vascular/Lymphatic: No abdominal aorta or iliac aneurysm. Mild atherosclerotic plaque of the aorta and its branches. No abdominal, pelvic, or inguinal lymphadenopathy. Reproductive: Uterus and bilateral adnexa are unremarkable. Other: No intraperitoneal free fluid. No intraperitoneal free gas. No organized fluid collection. Musculoskeletal: No abdominal wall hernia or abnormality. No suspicious lytic or blastic osseous lesions. No acute displaced fracture. IMPRESSION: 1. Partially visualized distal esophagus demonstrates marked esophageal wall thickening. Correlate with signs and symptoms of esophagitis. Consider direct visualization. 2. No acute intra-abdominal or intrapelvic abnormality. Electronically Signed   By: Tish Frederickson M.D.   On: 12/28/2022 03:45    Scheduled Meds:  dicyclomine  10 mg Oral Once   enoxaparin (LOVENOX) injection  40 mg Subcutaneous Q24H   insulin aspart  0-6 Units Subcutaneous Q4H   insulin glargine-yfgn  8 Units Subcutaneous Daily   metoCLOPramide (REGLAN) injection  10 mg Intravenous Q8H   pantoprazole (PROTONIX) IV  40 mg Intravenous Q12H   sodium chloride flush  3 mL Intravenous Q12H   sucralfate  1 g Oral TID WC & HS   Continuous Infusions:   LOS: 0 days   Burnadette Pop, MD [facility] P9/09/2022, 8:36 AM

## 2022-12-30 DIAGNOSIS — R112 Nausea with vomiting, unspecified: Secondary | ICD-10-CM | POA: Diagnosis not present

## 2022-12-30 LAB — BASIC METABOLIC PANEL
Anion gap: 16 — ABNORMAL HIGH (ref 5–15)
BUN: <5 mg/dL — ABNORMAL LOW (ref 6–20)
CO2: 20 mmol/L — ABNORMAL LOW (ref 22–32)
Calcium: 8.4 mg/dL — ABNORMAL LOW (ref 8.9–10.3)
Chloride: 97 mmol/L — ABNORMAL LOW (ref 98–111)
Creatinine, Ser: 0.85 mg/dL (ref 0.44–1.00)
GFR, Estimated: >60 mL/min (ref >60)
Glucose, Bld: 275 mg/dL — ABNORMAL HIGH (ref 70–99)
Potassium: 3.3 mmol/L — ABNORMAL LOW (ref 3.5–5.1)
Sodium: 133 mmol/L — ABNORMAL LOW (ref 135–145)

## 2022-12-30 LAB — GLUCOSE, CAPILLARY
Glucose-Capillary: 241 mg/dL — ABNORMAL HIGH (ref 70–99)
Glucose-Capillary: 224 mg/dL — ABNORMAL HIGH (ref 70–99)
Glucose-Capillary: 232 mg/dL — ABNORMAL HIGH (ref 70–99)
Glucose-Capillary: 246 mg/dL — ABNORMAL HIGH (ref 70–99)
Glucose-Capillary: 173 mg/dL — ABNORMAL HIGH (ref 70–99)
Glucose-Capillary: 322 mg/dL — ABNORMAL HIGH (ref 70–99)

## 2022-12-30 LAB — MAGNESIUM: Magnesium: 1.8 mg/dL (ref 1.7–2.4)

## 2022-12-30 MED ORDER — POTASSIUM CHLORIDE CRYS ER 20 MEQ PO TBCR
40.0000 meq | EXTENDED_RELEASE_TABLET | Freq: Once | ORAL | Status: AC
  Administered 2022-12-30: 40 meq via ORAL
  Filled 2022-12-30: qty 2

## 2022-12-30 MED ORDER — INSULIN GLARGINE-YFGN 100 UNIT/ML ~~LOC~~ SOLN
15.0000 [IU] | Freq: Every day | SUBCUTANEOUS | Status: DC
  Administered 2022-12-30 – 2022-12-31 (×2): 15 [IU] via SUBCUTANEOUS
  Filled 2022-12-30 (×2): qty 0.15

## 2022-12-30 MED ORDER — POTASSIUM CHLORIDE 10 MEQ/100ML IV SOLN
10.0000 meq | INTRAVENOUS | Status: DC
  Administered 2022-12-30 (×2): 10 meq via INTRAVENOUS
  Filled 2022-12-30 (×4): qty 100

## 2022-12-30 NOTE — Progress Notes (Signed)
PROGRESS NOTE  Judy Cook  YNW:[id number] DOB: [birth year] DOA: 12/28/2022 PCP: Elenore Paddy, NP   Brief Narrative: Patient is a 22 year old female with past medical history of diabetes type 1 with gastroparesis, on insulin pump at home presented with a complaint of abdomen pain, nausea vomiting.  On presentation, lab work showed WBC count of 20,000, potassium 3.2, anion gap of 18, UA was positive for ketones.  CT abdomen/pelvis showed partially visualized distal esophagus with marked esophageal wall thickening concerning for esophagitis without any acute intra-abdominal/intrapelvic findings.  Patient started on IV fluid, antiemetics.  Patient also smokes marijuana daily.  Being managed for intractable nausea and vomiting likely secondary to diabetic gastroparesis along with cannabis hyperemesis syndrome   Assessment & Plan:  Principal Problem:   Intractable nausea and vomiting Active Problems:   Gastroparesis   GERD with esophagitis   Leukocytosis   AKI (acute kidney injury) (HCC)   Hypokalemia   Cough   Type 1 diabetes mellitus without complication (HCC)   Thrombocytosis   Intractable nausea/vomiting/abdominal pain: Likely from diabetic gastroparesis versus esophagitis versus hyperemesis cannabis syndrome.  Report of smoking marijuana daily.  CT abdomen/pelvis showed possible esophagitis without any other finding of intra-abdominal or intrapelvic abnormality.  Continue Reglan, Protonix IV, Carafate.  Currently on full  liquid diet.   Continue IV fluid  Diabetes type 1/anion gap metabolic acidosis: History of type 1 diabetes, on insulin pump at home.  Recent A1c of 7.6.  Currently on sliding scale and long-acting insulin.  Diabetic coordinator following.  CO2 of 20 today.  Monitor blood sugars  Leukocytosis: Resolved  AKI: Resolved with IV fluid  Hypokalemia: Supplemented and be monitored  Cough: Chest x-ray did not show any acute findings.  COVID/influenza/RSV  negative.          DVT prophylaxis:enoxaparin (LOVENOX) injection 40 mg Start: 12/28/22 2200     Code Status: Full Code  Family Communication: None at bedside  Patient status: Inpatient  Patient is from :Home  Anticipated discharge [place]:[facility]  Estimated DC date:1-2 days   Consultants: None  Procedures:None  Antimicrobials:  Anti-infectives (From admission, onward)    None       Subjective: Patient seen and examined the bedside today.  She feels little better today but still having nausea, intermittent abdominal discomfort.  She is currently on full liquid diet.  We decided to continue her on for liquid diet and monitor.  Objective: Vitals:   12/29/22 1629 12/29/22 2036 12/30/22 0431 12/30/22 0800  BP: (!) 150/95 (!) 159/100 (!) 157/96 (!) 140/94  Pulse: 73 74 78 87  Resp: 16 18 16 16   Temp: 99.5 F (37.5 C) 98.5 F (36.9 C) 98.8 F (37.1 C) 98.3 F (36.8 C)  TempSrc: Oral Oral Axillary Oral  SpO2: 100% 100% 100% 96%  Weight:      Height:        Intake/Output Summary (Last 24 hours) at 12/30/2022 1138 Last data filed at 12/30/2022 0253 Gross per 24 hour  Intake 1413.6 ml  Output --  Net 1413.6 ml   Filed Weights   12/28/22 0216  Weight: 61 kg    Examination:   General exam: Overall comfortable, not in distress HEENT: PERRL Respiratory system:  no wheezes or crackles  Cardiovascular system: S1 & S2 heard, RRR.  Gastrointestinal system: Abdomen is nondistended, soft and nontender. Central nervous system: Alert and oriented Extremities: No edema, no clubbing ,no cyanosis Skin: No rashes, no ulcers,no icterus     Data Reviewed: I  have personally reviewed following labs and imaging studies  CBC: Recent Labs  Lab 12/26/22 0415 12/27/22 0837 12/28/22 0258 12/29/22 0600  WBC 8.3 16.5* 20.0* 10.2  NEUTROABS 6.5  --   --   --   HGB 14.2 14.9 14.6 13.9  HCT 41.2 43.4 41.1 41.2  MCV 91.4 90.2 86.7 92.4  PLT 402* 508* 476* 398   Basic  Metabolic Panel: Recent Labs  Lab 12/26/22 0415 12/27/22 0837 12/28/22 0258 12/29/22 0600 12/30/22 0605 12/30/22 0606  NA 138 135 135 133*  --  133*  K 3.2* 3.6 3.2* 3.4*  --  3.3*  CL 105 94* 95* 98  --  97*  CO2 20* 20* 22 18*  --  20*  GLUCOSE 164* 109* 124* 297*  --  275*  BUN 10 13 13  <5*  --  <5*  CREATININE 0.78 1.01* 1.08* 0.90  --  0.85  CALCIUM 9.3 9.5 9.9 8.9  --  8.4*  MG  --   --   --   --  1.8  --      Recent Results (from the past 240 hour(s))  Resp panel by RT-PCR (RSV, Flu A&B, Covid) Anterior Nasal Swab     Status: None   Collection Time: 12/28/22  6:22 PM   Specimen: Anterior Nasal Swab  Result Value Ref Range Status   SARS Coronavirus 2 by RT PCR NEGATIVE NEGATIVE Final   Influenza A by PCR NEGATIVE NEGATIVE Final   Influenza B by PCR NEGATIVE NEGATIVE Final    Comment: (NOTE) The Xpert Xpress SARS-CoV-2/FLU/RSV plus assay is intended as an aid in the diagnosis of influenza from Nasopharyngeal swab specimens and should not be used as a sole basis for treatment. Nasal washings and aspirates are unacceptable for Xpert Xpress SARS-CoV-2/FLU/RSV testing.  Fact Sheet for Patients: BloggerCourse.com  Fact Sheet for Healthcare Providers: SeriousBroker.it  This test is not yet approved or cleared by the [place] FDA and has been authorized for detection and/or diagnosis of SARS-CoV-2 by FDA under an Emergency Use Authorization (EUA). This EUA will remain in effect (meaning this test can be used) for the duration of the COVID-19 declaration under Section 564(b)(1) of the Act, 21 U.S.C. section 360bbb-3(b)(1), unless the authorization is terminated or revoked.     Resp Syncytial Virus by PCR NEGATIVE NEGATIVE Final    Comment: (NOTE) Fact Sheet for Patients: BloggerCourse.com  Fact Sheet for Healthcare Providers: SeriousBroker.it  This test is not  yet approved or cleared by the [place] FDA and has been authorized for detection and/or diagnosis of SARS-CoV-2 by FDA under an Emergency Use Authorization (EUA). This EUA will remain in effect (meaning this test can be used) for the duration of the COVID-19 declaration under Section 564(b)(1) of the Act, 21 U.S.C. section 360bbb-3(b)(1), unless the authorization is terminated or revoked.  Performed at [facility], [address], [place], [place] [address]      Radiology Studies: No results found.  Scheduled Meds:  dicyclomine  10 mg Oral Once   enoxaparin (LOVENOX) injection  40 mg Subcutaneous Q24H   insulin aspart  0-9 Units Subcutaneous Q4H   insulin glargine-yfgn  15 Units Subcutaneous Daily   metoCLOPramide (REGLAN) injection  10 mg Intravenous Q8H   pantoprazole (PROTONIX) IV  40 mg Intravenous Q12H   sodium chloride flush  3 mL Intravenous Q12H   sucralfate  1 g Oral TID WC & HS   Continuous Infusions:  sodium chloride 125 mL/hr  at 12/30/22 0545   potassium chloride 10 mEq (12/30/22 1031)     LOS: 1 day   Burnadette Pop, MD [facility] P9/10/2022, 11:38 AM

## 2022-12-30 NOTE — Plan of Care (Signed)

## 2022-12-31 DIAGNOSIS — R112 Nausea with vomiting, unspecified: Secondary | ICD-10-CM | POA: Diagnosis not present

## 2022-12-31 LAB — BASIC METABOLIC PANEL
Anion gap: 11 (ref 5–15)
BUN: <5 mg/dL — ABNORMAL LOW (ref 6–20)
CO2: 24 mmol/L (ref 22–32)
Calcium: 8.7 mg/dL — ABNORMAL LOW (ref 8.9–10.3)
Chloride: 100 mmol/L (ref 98–111)
Creatinine, Ser: 0.8 mg/dL (ref 0.44–1.00)
GFR, Estimated: >60 mL/min (ref >60)
Glucose, Bld: 85 mg/dL (ref 70–99)
Potassium: 3.1 mmol/L — ABNORMAL LOW (ref 3.5–5.1)
Sodium: 135 mmol/L (ref 135–145)

## 2022-12-31 LAB — GLUCOSE, CAPILLARY
Glucose-Capillary: 157 mg/dL — ABNORMAL HIGH (ref 70–99)
Glucose-Capillary: 71 mg/dL (ref 70–99)
Glucose-Capillary: 341 mg/dL — ABNORMAL HIGH (ref 70–99)

## 2022-12-31 MED ORDER — POTASSIUM CHLORIDE CRYS ER 20 MEQ PO TBCR: 40.0000 meq | EXTENDED_RELEASE_TABLET | Freq: Every day | ORAL | 0 refills | Status: DC

## 2022-12-31 MED ORDER — PANTOPRAZOLE SODIUM 40 MG PO TBEC: 40.0000 mg | DELAYED_RELEASE_TABLET | Freq: Two times a day (BID) | ORAL | 0 refills | Status: DC

## 2022-12-31 MED ORDER — POTASSIUM CHLORIDE CRYS ER 20 MEQ PO TBCR
40.0000 meq | EXTENDED_RELEASE_TABLET | ORAL | Status: AC
  Administered 2022-12-31 (×2): 40 meq via ORAL
  Filled 2022-12-31 (×2): qty 2

## 2022-12-31 NOTE — Discharge Summary (Signed)
Physician Discharge Summary  Judy Cook ZOX:[id number] DOB: Oct 10, 2000 DOA: 12/28/2022  PCP: Elenore Paddy, NP  Admit date: 12/28/2022 Discharge date: 12/31/2022  Admitted From: Home Disposition:  Home  Discharge Condition:Stable CODE STATUS:FULL Diet recommendation:  Carb Modified    Brief/Interim Summary: Patient is a 22 year old female with past medical history of diabetes type 1 with gastroparesis, on insulin pump at home presented with a complaint of abdomen pain, nausea vomiting.  On presentation, lab work showed WBC count of 20,000, potassium 3.2, anion gap of 18, UA was positive for ketones.  CT abdomen/pelvis showed partially visualized distal esophagus with marked esophageal wall thickening concerning for esophagitis without any acute intra-abdominal/intrapelvic findings.  Patient started on IV fluid, antiemetics.  Patient also smokes marijuana daily. She was managed for intractable nausea and vomiting likely secondary to diabetic gastroparesis along with cannabis hyperemesis syndrome .  This morning she feels much better, tolerating soft diet, no nausea or vomiting.  Denies any abdominal pain.  Very eager to go home.  Medically stable for discharge.  Following problems were addressed during the hospitalization:  Intractable nausea/vomiting/abdominal pain: Likely from diabetic gastroparesis versus esophagitis versus hyperemesis cannabis syndrome.  Report of smoking marijuana daily.  CT abdomen/pelvis showed possible esophagitis without any other finding of intra-abdominal or intrapelvic abnormality. She improved significantly with Reglan, Protonix IV, Carafate.  Currently she is tolerating soft diet.  Continue as needed Reglan at home.  Continue Protonix .  I have counseled her to stop smoking marijuana  diabetes type 1/anion gap metabolic acidosis: History of type 1 diabetes, on insulin pump at home.  Recent A1c of 7.6.  Currently on sliding scale and long-acting insulin.   Diabetic coordinator was following.  Continue insulin pump at home  Leukocytosis: Resolved   AKI: Resolved with IV fluid   Hypokalemia: Supplemented    Cough: Chest x-ray did not show any acute findings.  COVID/influenza/RSV negative.  Resolved   Discharge Diagnoses:  Principal Problem:   Intractable nausea and vomiting Active Problems:   Gastroparesis   GERD with esophagitis   Leukocytosis   AKI (acute kidney injury) (HCC)   Hypokalemia   Cough   Type 1 diabetes mellitus without complication (HCC)   Thrombocytosis    Discharge Instructions  Discharge Instructions     Diet Carb Modified   Complete by: As directed    Discharge instructions   Complete by: As directed    1)Please take prescribed medications as instructed 2)Follow  up with your PCP in a week  3)Take small volume low-fat frequent meals 4) monitor your blood sugars at home 5)Stop marijuana   Increase activity slowly   Complete by: As directed       Allergies as of 12/31/2022   No Known Allergies      Medication List     TAKE these medications    Dexcom G7 Sensor Misc Change every 10 days   ibuprofen 200 MG tablet Commonly known as: ADVIL Take 200 mg by mouth as needed for moderate pain.   insulin aspart 100 UNIT/ML injection Commonly known as: NovoLOG Max Daily 60 units per pump What changed:  how much to take how to take this when to take this additional instructions   linaclotide 72 MCG capsule Commonly known as: LINZESS Take 1 capsule (72 mcg total) by mouth daily before breakfast.   metoCLOPramide 5 MG tablet Commonly known as: Reglan Take 1 tablet (5 mg total) by mouth 3 (three) times daily.   multivitamin with minerals  tablet Take 2 tablets by mouth daily.   naproxen sodium 220 MG tablet Commonly known as: ALEVE Take 440 mg by mouth daily as needed (pain).   norethindrone 0.35 MG tablet Commonly known as: MICRONOR Take 0.35 mg by mouth daily.   ondansetron 4 MG  tablet Commonly known as: ZOFRAN Take 1 tablet (4 mg total) by mouth every 8 (eight) hours as needed for nausea or vomiting.   pantoprazole 40 MG tablet Commonly known as: Protonix Take 1 tablet (40 mg total) by mouth 2 (two) times daily.   potassium chloride SA 20 MEQ tablet Commonly known as: KLOR-CON M Take 2 tablets (40 mEq total) by mouth daily for 3 days. Start taking on: January 01, 2023        Follow-up Information     Elenore Paddy, NP. Schedule an appointment as soon as possible for a visit in 1 week(s).   Specialty: Nurse Practitioner Contact information: [address] [place] [address] [phone]                No Known Allergies  Consultations: None   Procedures/Studies: DG CHEST PORT 1 VIEW  Result Date: 12/28/2022 CLINICAL DATA:  Nausea and vomiting. EXAM: PORTABLE CHEST 1 VIEW COMPARISON:  CT abdomen pelvis-12/28/2022 FINDINGS: Normal cardiac silhouette and mediastinal contours. No focal parenchymal opacities. No pleural effusion or pneumothorax. No evidence of edema. No acute osseous abnormalities. IMPRESSION: No acute cardiopulmonary disease. Electronically Signed   By: Simonne Come M.D.   On: 12/28/2022 11:14   CT ABDOMEN PELVIS W CONTRAST  Result Date: 12/28/2022 CLINICAL DATA:  Abdominal pain, acute, nonlocalized Hx of gastroperesis and diabetes. CBG in the 200s with EMS. All other VSS EXAM: CT ABDOMEN AND PELVIS WITH CONTRAST TECHNIQUE: Multidetector CT imaging of the abdomen and pelvis was performed using the standard protocol following bolus administration of intravenous contrast. RADIATION DOSE REDUCTION: This exam was performed according to the departmental dose-optimization program which includes automated exposure control, adjustment of the mA and/or kV according to patient size and/or use of iterative reconstruction technique. CONTRAST:  75mL OMNIPAQUE IOHEXOL 350 MG/ML SOLN COMPARISON:  CT abdomen pelvis 05/16/2022, CT abdomen  pelvis 09/04/2022. FINDINGS: Lower chest: Partially visualized distal esophagus demonstrates marked esophageal wall thickening. Old healed left rib fractures. Hepatobiliary: No focal liver abnormality. No gallstones, gallbladder wall thickening, or pericholecystic fluid. No biliary dilatation. Pancreas: No focal lesion. Normal pancreatic contour. No surrounding inflammatory changes. No main pancreatic ductal dilatation. Spleen: Normal in size without focal abnormality. Adrenals/Urinary Tract: No adrenal nodule bilaterally. Bilateral kidneys enhance symmetrically. No hydronephrosis. No hydroureter. The urinary bladder is unremarkable. Stomach/Bowel: Stomach is within normal limits. No evidence of bowel wall thickening or dilatation. Appendix appears normal. Vascular/Lymphatic: No abdominal aorta or iliac aneurysm. Mild atherosclerotic plaque of the aorta and its branches. No abdominal, pelvic, or inguinal lymphadenopathy. Reproductive: Uterus and bilateral adnexa are unremarkable. Other: No intraperitoneal free fluid. No intraperitoneal free gas. No organized fluid collection. Musculoskeletal: No abdominal wall hernia or abnormality. No suspicious lytic or blastic osseous lesions. No acute displaced fracture. IMPRESSION: 1. Partially visualized distal esophagus demonstrates marked esophageal wall thickening. Correlate with signs and symptoms of esophagitis. Consider direct visualization. 2. No acute intra-abdominal or intrapelvic abnormality. Electronically Signed   By: Tish Frederickson M.D.   On: 12/28/2022 03:45      Subjective: Patient seen and examined at bedside today.  Feels very comfortable today.  No nausea, vomiting or abdominal pain.  Eager for discharge  Discharge  Exam: Vitals:   12/31/22 0544 12/31/22 0733  BP: (!) 141/97 (!) 128/92  Pulse: 78 69  Resp: 18 16  Temp: 98 F (36.7 C) 98.6 F (37 C)  SpO2: 100% 100%   Vitals:   12/30/22 1553 12/30/22 2004 12/31/22 0544 12/31/22 0733  BP:  (!) 148/100 (!) 143/88 (!) 141/97 (!) 128/92  Pulse: 79 93 78 69  Resp: 16 18 18 16   Temp: 98.9 F (37.2 C) 98.2 F (36.8 C) 98 F (36.7 C) 98.6 F (37 C)  TempSrc: Oral Oral Oral Oral  SpO2: 100% 100% 100% 100%  Weight:      Height:        General: Pt is alert, awake, not in acute distress Cardiovascular: RRR, S1/S2 +, no rubs, no gallops Respiratory: CTA bilaterally, no wheezing, no rhonchi Abdominal: Soft, NT, ND, bowel sounds + Extremities: no edema, no cyanosis    The results of significant diagnostics from this hospitalization (including imaging, microbiology, ancillary and laboratory) are listed below for reference.     Microbiology: Recent Results (from the past 240 hour(s))  Resp panel by RT-PCR (RSV, Flu A&B, Covid) Anterior Nasal Swab     Status: None   Collection Time: 12/28/22  6:22 PM   Specimen: Anterior Nasal Swab  Result Value Ref Range Status   SARS Coronavirus 2 by RT PCR NEGATIVE NEGATIVE Final   Influenza A by PCR NEGATIVE NEGATIVE Final   Influenza B by PCR NEGATIVE NEGATIVE Final    Comment: (NOTE) The Xpert Xpress SARS-CoV-2/FLU/RSV plus assay is intended as an aid in the diagnosis of influenza from Nasopharyngeal swab specimens and should not be used as a sole basis for treatment. Nasal washings and aspirates are unacceptable for Xpert Xpress SARS-CoV-2/FLU/RSV testing.  Fact Sheet for Patients: BloggerCourse.com  Fact Sheet for Healthcare Providers: SeriousBroker.it  This test is not yet approved or cleared by the [place] FDA and has been authorized for detection and/or diagnosis of SARS-CoV-2 by FDA under an Emergency Use Authorization (EUA). This EUA will remain in effect (meaning this test can be used) for the duration of the COVID-19 declaration under Section 564(b)(1) of the Act, 21 U.S.C. section 360bbb-3(b)(1), unless the authorization is terminated or revoked.     Resp  Syncytial Virus by PCR NEGATIVE NEGATIVE Final    Comment: (NOTE) Fact Sheet for Patients: BloggerCourse.com  Fact Sheet for Healthcare Providers: SeriousBroker.it  This test is not yet approved or cleared by the [place] FDA and has been authorized for detection and/or diagnosis of SARS-CoV-2 by FDA under an Emergency Use Authorization (EUA). This EUA will remain in effect (meaning this test can be used) for the duration of the COVID-19 declaration under Section 564(b)(1) of the Act, 21 U.S.C. section 360bbb-3(b)(1), unless the authorization is terminated or revoked.  Performed at [facility], [address], [place], [place] [address]      Labs: BNP (last 3 results) No results for input(s): "BNP" in the last 8760 hours. Basic Metabolic Panel: Recent Labs  Lab 12/27/22 0837 12/28/22 0258 12/29/22 0600 12/30/22 0605 12/30/22 0606 12/31/22 0552  NA 135 135 133*  --  133* 135  K 3.6 3.2* 3.4*  --  3.3* 3.1*  CL 94* 95* 98  --  97* 100  CO2 20* 22 18*  --  20* 24  GLUCOSE 109* 124* 297*  --  275* 85  BUN 13 13 <5*  --  <5* <5*  CREATININE 1.01* 1.08* 0.90  --  0.85 0.80  CALCIUM 9.5 9.9 8.9  --  8.4* 8.7*  MG  --   --   --  1.8  --   --    Liver Function Tests: Recent Labs  Lab 12/26/22 0415 12/27/22 0837 12/28/22 0258  AST 26 43* 40  ALT 20 26 28   ALKPHOS 68 75 74  BILITOT 0.3 1.2 0.8  PROT 8.0 9.0* 7.9  ALBUMIN 4.5 5.0 4.5   Recent Labs  Lab 12/26/22 0415 12/27/22 0837 12/28/22 0258  LIPASE 22 19 20    No results for input(s): "AMMONIA" in the last 168 hours. CBC: Recent Labs  Lab 12/26/22 0415 12/27/22 0837 12/28/22 0258 12/29/22 0600  WBC 8.3 16.5* 20.0* 10.2  NEUTROABS 6.5  --   --   --   HGB 14.2 14.9 14.6 13.9  HCT 41.2 43.4 41.1 41.2  MCV 91.4 90.2 86.7 92.4  PLT 402* 508* 476* 398   Cardiac Enzymes: No results for input(s): "CKTOTAL", "CKMB", "CKMBINDEX", "TROPONINI" in the  last 168 hours. BNP: Invalid input(s): "POCBNP" CBG: Recent Labs  Lab 12/30/22 1553 12/30/22 2128 12/31/22 0116 12/31/22 0546 12/31/22 0859  GLUCAP 173* 322* 157* 71 341*   D-Dimer No results for input(s): "DDIMER" in the last 72 hours. Hgb A1c No results for input(s): "HGBA1C" in the last 72 hours. Lipid Profile No results for input(s): "CHOL", "HDL", "LDLCALC", "TRIG", "CHOLHDL", "LDLDIRECT" in the last 72 hours. Thyroid function studies No results for input(s): "TSH", "T4TOTAL", "T3FREE", "THYROIDAB" in the last 72 hours.  Invalid input(s): "FREET3" Anemia work up No results for input(s): "VITAMINB12", "FOLATE", "FERRITIN", "TIBC", "IRON", "RETICCTPCT" in the last 72 hours. Urinalysis    Component Value Date/Time   COLORURINE YELLOW 12/27/2022 1125   APPEARANCEUR HAZY (A) 12/27/2022 1125   LABSPEC 1.024 12/27/2022 1125   PHURINE 5.0 12/27/2022 1125   GLUCOSEU >=500 (A) 12/27/2022 1125   GLUCOSEU 250 (A) 09/21/2022 1011   HGBUR NEGATIVE 12/27/2022 1125   BILIRUBINUR NEGATIVE 12/27/2022 1125   BILIRUBINUR Negative 04/12/2022 1110   KETONESUR 80 (A) 12/27/2022 1125   PROTEINUR 100 (A) 12/27/2022 1125   UROBILINOGEN 0.2 09/21/2022 1011   NITRITE NEGATIVE 12/27/2022 1125   LEUKOCYTESUR NEGATIVE 12/27/2022 1125   Sepsis Labs Recent Labs  Lab 12/26/22 0415 12/27/22 0837 12/28/22 0258 12/29/22 0600  WBC 8.3 16.5* 20.0* 10.2   Microbiology Recent Results (from the past 240 hour(s))  Resp panel by RT-PCR (RSV, Flu A&B, Covid) Anterior Nasal Swab     Status: None   Collection Time: 12/28/22  6:22 PM   Specimen: Anterior Nasal Swab  Result Value Ref Range Status   SARS Coronavirus 2 by RT PCR NEGATIVE NEGATIVE Final   Influenza A by PCR NEGATIVE NEGATIVE Final   Influenza B by PCR NEGATIVE NEGATIVE Final    Comment: (NOTE) The Xpert Xpress SARS-CoV-2/FLU/RSV plus assay is intended as an aid in the diagnosis of influenza from Nasopharyngeal swab specimens  and should not be used as a sole basis for treatment. Nasal washings and aspirates are unacceptable for Xpert Xpress SARS-CoV-2/FLU/RSV testing.  Fact Sheet for Patients: BloggerCourse.com  Fact Sheet for Healthcare Providers: SeriousBroker.it  This test is not yet approved or cleared by the [place] FDA and has been authorized for detection and/or diagnosis of SARS-CoV-2 by FDA under an Emergency Use Authorization (EUA). This EUA will remain in effect (meaning this test can be used) for the duration of the COVID-19 declaration under Section 564(b)(1) of the Act, 21 U.S.C. section 360bbb-3(b)(1),  unless the authorization is terminated or revoked.     Resp Syncytial Virus by PCR NEGATIVE NEGATIVE Final    Comment: (NOTE) Fact Sheet for Patients: BloggerCourse.com  Fact Sheet for Healthcare Providers: SeriousBroker.it  This test is not yet approved or cleared by the [place] FDA and has been authorized for detection and/or diagnosis of SARS-CoV-2 by FDA under an Emergency Use Authorization (EUA). This EUA will remain in effect (meaning this test can be used) for the duration of the COVID-19 declaration under Section 564(b)(1) of the Act, 21 U.S.C. section 360bbb-3(b)(1), unless the authorization is terminated or revoked.  Performed at [facility], [address] [place], [place] [address]     Please note: You were cared for by a hospitalist during your hospital stay. Once you are discharged, your primary care physician will handle any further medical issues. Please note that NO REFILLS for any discharge medications will be authorized once you are discharged, as it is imperative that you return to your primary care physician (or establish a relationship with a primary care physician if you do not have one) for your post hospital discharge needs so that they can  reassess your need for medications and monitor your lab values.    Time coordinating discharge: 40 minutes  SIGNED:   Burnadette Pop, MD  [facility] 12/31/2022, 10:53 AM Pager [phone]  If 7PM-7AM, please contact night-coverage [email or web address] Password [email or web address]

## 2023-01-01 ENCOUNTER — Encounter (HOSPITAL_BASED_OUTPATIENT_CLINIC_OR_DEPARTMENT_OTHER): Payer: Self-pay

## 2023-01-01 ENCOUNTER — Ambulatory Visit (HOSPITAL_BASED_OUTPATIENT_CLINIC_OR_DEPARTMENT_OTHER): Payer: Federal, State, Local not specified - PPO | Admitting: *Deleted

## 2023-01-01 VITALS — BP 131/83 | HR 87 | Ht 60.0 in | Wt 137.8 lb

## 2023-01-01 DIAGNOSIS — Z0130 Encounter for examination of blood pressure without abnormal findings: Secondary | ICD-10-CM

## 2023-01-01 NOTE — Progress Notes (Signed)
Pt here for follow up BP check. BP WNL. Denies symptoms. Advised to call with any problems or concerns.

## 2023-01-02 ENCOUNTER — Ambulatory Visit: Payer: Self-pay

## 2023-01-02 NOTE — Patient Outreach (Signed)
  Care Coordination   Follow Up Visit Note   01/02/2023 Name: Judy Cook MRN: [id number] DOB: 2001-02-10  Mizani Illa is a 22 y.o. year old female who sees Elenore Paddy, NP for primary care. I spoke with  Pincus Large by phone today.  What matters to the patients health and wellness today?  12/28/22-12/31/22 admitted with intractable n/v. Ms. Sable Feil reports she is doing better since discharge from hospital. Denies any nausea or vomiting. Continues to check blood sugar-last reading 109. She reports she has all her medications prescribed and has started new prescription. Follow up appointment scheduled with primary provider assisted by care guide scheduled for 01/11/23 at 11:20 am.   Goals Addressed             This Visit's Progress    continue to  improve post hospitalization       Interventions Today    Flowsheet Row Most Recent Value  Chronic Disease   Chronic disease during today's visit Other, Diabetes  [12/28/22-12/31/22 admit intractable N/V, DM type 1]  General Interventions   General Interventions Discussed/Reviewed General Interventions Reviewed, Doctor Visits  Doctor Visits Discussed/Reviewed Doctor Visits Reviewed  Education Interventions   Education Provided Provided Education  Provided Verbal Education On --  [advised continue to take medications as prescribed, continue to check blood sugar and notify provider if questins or concerns, attend provider appointment as scheduled]  Mental Health Interventions   Mental Health Discussed/Reviewed --  [offered LCSW to connect with therapist-patient reports has contact for therapist, she will reach out to her contact and is something that she needs to do herself]  Nutrition Interventions   Nutrition Discussed/Reviewed Nutrition Discussed  [reviewed diet per after visit summary instructions]  Pharmacy Interventions   Pharmacy Dicussed/Reviewed Pharmacy Topics Discussed  [medications reviewed including  new medications Protonix and Potassium]            SDOH assessments and interventions completed:  No  Care Coordination Interventions:  Yes, provided   Follow up plan: Follow up call scheduled for 01/17/23    Encounter Outcome:  Patient Visit Completed   Kathyrn Sheriff, RN, MSN, BSN, CCM Care Management Coordinator [phone]

## 2023-01-02 NOTE — Patient Instructions (Addendum)
Visit Information  Thank you for taking time to visit with me today. Please don't hesitate to contact me if I can be of assistance to you.   Following are the goals we discussed today:  Continue to take medications as prescribed. Continue to attend provider visits as scheduled Continue to eat healthy, lean meats, vegetables, fruits, avoid saturated and transfats Your hospital follow up appointment with Susette Racer, NP is scheduled for 01/11/23 at 11:20 am.  Our next appointment with me is by telephone on 01/17/23 at 11:00 am.  Please call the care guide team at [phone] if you need to cancel or reschedule your appointment.   If you are experiencing a Mental Health or Behavioral Health Crisis or need someone to talk to, please call the Suicide and Crisis Lifeline: 988 call the [place] National Suicide Prevention Lifeline: 812 864 1944 or TTY: 650-191-8219 TTY 4371838423) to talk to a trained counselor call 1-800-273-TALK (toll free, 24 hour hotline)  Kathyrn Sheriff, RN, MSN, BSN, CCM Care Management Coordinator [phone]

## 2023-01-11 ENCOUNTER — Inpatient Hospital Stay: Payer: Federal, State, Local not specified - PPO | Admitting: Family Medicine

## 2023-01-17 ENCOUNTER — Encounter (HOSPITAL_COMMUNITY): Payer: Self-pay

## 2023-01-17 ENCOUNTER — Emergency Department (HOSPITAL_COMMUNITY): Payer: Federal, State, Local not specified - PPO

## 2023-01-17 ENCOUNTER — Emergency Department (HOSPITAL_COMMUNITY)
Admission: EM | Admit: 2023-01-17 | Discharge: 2023-01-17 | Disposition: A | Discharge status: Home / Self Care | Payer: Federal, State, Local not specified - PPO | Attending: Emergency Medicine | Admitting: Emergency Medicine

## 2023-01-17 ENCOUNTER — Other Ambulatory Visit: Payer: Self-pay

## 2023-01-17 DIAGNOSIS — R9431 Abnormal electrocardiogram [ECG] [EKG]: Secondary | ICD-10-CM | POA: Diagnosis not present

## 2023-01-17 DIAGNOSIS — R197 Diarrhea, unspecified: Secondary | ICD-10-CM | POA: Diagnosis not present

## 2023-01-17 DIAGNOSIS — E109 Type 1 diabetes mellitus without complications: Secondary | ICD-10-CM | POA: Insufficient documentation

## 2023-01-17 DIAGNOSIS — R112 Nausea with vomiting, unspecified: Secondary | ICD-10-CM | POA: Insufficient documentation

## 2023-01-17 DIAGNOSIS — R739 Hyperglycemia, unspecified: Secondary | ICD-10-CM | POA: Diagnosis not present

## 2023-01-17 DIAGNOSIS — R1032 Left lower quadrant pain: Secondary | ICD-10-CM | POA: Insufficient documentation

## 2023-01-17 DIAGNOSIS — Z794 Long term (current) use of insulin: Secondary | ICD-10-CM | POA: Insufficient documentation

## 2023-01-17 DIAGNOSIS — R1111 Vomiting without nausea: Secondary | ICD-10-CM | POA: Diagnosis not present

## 2023-01-17 DIAGNOSIS — R1084 Generalized abdominal pain: Secondary | ICD-10-CM | POA: Diagnosis not present

## 2023-01-17 LAB — CBC WITH DIFFERENTIAL/PLATELET
Abs Immature Granulocytes: 0.01 10*3/uL (ref 0.00–0.07)
Basophils Absolute: 0 10*3/uL (ref 0.0–0.1)
Basophils Relative: 0 %
Eosinophils Absolute: 0 10*3/uL (ref 0.0–0.5)
Eosinophils Relative: 0 %
HCT: 42.5 % (ref 36.0–46.0)
Hemoglobin: 14.1 g/dL (ref 12.0–15.0)
Immature Granulocytes: 0 %
Lymphocytes Relative: 18 %
Lymphs Abs: 0.6 10*3/uL — ABNORMAL LOW (ref 0.7–4.0)
MCH: 30.9 pg (ref 26.0–34.0)
MCHC: 33.2 g/dL (ref 30.0–36.0)
MCV: 93 fL (ref 80.0–100.0)
Monocytes Absolute: 0.1 10*3/uL (ref 0.1–1.0)
Monocytes Relative: 3 %
Neutro Abs: 2.5 10*3/uL (ref 1.7–7.7)
Neutrophils Relative %: 79 %
Platelets: 447 10*3/uL — ABNORMAL HIGH (ref 150–400)
RBC: 4.57 MIL/uL (ref 3.87–5.11)
RDW: 12.8 % (ref 11.5–15.5)
WBC: 3.2 10*3/uL — ABNORMAL LOW (ref 4.0–10.5)
nRBC: 0 % (ref 0.0–0.2)

## 2023-01-17 LAB — LIPASE, BLOOD: Lipase: 22 U/L (ref 11–51)

## 2023-01-17 LAB — URINALYSIS, ROUTINE W REFLEX MICROSCOPIC
Bilirubin Urine: NEGATIVE
Glucose, UA: >=500 mg/dL — AB
Ketones, ur: 80 mg/dL — AB
Leukocytes,Ua: NEGATIVE
Nitrite: NEGATIVE
Protein, ur: 30 mg/dL — AB
Specific Gravity, Urine: 1.02 (ref 1.005–1.030)
pH: 8 (ref 5.0–8.0)

## 2023-01-17 LAB — COMPREHENSIVE METABOLIC PANEL
ALT: 21 U/L (ref 0–44)
AST: 25 U/L (ref 15–41)
Albumin: 4.1 g/dL (ref 3.5–5.0)
Alkaline Phosphatase: 76 U/L (ref 38–126)
Anion gap: 12 (ref 5–15)
BUN: 6 mg/dL (ref 6–20)
CO2: 22 mmol/L (ref 22–32)
Calcium: 9.6 mg/dL (ref 8.9–10.3)
Chloride: 100 mmol/L (ref 98–111)
Creatinine, Ser: 0.96 mg/dL (ref 0.44–1.00)
GFR, Estimated: >60 mL/min (ref >60)
Glucose, Bld: 197 mg/dL — ABNORMAL HIGH (ref 70–99)
Potassium: 3.2 mmol/L — ABNORMAL LOW (ref 3.5–5.1)
Sodium: 134 mmol/L — ABNORMAL LOW (ref 135–145)
Total Bilirubin: 0.6 mg/dL (ref 0.3–1.2)
Total Protein: 7.9 g/dL (ref 6.5–8.1)

## 2023-01-17 LAB — HCG, SERUM, QUALITATIVE: Preg, Serum: NEGATIVE

## 2023-01-17 LAB — I-STAT CHEM 8, ED
BUN: 6 mg/dL (ref 6–20)
Calcium, Ion: 1.12 mmol/L — ABNORMAL LOW (ref 1.15–1.40)
Chloride: 104 mmol/L (ref 98–111)
Creatinine, Ser: 0.8 mg/dL (ref 0.44–1.00)
Glucose, Bld: 207 mg/dL — ABNORMAL HIGH (ref 70–99)
HCT: 46 % (ref 36.0–46.0)
Hemoglobin: 15.6 g/dL — ABNORMAL HIGH (ref 12.0–15.0)
Potassium: 3.5 mmol/L (ref 3.5–5.1)
Sodium: 139 mmol/L (ref 135–145)
TCO2: 20 mmol/L — ABNORMAL LOW (ref 22–32)

## 2023-01-17 MED ORDER — KETOROLAC TROMETHAMINE 30 MG/ML IJ SOLN
15.0000 mg | Freq: Once | INTRAMUSCULAR | Status: AC
  Administered 2023-01-17: 15 mg via INTRAVENOUS
  Filled 2023-01-17: qty 1

## 2023-01-17 MED ORDER — SODIUM CHLORIDE 0.9 % IV BOLUS
1000.0000 mL | Freq: Once | INTRAVENOUS | Status: AC
  Administered 2023-01-17: 1000 mL via INTRAVENOUS

## 2023-01-17 MED ORDER — DROPERIDOL 2.5 MG/ML IJ SOLN
2.5000 mg | Freq: Once | INTRAMUSCULAR | Status: AC
  Administered 2023-01-17: 2.5 mg via INTRAVENOUS
  Filled 2023-01-17: qty 2

## 2023-01-17 MED ORDER — IOHEXOL 350 MG/ML SOLN
75.0000 mL | Freq: Once | INTRAVENOUS | Status: AC | PRN
  Administered 2023-01-17: 75 mL via INTRAVENOUS

## 2023-01-17 MED ORDER — MORPHINE SULFATE (PF) 4 MG/ML IV SOLN
4.0000 mg | Freq: Once | INTRAVENOUS | Status: AC
  Administered 2023-01-17: 4 mg via INTRAVENOUS
  Filled 2023-01-17: qty 1

## 2023-01-17 NOTE — ED Provider Notes (Signed)
  Physical Exam  BP (!) 144/93   Pulse 89   Temp 97.7 F (36.5 C) (Axillary)   Resp (!) 21   SpO2 100%   Physical Exam Vitals and nursing note reviewed.  Constitutional:      General: She is not in acute distress.    Appearance: Normal appearance. She is normal weight. She is not ill-appearing.  HENT:     Head: Normocephalic and atraumatic.  Pulmonary:     Effort: Pulmonary effort is normal. No respiratory distress.  Abdominal:     General: Abdomen is flat.  Musculoskeletal:        General: Normal range of motion.     Cervical back: Neck supple.  Skin:    General: Skin is warm and dry.  Neurological:     Mental Status: She is alert and oriented to person, place, and time.  Psychiatric:        Mood and Affect: Mood normal.        Behavior: Behavior normal.     Procedures  Procedures  ED Course / MDM   Clinical Course as of 01/17/23 0939  Wed Jan 17, 2023  0927 Patient resting comfortably, sleeping. Reassuring workup. Will attempt PO challenge [AS]    Clinical Course User Index [AS] Vernis Cabacungan, Edsel Petrin, PA-C   Medical Decision Making Amount and/or Complexity of Data Reviewed Labs: ordered. Radiology: ordered.  Risk Prescription drug management.   Assumed care at shift change from previous provider.  Please see previous note for full HPI.  In short, 22 year old female with numerous ED visits for similar presentations.  Presenting today with left lower quadrant abdominal pain, nausea, vomiting, diarrhea.  Symptoms thought to be secondary to gastroparesis.  Plan at the time of shift change is pending laboratory workup and CT abdomen/pelvis.  2100-CT abdomen pelvis negative.  Lab workup with mild hyponatremia and hypokalemia.  This was treated in the ED.  No leukocytosis.  Urine does not appear infected.  CT abdomen pelvis negative.  Overall do suspect an episode of gastroparesis.  She follows with endocrinology and GI.  She was encouraged to schedule a follow-up  appointment with both providers for her symptoms.  She passed her p.o. challenge in the ED.  Reports her pain is resolved.  She was given return precautions.  Stable at discharge.  At this time there does not appear to be any evidence of an acute emergency medical condition and the patient appears stable for discharge with appropriate outpatient follow up. Diagnosis was discussed with patient who verbalizes understanding of care plan and is agreeable to discharge. I have discussed return precautions with patient who verbalizes understanding. Patient encouraged to follow-up with their PCP within 1 week. All questions answered.  Note: Portions of this report may have been transcribed using voice recognition software. Every effort was made to ensure accuracy; however, inadvertent computerized transcription errors may still be present.   Michelle Piper, Cordelia Poche 01/17/23 4098    Rondel Baton, MD 01/19/23 1520

## 2023-01-17 NOTE — Discharge Instructions (Addendum)
You have been seen today for your complaint of abdominal pain. Your lab work was reassuring. Your imaging was reassuring. Follow up with: Your PCP in 1 week for reevaluation Please seek immediate medical care if you develop any of the following symptoms: You cannot stop vomiting. Your pain is only in one part of your belly, like on the right side. You have bloody or black poop, or poop that looks like tar. You have trouble breathing. You have chest pain. At this time there does not appear to be the presence of an emergent medical condition, however there is always the potential for conditions to change. Please read and follow the below instructions.  Do not take your medicine if  develop an itchy rash, swelling in your mouth or lips, or difficulty breathing; call 911 and seek immediate emergency medical attention if this occurs.  You may review your lab tests and imaging results in their entirety on your MyChart account.  Please discuss all results of fully with your primary care provider and other specialist at your follow-up visit.  Note: Portions of this text may have been transcribed using voice recognition software. Every effort was made to ensure accuracy; however, inadvertent computerized transcription errors may still be present.

## 2023-01-17 NOTE — ED Triage Notes (Signed)
Histroy of gastroparesis and diabetes, woke up with sudden onset n/v/d abdominal pain.

## 2023-01-17 NOTE — ED Provider Notes (Signed)
[place] EMERGENCY DEPARTMENT AT [facility] Provider Note   CSN: [id number] Arrival date & time: 01/17/23  0522     History  Chief Complaint  Patient presents with   Nausea   Emesis   Abdominal Pain    Judy Cook is a 22 y.o. female.  Patient with past medical history significant for type I DM, marijuana use, cyclical vomiting, gastroparesis, diabetic ketoacidosis presents to the emergency department complaining of left lower quadrant abdominal pain, nausea, vomiting, and diarrhea which began at approximately midnight.  She states her symptoms feel similar to previous episodes of gastroparesis.  EMS administered 4 mg of Zofran which did help with the nausea.  She continues to complain of abdominal pain at this time.  She denies any medication changes, any new foods or dietary changes.  Patient denies chest pain, shortness of breath, blood in stools, fevers.    Emesis Associated symptoms: abdominal pain   Abdominal Pain Associated symptoms: vomiting        Home Medications Prior to Admission medications   Medication Sig Start Date End Date Taking? Authorizing Provider  Continuous Glucose Sensor (DEXCOM G7 SENSOR) MISC Change every 10 days 11/27/22   Shamleffer, Konrad Dolores, MD  ibuprofen (ADVIL) 200 MG tablet Take 200 mg by mouth as needed for moderate pain.    [provider]  insulin aspart (NOVOLOG) 100 UNIT/ML injection Max Daily 60 units per pump Patient taking differently: Inject 10 Units into the skin 3 (three) times daily with meals. Via pump Pump is also injecting per sliding scale per pt 06/08/22   Shamleffer, Konrad Dolores, MD  linaclotide [facility]) 72 MCG capsule Take 1 capsule (72 mcg total) by mouth daily before breakfast. 12/21/22 06/19/23  Celso Amy, PA-C  metoCLOPramide (REGLAN) 5 MG tablet Take 1 tablet (5 mg total) by mouth 3 (three) times daily. 12/21/22 06/19/23  Celso Amy, PA-C  Multiple Vitamins-Minerals (MULTIVITAMIN  WITH MINERALS) tablet Take 2 tablets by mouth daily.    [provider]  naproxen sodium (ALEVE) 220 MG tablet Take 440 mg by mouth daily as needed (pain).    [provider]  norethindrone (MICRONOR) 0.35 MG tablet Take 0.35 mg by mouth daily. 12/18/22   [provider]  ondansetron (ZOFRAN) 4 MG tablet Take 1 tablet (4 mg total) by mouth every 8 (eight) hours as needed for nausea or vomiting. 12/21/22 06/19/23  Celso Amy, PA-C  pantoprazole (PROTONIX) 40 MG tablet Take 1 tablet (40 mg total) by mouth 2 (two) times daily. 12/31/22 01/30/23  Burnadette Pop, MD  potassium chloride SA (KLOR-CON M) 20 MEQ tablet Take 2 tablets (40 mEq total) by mouth daily for 3 days. 01/01/23 01/04/23  Burnadette Pop, MD      Allergies    Patient has no known allergies.    Review of Systems   Review of Systems  Gastrointestinal:  Positive for abdominal pain and vomiting.    Physical Exam Updated Vital Signs BP (!) 131/95   Pulse 86   Temp 97.7 F (36.5 C) (Axillary)   Resp 20   SpO2 100%  Physical Exam Vitals and nursing note reviewed.  Constitutional:      General: She is not in acute distress.    Appearance: She is well-developed.  HENT:     Head: Normocephalic and atraumatic.  Eyes:     Conjunctiva/sclera: Conjunctivae normal.  Cardiovascular:     Rate and Rhythm: Normal rate and regular rhythm.     Heart sounds: No  murmur heard. Pulmonary:     Effort: Pulmonary effort is normal. No respiratory distress.     Breath sounds: Normal breath sounds.  Abdominal:     Palpations: Abdomen is soft.     Tenderness: There is abdominal tenderness in the left lower quadrant.  Musculoskeletal:        General: No swelling.     Cervical back: Neck supple.  Skin:    General: Skin is warm and dry.     Capillary Refill: Capillary refill takes less than 2 seconds.  Neurological:     Mental Status: She is alert.  Psychiatric:        Mood and Affect: Mood normal.     ED  Results / Procedures / Treatments   Labs (all labs ordered are listed, but only abnormal results are displayed) Labs Reviewed  URINALYSIS, ROUTINE W REFLEX MICROSCOPIC - Abnormal; Notable for the following components:      Result Value   APPearance HAZY (*)    Glucose, UA >=500 (*)    Hgb urine dipstick MODERATE (*)    Ketones, ur 80 (*)    Protein, ur 30 (*)    Bacteria, UA RARE (*)    All other components within normal limits  CBC WITH DIFFERENTIAL/PLATELET  COMPREHENSIVE METABOLIC PANEL  LIPASE, BLOOD  HCG, SERUM, QUALITATIVE  I-STAT CHEM 8, ED    EKG None  Radiology No results found.  Procedures Procedures    Medications Ordered in ED Medications  droperidol (INAPSINE) 2.5 MG/ML injection 2.5 mg (2.5 mg Intravenous Given 01/17/23 0550)    ED Course/ Medical Decision Making/ A&P                                 Medical Decision Making Amount and/or Complexity of Data Reviewed Labs: ordered. Radiology: ordered.  Risk Prescription drug management.   This patient presents to the ED for concern of abdominal pain, this involves an extensive number of treatment options, and is a complaint that carries with it a high risk of complications and morbidity.  The differential diagnosis includes gastroparesis, appendicitis, cholecystitis, colitis, diverticulitis, others   Co morbidities that complicate the patient evaluation  History of type I DM, gastroparesis, hyperemesis   Additional history obtained:  Additional history obtained from EMS External records from outside source obtained and reviewed including recent discharge summary   Lab Tests:  I Ordered, and personally interpreted labs.  The pertinent results include: UA with moderate hemoglobin (patient currently on menstrual period), glucose, ketones, protein, rare bacteria.  Qualitative hCG, CMP, lipase, CBC, and i-STAT Chem-8 are all pending at this time   Imaging Studies ordered:  I ordered imaging  studies including CT abdomen pelvis with contrast Imaging pending at time of patient's signout   Cardiac Monitoring: / EKG:  The patient was maintained on a cardiac monitor.  I personally viewed and interpreted the cardiac monitored which showed an underlying rhythm of: Sinus rhythm   Problem List / ED Course / Critical interventions / Medication management   I ordered medication including droperidol for abdominal pain Reevaluation of the patient after these medicines showed that the patient improved I have reviewed the patients home medicines and have made adjustments as needed   Test / Admission - Considered:  Patient care being transferred to [facility], PA-C at shift handoff.  Disposition pending results of lab work and imaging along with response to medication.  Final Clinical Impression(s) / ED Diagnoses Final diagnoses:  None    Rx / DC Orders ED Discharge Orders     None         Pamala Duffel 01/17/23 6962    Glynn Octave, MD 01/17/23 [phone]

## 2023-01-18 ENCOUNTER — Inpatient Hospital Stay: Payer: Federal, State, Local not specified - PPO | Admitting: Nurse Practitioner

## 2023-01-18 ENCOUNTER — Encounter (HOSPITAL_COMMUNITY): Payer: Self-pay

## 2023-01-18 ENCOUNTER — Emergency Department (HOSPITAL_COMMUNITY)
Admission: EM | Admit: 2023-01-18 | Discharge: 2023-01-18 | Disposition: A | Discharge status: Home / Self Care | Payer: Federal, State, Local not specified - PPO | Source: Home / Self Care | Attending: Emergency Medicine | Admitting: Emergency Medicine

## 2023-01-18 ENCOUNTER — Other Ambulatory Visit: Payer: Self-pay

## 2023-01-18 ENCOUNTER — Inpatient Hospital Stay: Payer: Federal, State, Local not specified - PPO | Admitting: Family Medicine

## 2023-01-18 ENCOUNTER — Emergency Department (HOSPITAL_COMMUNITY)
Admission: EM | Admit: 2023-01-18 | Discharge: 2023-01-18 | Disposition: A | Discharge status: Home / Self Care | Payer: Federal, State, Local not specified - PPO | Attending: Emergency Medicine | Admitting: Emergency Medicine

## 2023-01-18 DIAGNOSIS — I10 Essential (primary) hypertension: Secondary | ICD-10-CM | POA: Diagnosis not present

## 2023-01-18 DIAGNOSIS — Z794 Long term (current) use of insulin: Secondary | ICD-10-CM | POA: Insufficient documentation

## 2023-01-18 DIAGNOSIS — R1084 Generalized abdominal pain: Secondary | ICD-10-CM | POA: Diagnosis not present

## 2023-01-18 DIAGNOSIS — R1110 Vomiting, unspecified: Secondary | ICD-10-CM | POA: Diagnosis not present

## 2023-01-18 DIAGNOSIS — E1143 Type 2 diabetes mellitus with diabetic autonomic (poly)neuropathy: Secondary | ICD-10-CM | POA: Insufficient documentation

## 2023-01-18 DIAGNOSIS — R1032 Left lower quadrant pain: Secondary | ICD-10-CM | POA: Diagnosis not present

## 2023-01-18 DIAGNOSIS — Z79899 Other long term (current) drug therapy: Secondary | ICD-10-CM | POA: Insufficient documentation

## 2023-01-18 DIAGNOSIS — R110 Nausea: Secondary | ICD-10-CM | POA: Insufficient documentation

## 2023-01-18 DIAGNOSIS — R1111 Vomiting without nausea: Secondary | ICD-10-CM | POA: Diagnosis not present

## 2023-01-18 DIAGNOSIS — F1910 Other psychoactive substance abuse, uncomplicated: Secondary | ICD-10-CM | POA: Diagnosis not present

## 2023-01-18 DIAGNOSIS — F12188 Cannabis abuse with other cannabis-induced disorder: Secondary | ICD-10-CM | POA: Diagnosis not present

## 2023-01-18 DIAGNOSIS — R112 Nausea with vomiting, unspecified: Secondary | ICD-10-CM | POA: Diagnosis not present

## 2023-01-18 LAB — BASIC METABOLIC PANEL
Anion gap: 15 (ref 5–15)
Anion gap: 19 — ABNORMAL HIGH (ref 5–15)
BUN: 10 mg/dL (ref 6–20)
BUN: 8 mg/dL (ref 6–20)
CO2: 17 mmol/L — ABNORMAL LOW (ref 22–32)
CO2: 17 mmol/L — ABNORMAL LOW (ref 22–32)
Calcium: 8.4 mg/dL — ABNORMAL LOW (ref 8.9–10.3)
Calcium: 8.8 mg/dL — ABNORMAL LOW (ref 8.9–10.3)
Chloride: 100 mmol/L (ref 98–111)
Chloride: 97 mmol/L — ABNORMAL LOW (ref 98–111)
Creatinine, Ser: 0.98 mg/dL (ref 0.44–1.00)
Creatinine, Ser: 0.96 mg/dL (ref 0.44–1.00)
GFR, Estimated: >60 mL/min (ref >60)
GFR, Estimated: >60 mL/min (ref >60)
Glucose, Bld: 377 mg/dL — ABNORMAL HIGH (ref 70–99)
Glucose, Bld: 174 mg/dL — ABNORMAL HIGH (ref 70–99)
Potassium: 3.6 mmol/L (ref 3.5–5.1)
Potassium: 3.3 mmol/L — ABNORMAL LOW (ref 3.5–5.1)
Sodium: 132 mmol/L — ABNORMAL LOW (ref 135–145)
Sodium: 133 mmol/L — ABNORMAL LOW (ref 135–145)

## 2023-01-18 LAB — CBC WITH DIFFERENTIAL/PLATELET
Abs Immature Granulocytes: 0.05 10*3/uL (ref 0.00–0.07)
Abs Immature Granulocytes: 0.05 10*3/uL (ref 0.00–0.07)
Basophils Absolute: 0 10*3/uL (ref 0.0–0.1)
Basophils Absolute: 0 10*3/uL (ref 0.0–0.1)
Basophils Relative: 0 %
Basophils Relative: 0 %
Eosinophils Absolute: 0.1 10*3/uL (ref 0.0–0.5)
Eosinophils Absolute: 0 10*3/uL (ref 0.0–0.5)
Eosinophils Relative: 1 %
Eosinophils Relative: 0 %
HCT: 41.4 % (ref 36.0–46.0)
HCT: 41.6 % (ref 36.0–46.0)
Hemoglobin: 14.1 g/dL (ref 12.0–15.0)
Hemoglobin: 14.4 g/dL (ref 12.0–15.0)
Immature Granulocytes: 1 %
Immature Granulocytes: 1 %
Lymphocytes Relative: 9 %
Lymphocytes Relative: 9 %
Lymphs Abs: 0.9 10*3/uL (ref 0.7–4.0)
Lymphs Abs: 0.9 10*3/uL (ref 0.7–4.0)
MCH: 30.5 pg (ref 26.0–34.0)
MCH: 32.2 pg (ref 26.0–34.0)
MCHC: 34.1 g/dL (ref 30.0–36.0)
MCHC: 34.6 g/dL (ref 30.0–36.0)
MCV: 89.4 fL (ref 80.0–100.0)
MCV: 93.1 fL (ref 80.0–100.0)
Monocytes Absolute: 0.5 10*3/uL (ref 0.1–1.0)
Monocytes Absolute: 0.4 10*3/uL (ref 0.1–1.0)
Monocytes Relative: 5 %
Monocytes Relative: 3 %
Neutro Abs: 8 10*3/uL — ABNORMAL HIGH (ref 1.7–7.7)
Neutro Abs: 9 10*3/uL — ABNORMAL HIGH (ref 1.7–7.7)
Neutrophils Relative %: 84 %
Neutrophils Relative %: 87 %
Platelets: 145 10*3/uL — ABNORMAL LOW (ref 150–400)
Platelets: 473 10*3/uL — ABNORMAL HIGH (ref 150–400)
RBC: 4.63 MIL/uL (ref 3.87–5.11)
RBC: 4.47 MIL/uL (ref 3.87–5.11)
RDW: 12.7 % (ref 11.5–15.5)
RDW: 13 % (ref 11.5–15.5)
WBC: 9.4 10*3/uL (ref 4.0–10.5)
WBC: 10.3 10*3/uL (ref 4.0–10.5)
nRBC: 0 % (ref 0.0–0.2)
nRBC: 0 % (ref 0.0–0.2)

## 2023-01-18 LAB — COMPREHENSIVE METABOLIC PANEL
ALT: 21 U/L (ref 0–44)
AST: 24 U/L (ref 15–41)
Albumin: 4.4 g/dL (ref 3.5–5.0)
Alkaline Phosphatase: 77 U/L (ref 38–126)
Anion gap: 20 — ABNORMAL HIGH (ref 5–15)
BUN: 12 mg/dL (ref 6–20)
CO2: 18 mmol/L — ABNORMAL LOW (ref 22–32)
Calcium: 9.8 mg/dL (ref 8.9–10.3)
Chloride: 94 mmol/L — ABNORMAL LOW (ref 98–111)
Creatinine, Ser: 1.1 mg/dL — ABNORMAL HIGH (ref 0.44–1.00)
GFR, Estimated: >60 mL/min (ref >60)
Glucose, Bld: 375 mg/dL — ABNORMAL HIGH (ref 70–99)
Potassium: 3.3 mmol/L — ABNORMAL LOW (ref 3.5–5.1)
Sodium: 132 mmol/L — ABNORMAL LOW (ref 135–145)
Total Bilirubin: 1 mg/dL (ref 0.3–1.2)
Total Protein: 7.9 g/dL (ref 6.5–8.1)

## 2023-01-18 LAB — RAPID URINE DRUG SCREEN, HOSP PERFORMED
Amphetamines: NOT DETECTED
Barbiturates: NOT DETECTED
Benzodiazepines: NOT DETECTED
Cocaine: POSITIVE — AB
Opiates: POSITIVE — AB
Tetrahydrocannabinol: POSITIVE — AB

## 2023-01-18 LAB — LIPASE, BLOOD: Lipase: 18 U/L (ref 11–51)

## 2023-01-18 LAB — CBG MONITORING, ED
Glucose-Capillary: 324 mg/dL — ABNORMAL HIGH (ref 70–99)
Glucose-Capillary: 172 mg/dL — ABNORMAL HIGH (ref 70–99)

## 2023-01-18 MED ORDER — LACTATED RINGERS IV BOLUS
1000.0000 mL | Freq: Once | INTRAVENOUS | Status: AC
  Administered 2023-01-18: 1000 mL via INTRAVENOUS

## 2023-01-18 MED ORDER — HALOPERIDOL LACTATE 5 MG/ML IJ SOLN
2.0000 mg | Freq: Once | INTRAMUSCULAR | Status: AC
  Administered 2023-01-18: 2 mg via INTRAVENOUS
  Filled 2023-01-18: qty 1

## 2023-01-18 MED ORDER — DIPHENHYDRAMINE HCL 50 MG/ML IJ SOLN
12.5000 mg | Freq: Once | INTRAMUSCULAR | Status: AC
  Administered 2023-01-18: 12.5 mg via INTRAVENOUS
  Filled 2023-01-18: qty 1

## 2023-01-18 MED ORDER — SODIUM CHLORIDE 0.9 % IV BOLUS
1000.0000 mL | Freq: Once | INTRAVENOUS | Status: AC
  Administered 2023-01-18: 1000 mL via INTRAVENOUS

## 2023-01-18 MED ORDER — PROMETHAZINE HCL 25 MG RE SUPP: 25.0000 mg | Freq: Three times a day (TID) | RECTAL | 0 refills | Status: DC | PRN

## 2023-01-18 NOTE — ED Notes (Signed)
Pt given a cup of water to sip on to attempt PO challenge. Pt does tell me she feels much better at this time.

## 2023-01-18 NOTE — ED Notes (Signed)
This RN attempted a line in both antecubital fossa with no success.

## 2023-01-18 NOTE — ED Provider Notes (Signed)
[place] EMERGENCY DEPARTMENT AT [facility] Provider Note   CSN: [id number] Arrival date & time: 01/18/23  0247     History  Chief Complaint  Patient presents with   Abdominal Pain    Betsie Heimberger is a 22 y.o. female.  22 year old female brought in by EMS with vomiting onset around midnight.  Past medical history of diabetes, gastroparesis, cyclic vomiting, marijuana use with last use 2 to 3 days ago.  Emesis noted to be nonbloody, nonbilious.  Patient was seen for same in ER 24 hours ago, provided with medications and discharged tolerating p.o.'s.  She has tried Zofran ODT at home without improvement in symptoms.       Home Medications Prior to Admission medications   Medication Sig Start Date End Date Taking? Authorizing Provider  Continuous Glucose Sensor (DEXCOM G7 SENSOR) MISC Change every 10 days 11/27/22   Shamleffer, Konrad Dolores, MD  ibuprofen (ADVIL) 200 MG tablet Take 200 mg by mouth as needed for moderate pain.    [provider]  insulin aspart (NOVOLOG) 100 UNIT/ML injection Max Daily 60 units per pump Patient taking differently: Inject 10 Units into the skin 3 (three) times daily with meals. Via pump Pump is also injecting per sliding scale per pt 06/08/22   Shamleffer, Konrad Dolores, MD  linaclotide [facility]) 72 MCG capsule Take 1 capsule (72 mcg total) by mouth daily before breakfast. 12/21/22 06/19/23  Celso Amy, PA-C  metoCLOPramide (REGLAN) 5 MG tablet Take 1 tablet (5 mg total) by mouth 3 (three) times daily. 12/21/22 06/19/23  Celso Amy, PA-C  Multiple Vitamins-Minerals (MULTIVITAMIN WITH MINERALS) tablet Take 2 tablets by mouth daily.    [provider]  naproxen sodium (ALEVE) 220 MG tablet Take 440 mg by mouth daily as needed (pain).    [provider]  norethindrone (MICRONOR) 0.35 MG tablet Take 0.35 mg by mouth daily. 12/18/22   [provider]  ondansetron (ZOFRAN) 4 MG tablet Take 1 tablet  (4 mg total) by mouth every 8 (eight) hours as needed for nausea or vomiting. 12/21/22 06/19/23  Celso Amy, PA-C  pantoprazole (PROTONIX) 40 MG tablet Take 1 tablet (40 mg total) by mouth 2 (two) times daily. 12/31/22 01/30/23  Burnadette Pop, MD  potassium chloride SA (KLOR-CON M) 20 MEQ tablet Take 2 tablets (40 mEq total) by mouth daily for 3 days. 01/01/23 01/04/23  Burnadette Pop, MD      Allergies    Patient has no known allergies.    Review of Systems   Review of Systems Negative except as per HPI Physical Exam Updated Vital Signs BP 134/76 (BP Location: Left Arm)   Pulse (!) 107   Temp 97.9 F (36.6 C) (Oral)   Resp 18   LMP 01/16/2023 (Exact Date)   SpO2 99%  Physical Exam Vitals and nursing note reviewed.  Constitutional:      General: She is not in acute distress.    Appearance: She is well-developed. She is not diaphoretic.  HENT:     Head: Normocephalic and atraumatic.  Cardiovascular:     Rate and Rhythm: Normal rate and regular rhythm.     Heart sounds: Normal heart sounds.  Pulmonary:     Effort: Pulmonary effort is normal.     Breath sounds: Normal breath sounds.  Abdominal:     Palpations: Abdomen is soft.     Tenderness: There is no abdominal tenderness.  Skin:    General: Skin is warm and dry.  Findings: No erythema or rash.  Neurological:     Mental Status: She is alert and oriented to person, place, and time.  Psychiatric:        Behavior: Behavior normal.     ED Results / Procedures / Treatments   Labs (all labs ordered are listed, but only abnormal results are displayed) Labs Reviewed  COMPREHENSIVE METABOLIC PANEL - Abnormal; Notable for the following components:      Result Value   Sodium 132 (*)    Potassium 3.3 (*)    Chloride 94 (*)    CO2 18 (*)    Glucose, Bld 375 (*)    Creatinine, Ser 1.10 (*)    Anion gap 20 (*)    All other components within normal limits  CBC WITH DIFFERENTIAL/PLATELET - Abnormal; Notable for the  following components:   Platelets 145 (*)    Neutro Abs 8.0 (*)    All other components within normal limits  RAPID URINE DRUG SCREEN, HOSP PERFORMED - Abnormal; Notable for the following components:   Opiates POSITIVE (*)    Cocaine POSITIVE (*)    Tetrahydrocannabinol POSITIVE (*)    All other components within normal limits  CBG MONITORING, ED - Abnormal; Notable for the following components:   Glucose-Capillary 324 (*)    All other components within normal limits  LIPASE, BLOOD  URINALYSIS, ROUTINE W REFLEX MICROSCOPIC  BASIC METABOLIC PANEL    EKG None  Radiology CT ABDOMEN PELVIS W CONTRAST  Result Date: 01/17/2023 CLINICAL DATA:  Left lower quadrant pain, nausea, emesis EXAM: CT ABDOMEN AND PELVIS WITH CONTRAST TECHNIQUE: Multidetector CT imaging of the abdomen and pelvis was performed using the standard protocol following bolus administration of intravenous contrast. RADIATION DOSE REDUCTION: This exam was performed according to the departmental dose-optimization program which includes automated exposure control, adjustment of the mA and/or kV according to patient size and/or use of iterative reconstruction technique. CONTRAST:  75mL OMNIPAQUE IOHEXOL 350 MG/ML SOLN COMPARISON:  12/28/2022 FINDINGS: Lower chest: Slight improvement in the distal esophageal wall thickening at the GE junction when compared to 12/28/2022. Normal heart size. No pericardial or pleural effusion. Clear lung bases. Healing subacute/old left inferior rib fracture. Hepatobiliary: No focal liver abnormality is seen. No gallstones, gallbladder wall thickening, or biliary dilatation. Pancreas: Unremarkable. No pancreatic ductal dilatation or surrounding inflammatory changes. Spleen: Normal in size without focal abnormality. Adrenals/Urinary Tract: Adrenal glands are unremarkable. Kidneys are normal, without renal calculi, focal lesion, or hydronephrosis. Bladder is unremarkable. Stomach/Bowel: Stomach is within  normal limits. Appendix appears normal. No evidence of bowel wall thickening, distention, or inflammatory changes. Vascular/Lymphatic: No significant vascular findings are present. No enlarged abdominal or pelvic lymph nodes. Reproductive: Uterus and bilateral adnexa are unremarkable. Other: No abdominal wall hernia or abnormality. No abdominopelvic ascites. Musculoskeletal: No acute or significant osseous findings. IMPRESSION: 1. No acute findings within the abdomen or pelvis. No findings to account for left lower quadrant abdominal pain. 2. Slight improvement in the distal esophageal wall thickening when compared to 12/28/2022. Electronically Signed   By: Judie Petit.  Shick M.D.   On: 01/17/2023 08:17    Procedures Procedures    Medications Ordered in ED Medications  sodium chloride 0.9 % bolus 1,000 mL (0 mLs Intravenous Stopped 01/18/23 0435)  haloperidol lactate (HALDOL) injection 2 mg (2 mg Intravenous Given 01/18/23 0333)  diphenhydrAMINE (BENADRYL) injection 12.5 mg (12.5 mg Intravenous Given 01/18/23 0333)  sodium chloride 0.9 % bolus 1,000 mL (1,000 mLs Intravenous New Bag/Given 01/18/23 0452)  ED Course/ Medical Decision Making/ A&P                                 Medical Decision Making Amount and/or Complexity of Data Reviewed Labs: ordered.  Risk Prescription drug management.   This patient presents to the ED for concern of vomiting, this involves an extensive number of treatment options, and is a complaint that carries with it a high risk of complications and morbidity.  The differential diagnosis includes but not limited to gastroparesis, cannabinoid hyperemesis, DKA, electrolyte disturbance     Co morbidities that complicate the patient evaluation  Diabetes, gastroparesis, GERD   Additional history obtained:  Additional history obtained from EMS as above External records from outside source obtained and reviewed including labs on file for comparison. Seen here yesterday,  hcg negative    Lab Tests:  I Ordered, and personally interpreted labs.  The pertinent results include: UDS is positive for opiates, cocaine, marijuana.  Lipase is normal.  CBC without significant findings.  CBG with elevated glucose at 324.  CMP with glucose of 375, bicarb of 18 and gap of 20.   Problem List / ED Course / Critical interventions / Medication management  22 year old female presents via EMS with intractable vomiting, unrelieved with Zofran at home.  On arrival, emesis is clear.  Her abdomen is soft and nontender.  Patient was seen in this ER for same 24 hours ago and discharged after tolerating p.o.'s.  History of same, also admits marijuana use, also positive for benzos and cocaine.  Patient is provided with Haldol, Benadryl, IV fluids.  Found to have slight metabolic acidosis, plan is to hydrate and recheck BMP.  She is tolerating p.o.'s. I ordered medication including IVF, haldol, benadryl  for vomiting, hydration   Reevaluation of the patient after these medicines showed that the patient resolved I have reviewed the patients home medicines and have made adjustments as needed   Social Determinants of Health:  Has PCP   Test / Admission - Considered:  Disposition pending at time of sign out to oncoming provider pending repeat BMP         Final Clinical Impression(s) / ED Diagnoses Final diagnoses:  Cannabinoid hyperemesis syndrome  Polysubstance abuse [facility])    Rx / DC Orders ED Discharge Orders     None         Jeannie Fend, PA-C 01/18/23 0610    Dione Booze, MD 01/18/23 [phone]

## 2023-01-18 NOTE — ED Triage Notes (Signed)
Pt here with NV. Seen for same this morning. Did not pick up medications for nausea

## 2023-01-18 NOTE — ED Triage Notes (Signed)
Pt BIB [facility] for lower abdominal pain and vomiting. Pt seen for same yesterday. Pt has history of gastroparesis

## 2023-01-18 NOTE — Discharge Instructions (Signed)
You have been seen today for your complaint of nausea, vomiting and abdominal pain. Your lab work was reassuring after treatment. Your discharge medications include phenergan. This is a nausea medicine. Take it as needed for nausea and vomiting at home. Home care instructions are as follows:  Avoid using marijuana. Follow up with: your PCP in one week for reevaluation Please seek immediate medical care if you develop any of the following symptoms: You have pain in your chest, neck, arm, or jaw. Your heart is beating very quickly. You have trouble breathing or you are breathing very quickly. You feel extremely weak or you faint. Your skin feels cold and clammy. You feel confused. You have persistent vomiting. You have vomit that is bright red or looks like black coffee grounds. You have stools (feces) that are bloody or black, or stools that look like tar. You have a severe headache, a stiff neck, or both. You have severe pain, cramping, or bloating in your abdomen. You have signs of dehydration, such as: Dark urine, very little urine, or no urine. Cracked lips. Dry mouth. Sunken eyes. Sleepiness. Weakness. At this time there does not appear to be the presence of an emergent medical condition, however there is always the potential for conditions to change. Please read and follow the below instructions.  Do not take your medicine if  develop an itchy rash, swelling in your mouth or lips, or difficulty breathing; call 911 and seek immediate emergency medical attention if this occurs.  You may review your lab tests and imaging results in their entirety on your MyChart account.  Please discuss all results of fully with your primary care provider and other specialist at your follow-up visit.  Note: Portions of this text may have been transcribed using voice recognition software. Every effort was made to ensure accuracy; however, inadvertent computerized transcription errors may still be  present.

## 2023-01-18 NOTE — ED Provider Notes (Signed)
8:03 PM Patient seen in conjunction with Jalene Mullet.  Patient seen several times recently including discharge this morning, for intractable vomiting.  She appears comfortable, sleeping during stay..  Vomiting appears to be in part to self induced.  Labs look reasonable and consistent with previous.  Bicarb was slightly low at 17.  Patient received couple liters of IV fluids.  Appears clinically stable.  She can be discharged home.  BP (!) 161/88 (BP Location: Right Arm)   Pulse 88   Temp 98.6 F (37 C) (Oral)   Resp 16   Ht 5' (1.524 m)   Wt 62.5 kg   LMP 01/16/2023 (Exact Date)   SpO2 96%   BMI 26.91 kg/m     Renne Crigler, Cordelia Poche 01/18/23 2004    Rondel Baton, MD 01/19/23 [phone]

## 2023-01-18 NOTE — ED Provider Notes (Signed)
[place] EMERGENCY DEPARTMENT AT [facility] Provider Note   CSN: [id number] Arrival date & time: 01/18/23  1455     History  Chief Complaint  Patient presents with   Nausea    Phung Kotas is a 22 y.o. female with a past medical history significant for diabetes, gastroparesis, cyclic vomiting, polysubstance abuse who presents for abdominal pain/nausea/vomiting.  Patient has been seen numerous times in the ER in the past 2 days for similar symptoms.  She states that she took Zofran at home prior to arrival without improvement in symptoms.  Emesis nonbloody, nonbilious.  When asked how many times patient has vomited she states "a lot of vomit ".  She endorses generalized abdominal pain, headache, and chills.  She denies diarrhea, fever, throat pain, weakness, shortness of breath, chest pain.  She denies her glucose monitoring having any highs or lows today.  Was most recently seen today around midnight where her metabolic panel showed an anion gap which was corrected after 2 L normal saline.  She also had a CT abdomen pelvis with contrast yesterday which showed no acute findings and some improvement in distal esophageal wall thickening when compared to 12/28/2022 imaging.  Patient was prescribed promethazine this morning which she has not picked up yet.   HPI     Home Medications Prior to Admission medications   Medication Sig Start Date End Date Taking? Authorizing Provider  Continuous Glucose Sensor (DEXCOM G7 SENSOR) MISC Change every 10 days 11/27/22   Shamleffer, Konrad Dolores, MD  ibuprofen (ADVIL) 200 MG tablet Take 200 mg by mouth as needed for moderate pain.    [provider]  insulin aspart (NOVOLOG) 100 UNIT/ML injection Max Daily 60 units per pump Patient taking differently: Inject 10 Units into the skin 3 (three) times daily with meals. Via pump Pump is also injecting per sliding scale per pt 06/08/22   Shamleffer, Konrad Dolores, MD   linaclotide [facility]) 72 MCG capsule Take 1 capsule (72 mcg total) by mouth daily before breakfast. 12/21/22 06/19/23  Celso Amy, PA-C  metoCLOPramide (REGLAN) 5 MG tablet Take 1 tablet (5 mg total) by mouth 3 (three) times daily. 12/21/22 06/19/23  Celso Amy, PA-C  Multiple Vitamins-Minerals (MULTIVITAMIN WITH MINERALS) tablet Take 2 tablets by mouth daily.    [provider]  naproxen sodium (ALEVE) 220 MG tablet Take 440 mg by mouth daily as needed (pain).    [provider]  norethindrone (MICRONOR) 0.35 MG tablet Take 0.35 mg by mouth daily. 12/18/22   [provider]  ondansetron (ZOFRAN) 4 MG tablet Take 1 tablet (4 mg total) by mouth every 8 (eight) hours as needed for nausea or vomiting. 12/21/22 06/19/23  Celso Amy, PA-C  pantoprazole (PROTONIX) 40 MG tablet Take 1 tablet (40 mg total) by mouth 2 (two) times daily. 12/31/22 01/30/23  Burnadette Pop, MD  potassium chloride SA (KLOR-CON M) 20 MEQ tablet Take 2 tablets (40 mEq total) by mouth daily for 3 days. 01/01/23 01/04/23  Burnadette Pop, MD  promethazine (PHENERGAN) 25 MG suppository Place 1 suppository (25 mg total) rectally every 8 (eight) hours as needed for nausea or vomiting. 01/18/23   Schutt, Edsel Petrin, PA-C      Allergies    Patient has no known allergies.    Review of Systems   Review of Systems  Constitutional:  Positive for chills. Negative for fever.  HENT:  Negative for ear pain and sore throat.   Eyes:  Negative for pain and  visual disturbance.  Respiratory:  Negative for cough and shortness of breath.   Cardiovascular:  Negative for chest pain and palpitations.  Gastrointestinal:  Positive for abdominal pain, nausea and vomiting.  Genitourinary:  Negative for dysuria and hematuria.  Musculoskeletal:  Negative for arthralgias and back pain.  Skin:  Negative for color change and rash.  Neurological:  Positive for headaches. Negative for syncope.  All other systems reviewed and are  negative.   Physical Exam Updated Vital Signs BP (!) 161/88 (BP Location: Right Arm)   Pulse 88   Temp 98.6 F (37 C) (Oral)   Resp 16   Ht 5' (1.524 m)   Wt 62.5 kg   LMP 01/16/2023 (Exact Date)   SpO2 96%   BMI 26.91 kg/m  Physical Exam Vitals and nursing note reviewed.  Constitutional:      General: She is not in acute distress.    Appearance: She is well-developed.     Comments: When walking to see patient patient was actively putting her fingers down her throat in an attempt to induce vomiting.  HENT:     Head: Normocephalic and atraumatic.  Eyes:     Conjunctiva/sclera: Conjunctivae normal.  Cardiovascular:     Rate and Rhythm: Normal rate and regular rhythm.     Heart sounds: No murmur heard. Pulmonary:     Effort: Pulmonary effort is normal. No respiratory distress.     Breath sounds: Normal breath sounds.  Abdominal:     Palpations: Abdomen is soft.     Tenderness: There is generalized abdominal tenderness.  Musculoskeletal:        General: No swelling.     Cervical back: Neck supple.  Skin:    General: Skin is warm and dry.  Neurological:     General: No focal deficit present.     Mental Status: She is alert and oriented to person, place, and time.  Psychiatric:        Mood and Affect: Mood normal.     ED Results / Procedures / Treatments   Labs (all labs ordered are listed, but only abnormal results are displayed) Labs Reviewed  BASIC METABOLIC PANEL - Abnormal; Notable for the following components:      Result Value   Sodium 133 (*)    Potassium 3.3 (*)    Chloride 97 (*)    CO2 17 (*)    Glucose, Bld 174 (*)    Calcium 8.8 (*)    Anion gap 19 (*)    All other components within normal limits  CBC WITH DIFFERENTIAL/PLATELET - Abnormal; Notable for the following components:   Platelets 473 (*)    Neutro Abs 9.0 (*)    All other components within normal limits  CBG MONITORING, ED - Abnormal; Notable for the following components:    Glucose-Capillary 172 (*)    All other components within normal limits  CBC WITH DIFFERENTIAL/PLATELET    EKG None  Radiology CT ABDOMEN PELVIS W CONTRAST  Result Date: 01/17/2023 CLINICAL DATA:  Left lower quadrant pain, nausea, emesis EXAM: CT ABDOMEN AND PELVIS WITH CONTRAST TECHNIQUE: Multidetector CT imaging of the abdomen and pelvis was performed using the standard protocol following bolus administration of intravenous contrast. RADIATION DOSE REDUCTION: This exam was performed according to the departmental dose-optimization program which includes automated exposure control, adjustment of the mA and/or kV according to patient size and/or use of iterative reconstruction technique. CONTRAST:  75mL OMNIPAQUE IOHEXOL 350 MG/ML SOLN COMPARISON:  12/28/2022 FINDINGS: Lower  chest: Slight improvement in the distal esophageal wall thickening at the GE junction when compared to 12/28/2022. Normal heart size. No pericardial or pleural effusion. Clear lung bases. Healing subacute/old left inferior rib fracture. Hepatobiliary: No focal liver abnormality is seen. No gallstones, gallbladder wall thickening, or biliary dilatation. Pancreas: Unremarkable. No pancreatic ductal dilatation or surrounding inflammatory changes. Spleen: Normal in size without focal abnormality. Adrenals/Urinary Tract: Adrenal glands are unremarkable. Kidneys are normal, without renal calculi, focal lesion, or hydronephrosis. Bladder is unremarkable. Stomach/Bowel: Stomach is within normal limits. Appendix appears normal. No evidence of bowel wall thickening, distention, or inflammatory changes. Vascular/Lymphatic: No significant vascular findings are present. No enlarged abdominal or pelvic lymph nodes. Reproductive: Uterus and bilateral adnexa are unremarkable. Other: No abdominal wall hernia or abnormality. No abdominopelvic ascites. Musculoskeletal: No acute or significant osseous findings. IMPRESSION: 1. No acute findings within the  abdomen or pelvis. No findings to account for left lower quadrant abdominal pain. 2. Slight improvement in the distal esophageal wall thickening when compared to 12/28/2022. Electronically Signed   By: Judie Petit.  Shick M.D.   On: 01/17/2023 08:17    Procedures Procedures    Medications Ordered in ED Medications  sodium chloride 0.9 % bolus 1,000 mL (0 mLs Intravenous Stopped 01/18/23 1741)  haloperidol lactate (HALDOL) injection 2 mg (2 mg Intravenous Given 01/18/23 1637)  lactated ringers bolus 1,000 mL (1,000 mLs Intravenous New Bag/Given 01/18/23 1828)    ED Course/ Medical Decision Making/ A&P                                 Medical Decision Making Amount and/or Complexity of Data Reviewed Labs: ordered.  Risk Prescription drug management.  This patient presents to the ED with chief complaint(s) of abdominal pain/nausea/vomiting with pertinent past medical history of diabetes, gastroparesis, cyclic vomiting, polysubstance abuse which further complicates the presenting complaint. The complaint involves an extensive differential diagnosis and also carries with it a high risk of complications and morbidity.    The differential diagnosis includes cannabinoid induced hyperemesis, DKA, drug disturbance, dehydration, gastroparesis  Additional history obtained: Records reviewed internal medicine progress notes.  ED Course and Reassessment:  Patient given 2 of Haldol for nausea and 1 L normal saline Her blood glucose was 172 Patient started on 1 L LR for anion gap of 19 Patient given p.o. challenge at 545PM, paramedic notified me "patient took a couple sips of a drink and then several fingers on her throat to induce vomiting."Also documented in ED notes. At 7 PM patient was seen resting comfortably in hospital bed Patient was noted to be mildly hypertensive throughout ER stay, will have patient follow-up with PCP for potential diagnosis of hypertension. Patient actively seen vomiting except  for when inducing vomiting by shoving fingers down throat.  Independent labs interpretation:  The following labs were independently interpreted:  CBC: Mildly elevated platelets at 473 BMP: Anion gap of 19, mild hypokalemia, mild hyponatremia, glucose 174  Consultation: - Consulted or discussed management/test interpretation w/ external professional: None  Consideration for admission or further workup: Considered for admission or further workup however patient has not vomited since receiving Haldol.         Final Clinical Impression(s) / ED Diagnoses Final diagnoses:  Nausea without vomiting    Rx / DC Orders ED Discharge Orders     None         Dolphus Jenny, PA-C 01/18/23 2005  Rondel Baton, MD 01/19/23 [phone]

## 2023-01-18 NOTE — ED Notes (Signed)
Pt sticking finger down throat to induce vomiting. Pt asked to stop but continues

## 2023-01-18 NOTE — ED Notes (Signed)
Pt able to drink cup of water without n/v. PO challenge passed.

## 2023-01-18 NOTE — Discharge Instructions (Addendum)
Today you were treated for nausea.  You have medication sent to your pharmacy, please pick up this medication as soon as possible and take as directed.  You were mildly hypertensive throughout your stay here in the ER please follow-up with your PCP to monitor your blood pressure.  Thank you for letting us treat you today. After reviewing your labs, I feel you are safe to go home. Please follow up with your PCP in the next several days and provide them with your records from this visit. Return to the Emergency Room if pain becomes severe or symptoms worsen.

## 2023-01-18 NOTE — ED Notes (Addendum)
Pt's CBG per EMS prior to arrival was 344.

## 2023-01-18 NOTE — ED Notes (Signed)
Pt sticking fingers down throat to make herself vomit.

## 2023-01-18 NOTE — ED Notes (Signed)
Pt trial of po--pt states she is too nauseous, she took a couple sips of a drink and then stuck her fingers down her throat to induce vomiting. Pt advised not to do that. Edp aware

## 2023-01-18 NOTE — ED Provider Notes (Signed)
  Physical Exam  BP 124/84   Pulse (!) 107   Temp 98.3 F (36.8 C) (Oral)   Resp 18   LMP 01/16/2023 (Exact Date)   SpO2 100%   Physical Exam Vitals and nursing note reviewed.  Constitutional:      General: She is not in acute distress.    Appearance: Normal appearance. She is normal weight. She is not ill-appearing.     Comments: Resting comfortably in bed sleeping  HENT:     Head: Normocephalic and atraumatic.  Pulmonary:     Effort: Pulmonary effort is normal. No respiratory distress.  Abdominal:     General: Abdomen is flat.  Musculoskeletal:        General: Normal range of motion.     Cervical back: Neck supple.  Skin:    General: Skin is warm and dry.  Neurological:     Mental Status: She is alert and oriented to person, place, and time.  Psychiatric:        Mood and Affect: Mood normal.        Behavior: Behavior normal.     Procedures  Procedures  ED Course / MDM   Clinical Course as of 01/18/23 0736  Thu Jan 18, 2023  0725 Basic metabolic panel(!) Anion gap closed [AS]    Clinical Course User Index [AS] Lula Olszewski Edsel Petrin, PA-C   Medical Decision Making Amount and/or Complexity of Data Reviewed Labs: ordered. Decision-making details documented in ED Course.  Risk Prescription drug management.   Assumed care at shift change from previous provider.  Please see previous note for full HPI.  In short, 22 year old female well-known to the department presenting for abdominal pain, nausea and vomiting.  Workup significant for evidence of dehydration with a bicarb of 18 and anion gap of 20, hyponatremia, hypokalemia.  UDS positive for opioids, cocaine, THC.  Symptoms thought to be secondary to cannabis hyperemesis.  Patient took Zofran at home prior to arrival without improvement in her symptoms.  Plan to repeat metabolic panel and reassess patient.  If still feeling better and anion gap is closed, will be safe for discharge home.  0730-BMet returned with an  anion gap of 15.  This is closed now. Patient reports marked improvement in his symptoms.  She is tolerating oral intake without difficulty.  Would like to be discharged home.  Will prescribe rectal Phenergan and educated on importance of discontinuing marijuana and following up with her GI and endocrinology providers.  She was given return precautions.  Stable discharge.  At this time there does not appear to be any evidence of an acute emergency medical condition and the patient appears stable for discharge with appropriate outpatient follow up. Diagnosis was discussed with patient who verbalizes understanding of care plan and is agreeable to discharge. I have discussed return precautions with patient who verbalizes understanding. Patient encouraged to follow-up with their PCP within 1 week. All questions answered.  Note: Portions of this report may have been transcribed using voice recognition software. Every effort was made to ensure accuracy; however, inadvertent computerized transcription errors may still be present.    Michelle Piper, PA-C 01/18/23 0736    Coral Spikes, DO 01/18/23 1559

## 2023-01-19 ENCOUNTER — Encounter (HOSPITAL_COMMUNITY): Payer: Self-pay | Admitting: Emergency Medicine

## 2023-01-19 ENCOUNTER — Emergency Department (HOSPITAL_COMMUNITY)
Admission: EM | Admit: 2023-01-19 | Discharge: 2023-01-19 | Disposition: A | Discharge status: Home / Self Care | Payer: Federal, State, Local not specified - PPO | Attending: Emergency Medicine | Admitting: Emergency Medicine

## 2023-01-19 ENCOUNTER — Other Ambulatory Visit: Payer: Self-pay

## 2023-01-19 DIAGNOSIS — E876 Hypokalemia: Secondary | ICD-10-CM

## 2023-01-19 DIAGNOSIS — R1084 Generalized abdominal pain: Secondary | ICD-10-CM | POA: Diagnosis not present

## 2023-01-19 DIAGNOSIS — R1013 Epigastric pain: Secondary | ICD-10-CM | POA: Insufficient documentation

## 2023-01-19 DIAGNOSIS — R1115 Cyclical vomiting syndrome unrelated to migraine: Secondary | ICD-10-CM | POA: Diagnosis not present

## 2023-01-19 DIAGNOSIS — R112 Nausea with vomiting, unspecified: Secondary | ICD-10-CM | POA: Diagnosis not present

## 2023-01-19 DIAGNOSIS — R1111 Vomiting without nausea: Secondary | ICD-10-CM | POA: Diagnosis not present

## 2023-01-19 DIAGNOSIS — E109 Type 1 diabetes mellitus without complications: Secondary | ICD-10-CM | POA: Insufficient documentation

## 2023-01-19 DIAGNOSIS — Z794 Long term (current) use of insulin: Secondary | ICD-10-CM | POA: Insufficient documentation

## 2023-01-19 DIAGNOSIS — R110 Nausea: Secondary | ICD-10-CM | POA: Diagnosis not present

## 2023-01-19 DIAGNOSIS — R1012 Left upper quadrant pain: Secondary | ICD-10-CM | POA: Diagnosis not present

## 2023-01-19 LAB — CBC WITH DIFFERENTIAL/PLATELET
Abs Immature Granulocytes: 0.02 10*3/uL (ref 0.00–0.07)
Basophils Absolute: 0 10*3/uL (ref 0.0–0.1)
Basophils Relative: 0 %
Eosinophils Absolute: 0 10*3/uL (ref 0.0–0.5)
Eosinophils Relative: 0 %
HCT: 39.1 % (ref 36.0–46.0)
Hemoglobin: 13.1 g/dL (ref 12.0–15.0)
Immature Granulocytes: 0 %
Lymphocytes Relative: 25 %
Lymphs Abs: 1.9 10*3/uL (ref 0.7–4.0)
MCH: 31.3 pg (ref 26.0–34.0)
MCHC: 33.5 g/dL (ref 30.0–36.0)
MCV: 93.5 fL (ref 80.0–100.0)
Monocytes Absolute: 0.5 10*3/uL (ref 0.1–1.0)
Monocytes Relative: 7 %
Neutro Abs: 4.9 10*3/uL (ref 1.7–7.7)
Neutrophils Relative %: 68 %
Platelets: 380 10*3/uL (ref 150–400)
RBC: 4.18 MIL/uL (ref 3.87–5.11)
RDW: 13 % (ref 11.5–15.5)
WBC: 7.3 10*3/uL (ref 4.0–10.5)
nRBC: 0 % (ref 0.0–0.2)

## 2023-01-19 LAB — COMPREHENSIVE METABOLIC PANEL
ALT: 17 U/L (ref 0–44)
AST: 15 U/L (ref 15–41)
Albumin: 3.6 g/dL (ref 3.5–5.0)
Alkaline Phosphatase: 65 U/L (ref 38–126)
Anion gap: 10 (ref 5–15)
BUN: <5 mg/dL — ABNORMAL LOW (ref 6–20)
CO2: 21 mmol/L — ABNORMAL LOW (ref 22–32)
Calcium: 8 mg/dL — ABNORMAL LOW (ref 8.9–10.3)
Chloride: 104 mmol/L (ref 98–111)
Creatinine, Ser: 0.61 mg/dL (ref 0.44–1.00)
GFR, Estimated: >60 mL/min (ref >60)
Glucose, Bld: 132 mg/dL — ABNORMAL HIGH (ref 70–99)
Potassium: 2.9 mmol/L — ABNORMAL LOW (ref 3.5–5.1)
Sodium: 135 mmol/L (ref 135–145)
Total Bilirubin: 1 mg/dL (ref 0.3–1.2)
Total Protein: 6.7 g/dL (ref 6.5–8.1)

## 2023-01-19 LAB — LIPASE, BLOOD: Lipase: 20 U/L (ref 11–51)

## 2023-01-19 MED ORDER — POTASSIUM CHLORIDE CRYS ER 20 MEQ PO TBCR
40.0000 meq | EXTENDED_RELEASE_TABLET | Freq: Once | ORAL | Status: AC
  Administered 2023-01-19: 40 meq via ORAL
  Filled 2023-01-19: qty 2

## 2023-01-19 MED ORDER — HALOPERIDOL LACTATE 5 MG/ML IJ SOLN
5.0000 mg | Freq: Once | INTRAMUSCULAR | Status: AC
  Administered 2023-01-19: 5 mg via INTRAVENOUS
  Filled 2023-01-19: qty 1

## 2023-01-19 MED ORDER — SODIUM CHLORIDE 0.9 % IV BOLUS
1000.0000 mL | Freq: Once | INTRAVENOUS | Status: AC
  Administered 2023-01-19: 1000 mL via INTRAVENOUS

## 2023-01-19 MED ORDER — PROCHLORPERAZINE EDISYLATE 10 MG/2ML IJ SOLN
10.0000 mg | Freq: Once | INTRAMUSCULAR | Status: AC
  Administered 2023-01-19: 10 mg via INTRAVENOUS
  Filled 2023-01-19: qty 2

## 2023-01-19 NOTE — ED Notes (Signed)
Patient fell asleep quickly after EMS left room.  No distress noted.

## 2023-01-19 NOTE — Discharge Instructions (Addendum)
Make sure you pick up the medications that are prescribed by the ER provider at the other hospital. Continue to take your home medications.  We recommend clear liquid diet for the next 2 days and advancing slowly to normal diet.

## 2023-01-19 NOTE — ED Notes (Signed)
Pt kept down ginger ale and crackers. Denies N/V.

## 2023-01-19 NOTE — ED Notes (Signed)
Pt given Ginger ale and crackers.

## 2023-01-19 NOTE — ED Provider Notes (Signed)
  Physical Exam  BP (!) 133/95 (BP Location: Left Arm)   Pulse 85   Temp 98.2 F (36.8 C) (Oral)   Resp 16   Ht 5' (1.524 m)   Wt 62.1 kg   LMP 01/16/2023 (Exact Date)   SpO2 100%   BMI 26.76 kg/m   Physical Exam  Procedures  Procedures  ED Course / MDM    Medical Decision Making Amount and/or Complexity of Data Reviewed Labs: ordered.  Risk Prescription drug management.   Pt comes in with cc of n/v. Labs ordered. Hx of gastroparesis and cyclic vomiting syndrome.  Compazine ordered.  11:19 AM I had assessed the patient after the signout.  Patient received Haldol for her nausea.  I reassessed the patient at 11 AM.  She is feeling a lot better.  Her potassium was slightly low, she was given oral potassium that she has tolerated.  Patient feels comfortable at this time and is ready to go home.  Return precautions discussed.  Clear liquid diet recommended.    Derwood Kaplan, MD 01/19/23 [phone]

## 2023-01-19 NOTE — ED Provider Notes (Signed)
[place] EMERGENCY DEPARTMENT AT [facility] Provider Note  CSN: [id number] Arrival date & time: 01/19/23 8657  Chief Complaint(s) Abdominal Pain  HPI Judy Cook is a 22 y.o. female with a past medical history listed below including type 1 diabetes, polysubstance use disorder with cyclical vomiting syndrome who presents to the emergency department with persistent abdominal pain who was previously seen in the emergency department yesterday evening.  Previous to that, she was seen 2 days before.  She had negative CT scan at that time.  Negative pregnancy as well.  Patient was evaluated for nausea without vomiting yesterday.  Returns today because she reports having emesis after being discharged.  Patient also reports decreased oral tolerance.   Abdominal Pain   Past Medical History Past Medical History:  Diagnosis Date   Allergic rhinitis 07/07/2021   COVID 2020   mild   Diabetes mellitus (HCC)    Type 1, type 5   Dysmenorrhea 02/20/2019   GERD (gastroesophageal reflux disease)    Headache    History of herpes genitalis 06/24/2021   Intractable vomiting    SIRS (systemic inflammatory response syndrome) (HCC) 12/03/2019   Patient Active Problem List   Diagnosis Date Noted   Intractable nausea and vomiting 12/28/2022   Gastroparesis 12/28/2022   GERD with esophagitis 12/28/2022   Cough 12/28/2022   AKI (acute kidney injury) (HCC) 12/28/2022   Thrombocytosis 12/28/2022   Insulin pump titration 11/20/2022   Stress diabetes 11/20/2022   Type 1 diabetes mellitus with hyperglycemia (HCC) 11/20/2022   DKA (diabetic ketoacidosis) (HCC) 11/06/2022   Hospital discharge follow-up 11/02/2022   DKA, type 1, not at goal [facility]) 11/02/2022   DKA, type 1 (HCC) 10/14/2022   Leukocytosis 10/14/2022   Hypocalcemia 10/14/2022   Hyponatremia 10/14/2022   Left lower quadrant abdominal pain 10/14/2022   GERD (gastroesophageal reflux disease) 10/14/2022   Colitis  09/21/2022   H/O diabetic ketoacidosis 09/04/2022   Rh negative state in antepartum period 07/05/2022   Constipation 07/07/2021   Marijuana use 02/20/2019   Cyclical vomiting 02/20/2019   Hypokalemia 02/20/2019   Hyperglycemia due to type 1 diabetes mellitus (HCC) 02/19/2019   Type 1 diabetes mellitus without complication (HCC) 02/26/2012   Home Medication(s) Prior to Admission medications   Medication Sig Start Date End Date Taking? Authorizing Provider  Continuous Glucose Sensor (DEXCOM G7 SENSOR) MISC Change every 10 days 11/27/22   Shamleffer, Konrad Dolores, MD  ibuprofen (ADVIL) 200 MG tablet Take 200 mg by mouth as needed for moderate pain.    [provider]  insulin aspart (NOVOLOG) 100 UNIT/ML injection Max Daily 60 units per pump Patient taking differently: Inject 10 Units into the skin 3 (three) times daily with meals. Via pump Pump is also injecting per sliding scale per pt 06/08/22   Shamleffer, Konrad Dolores, MD  linaclotide [facility]) 72 MCG capsule Take 1 capsule (72 mcg total) by mouth daily before breakfast. 12/21/22 06/19/23  Celso Amy, PA-C  metoCLOPramide (REGLAN) 5 MG tablet Take 1 tablet (5 mg total) by mouth 3 (three) times daily. 12/21/22 06/19/23  Celso Amy, PA-C  Multiple Vitamins-Minerals (MULTIVITAMIN WITH MINERALS) tablet Take 2 tablets by mouth daily.    [provider]  naproxen sodium (ALEVE) 220 MG tablet Take 440 mg by mouth daily as needed (pain).    [provider]  norethindrone (MICRONOR) 0.35 MG tablet Take 0.35 mg by mouth daily. 12/18/22   [provider]  ondansetron (ZOFRAN) 4 MG tablet Take 1 tablet (4  mg total) by mouth every 8 (eight) hours as needed for nausea or vomiting. 12/21/22 06/19/23  Celso Amy, PA-C  pantoprazole (PROTONIX) 40 MG tablet Take 1 tablet (40 mg total) by mouth 2 (two) times daily. 12/31/22 01/30/23  Burnadette Pop, MD  potassium chloride SA (KLOR-CON M) 20 MEQ tablet Take 2 tablets (40  mEq total) by mouth daily for 3 days. 01/01/23 01/04/23  Burnadette Pop, MD  promethazine (PHENERGAN) 25 MG suppository Place 1 suppository (25 mg total) rectally every 8 (eight) hours as needed for nausea or vomiting. 01/18/23   Schutt, Edsel Petrin, PA-C                                                                                                                                    Allergies Patient has no known allergies.  Review of Systems Review of Systems  Gastrointestinal:  Positive for abdominal pain.   As noted in HPI  Physical Exam Vital Signs  I have reviewed the triage vital signs BP (!) 137/99 (BP Location: Left Arm)   Pulse 85   Temp 98.2 F (36.8 C) (Oral)   Resp 16   Ht 5' (1.524 m)   Wt 62.1 kg   LMP 01/16/2023 (Exact Date)   SpO2 100%   BMI 26.76 kg/m   Physical Exam Vitals reviewed.  Constitutional:      General: She is not in acute distress.    Appearance: She is well-developed. She is not diaphoretic.  HENT:     Head: Normocephalic and atraumatic.     Right Ear: External ear normal.     Left Ear: External ear normal.     Nose: Nose normal.  Eyes:     General: No scleral icterus.    Conjunctiva/sclera: Conjunctivae normal.  Neck:     Trachea: Phonation normal.  Cardiovascular:     Rate and Rhythm: Normal rate and regular rhythm.  Pulmonary:     Effort: Pulmonary effort is normal. No respiratory distress.     Breath sounds: No stridor.  Abdominal:     General: There is no distension.     Tenderness: There is abdominal tenderness in the epigastric area and left upper quadrant. There is no guarding or rebound.  Musculoskeletal:        General: Normal range of motion.     Cervical back: Normal range of motion.  Neurological:     Mental Status: She is alert and oriented to person, place, and time.  Psychiatric:        Behavior: Behavior normal.     ED Results and Treatments Labs (all labs ordered are listed, but only abnormal results are  displayed) Labs Reviewed  COMPREHENSIVE METABOLIC PANEL - Abnormal; Notable for the following components:      Result Value   Potassium 2.9 (*)    CO2 21 (*)    Glucose, Bld 132 (*)    BUN <5 (*)  Calcium 8.0 (*)    All other components within normal limits  LIPASE, BLOOD  CBC WITH DIFFERENTIAL/PLATELET                                                                                                                         EKG  EKG Interpretation Date/Time:    Ventricular Rate:    PR Interval:    QRS Duration:    QT Interval:    QTC Calculation:   R Axis:      Text Interpretation:         Radiology No results found.  Medications Ordered in ED Medications  sodium chloride 0.9 % bolus 1,000 mL (1,000 mLs Intravenous New Bag/Given 01/19/23 0716)  prochlorperazine (COMPAZINE) injection 10 mg (10 mg Intravenous Given 01/19/23 0758)  haloperidol lactate (HALDOL) injection 5 mg (5 mg Intravenous Given 01/19/23 0834)   Procedures Procedures  (including critical care time) Medical Decision Making / ED Course   Medical Decision Making Amount and/or Complexity of Data Reviewed Labs: ordered.  Risk Prescription drug management.    Abdominal pain with nausea vomiting. Will assess for electrolyte derangements/metabolic derangements including DKA.  Will assess for bili obstruction or pancreatitis. IV fluids for hydration and IV Compazine for antiemetics.   Patient care turned over to oncoming provider. Patient case and results discussed in detail; please see their note for further ED managment.    Final Clinical Impression(s) / ED Diagnoses Final diagnoses:  None    This chart was dictated using voice recognition software.  Despite best efforts to proofread,  errors can occur which can change the documentation meaning.    Nira Conn, MD 01/19/23 [phone]

## 2023-01-19 NOTE — ED Triage Notes (Signed)
Patient BIB EMS for evaluation of generalized abdominal pain.  Having nausea.  Has been seen at [facility] x 3 since 01/17/23 for same symptoms.  EMS reports pain was sleeping on their arrival.  Reported she wanted to be taken to hospital "because the pain would return and she wanted to be here when it did."  CBG was 198.  Hx of gastroparesis.

## 2023-01-30 ENCOUNTER — Other Ambulatory Visit (HOSPITAL_BASED_OUTPATIENT_CLINIC_OR_DEPARTMENT_OTHER): Payer: Self-pay | Admitting: Obstetrics & Gynecology

## 2023-01-30 DIAGNOSIS — R030 Elevated blood-pressure reading, without diagnosis of hypertension: Secondary | ICD-10-CM

## 2023-01-30 DIAGNOSIS — Z3041 Encounter for surveillance of contraceptive pills: Secondary | ICD-10-CM

## 2023-01-31 ENCOUNTER — Other Ambulatory Visit (HOSPITAL_BASED_OUTPATIENT_CLINIC_OR_DEPARTMENT_OTHER): Payer: Self-pay | Admitting: *Deleted

## 2023-01-31 DIAGNOSIS — Z3041 Encounter for surveillance of contraceptive pills: Secondary | ICD-10-CM

## 2023-01-31 DIAGNOSIS — R030 Elevated blood-pressure reading, without diagnosis of hypertension: Secondary | ICD-10-CM

## 2023-01-31 MED ORDER — NORETHINDRONE 0.35 MG PO TABS: 1.0000 | ORAL_TABLET | Freq: Every day | ORAL | 1 refills | Status: DC

## 2023-02-01 ENCOUNTER — Ambulatory Visit (INDEPENDENT_AMBULATORY_CARE_PROVIDER_SITE_OTHER): Payer: Federal, State, Local not specified - PPO | Admitting: Nurse Practitioner

## 2023-02-01 VITALS — BP 120/72 | HR 103 | Temp 98.5°F | Ht 60.0 in | Wt 138.0 lb

## 2023-02-01 DIAGNOSIS — E876 Hypokalemia: Secondary | ICD-10-CM | POA: Diagnosis not present

## 2023-02-01 DIAGNOSIS — F1990 Other psychoactive substance use, unspecified, uncomplicated: Secondary | ICD-10-CM

## 2023-02-01 DIAGNOSIS — Z09 Encounter for follow-up examination after completed treatment for conditions other than malignant neoplasm: Secondary | ICD-10-CM

## 2023-02-01 DIAGNOSIS — K3184 Gastroparesis: Secondary | ICD-10-CM | POA: Diagnosis not present

## 2023-02-01 NOTE — Assessment & Plan Note (Signed)
Overall patient is feeling much better.  Will repeat metabolic panel tomorrow.  Further recommendation may be made based on his results.

## 2023-02-01 NOTE — Assessment & Plan Note (Signed)
Lab is closed today at time of appointment, patient is able to come back tomorrow for labs to be drawn.  Further recommendations may be made based upon these results.

## 2023-02-01 NOTE — Assessment & Plan Note (Signed)
Encouraged abstinence from illicit drug use.  Offered referral to psychiatry services well to assist her with this.  For now patient has declined, but will let me know if she changes her mind.

## 2023-02-01 NOTE — Assessment & Plan Note (Signed)
Chronic Again encouraged abstinence from marijuana or other illicit drug use. Offered referral to psychiatry to assist with stopping use of these substances, patient declines at this time but let me know she will reach out if she would like to reconsider this in the future. Follow-up with endocrinology as well. Lab is closed today at time of patient's appointment, she will return tomorrow for metabolic panel to monitor electrolytes and kidney function.

## 2023-02-01 NOTE — Progress Notes (Signed)
Established Patient Office Visit  Subjective   Patient ID: Judy Cook, female    DOB: Jul 06, 2000  Age: 22 y.o. MRN: [id number]  Chief Complaint  Patient presents with   Hospitalization Follow-up    Patient arrives today for ER visit follow-up. She has been seen in the ER or hospitalized 14 times for similar complaint since February of 2024. She has type 1 diabetes with gastroparesis that is worsened by cannabis use. She has also had evidence of other illicit drug use including cocaine and opioids when hospitalized as well. Today, she reports that is feeling back to her baseline and her blood sugars have been fairly labile.  She reports that she does continue to smoke marijuana, despite having been recommended to stop.  She reports that this is just a personal choice.  She has no other acute concerns today.  Continues to decline flu shot. No N/V today.     ROS: see HPI    Objective:     BP 120/72   Pulse (!) 103   Temp 98.5 F (36.9 C) (Temporal)   Ht 5' (1.524 m)   Wt 138 lb (62.6 kg)   LMP 01/16/2023 (Exact Date)   SpO2 98%   BMI 26.95 kg/m  BP Readings from Last 3 Encounters:  02/01/23 120/72  01/19/23 112/72  01/18/23 (!) 161/88   Wt Readings from Last 3 Encounters:  02/01/23 138 lb (62.6 kg)  01/19/23 137 lb (62.1 kg)  01/18/23 137 lb 12.6 oz (62.5 kg)      Physical Exam Vitals reviewed.  Constitutional:      General: She is not in acute distress.    Appearance: Normal appearance.  HENT:     Head: Normocephalic and atraumatic.  Neck:     Vascular: No carotid bruit.  Cardiovascular:     Rate and Rhythm: Normal rate and regular rhythm.     Pulses: Normal pulses.     Heart sounds: Normal heart sounds.  Pulmonary:     Effort: Pulmonary effort is normal.     Breath sounds: Normal breath sounds.  Skin:    General: Skin is warm and dry.  Neurological:     General: No focal deficit present.     Mental Status: She is alert and oriented to person,  place, and time.  Psychiatric:        Mood and Affect: Mood normal.        Behavior: Behavior normal.        Judgment: Judgment normal.      No results found for any visits on 02/01/23.    The ASCVD Risk score (Arnett DK, et al., 2019) failed to calculate for the following reasons:   The 2019 ASCVD risk score is only valid for ages 34 to 49    Assessment & Plan:   Problem List Items Addressed This Visit       Digestive   Gastroparesis    Chronic Again encouraged abstinence from marijuana or other illicit drug use. Offered referral to psychiatry to assist with stopping use of these substances, patient declines at this time but let me know she will reach out if she would like to reconsider this in the future. Follow-up with endocrinology as well. Lab is closed today at time of patient's appointment, she will return tomorrow for metabolic panel to monitor electrolytes and kidney function.        Other   Hypokalemia    Lab is closed today at time of appointment,  patient is able to come back tomorrow for labs to be drawn.  Further recommendations may be made based upon these results.      Relevant Orders   Comprehensive metabolic panel   Hospital discharge follow-up - Primary    Overall patient is feeling much better.  Will repeat metabolic panel tomorrow.  Further recommendation may be made based on his results.      Relevant Orders   Comprehensive metabolic panel   Polysubstance use disorder    Encouraged abstinence from illicit drug use.  Offered referral to psychiatry services well to assist her with this.  For now patient has declined, but will let me know if she changes her mind.       Return in about 3 months (around 05/04/2023).    Elenore Paddy, NP

## 2023-02-02 LAB — COMPREHENSIVE METABOLIC PANEL
ALT: 11 U/L (ref 0–35)
AST: 14 U/L (ref 0–37)
Albumin: 4.1 g/dL (ref 3.5–5.2)
Alkaline Phosphatase: 63 U/L (ref 39–117)
BUN: 9 mg/dL (ref 6–23)
CO2: 23 meq/L (ref 19–32)
Calcium: 9.4 mg/dL (ref 8.4–10.5)
Chloride: 100 meq/L (ref 96–112)
Creatinine, Ser: 0.79 mg/dL (ref 0.40–1.20)
GFR: 105.07 mL/min (ref >60.00)
Glucose, Bld: 340 mg/dL — ABNORMAL HIGH (ref 70–99)
Potassium: 4.2 meq/L (ref 3.5–5.1)
Sodium: 133 meq/L — ABNORMAL LOW (ref 135–145)
Total Bilirubin: 0.5 mg/dL (ref 0.2–1.2)
Total Protein: 6.5 g/dL (ref 6.0–8.3)

## 2023-02-08 DIAGNOSIS — E1065 Type 1 diabetes mellitus with hyperglycemia: Secondary | ICD-10-CM | POA: Diagnosis not present

## 2023-02-15 ENCOUNTER — Ambulatory Visit (HOSPITAL_BASED_OUTPATIENT_CLINIC_OR_DEPARTMENT_OTHER): Payer: Federal, State, Local not specified - PPO

## 2023-02-15 ENCOUNTER — Other Ambulatory Visit (HOSPITAL_COMMUNITY)
Admission: RE | Admit: 2023-02-15 | Discharge: 2023-02-15 | Disposition: A | Discharge status: Home / Self Care | Payer: Federal, State, Local not specified - PPO | Source: Ambulatory Visit | Attending: Obstetrics & Gynecology | Admitting: Obstetrics & Gynecology

## 2023-02-15 VITALS — BP 127/86 | HR 77 | Ht 60.0 in | Wt 136.4 lb

## 2023-02-15 DIAGNOSIS — R350 Frequency of micturition: Secondary | ICD-10-CM | POA: Diagnosis not present

## 2023-02-15 DIAGNOSIS — N898 Other specified noninflammatory disorders of vagina: Secondary | ICD-10-CM | POA: Diagnosis not present

## 2023-02-15 DIAGNOSIS — L292 Pruritus vulvae: Secondary | ICD-10-CM | POA: Diagnosis not present

## 2023-02-15 NOTE — Addendum Note (Signed)
Addended by: Hendricks Milo on: 02/15/2023 12:49 PM   Modules accepted: Orders

## 2023-02-15 NOTE — Progress Notes (Signed)
Pt presented to office for having vaginal itching, frequent urination and abdominal pressure. Pt gave a urine sample to be sent out for a urine culture and aptima swab for vaginal itching. Pt was instructed and presented with instructions for urine sample and aptima swab. Pt is aware will we be in contact with her once results come back.

## 2023-02-17 LAB — URINE CULTURE

## 2023-02-19 ENCOUNTER — Other Ambulatory Visit (HOSPITAL_BASED_OUTPATIENT_CLINIC_OR_DEPARTMENT_OTHER): Payer: Self-pay | Admitting: *Deleted

## 2023-02-19 LAB — CERVICOVAGINAL ANCILLARY ONLY
Bacterial Vaginitis (gardnerella): POSITIVE — AB
Candida Glabrata: NEGATIVE
Candida Vaginitis: POSITIVE — AB
Comment: NEGATIVE
Comment: NEGATIVE
Comment: NEGATIVE

## 2023-02-19 MED ORDER — METRONIDAZOLE 0.75 % VA GEL: 1.0000 | Freq: Every day | VAGINAL | 0 refills | Status: DC

## 2023-02-19 MED ORDER — FLUCONAZOLE 150 MG PO TABS: 150.0000 mg | ORAL_TABLET | Freq: Once | ORAL | 0 refills | Status: AC

## 2023-03-14 ENCOUNTER — Ambulatory Visit (HOSPITAL_BASED_OUTPATIENT_CLINIC_OR_DEPARTMENT_OTHER): Payer: Federal, State, Local not specified - PPO

## 2023-04-13 ENCOUNTER — Ambulatory Visit: Payer: Federal, State, Local not specified - PPO | Admitting: Internal Medicine

## 2023-04-13 VITALS — BP 120/80 | HR 100 | Wt 138.0 lb

## 2023-04-13 DIAGNOSIS — E1065 Type 1 diabetes mellitus with hyperglycemia: Secondary | ICD-10-CM | POA: Diagnosis not present

## 2023-04-13 LAB — POCT GLYCOSYLATED HEMOGLOBIN (HGB A1C): Hemoglobin A1C: 8.2 % — AB (ref 4.0–5.6)

## 2023-04-13 NOTE — Progress Notes (Signed)
Name: Judy Cook  MRN/ DOB: [id number], [birth year]   Age/ Sex: 22 y.o., female    PCP: Elenore Paddy, NP   Reason for Endocrinology Evaluation: Type 1 Diabetes Mellitus     Date of Initial Endocrinology Visit: 07/26/2021    PATIENT IDENTIFIER: [name] is a 22 y.o. female with a past medical history of T1DM. The patient presented for initial endocrinology clinic visit on 07/26/2021  for consultative assistance with her diabetes management.    HPI: Judy Cook was    Diagnosed with DM at age 22               Hemoglobin A1c has ranged from 10.7% in 2023, peaking at 12% in 2021. Patient required assistance for hypoglycemia: when she was young Patient has required hospitalization within the last 1 year from hyper or hypoglycemia: Patient with history of DKA but not in 2022  On her initial visit to our clinic she had an A1c of 10.7%, she was referred for pump training on the Tandem 10/2021     SUBJECTIVE:   During the last visit (11/20/2022): A1c 7.6% .     Today (04/13/23): Any Judy Cook is here for a follow up on diabetes management. She checks her blood sugars multiple  times daily through CGM. The patient has  had hypoglycemic episodes since the last clinic visit.  She continues to present to the ED multiple times due to cyclical vomiting syndrome and cannabis use  She has decreased cannabis use recently  N/V has improved  Has regular bowel movements.   This patient with type 1 diabetes is treated with Tandem  (insulin pump). During the visit the pump basal and bolus doses were reviewed including carb/insulin rations and supplemental doses. The clinical list was updated. The glucose meter download was reviewed in detail to determine if the current pump settings are providing the best glycemic control without excessive hypoglycemia.  Pump and meter download:   Pump   Tandem  Settings   Insulin type   Novolog    Basal rate        0000 0.8 u/h               I:C ratio       0000 1:1    #6  g with a meal,# 3 g with a snack              Sensitivity       0000  45      Goal       0000  120             Type & Model of Pump: Tandem  Insulin Type: Currently using NovoLog.  Body mass index is 26.95 kg/m.  PUMP STATISTICS: Average BG: 259 Average Daily Carbs (g): 4 Average Total Daily Insulin: 42.36 Average Daily Basal: 19.13(45 %) Average Daily Bolus: 23.23(55%)      HOME DIABETES REGIMEN:  Novolog    Statin: no ACE-I/ARB: no Prior Diabetic Education: yes   CONTINUOUS GLUCOSE MONITORING RECORD INTERPRETATION   N/a   DIABETIC COMPLICATIONS: Microvascular complications:   Denies: CKD, retinopathy, neuropathy  Last eye exam: Completed years   Macrovascular complications:   Denies: CAD, PVD, CVA   PAST HISTORY: Past Medical History:  Past Medical History:  Diagnosis Date   Allergic rhinitis 07/07/2021   COVID 2020   mild   Diabetes mellitus (HCC)    Type 1, type 5   Dysmenorrhea  02/20/2019   GERD (gastroesophageal reflux disease)    Headache    History of herpes genitalis 06/24/2021   Intractable vomiting    SIRS (systemic inflammatory response syndrome) (HCC) 12/03/2019   Past Surgical History:  Past Surgical History:  Procedure Laterality Date   DILATION AND EVACUATION N/A 07/05/2022   Procedure: DILATATION AND EVACUATION;  Surgeon: Seymour Bing, MD;  Location: [facility] OR;  Service: Gynecology;  Laterality: N/A;   OPERATIVE ULTRASOUND N/A 07/05/2022   Procedure: OPERATIVE ULTRASOUND;  Surgeon: [place] Bing, MD;  Location: [facility] OR;  Service: Gynecology;  Laterality: N/A;    Social History:  reports that she has been smoking cigarettes. She has never used smokeless tobacco. She reports current alcohol use. She reports current drug use. Drug: Marijuana. Family History:  Family History  Problem Relation Age of Onset   Diabetes Paternal Grandfather    Heart attack  Paternal Grandfather    Diabetes Paternal Grandmother    Cancer Maternal Grandmother        lung   Alcohol abuse Maternal Grandfather    Alcohol abuse Father    Drug abuse Father    High Cholesterol Father    Alcohol abuse Mother    Depression Mother    Drug abuse Mother      HOME MEDICATIONS: Allergies as of 04/13/2023   No Known Allergies      Medication List        Accurate as of April 13, 2023 11:15 AM. If you have any questions, ask your nurse or doctor.          Dexcom G7 Sensor Misc Change every 10 days   ibuprofen 200 MG tablet Commonly known as: ADVIL Take 200 mg by mouth as needed for moderate pain.   insulin aspart 100 UNIT/ML injection Commonly known as: NovoLOG Max Daily 60 units per pump What changed:  how much to take how to take this when to take this additional instructions   linaclotide 72 MCG capsule Commonly known as: LINZESS Take 1 capsule (72 mcg total) by mouth daily before breakfast.   metoCLOPramide 5 MG tablet Commonly known as: Reglan Take 1 tablet (5 mg total) by mouth 3 (three) times daily.   metroNIDAZOLE 0.75 % vaginal gel Commonly known as: METROGEL Place 1 Applicatorful vaginally at bedtime. Use for 5 nights.   multivitamin with minerals tablet Take 2 tablets by mouth daily.   naproxen sodium 220 MG tablet Commonly known as: ALEVE Take 440 mg by mouth daily as needed (pain).   norethindrone 0.35 MG tablet Commonly known as: MICRONOR Take 1 tablet (0.35 mg total) by mouth daily.   ondansetron 4 MG tablet Commonly known as: ZOFRAN Take 1 tablet (4 mg total) by mouth every 8 (eight) hours as needed for nausea or vomiting.   pantoprazole 40 MG tablet Commonly known as: Protonix Take 1 tablet (40 mg total) by mouth 2 (two) times daily.   potassium chloride SA 20 MEQ tablet Commonly known as: KLOR-CON M Take 2 tablets (40 mEq total) by mouth daily for 3 days.   promethazine 25 MG suppository Commonly known  as: PHENERGAN Place 1 suppository (25 mg total) rectally every 8 (eight) hours as needed for nausea or vomiting.         ALLERGIES: No Known Allergies   REVIEW OF SYSTEMS: A comprehensive ROS was conducted with the patient and is negative except as per HPI and below:      OBJECTIVE:   VITAL SIGNS: BP 120/80 (BP  Location: Left Arm, Patient Position: Sitting, Cuff Size: Small)   Pulse 100   Wt 138 lb (62.6 kg)   SpO2 99%   BMI 26.95 kg/m    PHYSICAL EXAM:    Exam: General: Pt appears well and is in NAD  Lungs: Clear with good BS bilat   Heart: RRR   Extremities: No pretibial edema.   Neuro: MS is good with appropriate affect, pt is alert and Ox3    DM foot exam: 04/13/2023  The skin of the feet is intact without sores or ulcerations. The pedal pulses are 2+ on right and 2+ on left. The sensation is intact to a screening 5.07, 10 gram monofilament bilaterally    DATA REVIEWED:  Lab Results  Component Value Date   HGBA1C 8.2 (A) 04/13/2023   HGBA1C 7.6 (H) 09/05/2022   HGBA1C 7.3 (A) 06/08/2022    Latest Reference Range & Units 02/02/23 08:39  Sodium 135 - 145 mEq/L 133 (L)  Potassium 3.5 - 5.1 mEq/L 4.2  Chloride 96 - 112 mEq/L 100  CO2 19 - 32 mEq/L 23  Glucose 70 - 99 mg/dL 295 (H)  BUN 6 - 23 mg/dL 9  Creatinine 6.21 - 3.08 mg/dL 6.57  Calcium 8.4 - 84.6 mg/dL 9.4  Alkaline Phosphatase 39 - 117 U/L 63  Albumin 3.5 - 5.2 g/dL 4.1  AST 0 - 37 U/L 14  ALT 0 - 35 U/L 11  Total Protein 6.0 - 8.3 g/dL 6.5  Total Bilirubin 0.2 - 1.2 mg/dL 0.5  GFR >96.29 mL/min 105.07  (L): Data is abnormally low (H): Data is abnormally high  ASSESSMENT / PLAN / RECOMMENDATIONS:   1) Type 1 Diabetes Mellitus, Poorly controlled, Without complications - Most recent A1c of 8.2%. Goal A1c <7.0%.    -Patient has been noted with hyperglycemia -She is using the Dexcom G7, but has been manually entering glucose data into the pump as she has not been able to upgrade her  tandem software due to lack of home computer capability. -I did asked the patient to contact tandem and ask for customer support to help with this -I will increase her basal rate during the day as below -I will also change her bolus as below     Pump   Tandem  Settings   Insulin type   Novolog    Basal rate       0000 0.8 u/h    0800 1.00          I:C ratio       0000 1:1    #8 g with a meal,# 4 g with a snack              Sensitivity       0000  45      Goal       0000  120          MEDICATIONS:   Novolog    EDUCATION / INSTRUCTIONS: BG monitoring instructions: Patient is instructed to check her blood sugars 3  x daily before meals   Call [place] [facility] if: BG persistently < 70  I reviewed the Rule of 15 for the treatment of hypoglycemia in detail with the patient. Literature supplied.   2) Diabetic complications:  Eye: Does not have known diabetic retinopathy.  Neuro/ Feet: Does not have known diabetic peripheral neuropathy. Renal: Patient does not have known baseline CKD. She is not on an ACEI/ARB at present.    3) Lipids:  No indication for statin therapy at this time      Follow-up in 4 months    Signed electronically by: Lyndle Herrlich, MD  [facility]  [facility] [address] [place] [place], [place] [address] Phone: [phone] FAX: [phone]   CC: Elenore Paddy, NP [address] [place] [address] Phone: [phone]  Fax: [phone]    Return to Endocrinology clinic as below: Future Appointments  Date Time Provider Department Center  05/04/2023  9:40 AM Elenore Paddy, NP [facility] None

## 2023-04-13 NOTE — Patient Instructions (Signed)

## 2023-04-26 ENCOUNTER — Encounter: Payer: Self-pay | Admitting: Internal Medicine

## 2023-05-04 ENCOUNTER — Ambulatory Visit (INDEPENDENT_AMBULATORY_CARE_PROVIDER_SITE_OTHER): Payer: Federal, State, Local not specified - PPO | Admitting: Nurse Practitioner

## 2023-05-04 VITALS — BP 104/74 | HR 92 | Temp 97.6°F | Ht 60.0 in | Wt 142.1 lb

## 2023-05-04 DIAGNOSIS — Z113 Encounter for screening for infections with a predominantly sexual mode of transmission: Secondary | ICD-10-CM | POA: Insufficient documentation

## 2023-05-04 DIAGNOSIS — E871 Hypo-osmolality and hyponatremia: Secondary | ICD-10-CM | POA: Diagnosis not present

## 2023-05-04 DIAGNOSIS — Z3200 Encounter for pregnancy test, result unknown: Secondary | ICD-10-CM | POA: Diagnosis not present

## 2023-05-04 LAB — BASIC METABOLIC PANEL
BUN: 11 mg/dL (ref 6–23)
CO2: 27 meq/L (ref 19–32)
Calcium: 9.7 mg/dL (ref 8.4–10.5)
Chloride: 100 meq/L (ref 96–112)
Creatinine, Ser: 0.96 mg/dL (ref 0.40–1.20)
GFR: 83.01 mL/min (ref >60.00)
Glucose, Bld: 186 mg/dL — ABNORMAL HIGH (ref 70–99)
Potassium: 3.8 meq/L (ref 3.5–5.1)
Sodium: 136 meq/L (ref 135–145)

## 2023-05-04 LAB — POCT URINE PREGNANCY: Preg Test, Ur: NEGATIVE

## 2023-05-04 LAB — HCG, QUANTITATIVE, PREGNANCY: Quantitative HCG: <0.6 m[IU]/mL

## 2023-05-04 NOTE — Patient Instructions (Signed)
 Tums or Famotidine as needed

## 2023-05-04 NOTE — Assessment & Plan Note (Signed)
 BMP ordered, further conditions may be made based upon the results.

## 2023-05-04 NOTE — Assessment & Plan Note (Signed)
 Will test for STDs, further recommendations may be made based upon the results.

## 2023-05-04 NOTE — Progress Notes (Signed)
 Established Patient Office Visit  Subjective   Patient ID: Judy Cook, female    DOB: [birth year]  Age: 23 y.o. MRN: [id number]  Chief Complaint  Patient presents with   Possible Pregnancy    Patient has for follow-up.  She reports last menstrual cycle was the week of Christmas, and she has not missed a menstrual cycle but she feels that that cycle was lighter than normal.  She is concerned about possible pregnancy and is requesting pregnancy test today.  She takes OCP, but reports that she is does not always take this consistently. She would also like STD screening.  Otherwise she reports that she is doing well.  No reoccurrence of nausea and vomiting, continues to follow-up with endocrinology last A1c above 8.  Per chart review last metabolic panel did identify normal potassium but slightly low sodium.  Patient has no other concerns today.    Review of Systems  Respiratory:  Negative for shortness of breath.   Cardiovascular:  Negative for chest pain.  Gastrointestinal:  Negative for abdominal pain and blood in stool.      Objective:     BP 104/74   Pulse 92   Temp 97.6 F (36.4 C) (Temporal)   Ht 5' (1.524 m)   Wt 142 lb 2 oz (64.5 kg)   LMP 04/16/2023   SpO2 99%   BMI 27.76 kg/m    Physical Exam Vitals reviewed.  Constitutional:      General: She is not in acute distress.    Appearance: Normal appearance.  HENT:     Head: Normocephalic and atraumatic.  Neck:     Vascular: No carotid bruit.  Cardiovascular:     Rate and Rhythm: Normal rate and regular rhythm.     Pulses: Normal pulses.     Heart sounds: Normal heart sounds.  Pulmonary:     Effort: Pulmonary effort is normal.     Breath sounds: Normal breath sounds.  Skin:    General: Skin is warm and dry.  Neurological:     General: No focal deficit present.     Mental Status: She is alert and oriented to person, place, and time.  Psychiatric:        Mood and Affect: Mood normal.         Behavior: Behavior normal.        Judgment: Judgment normal.      Results for orders placed or performed in visit on 05/04/23  POCT urine pregnancy  Result Value Ref Range   Preg Test, Ur Negative Negative      The ASCVD Risk score (Arnett DK, et al., 2019) failed to calculate for the following reasons:   The 2019 ASCVD risk score is only valid for ages 59 to 48    Assessment & Plan:   Problem List Items Addressed This Visit       Other   Hyponatremia   BMP ordered, further conditions may be made based upon the results.      Relevant Orders   Basic metabolic panel   Encounter for pregnancy test, result unknown - Primary   Urine pregnancy test negative She is not quite overdue for her, thus there may be a risk for false negative.  So we will also verify with serum hCG levels.  Further recommendations may be made based upon his results.      Relevant Orders   POCT urine pregnancy (Completed)   hCG, serum, qualitative   hCG, quantitative, pregnancy  Screening examination for STD (sexually transmitted disease)   Will test for STDs, further recommendations may be made based upon the results.      Relevant Orders   NuSwab Vaginitis Plus (VG+)   RPR   HIV Antibody (routine testing w rflx)    Return in about 4 months (around 09/01/2023) for CPE with Judy.    Judy FORBES Pereyra, NP

## 2023-05-04 NOTE — Assessment & Plan Note (Signed)
 Urine pregnancy test negative She is not quite overdue for her, thus there may be a risk for false negative.  So we will also verify with serum hCG levels.  Further recommendations may be made based upon his results.

## 2023-05-05 LAB — HCG, SERUM, QUALITATIVE: Preg, Serum: NEGATIVE

## 2023-05-07 LAB — NUSWAB VAGINITIS PLUS (VG+)
BVAB 2: HIGH {score} — AB
Candida albicans, NAA: POSITIVE — AB
Candida glabrata, NAA: POSITIVE — AB
Chlamydia trachomatis, NAA: NEGATIVE
Megasphaera 1: HIGH {score} — AB
Neisseria gonorrhoeae, NAA: NEGATIVE
Trich vag by NAA: NEGATIVE

## 2023-05-07 LAB — RPR: RPR Ser Ql: NONREACTIVE

## 2023-05-07 LAB — HIV ANTIBODY (ROUTINE TESTING W REFLEX): HIV 1&2 Ab, 4th Generation: NONREACTIVE

## 2023-05-08 ENCOUNTER — Other Ambulatory Visit: Payer: Self-pay | Admitting: Nurse Practitioner

## 2023-05-08 ENCOUNTER — Encounter: Payer: Self-pay | Admitting: Nurse Practitioner

## 2023-05-08 DIAGNOSIS — B3731 Acute candidiasis of vulva and vagina: Secondary | ICD-10-CM

## 2023-05-08 DIAGNOSIS — B9689 Other specified bacterial agents as the cause of diseases classified elsewhere: Secondary | ICD-10-CM

## 2023-05-08 MED ORDER — FLUCONAZOLE 150 MG PO TABS
150.0000 mg | ORAL_TABLET | Freq: Every day | ORAL | 0 refills | Status: DC
Start: 2023-05-08 — End: 2023-08-02

## 2023-05-08 MED ORDER — METRONIDAZOLE 0.75 % VA GEL
VAGINAL | 0 refills | Status: DC
Start: 2023-05-08 — End: 2023-08-08

## 2023-05-17 ENCOUNTER — Telehealth: Payer: Self-pay | Admitting: *Deleted

## 2023-05-17 NOTE — Progress Notes (Signed)
Complex Care Management Care Guide Note  05/17/2023 Name: [name] MRN: [id number] DOB: 12/27/2000  Judy Cook is a 23 y.o. year old female who is a primary care patient of Elenore Paddy, NP and is actively engaged with the care management team. I reached out to Pincus Large by phone today to assist with scheduling  with the RN Case Manager.  Follow up plan: pt declined to reschedule at this time   Burman Nieves, CMA, Care Guide [facility]  [facility], [facility] Guide Direct Dial: [phone]  Fax: [phone] Website: [email or web address]

## 2023-05-18 NOTE — Patient Outreach (Signed)
  Care Coordination   Documentation  Visit Note   05/18/2023 Name: Judy Cook MRN: [id number] DOB: Aug 23, 2000  Judy Cook is a 23 y.o. year old female who sees Elenore Paddy, NP for primary care. No communication was made during this encounter.  Documentation encounter only. Patient declines further follow up. Goals closed, case closed.  Goals Addressed             This Visit's Progress    COMPLETED: connect with community resources to manage health needs       Activities and task to complete in order to accomplish goals.   Follow up on your  food benefits and Medicaid.  Apply for disability Keep all upcoming appointment discussed today Continue with compliance of taking medication prescribed by Doctor Continue to utilize the Food options we discussed below [employer] ( you can download this app)    https://findfood.[employer].co.za         [address] [place] [address] [place], [place], [address] [phone]  Call to schedule a time to pick up food  Definition [facility] [address] Wednesday [id number], Friday 11am-2pm, and Saturday 9am-12pm  [facility] Location: [address] [place], [address], [place] Time: 1st and 3rd Wednesday 9:00am-12:00noon. Contact info: [phone]  Can come once a month. Please arrive and sign in no later than 11:15am so everyone can be served by 12noon.  This is a drive through for pick up  Apply for foodstamps  at the department of Social Services or you can apply on line  http://www.hunter-osborn.org/   Apply for Disability  [employer] [address] [place], [place] [address] [phone] / [phone]://www.ssa.gov/disability       COMPLETED: continue to  improve post hospitalization       Interventions Today    Flowsheet  Row Most Recent Value  General Interventions   General Interventions Discussed/Reviewed General Interventions Reviewed  [patient declines care managment follow up-goals closed. case closed.]            SDOH assessments and interventions completed:  No  Care Coordination Interventions:  No, not indicated   Follow up plan: No further intervention required.   Encounter Outcome:  Patient Visit Completed   Kathyrn Sheriff, RN, MSN, BSN, CCM [place]  [facility], Population Health Case Manager Phone: [phone]

## 2023-05-21 ENCOUNTER — Telehealth: Payer: Self-pay

## 2023-05-21 NOTE — Progress Notes (Signed)
Care Guide Pharmacy Note  05/21/2023 Name: Judy Cook MRN: [id number] DOB: 2001/04/10  Referred By: Elenore Paddy, NP Reason for referral: Care Coordination (TNM Diabetes. )   Judy Cook is a 23 y.o. year old female who is a primary care patient of Elenore Paddy, NP.  Judy Cook was referred to the pharmacist for assistance related to: DMII  An unsuccessful telephone outreach was attempted today to contact the patient who was referred to the pharmacy team for assistance with diabetes. Additional attempts will be made to contact the patient.  [facility]  [facility], [facility] Health Care Management Assistant Direct Dial: [phone]  Fax: [phone]

## 2023-05-23 ENCOUNTER — Telehealth: Payer: Self-pay

## 2023-05-23 NOTE — Progress Notes (Signed)
Care Guide Pharmacy Note  05/23/2023 Name: Judy Cook MRN: [id number] DOB: [birth year]  Referred By: Elenore Paddy, NP Reason for referral: Care Coordination (TNM Diabetes. )   Judy Cook is a 23 y.o. year old female who is a primary care patient of Elenore Paddy, NP.  Judy Cook was referred to the pharmacist for assistance related to: DMII  Successful contact was made with the patient to discuss pharmacy services including being ready for the pharmacist to call at least 5 minutes before the scheduled appointment time and to have medication bottles and any blood pressure readings ready for review. The patient agreed to meet with the pharmacist via telephone visit on (date/time). 06/12/23 at 2:00 p.m.   [facility]  [facility], [facility] Health Care Management Assistant Direct Dial: [phone]  Fax: [phone]

## 2023-06-06 DIAGNOSIS — E1065 Type 1 diabetes mellitus with hyperglycemia: Secondary | ICD-10-CM | POA: Diagnosis not present

## 2023-06-11 ENCOUNTER — Telehealth: Payer: Self-pay

## 2023-06-11 NOTE — Telephone Encounter (Signed)
Returned call - appt rescheduled to 2/19 at 3 PM

## 2023-06-11 NOTE — Telephone Encounter (Signed)
Copied from CRM [id number]. Topic: Appointments - Scheduling Inquiry for Clinic >> Jun 11, 2023  3:54 PM Judy Cook wrote: Reason for CRM: Patient was calling to reschedule Patient Outreach appointment on 02/18 at 2:00 with pharmacist at [facility]. Was inquiring if her appointment could be rescheduled to 3:00 PM due to work conflict. [facility] is unable to schedule/reschedule pharmacist appointments.   CB# [phone]

## 2023-06-12 ENCOUNTER — Other Ambulatory Visit: Payer: Federal, State, Local not specified - PPO

## 2023-06-13 ENCOUNTER — Other Ambulatory Visit: Payer: Federal, State, Local not specified - PPO

## 2023-06-13 NOTE — Progress Notes (Signed)
   06/13/2023 Name: Judy Cook MRN: [id number] DOB: 02-15-2001  Chief Complaint  Patient presents with   True [place] Metric Diabetes    Judy Cook is a 23 y.o. year old female who presented for a telephone visit.   They were referred to the pharmacist by a quality report for assistance in managing diabetes.   Subjective:  Care Team: Primary Care Provider: Elenore Paddy, NP ; Next Scheduled Visit: 08/31/23 Endocrinologist Dr. Lonzo Cloud; Next Scheduled Visit: 08/15/23  Medication Access/Adherence  Current Pharmacy:  [id number] [place], [place] - [address] AT [facility] [address] [place] [address] Phone: [phone] Fax: [phone]   Patient reports affordability concerns with their medications: No  Patient reports access/transportation concerns to their pharmacy: No  Patient reports adherence concerns with their medications:  No     Diabetes:  Current medications: Insulin pump   Current glucose readings: reports 14 day BG average 195, 30 day average 198 per Dexcom. No lows reported.   Patient denies hypoglycemic s/sx including dizziness, shakiness, sweating.   Current meal patterns: pt reports she has been trying a new diet & eating pescatarian.  Current physical activity: she does lifting at work, tries to walk regularly   Objective:  Lab Results  Component Value Date   HGBA1C 8.2 (A) 04/13/2023    Lab Results  Component Value Date   CREATININE 0.96 05/04/2023   BUN 11 05/04/2023   NA 136 05/04/2023   K 3.8 05/04/2023   CL 100 05/04/2023   CO2 27 05/04/2023    Lab Results  Component Value Date   CHOL 165 06/02/2021   HDL 59.20 06/02/2021   LDLCALC 88 06/02/2021   TRIG 89.0 06/02/2021   CHOLHDL 3 06/02/2021    Medications Reviewed Today   Medications were not reviewed in this encounter       Assessment/Plan:   Diabetes: - Currently uncontrolled, A1c goal <7% - Reviewed goal  A1c, goal fasting, and goal 2 hour post prandial glucose - Reviewed dietary modifications including increasing protein/fiber in diet, balanced meals/snack - Reviewed lifestyle modifications including: weight training and 150 min of moderate intensity exercise per week - Recommend to continue follow up with endo for insulin pump adjustments. Encouraged focus on lifestyle to aid in BG control.    Follow Up Plan: PRN, 4/23 endo appt  Arbutus Leas, PharmD, BCPS, CPP Clinical Pharmacist Practitioner [facility] [phone]

## 2023-06-13 NOTE — Patient Instructions (Signed)
It was a pleasure speaking with you today!  Continue working on diet with a focus on increasing protein and fiber with every meal/snack. Strive for 150 minutes of moderate intensity exercise per week.  Feel free to call with any questions or concerns!  Arbutus Leas, PharmD, BCPS, CPP Clinical Pharmacist Practitioner [facility] at [facility] [phone]

## 2023-06-29 ENCOUNTER — Emergency Department (HOSPITAL_BASED_OUTPATIENT_CLINIC_OR_DEPARTMENT_OTHER)
Admission: EM | Admit: 2023-06-29 | Discharge: 2023-06-29 | Disposition: A | Discharge status: Home / Self Care | Attending: Emergency Medicine | Admitting: Emergency Medicine

## 2023-06-29 ENCOUNTER — Encounter (HOSPITAL_BASED_OUTPATIENT_CLINIC_OR_DEPARTMENT_OTHER): Payer: Self-pay | Admitting: Emergency Medicine

## 2023-06-29 ENCOUNTER — Other Ambulatory Visit: Payer: Self-pay

## 2023-06-29 DIAGNOSIS — E1165 Type 2 diabetes mellitus with hyperglycemia: Secondary | ICD-10-CM | POA: Diagnosis not present

## 2023-06-29 DIAGNOSIS — R197 Diarrhea, unspecified: Secondary | ICD-10-CM | POA: Diagnosis not present

## 2023-06-29 DIAGNOSIS — R1084 Generalized abdominal pain: Secondary | ICD-10-CM | POA: Diagnosis not present

## 2023-06-29 DIAGNOSIS — R739 Hyperglycemia, unspecified: Secondary | ICD-10-CM

## 2023-06-29 DIAGNOSIS — R109 Unspecified abdominal pain: Secondary | ICD-10-CM | POA: Diagnosis not present

## 2023-06-29 DIAGNOSIS — Z794 Long term (current) use of insulin: Secondary | ICD-10-CM | POA: Diagnosis not present

## 2023-06-29 DIAGNOSIS — E1065 Type 1 diabetes mellitus with hyperglycemia: Secondary | ICD-10-CM | POA: Diagnosis not present

## 2023-06-29 DIAGNOSIS — R110 Nausea: Secondary | ICD-10-CM | POA: Diagnosis not present

## 2023-06-29 LAB — URINALYSIS, ROUTINE W REFLEX MICROSCOPIC
Bilirubin Urine: NEGATIVE
Glucose, UA: 250 mg/dL — AB
Hgb urine dipstick: NEGATIVE
Ketones, ur: NEGATIVE mg/dL
Leukocytes,Ua: NEGATIVE
Nitrite: NEGATIVE
Protein, ur: 100 mg/dL — AB
Specific Gravity, Urine: 1.02 (ref 1.005–1.030)
pH: >=9 (ref 5.0–8.0)

## 2023-06-29 LAB — COMPREHENSIVE METABOLIC PANEL
ALT: 18 U/L (ref 0–44)
AST: 19 U/L (ref 15–41)
Albumin: 4.6 g/dL (ref 3.5–5.0)
Alkaline Phosphatase: 67 U/L (ref 38–126)
Anion gap: 8 (ref 5–15)
BUN: 7 mg/dL (ref 6–20)
CO2: 25 mmol/L (ref 22–32)
Calcium: 9.5 mg/dL (ref 8.9–10.3)
Chloride: 103 mmol/L (ref 98–111)
Creatinine, Ser: 0.78 mg/dL (ref 0.44–1.00)
GFR, Estimated: >60 mL/min (ref >60)
Glucose, Bld: 194 mg/dL — ABNORMAL HIGH (ref 70–99)
Potassium: 3.6 mmol/L (ref 3.5–5.1)
Sodium: 136 mmol/L (ref 135–145)
Total Bilirubin: 0.5 mg/dL (ref 0.0–1.2)
Total Protein: 8.2 g/dL — ABNORMAL HIGH (ref 6.5–8.1)

## 2023-06-29 LAB — CBC WITH DIFFERENTIAL/PLATELET
Abs Immature Granulocytes: 0.04 10*3/uL (ref 0.00–0.07)
Basophils Absolute: 0 10*3/uL (ref 0.0–0.1)
Basophils Relative: 0 %
Eosinophils Absolute: 0.1 10*3/uL (ref 0.0–0.5)
Eosinophils Relative: 1 %
HCT: 42.3 % (ref 36.0–46.0)
Hemoglobin: 14.3 g/dL (ref 12.0–15.0)
Immature Granulocytes: 1 %
Lymphocytes Relative: 12 %
Lymphs Abs: 1 10*3/uL (ref 0.7–4.0)
MCH: 31.6 pg (ref 26.0–34.0)
MCHC: 33.8 g/dL (ref 30.0–36.0)
MCV: 93.4 fL (ref 80.0–100.0)
Monocytes Absolute: 0.4 10*3/uL (ref 0.1–1.0)
Monocytes Relative: 5 %
Neutro Abs: 7.2 10*3/uL (ref 1.7–7.7)
Neutrophils Relative %: 81 %
Platelets: 423 10*3/uL — ABNORMAL HIGH (ref 150–400)
RBC: 4.53 MIL/uL (ref 3.87–5.11)
RDW: 12.6 % (ref 11.5–15.5)
WBC: 8.8 10*3/uL (ref 4.0–10.5)
nRBC: 0 % (ref 0.0–0.2)

## 2023-06-29 LAB — RESP PANEL BY RT-PCR (RSV, FLU A&B, COVID)  RVPGX2
Influenza A by PCR: NEGATIVE
Influenza B by PCR: NEGATIVE
Resp Syncytial Virus by PCR: NEGATIVE
SARS Coronavirus 2 by RT PCR: NEGATIVE

## 2023-06-29 LAB — URINALYSIS, MICROSCOPIC (REFLEX)

## 2023-06-29 LAB — PREGNANCY, URINE: Preg Test, Ur: NEGATIVE

## 2023-06-29 LAB — CBG MONITORING, ED: Glucose-Capillary: 142 mg/dL — ABNORMAL HIGH (ref 70–99)

## 2023-06-29 MED ORDER — INSULIN ASPART 100 UNIT/ML IJ SOLN
4.0000 [IU] | Freq: Once | INTRAMUSCULAR | Status: AC
  Administered 2023-06-29: 4 [IU] via SUBCUTANEOUS

## 2023-06-29 MED ORDER — ONDANSETRON HCL 4 MG/2ML IJ SOLN
4.0000 mg | Freq: Once | INTRAMUSCULAR | Status: DC
  Filled 2023-06-29: qty 2

## 2023-06-29 NOTE — Inpatient Diabetes Management (Signed)
 Inpatient Diabetes Program Recommendations  AACE/ADA: New Consensus Statement on Inpatient Glycemic Control (2015)  Target Ranges:  Prepandial:   less than 140 mg/dL      Peak postprandial:   less than 180 mg/dL (1-2 hours)      Critically ill patients:  140 - 180 mg/dL   Lab Results  Component Value Date   GLUCAP 142 (H) 06/29/2023   HGBA1C 8.2 (A) 04/13/2023   Spoke with patient by phone. She states that she does not have charger for insulin pump and therefore it is off.  Her blood sugar per her Dexcom sensor is 275 mg/dL.  Consider giving one time dose of Novolog 4 units to correct current blood sugar.  Patient states that she does not think she will be admitted.  Encouraged her to charge pump as soon as she gets home and resume to prevent DKA.  If patient to be admitted, will need basal bolus insulin as recommended by DM coordinator.   Thanks,  Lorenza Cambridge, RN, BC-ADM Inpatient Diabetes Coordinator Pager [phone]  (8a-5p)

## 2023-06-29 NOTE — ED Provider Notes (Signed)
 [facility] Provider Note   CSN: [id number] Arrival date & time: 06/29/23  1212     History  Chief Complaint  Patient presents with   Abdominal Pain    Judy Cook is a 23 y.o. female.  Patient complains of having had an episode of abdominal pain earlier today.  Patient states she is currently pain-free.  Patient reports that she has had difficulty recently with managing her diabetes.  Patient reports that she has an insulin pump which she has removed because the battery was low.  Patient reports her glucose was high earlier today and then after a bolus her glucose was low.  Patient reports that she is experiencing highs and lows with her insulin pump.  Patient shows me her Dexcom.  Patient reports she is not currently having any nausea vomiting or diarrhea.  Patient reports a history of gastroparesis and thinks that maybe the pain that she had was related to that.  The history is provided by the patient. No language interpreter was used.  Abdominal Pain      Home Medications Prior to Admission medications   Medication Sig Start Date End Date Taking? Authorizing Provider  Continuous Glucose Sensor (DEXCOM G7 SENSOR) MISC Change every 10 days 11/27/22   Shamleffer, Konrad Dolores, MD  fluconazole (DIFLUCAN) 150 MG tablet Take 1 tablet (150 mg total) by mouth daily. 05/08/23   Elenore Paddy, NP  ibuprofen (ADVIL) 200 MG tablet Take 200 mg by mouth as needed for moderate pain.    [provider]  insulin aspart (NOVOLOG) 100 UNIT/ML injection Max Daily 60 units per pump Patient taking differently: Inject 10 Units into the skin 3 (three) times daily with meals. Via pump Pump is also injecting per sliding scale per pt 06/08/22   Shamleffer, Konrad Dolores, MD  metoCLOPramide (REGLAN) 5 MG tablet Take 1 tablet (5 mg total) by mouth 3 (three) times daily. 12/21/22 06/19/23  Celso Amy, PA-C  metroNIDAZOLE (METROGEL) 0.75 %  vaginal gel Apply 1 application vaginally once a day at night for 5 days 05/08/23   Elenore Paddy, NP  Multiple Vitamins-Minerals (MULTIVITAMIN WITH MINERALS) tablet Take 2 tablets by mouth daily.    [provider]  naproxen sodium (ALEVE) 220 MG tablet Take 440 mg by mouth daily as needed (pain).    [provider]  norethindrone (MICRONOR) 0.35 MG tablet Take 1 tablet (0.35 mg total) by mouth daily. 01/31/23   Jerene Bears, MD  promethazine (PHENERGAN) 25 MG suppository Place 1 suppository (25 mg total) rectally every 8 (eight) hours as needed for nausea or vomiting. 01/18/23   Schutt, Edsel Petrin, PA-C      Allergies    Patient has no known allergies.    Review of Systems   Review of Systems  Gastrointestinal:  Positive for abdominal pain.  All other systems reviewed and are negative.   Physical Exam Updated Vital Signs BP 127/81   Pulse 81   Temp 98.2 F (36.8 C)   Resp 16   Ht 5' (1.524 m)   Wt 65.8 kg   LMP 06/13/2023   SpO2 100%   BMI 28.32 kg/m  Physical Exam Vitals and nursing note reviewed.  Constitutional:      Appearance: She is well-developed.  HENT:     Head: Normocephalic.     Mouth/Throat:     Mouth: Mucous membranes are moist.  Eyes:     Extraocular Movements: Extraocular movements intact.  Cardiovascular:     Rate and Rhythm: Normal rate and regular rhythm.  Pulmonary:     Effort: Pulmonary effort is normal.  Abdominal:     General: Bowel sounds are normal. There is no distension.     Palpations: Abdomen is soft.     Tenderness: There is no abdominal tenderness.     Hernia: No hernia is present.  Musculoskeletal:        General: Normal range of motion.     Cervical back: Normal range of motion.  Skin:    General: Skin is warm.  Neurological:     General: No focal deficit present.     Mental Status: She is alert and oriented to person, place, and time.  Psychiatric:        Mood and Affect: Mood normal.     ED Results /  Procedures / Treatments   Labs (all labs ordered are listed, but only abnormal results are displayed) Labs Reviewed  CBC WITH DIFFERENTIAL/PLATELET - Abnormal; Notable for the following components:      Result Value   Platelets 423 (*)    All other components within normal limits  COMPREHENSIVE METABOLIC PANEL - Abnormal; Notable for the following components:   Glucose, Bld 194 (*)    Total Protein 8.2 (*)    All other components within normal limits  URINALYSIS, ROUTINE W REFLEX MICROSCOPIC - Abnormal; Notable for the following components:   APPearance HAZY (*)    Glucose, UA 250 (*)    Protein, ur 100 (*)    All other components within normal limits  URINALYSIS, MICROSCOPIC (REFLEX) - Abnormal; Notable for the following components:   Bacteria, UA RARE (*)    All other components within normal limits  CBG MONITORING, ED - Abnormal; Notable for the following components:   Glucose-Capillary 142 (*)    All other components within normal limits  RESP PANEL BY RT-PCR (RSV, FLU A&B, COVID)  RVPGX2  PREGNANCY, URINE    EKG None  Radiology No results found.  Procedures Procedures    Medications Ordered in ED Medications - No data to display  ED Course/ Medical Decision Making/ A&P                                 Medical Decision Making Pt complains of abdominal cramping earlier today, no resolved.  Pt reports glucose was low earlier today and then elevated.  Pt took pup off because battery was low   Amount and/or Complexity of Data Reviewed Labs: ordered. Decision-making details documented in ED Course.    Details: Labs ordered reviewed and interpreted.    Risk Prescription drug management. Risk Details: Pt not vomiting, no current pain,  no evidence of dka.             Final Clinical Impression(s) / ED Diagnoses Final diagnoses:  Generalized abdominal pain  Hyperglycemia    Rx / DC Orders ED Discharge Orders     None      An After Visit Summary  was printed and given to the patient.    Elson Areas, PA-C 06/29/23 2247    Rolan Bucco, MD 07/05/23 [phone]

## 2023-06-29 NOTE — ED Triage Notes (Addendum)
 Pt brought in by EMS due to severe abdominal pain. Pain has been occurring x 1 week, but had episode while at work today. Nausea, no vomiting. One episode of diarrhea yesterday. Type one diabetic Blood sugar 94 with EMS, then felt like dropping  sensor reading 64. Apple juice given

## 2023-06-29 NOTE — ED Triage Notes (Addendum)
 Interittent abdominal pain with headache and slight nausea this am.  Pt is type 1 DM, is on pump.  Blood sugar 258 and has been waxing and waning.  No known fever.

## 2023-06-29 NOTE — ED Notes (Signed)
 ED Provider at bedside.

## 2023-06-29 NOTE — Inpatient Diabetes Management (Addendum)
 Inpatient Diabetes Program Recommendations  AACE/ADA: New Consensus Statement on Inpatient Glycemic Control (2015)  Target Ranges:  Prepandial:   less than 140 mg/dL      Peak postprandial:   less than 180 mg/dL (1-2 hours)      Critically ill patients:  140 - 180 mg/dL   Lab Results  Component Value Date   GLUCAP 142 (H) 06/29/2023   HGBA1C 8.2 (A) 04/13/2023    Review of Glycemic Control  Diabetes history: type 2 Outpatient Diabetes medications: Tandem insulin pump Current orders for Inpatient glycemic control: none yet  Inpatient Diabetes Program Recommendations:   Noted insulin pump settings from Dr. Harvel Ricks note on 04/13/23:  Basal: 0.8 units/hr  Carb coverage: 6 units with a meal, 3 units with a snack Sensitivity: 45 Goal blood sugar: 120 mg/dl.   Average daily basal: 19.13 units   If patient is to be admitted to the hospital and needs to be on SQ insulin and not able to use the insulin pump:  Recommend:  Lantus 18 units daily,  Novolog 0-9 units correction scale TID, Novolog 0-5 units HS scale  Novolog 3 units TID with meals.   Titrate dosages as needed.   Smith Mince RN BSN CDE Diabetes Coordinator Pager: [phone]  8am-5pm

## 2023-06-29 NOTE — Discharge Instructions (Addendum)
 Schedule follow up with your endocrinologist.   Return if any problems.

## 2023-07-03 DIAGNOSIS — E1065 Type 1 diabetes mellitus with hyperglycemia: Secondary | ICD-10-CM | POA: Diagnosis not present

## 2023-07-05 ENCOUNTER — Telehealth: Payer: Self-pay

## 2023-07-05 NOTE — Telephone Encounter (Signed)
 Patient was identified as falling into the True North Measure - Diabetes.   Patient was: Appointment scheduled for lab or office visit for A1c. Is on a 6 month schedule, is not due for a A1c check until June.

## 2023-07-10 ENCOUNTER — Other Ambulatory Visit (HOSPITAL_BASED_OUTPATIENT_CLINIC_OR_DEPARTMENT_OTHER): Payer: Self-pay | Admitting: Obstetrics & Gynecology

## 2023-07-10 ENCOUNTER — Other Ambulatory Visit: Payer: Self-pay | Admitting: Internal Medicine

## 2023-07-23 ENCOUNTER — Ambulatory Visit
Admission: RE | Admit: 2023-07-23 | Discharge: 2023-07-23 | Disposition: A | Discharge status: Home / Self Care | Source: Ambulatory Visit | Attending: Emergency Medicine | Admitting: Emergency Medicine

## 2023-07-23 VITALS — BP 148/86 | HR 62 | Temp 98.2°F | Resp 18

## 2023-07-23 DIAGNOSIS — R109 Unspecified abdominal pain: Secondary | ICD-10-CM

## 2023-07-23 DIAGNOSIS — R809 Proteinuria, unspecified: Secondary | ICD-10-CM | POA: Diagnosis not present

## 2023-07-23 DIAGNOSIS — R1084 Generalized abdominal pain: Secondary | ICD-10-CM

## 2023-07-23 LAB — POCT URINALYSIS DIP (MANUAL ENTRY)
Bilirubin, UA: NEGATIVE
Blood, UA: NEGATIVE
Glucose, UA: NEGATIVE mg/dL
Ketones, POC UA: NEGATIVE mg/dL
Leukocytes, UA: NEGATIVE
Nitrite, UA: NEGATIVE
Protein Ur, POC: 30 mg/dL — AB
Spec Grav, UA: 1.025 (ref 1.010–1.025)
Urobilinogen, UA: 0.2 U/dL
pH, UA: 7.5 (ref 5.0–8.0)

## 2023-07-23 LAB — POCT URINE PREGNANCY: Preg Test, Ur: NEGATIVE

## 2023-07-23 NOTE — ED Triage Notes (Signed)
 Abdominal Pain Possible pregnancy - Entered by patient    Pt states she has had symptoms x 4 days. Pt also states she took a Plan B on Wednesday of last week. Pt denies emesis.

## 2023-07-23 NOTE — ED Provider Notes (Signed)
 [facility]    CSN: [id number] Arrival date & time: 07/23/23  1126      History   Chief Complaint Chief Complaint  Patient presents with   Abdominal Pain    Possible pregnancy - Entered by patient    HPI Judy Cook is a 23 y.o. female.   23 year old female, Judy Cook, presents to urgent care for evaluation of abdominal pain x 4 days.  Took Plan B 6 days prior. Pt denies emesis, no diarrhea, no fever, taking po's without issue, voiding well.  The history is provided by the patient. No language interpreter was used.    Past Medical History:  Diagnosis Date   Allergic rhinitis 07/07/2021   COVID 2020   mild   Diabetes mellitus (HCC)    Type 1, type 5   Dysmenorrhea 02/20/2019   GERD (gastroesophageal reflux disease)    Headache    History of herpes genitalis 06/24/2021   Intractable vomiting    SIRS (systemic inflammatory response syndrome) (HCC) 12/03/2019    Patient Active Problem List   Diagnosis Date Noted   Generalized abdominal pain 07/23/2023   Proteinuria 07/23/2023   Encounter for pregnancy test, result unknown 05/04/2023   Screening examination for STD (sexually transmitted disease) 05/04/2023   Polysubstance use disorder 02/01/2023   Intractable nausea and vomiting 12/28/2022   Gastroparesis 12/28/2022   GERD with esophagitis 12/28/2022   Cough 12/28/2022   AKI (acute kidney injury) (HCC) 12/28/2022   Thrombocytosis 12/28/2022   Insulin pump titration 11/20/2022   Stress diabetes 11/20/2022   Type 1 diabetes mellitus with hyperglycemia (HCC) 11/20/2022   DKA (diabetic ketoacidosis) (HCC) 11/06/2022   Hospital discharge follow-up 11/02/2022   DKA, type 1, not at goal [facility]) 11/02/2022   DKA, type 1 (HCC) 10/14/2022   Leukocytosis 10/14/2022   Hypocalcemia 10/14/2022   Hyponatremia 10/14/2022   Left lower quadrant abdominal pain 10/14/2022   GERD (gastroesophageal reflux disease) 10/14/2022   Colitis  09/21/2022   H/O diabetic ketoacidosis 09/04/2022   Rh negative state in antepartum period 07/05/2022   Constipation 07/07/2021   Marijuana use 02/20/2019   Cyclical vomiting 02/20/2019   Hypokalemia 02/20/2019   Hyperglycemia due to type 1 diabetes mellitus (HCC) 02/19/2019   Type 1 diabetes mellitus without complication (HCC) 02/26/2012    Past Surgical History:  Procedure Laterality Date   DILATION AND EVACUATION N/A 07/05/2022   Procedure: DILATATION AND EVACUATION;  Surgeon: Hohenwald Bing, MD;  Location: [facility] OR;  Service: Gynecology;  Laterality: N/A;   OPERATIVE ULTRASOUND N/A 07/05/2022   Procedure: OPERATIVE ULTRASOUND;  Surgeon: Oakdale Bing, MD;  Location: [facility] OR;  Service: Gynecology;  Laterality: N/A;    OB History     Gravida  1   Para  0   Term  0   Preterm  0   AB  1   Living  0      SAB  0   IAB  1   Ectopic  0   Multiple  0   Live Births  0            Home Medications    Prior to Admission medications   Medication Sig Start Date End Date Taking? Authorizing Provider  Continuous Glucose Sensor (DEXCOM G7 SENSOR) MISC Change every 10 days 11/27/22   Shamleffer, Konrad Dolores, MD  fluconazole (DIFLUCAN) 150 MG tablet Take 1 tablet (150 mg total) by mouth daily. 05/08/23   Elenore Paddy, NP  ibuprofen (ADVIL) 200  MG tablet Take 200 mg by mouth as needed for moderate pain.   Yes [provider]  JUNEL FE 1/20 1-20 MG-MCG tablet TAKE 1 TABLET BY MOUTH EVERY DAY 07/10/23   Jerene Bears, MD  metoCLOPramide (REGLAN) 5 MG tablet Take 1 tablet (5 mg total) by mouth 3 (three) times daily. 12/21/22 06/19/23  Celso Amy, PA-C  metroNIDAZOLE (METROGEL) 0.75 % vaginal gel Apply 1 application vaginally once a day at night for 5 days 05/08/23   Elenore Paddy, NP  Multiple Vitamins-Minerals (MULTIVITAMIN WITH MINERALS) tablet Take 2 tablets by mouth daily.    [provider]  naproxen sodium (ALEVE) 220 MG tablet Take 440 mg by mouth  daily as needed (pain).    [provider]  norethindrone (MICRONOR) 0.35 MG tablet Take 1 tablet (0.35 mg total) by mouth daily. 01/31/23   Jerene Bears, MD  NOVOLOG 100 UNIT/ML injection MAX DAILY 60 UNITS PER PUMP 07/10/23  Yes Shamleffer, Konrad Dolores, MD  promethazine (PHENERGAN) 25 MG suppository Place 1 suppository (25 mg total) rectally every 8 (eight) hours as needed for nausea or vomiting. 01/18/23   Schutt, Edsel Petrin, PA-C    Family History Family History  Problem Relation Age of Onset   Diabetes Paternal Grandfather    Heart attack Paternal Grandfather    Diabetes Paternal Grandmother    Cancer Maternal Grandmother        lung   Alcohol abuse Maternal Grandfather    Alcohol abuse Father    Drug abuse Father    High Cholesterol Father    Alcohol abuse Mother    Depression Mother    Drug abuse Mother     Social History Social History   Tobacco Use   Smoking status: Some Days    Types: Cigarettes   Smokeless tobacco: Never  Vaping Use   Vaping status: Former   Substances: Nicotine, THC  Substance Use Topics   Alcohol use: Yes    Comment: socially   Drug use: Yes    Types: Marijuana     Allergies   Patient has no known allergies.   Review of Systems Review of Systems  Constitutional:  Negative for fever.  Gastrointestinal:  Positive for abdominal pain. Negative for nausea and vomiting.  Genitourinary:  Negative for dysuria and vaginal discharge.  All other systems reviewed and are negative.    Physical Exam Triage Vital Signs ED Triage Vitals  Encounter Vitals Group     BP 07/23/23 1147 (!) 148/86     Systolic BP Percentile --      Diastolic BP Percentile --      Pulse Rate 07/23/23 1147 62     Resp 07/23/23 1147 18     Temp 07/23/23 1147 98.2 F (36.8 C)     Temp Source 07/23/23 1147 Oral     SpO2 07/23/23 1147 98 %     Weight --      Height --      Head Circumference --      Peak Flow --      Pain Score 07/23/23 1145 0      Pain Loc --      Pain Education --      Exclude from Growth Chart --    No data found.  Updated Vital Signs BP (!) 148/86 (BP Location: Left Arm)   Pulse 62   Temp 98.2 F (36.8 C) (Oral)   Resp 18   LMP 06/13/2023   SpO2  98%   Visual Acuity Right Eye Distance:   Left Eye Distance:   Bilateral Distance:    Right Eye Near:   Left Eye Near:    Bilateral Near:     Physical Exam Vitals and nursing note reviewed.  Constitutional:      General: She is not in acute distress.    Appearance: She is well-developed and well-groomed.  HENT:     Head: Normocephalic and atraumatic.  Eyes:     Conjunctiva/sclera: Conjunctivae normal.  Cardiovascular:     Rate and Rhythm: Normal rate.     Heart sounds: No murmur heard. Pulmonary:     Effort: Pulmonary effort is normal. No respiratory distress.  Abdominal:     Tenderness: There is generalized abdominal tenderness.  Musculoskeletal:        General: No swelling.     Cervical back: Normal range of motion.  Skin:    General: Skin is warm and dry.     Capillary Refill: Capillary refill takes less than 2 seconds.  Neurological:     General: No focal deficit present.     Mental Status: She is alert and oriented to person, place, and time.     GCS: GCS eye subscore is 4. GCS verbal subscore is 5. GCS motor subscore is 6.  Psychiatric:        Attention and Perception: Attention normal.        Mood and Affect: Mood normal.        Speech: Speech normal.        Behavior: Behavior normal. Behavior is cooperative.      UC Treatments / Results  Labs (all labs ordered are listed, but only abnormal results are displayed) Labs Reviewed  POCT URINALYSIS DIP (MANUAL ENTRY) - Abnormal; Notable for the following components:      Result Value   Protein Ur, POC =30 (*)    All other components within normal limits  POCT URINE PREGNANCY - Normal    EKG   Radiology No results found.  Procedures Procedures (including critical care  time)  Medications Ordered in UC Medications - No data to display  Initial Impression / Assessment and Plan / UC Course  I have reviewed the triage vital signs and the nursing notes.  Pertinent labs & imaging results that were available during my care of the patient were reviewed by me and considered in my medical decision making (see chart for details).  Clinical Course as of 07/23/23 1211  Mon Jul 23, 2023  1154 UA: Proteinuria 30, pregnancy test is negative [JD]    Clinical Course User Index [JD] Stormey Wilborn, Para March, NP  Discussed exam findings and plan of care with patient, strict go to ER precautions given.   Patient verbalized understanding to this provider.  Ddx: Abdominal pain, proteinuria, UTI,pregnancy Final Clinical Impressions(s) / UC Diagnoses   Final diagnoses:  Generalized abdominal pain  Proteinuria, unspecified type     Discharge Instructions      Your urine was negative for UTI Your pregnancy test is negative Drink plenty of water If you develop worsening abdominal pain, fever, nausea ,vomiting, chest pain, etc.: Go to the emergency room for further evaluation     ED Prescriptions   None    PDMP not reviewed this encounter.   Clancy Gourd, NP 07/23/23 [phone]

## 2023-07-23 NOTE — Discharge Instructions (Signed)
 Your urine was negative for UTI Your pregnancy test is negative Drink plenty of water If you develop worsening abdominal pain, fever, nausea ,vomiting, chest pain, etc.: Go to the emergency room for further evaluation

## 2023-07-25 ENCOUNTER — Ambulatory Visit
Admission: RE | Admit: 2023-07-25 | Discharge: 2023-07-25 | Disposition: A | Discharge status: Home / Self Care | Source: Ambulatory Visit | Attending: Family Medicine | Admitting: Family Medicine

## 2023-07-25 VITALS — BP 128/77 | HR 76 | Temp 98.3°F | Resp 16

## 2023-07-25 DIAGNOSIS — J069 Acute upper respiratory infection, unspecified: Secondary | ICD-10-CM | POA: Diagnosis not present

## 2023-07-25 DIAGNOSIS — B9689 Other specified bacterial agents as the cause of diseases classified elsewhere: Secondary | ICD-10-CM

## 2023-07-25 LAB — POCT RAPID STREP A (OFFICE): Rapid Strep A Screen: NEGATIVE

## 2023-07-25 LAB — POCT MONO SCREEN (KUC): Mono, POC: NEGATIVE

## 2023-07-25 MED ORDER — PSEUDOEPHEDRINE HCL 60 MG PO TABS: 60.0000 mg | ORAL_TABLET | Freq: Three times a day (TID) | ORAL | 0 refills | Status: DC | PRN

## 2023-07-25 MED ORDER — CETIRIZINE HCL 10 MG PO TABS: 10.0000 mg | ORAL_TABLET | Freq: Every day | ORAL | 0 refills | Status: AC

## 2023-07-25 MED ORDER — AMOXICILLIN 875 MG PO TABS: 875.0000 mg | ORAL_TABLET | Freq: Two times a day (BID) | ORAL | 0 refills | Status: DC

## 2023-07-25 NOTE — ED Provider Notes (Signed)
 [facility]  Note:  This document was prepared using Conservation officer, historic buildings and may include unintentional dictation errors.  MRN: [id number] DOB: 12-28-2000  Subjective:   Judy Cook is a 23 y.o. female presenting for 8-day history of persistent sinus congestion, sinus drainage, coughing, throat pain, lymph node swelling of the right side of her head.  Has also felt fatigue, brain fog.  Smokes marijuana daily.  No asthma.  No chest pain, shortness of breath or wheezing.  Had testing done 2 days ago and was negative for pregnancy.  No current facility-administered medications for this encounter.  Current Outpatient Medications:    Continuous Glucose Sensor (DEXCOM G7 SENSOR) MISC, Change every 10 days, Disp: 9 each, Rfl: 3   fluconazole (DIFLUCAN) 150 MG tablet, Take 1 tablet (150 mg total) by mouth daily., Disp: 1 tablet, Rfl: 0   ibuprofen (ADVIL) 200 MG tablet, Take 200 mg by mouth as needed for moderate pain., Disp: , Rfl:    JUNEL FE 1/20 1-20 MG-MCG tablet, TAKE 1 TABLET BY MOUTH EVERY DAY, Disp: 84 tablet, Rfl: 3   metoCLOPramide (REGLAN) 5 MG tablet, Take 1 tablet (5 mg total) by mouth 3 (three) times daily., Disp: 90 tablet, Rfl: 5   metroNIDAZOLE (METROGEL) 0.75 % vaginal gel, Apply 1 application vaginally once a day at night for 5 days, Disp: 70 g, Rfl: 0   Multiple Vitamins-Minerals (MULTIVITAMIN WITH MINERALS) tablet, Take 2 tablets by mouth daily., Disp: , Rfl:    naproxen sodium (ALEVE) 220 MG tablet, Take 440 mg by mouth daily as needed (pain)., Disp: , Rfl:    norethindrone (MICRONOR) 0.35 MG tablet, Take 1 tablet (0.35 mg total) by mouth daily., Disp: 84 tablet, Rfl: 1   NOVOLOG 100 UNIT/ML injection, MAX DAILY 60 UNITS PER PUMP, Disp: 60 mL, Rfl: 11   promethazine (PHENERGAN) 25 MG suppository, Place 1 suppository (25 mg total) rectally every 8 (eight) hours as needed for nausea or vomiting., Disp: 12 each, Rfl: 0   No Known  Allergies  Past Medical History:  Diagnosis Date   Allergic rhinitis 07/07/2021   COVID 2020   mild   Diabetes mellitus (HCC)    Type 1, type 5   Dysmenorrhea 02/20/2019   GERD (gastroesophageal reflux disease)    Headache    History of herpes genitalis 06/24/2021   Intractable vomiting    SIRS (systemic inflammatory response syndrome) (HCC) 12/03/2019     Past Surgical History:  Procedure Laterality Date   DILATION AND EVACUATION N/A 07/05/2022   Procedure: DILATATION AND EVACUATION;  Surgeon: [place] Bing, MD;  Location: [facility] OR;  Service: Gynecology;  Laterality: N/A;   OPERATIVE ULTRASOUND N/A 07/05/2022   Procedure: OPERATIVE ULTRASOUND;  Surgeon: [place] Bing, MD;  Location: [facility] OR;  Service: Gynecology;  Laterality: N/A;    Family History  Problem Relation Age of Onset   Diabetes Paternal Grandfather    Heart attack Paternal Grandfather    Diabetes Paternal Grandmother    Cancer Maternal Grandmother        lung   Alcohol abuse Maternal Grandfather    Alcohol abuse Father    Drug abuse Father    High Cholesterol Father    Alcohol abuse Mother    Depression Mother    Drug abuse Mother     Social History   Tobacco Use   Smoking status: Former    Types: Cigarettes   Smokeless tobacco: Never  Vaping Use   Vaping status:  Every Day   Substances: Nicotine, THC  Substance Use Topics   Alcohol use: Yes    Comment: socially   Drug use: Yes    Types: Marijuana    ROS   Objective:   Vitals: BP 128/77 (BP Location: Right Arm)   Pulse 76   Temp 98.3 F (36.8 C) (Oral)   Resp 16   LMP 06/13/2023 Comment: reports neg preg test 3/31 at [facility]  SpO2 97%   Physical Exam Constitutional:      General: She is not in acute distress.    Appearance: Normal appearance. She is well-developed and normal weight. She is not ill-appearing, toxic-appearing or diaphoretic.  HENT:     Head: Normocephalic and atraumatic.     Right Ear: Tympanic membrane, ear canal and  external ear normal. No drainage or tenderness. No middle ear effusion. There is no impacted cerumen. Tympanic membrane is not erythematous or bulging.     Left Ear: Tympanic membrane, ear canal and external ear normal. No drainage or tenderness.  No middle ear effusion. There is no impacted cerumen. Tympanic membrane is not erythematous or bulging.     Nose: Congestion and rhinorrhea present.     Mouth/Throat:     Mouth: Mucous membranes are moist. No oral lesions.     Pharynx: No pharyngeal swelling, oropharyngeal exudate, posterior oropharyngeal erythema or uvula swelling.     Tonsils: No tonsillar exudate or tonsillar abscesses.  Eyes:     General: No scleral icterus.       Right eye: No discharge.        Left eye: No discharge.     Extraocular Movements: Extraocular movements intact.     Right eye: Normal extraocular motion.     Left eye: Normal extraocular motion.     Conjunctiva/sclera: Conjunctivae normal.  Cardiovascular:     Rate and Rhythm: Normal rate and regular rhythm.     Heart sounds: Normal heart sounds. No murmur heard.    No friction rub. No gallop.  Pulmonary:     Effort: Pulmonary effort is normal. No respiratory distress.     Breath sounds: No stridor. No wheezing, rhonchi or rales.  Chest:     Chest wall: No tenderness.  Musculoskeletal:     Cervical back: Normal range of motion and neck supple.  Lymphadenopathy:     Cervical: No cervical adenopathy.  Skin:    General: Skin is warm and dry.  Neurological:     General: No focal deficit present.     Mental Status: She is alert and oriented to person, place, and time.     Cranial Nerves: No cranial nerve deficit.     Motor: No weakness.     Coordination: Coordination normal.     Gait: Gait normal.  Psychiatric:        Mood and Affect: Mood normal.        Behavior: Behavior normal.     Results for orders placed or performed during the hospital encounter of 07/25/23 (from the past 24 hours)  POCT rapid  strep A     Status: None   Collection Time: 07/25/23  9:31 AM  Result Value Ref Range   Rapid Strep A Screen Negative Negative  POCT mono screen     Status: None   Collection Time: 07/25/23  9:40 AM  Result Value Ref Range   Mono, POC Negative Negative    Assessment and Plan :   PDMP not reviewed this encounter.  1.  Bacterial upper respiratory infection    Will manage for bacterial upper respiratory infection given timeline of illness, being immunocompromise with her poorly controlled diabetes.  Recommend amoxicillin, supportive care.  Deferred imaging given clear cardiopulmonary exam, hemodynamically stable vital signs.  Counseled patient on potential for adverse effects with medications prescribed/recommended today, ER and return-to-clinic precautions discussed, patient verbalized understanding.    Wallis Bamberg, [place] 07/25/23 [phone]

## 2023-07-25 NOTE — Discharge Instructions (Signed)
We will manage this as a sinus infection with amoxicillin. For sore throat or cough try using a honey-based tea. Use 3 teaspoons of honey with juice squeezed from half lemon. Place shaved pieces of ginger into 1/2-1 cup of water and warm over stove top. Then mix the ingredients and repeat every 4 hours as needed. Please take ibuprofen 600mg every 6 hours with food alternating with OR taken together with Tylenol 500mg-650mg every 6 hours for throat pain, fevers, aches and pains. Hydrate very well with at least 2 liters of water. Eat light meals such as soups (chicken and noodles, vegetable, chicken and wild rice).  Do not eat foods that you are allergic to.  Taking an antihistamine like Zyrtec can help against postnasal drainage, sinus congestion which can cause sinus pain, sinus headaches, throat pain, painful swallowing, coughing.  You can take this together with pseudoephedrine (Sudafed) at a dose of 60 mg 3 times a day or twice daily as needed for the same kind of nasal drip, congestion.  Use cough medication as needed.   

## 2023-07-25 NOTE — ED Triage Notes (Addendum)
 Pt c/o sore throat, lymph node swelling to right side of neck, "brain fog"-rash to LUE (denies at present)-sx started 8 days ago-NAD-steady gait-chart reads pt [facility] at [facility]-states she was seen for abd pain and did not report any of the sx c/o that she presents with today that started 8 days ago-states she went there due to she thought she was pregnant

## 2023-08-02 ENCOUNTER — Encounter (HOSPITAL_BASED_OUTPATIENT_CLINIC_OR_DEPARTMENT_OTHER): Payer: Self-pay | Admitting: Obstetrics & Gynecology

## 2023-08-02 ENCOUNTER — Other Ambulatory Visit (HOSPITAL_COMMUNITY)
Admission: RE | Admit: 2023-08-02 | Discharge: 2023-08-02 | Disposition: A | Discharge status: Home / Self Care | Source: Ambulatory Visit | Attending: Obstetrics & Gynecology | Admitting: Obstetrics & Gynecology

## 2023-08-02 ENCOUNTER — Ambulatory Visit (HOSPITAL_BASED_OUTPATIENT_CLINIC_OR_DEPARTMENT_OTHER): Admitting: Obstetrics & Gynecology

## 2023-08-02 VITALS — BP 140/97 | HR 67 | Ht 60.0 in | Wt 158.0 lb

## 2023-08-02 DIAGNOSIS — R102 Pelvic and perineal pain: Secondary | ICD-10-CM

## 2023-08-02 DIAGNOSIS — N898 Other specified noninflammatory disorders of vagina: Secondary | ICD-10-CM

## 2023-08-02 DIAGNOSIS — R1032 Left lower quadrant pain: Secondary | ICD-10-CM | POA: Diagnosis not present

## 2023-08-02 DIAGNOSIS — E1065 Type 1 diabetes mellitus with hyperglycemia: Secondary | ICD-10-CM | POA: Diagnosis not present

## 2023-08-02 DIAGNOSIS — R109 Unspecified abdominal pain: Secondary | ICD-10-CM | POA: Diagnosis not present

## 2023-08-02 DIAGNOSIS — Z3041 Encounter for surveillance of contraceptive pills: Secondary | ICD-10-CM

## 2023-08-02 DIAGNOSIS — R030 Elevated blood-pressure reading, without diagnosis of hypertension: Secondary | ICD-10-CM

## 2023-08-02 LAB — POCT URINALYSIS DIPSTICK
Bilirubin, UA: NEGATIVE
Blood, UA: NEGATIVE
Glucose, UA: NEGATIVE
Ketones, UA: NEGATIVE
Leukocytes, UA: NEGATIVE
Nitrite, UA: NEGATIVE
Protein, UA: NEGATIVE
Spec Grav, UA: 1.02 (ref 1.010–1.025)
Urobilinogen, UA: 0.2 U/dL
pH, UA: 7 (ref 5.0–8.0)

## 2023-08-02 NOTE — Progress Notes (Signed)
 GYNECOLOGY  VISIT  CC:   lower pelvic pain  HPI: 23 y.o. G1P0010 [name] here for complaint of lower pelvic pain that has been present about a week.  Reports last cycle was 4/3 and lasted for 7 days.  This was longer and heavier.  She took the plan B 06/21/2023.  Not interested in other contraception options at all.  Discussed today.  Is wondering about possible UTI.  On augmentin for upper respiratory infection.  Discussed likely coverage with urinary bacteria as well.  Having some vaginal discharge.  Is SA.  Ok with STD testing.     Past Medical History:  Diagnosis Date   Allergic rhinitis 07/07/2021   COVID 2020   mild   Diabetes mellitus (HCC)    Type 1, type 5   Dysmenorrhea 02/20/2019   GERD (gastroesophageal reflux disease)    Headache    History of herpes genitalis 06/24/2021   Intractable vomiting    SIRS (systemic inflammatory response syndrome) (HCC) 12/03/2019    MEDS:   Current Outpatient Medications on File Prior to Visit  Medication Sig Dispense Refill   amoxicillin (AMOXIL) 875 MG tablet Take 1 tablet (875 mg total) by mouth 2 (two) times daily. 14 tablet 0   cetirizine (ZYRTEC ALLERGY) 10 MG tablet Take 1 tablet (10 mg total) by mouth daily. 30 tablet 0   Continuous Glucose Sensor (DEXCOM G7 SENSOR) MISC Change every 10 days 9 each 3   ibuprofen (ADVIL) 200 MG tablet Take 200 mg by mouth as needed for moderate pain.     JUNEL FE 1/20 1-20 MG-MCG tablet TAKE 1 TABLET BY MOUTH EVERY DAY 84 tablet 3   Multiple Vitamins-Minerals (MULTIVITAMIN WITH MINERALS) tablet Take 2 tablets by mouth daily.     naproxen sodium (ALEVE) 220 MG tablet Take 440 mg by mouth daily as needed (pain).     NOVOLOG 100 UNIT/ML injection MAX DAILY 60 UNITS PER PUMP 60 mL 11   pseudoephedrine (SUDAFED) 60 MG tablet Take 1 tablet (60 mg total) by mouth every 8 (eight) hours as needed for congestion. 30 tablet 0   fluconazole (DIFLUCAN) 150 MG tablet Take 1 tablet  (150 mg total) by mouth daily. (Patient not taking: Reported on 08/02/2023) 1 tablet 0   metoCLOPramide (REGLAN) 5 MG tablet Take 1 tablet (5 mg total) by mouth 3 (three) times daily. 90 tablet 5   metroNIDAZOLE (METROGEL) 0.75 % vaginal gel Apply 1 application vaginally once a day at night for 5 days (Patient not taking: Reported on 08/02/2023) 70 g 0   norethindrone (MICRONOR) 0.35 MG tablet Take 1 tablet (0.35 mg total) by mouth daily. (Patient not taking: Reported on 08/02/2023) 84 tablet 1   promethazine (PHENERGAN) 25 MG suppository Place 1 suppository (25 mg total) rectally every 8 (eight) hours as needed for nausea or vomiting. (Patient not taking: Reported on 08/02/2023) 12 each 0   No current facility-administered medications on file prior to visit.    ALLERGIES: Patient has no known allergies.  SH:  single, has partner  Review of Systems  Constitutional: Negative.   Genitourinary:        Pelvic pain    PHYSICAL EXAMINATION:    BP (!) 140/97 (BP Location: Right Arm, Patient Position: Sitting, Cuff Size: Normal)   Pulse 67   Ht 5' (1.524 m)   Wt 158 lb (71.7 kg)   LMP 07/26/2023 Comment: reports neg preg test 3/31 at [facility]  BMI 30.86 kg/m  General appearance: alert, cooperative and appears stated age Neck: no adenopathy, supple, symmetrical, trachea midline and thyroid normal to inspection and palpation CV:  Regular rate and rhythm Lungs:  clear to auscultation, no wheezes, rales or rhonchi, symmetric air entry Breasts: normal appearance, no masses or tenderness Abdomen: soft, non-tender; bowel sounds normal; no masses,  no organomegaly Lymph:  no inguinal LAD noted  Pelvic: External genitalia:  no lesions              Urethra:  normal appearing urethra with no masses, tenderness or lesions              Bartholins and Skenes: normal                 Vagina: normal mucosa without prolapse or lesions              Cervix: no lesions              Bimanual Exam:  Uterus:   normal size, contour, position, consistency, mobility, non-tender              Adnexa: no mass, fullness, tenderness   Chaperone, Raechel Ache, RN, was present for exam.  Assessment/Plan: 1. Abdominal pain, unspecified abdominal location (Primary) - will check urine culture.  Advised to finish all abs.   - Urine Culture - POCT Urinalysis Dipstick  2. Vaginal discharge - will r/o STI and other cases of vaginitis - Cervicovaginal ancillary only( [facility])  3. Pelvic pain  4. Abdominal pain, LLQ (left lower quadrant) - US PELVIC COMPLETE WITH TRANSVAGINAL; Future

## 2023-08-03 ENCOUNTER — Ambulatory Visit (HOSPITAL_BASED_OUTPATIENT_CLINIC_OR_DEPARTMENT_OTHER)
Admission: RE | Admit: 2023-08-03 | Discharge: 2023-08-03 | Disposition: A | Discharge status: Home / Self Care | Source: Ambulatory Visit | Attending: Obstetrics & Gynecology | Admitting: Obstetrics & Gynecology

## 2023-08-03 DIAGNOSIS — E282 Polycystic ovarian syndrome: Secondary | ICD-10-CM | POA: Diagnosis not present

## 2023-08-03 DIAGNOSIS — R1032 Left lower quadrant pain: Secondary | ICD-10-CM | POA: Diagnosis not present

## 2023-08-04 LAB — URINE CULTURE: Organism ID, Bacteria: NO GROWTH

## 2023-08-07 LAB — CERVICOVAGINAL ANCILLARY ONLY
Bacterial Vaginitis (gardnerella): POSITIVE — AB
Candida Glabrata: NEGATIVE
Candida Vaginitis: NEGATIVE
Chlamydia: NEGATIVE
Comment: NEGATIVE
Comment: NEGATIVE
Comment: NEGATIVE
Comment: NEGATIVE
Comment: NEGATIVE
Comment: NORMAL
Neisseria Gonorrhea: NEGATIVE
Trichomonas: NEGATIVE

## 2023-08-08 ENCOUNTER — Other Ambulatory Visit (HOSPITAL_BASED_OUTPATIENT_CLINIC_OR_DEPARTMENT_OTHER): Payer: Self-pay | Admitting: *Deleted

## 2023-08-08 MED ORDER — METRONIDAZOLE 0.75 % VA GEL: 1.0000 | Freq: Every day | VAGINAL | 0 refills | Status: DC

## 2023-08-15 ENCOUNTER — Encounter: Payer: Self-pay | Admitting: Internal Medicine

## 2023-08-15 ENCOUNTER — Ambulatory Visit (INDEPENDENT_AMBULATORY_CARE_PROVIDER_SITE_OTHER): Payer: Federal, State, Local not specified - PPO | Admitting: Internal Medicine

## 2023-08-15 VITALS — BP 128/74 | HR 95 | Ht 60.0 in | Wt 147.0 lb

## 2023-08-15 DIAGNOSIS — E282 Polycystic ovarian syndrome: Secondary | ICD-10-CM | POA: Diagnosis not present

## 2023-08-15 DIAGNOSIS — Z4681 Encounter for fitting and adjustment of insulin pump: Secondary | ICD-10-CM

## 2023-08-15 DIAGNOSIS — E109 Type 1 diabetes mellitus without complications: Secondary | ICD-10-CM | POA: Diagnosis not present

## 2023-08-15 LAB — POCT GLYCOSYLATED HEMOGLOBIN (HGB A1C): Hemoglobin A1C: 6.7 % — AB (ref 4.0–5.6)

## 2023-08-15 NOTE — Progress Notes (Signed)
 Name: Judy Cook  MRN/ DOB: [id number], 08/12/2000   Age/ Sex: 23 y.o., female    PCP: Zorita Hiss, NP   Reason for Endocrinology Evaluation: Type 1 Diabetes Mellitus     Date of Initial Endocrinology Visit: 07/26/2021    PATIENT IDENTIFIER: Judy Cook is a 23 y.o. female with a past medical history of T1DM. The patient presented for initial endocrinology clinic visit on 07/26/2021  for consultative assistance with her diabetes management.    HPI: Judy Cook was    Diagnosed with DM at age 67               Hemoglobin A1c has ranged from 10.7% in 2023, peaking at 12% in 2021. Patient required assistance for hypoglycemia: when she was young Patient has required hospitalization within the last 1 year from hyper or hypoglycemia: Patient with history of DKA but not in 2022  On her initial visit to our clinic she had an A1c of 10.7%, she was referred for pump training on the Tandem 10/2021     SUBJECTIVE:   During the last visit (04/13/2023): A1c 8.2% .     Today (08/15/23): Judy Cook is here for a follow up on diabetes management. She checks her blood sugars multiple  times daily through CGM. The patient has  had hypoglycemic episodes since the last clinic visit.   She was evaluated by GYN for pelvic pain 07/2023 .  Pelvic ultrasound consistent with polycystic ovaries, she is on OCPs but she is not consistent     She has mild nausea a couple of days ago  No vomiting  She has constipation  LMP 08/15/2023 Has acne and hirsutism around the chin    This patient with type 1 diabetes is treated with Tandem  (insulin  pump). During the visit the pump basal and bolus doses were reviewed including carb/insulin  rations and supplemental doses. The clinical list was updated. The glucose meter download was reviewed in detail to determine if the current pump settings are providing the best glycemic control without excessive  hypoglycemia.  Pump and meter download:   Pump   Tandem  Settings   Insulin  type   Novolog     Basal rate       0000 0.8 u/h    0800 1.00          I:C ratio       0000 1:1    #8  g with a meal,# 4 g with a snack              Sensitivity       0000  45      Goal       0000  120             Type & Model of Pump: Tandem  Insulin  Type: Currently using NovoLog .  Body mass index is 28.71 kg/m.  PUMP STATISTICS: Average BG: 160 Average Daily Carbs (g): 18 Average Total Daily Insulin : 55.21 Average Daily Basal: 25.59(46 %) Average Daily Bolus: 29.72(54%)  CONTINUOUS GLUCOSE MONITORING RECORD INTERPRETATION    Dates of Recording: 4/10-4/23/2025  Sensor description:dexcom  Results statistics:   CGM use % of time 98  Average and SD 160/56  Time in range     66   %  % Time Above 180 25  % Time above 250 7.8  % Time Below target 1.2   Glycemic patterns summary: BGs are optimal overnight and fluctuate during the  day  Hyperglycemic episodes postprandial  Hypoglycemic episodes occurred rare  Overnight periods: Optimal     HOME DIABETES REGIMEN:  Novolog     Statin: no ACE-I/ARB: no Prior Diabetic Education: yes   CONTINUOUS GLUCOSE MONITORING RECORD INTERPRETATION   N/a   DIABETIC COMPLICATIONS: Microvascular complications:   Denies: CKD, retinopathy, neuropathy  Last eye exam: Completed years   Macrovascular complications:   Denies: CAD, PVD, CVA   PAST HISTORY: Past Medical History:  Past Medical History:  Diagnosis Date   Allergic rhinitis 07/07/2021   COVID 2020   mild   Diabetes mellitus (HCC)    Type 1, type 5   Dysmenorrhea 02/20/2019   GERD (gastroesophageal reflux disease)    Headache    History of herpes genitalis 06/24/2021   Intractable vomiting    SIRS (systemic inflammatory response syndrome) (HCC) 12/03/2019   Past Surgical History:  Past Surgical History:  Procedure Laterality Date   DILATION AND EVACUATION  N/A 07/05/2022   Procedure: DILATATION AND EVACUATION;  Surgeon: Raynell Caller, MD;  Location: [facility] OR;  Service: Gynecology;  Laterality: N/A;   OPERATIVE ULTRASOUND N/A 07/05/2022   Procedure: OPERATIVE ULTRASOUND;  Surgeon: Raynell Caller, MD;  Location: [facility] OR;  Service: Gynecology;  Laterality: N/A;    Social History:  reports that she has quit smoking. Her smoking use included cigarettes. She has never used smokeless tobacco. She reports current alcohol use. She reports current drug use. Drug: Marijuana. Family History:  Family History  Problem Relation Age of Onset   Diabetes Paternal Grandfather    Heart attack Paternal Grandfather    Diabetes Paternal Grandmother    Cancer Maternal Grandmother        lung   Alcohol abuse Maternal Grandfather    Alcohol abuse Father    Drug abuse Father    High Cholesterol Father    Alcohol abuse Mother    Depression Mother    Drug abuse Mother      HOME MEDICATIONS: Allergies as of 08/15/2023   No Known Allergies      Medication List        Accurate as of August 15, 2023 11:18 AM. If you have any questions, ask your nurse or doctor.          amoxicillin  875 MG tablet Commonly known as: AMOXIL  Take 1 tablet (875 mg total) by mouth 2 (two) times daily.   cetirizine  10 MG tablet Commonly known as: ZyrTEC  Allergy Take 1 tablet (10 mg total) by mouth daily.   Dexcom G7 Sensor Misc Change every 10 days   ibuprofen  200 MG tablet Commonly known as: ADVIL  Take 200 mg by mouth as needed for moderate pain.   metoCLOPramide  5 MG tablet Commonly known as: Reglan  Take 1 tablet (5 mg total) by mouth 3 (three) times daily.   metroNIDAZOLE  0.75 % vaginal gel Commonly known as: METROGEL  Place 1 Applicatorful vaginally at bedtime. Use for 5 nights.   multivitamin with minerals tablet Take 2 tablets by mouth daily.   naproxen sodium 220 MG tablet Commonly known as: ALEVE Take 440 mg by mouth daily as needed (pain).   NovoLOG   100 UNIT/ML injection Generic drug: insulin  aspart MAX DAILY 60 UNITS PER PUMP   promethazine  25 MG suppository Commonly known as: PHENERGAN  Place 1 suppository (25 mg total) rectally every 8 (eight) hours as needed for nausea or vomiting.   pseudoephedrine  60 MG tablet Commonly known as: SUDAFED Take 1 tablet (60 mg total) by mouth every 8 (eight)  hours as needed for congestion.   [employer] Nasal Decongestant 30 MG tablet Generic drug: pseudoephedrine  Take 60 mg by mouth every 8 (eight) hours as needed.         ALLERGIES: No Known Allergies   REVIEW OF SYSTEMS: A comprehensive ROS was conducted with the patient and is negative except as per HPI and below:      OBJECTIVE:   VITAL SIGNS: BP 128/74 (BP Location: Left Arm, Patient Position: Sitting, Cuff Size: Small)   Pulse 95   Ht 5' (1.524 m)   Wt 147 lb (66.7 kg)   LMP 07/26/2023 Comment: reports neg preg test [facility]  SpO2 99%   BMI 28.71 kg/m    PHYSICAL EXAM:    Exam: General: Pt appears well and is in NAD  Lungs: Clear with good BS bilat   Heart: RRR   Extremities: No pretibial edema.   Neuro: MS is good with appropriate affect, pt is alert and Ox3    DM foot exam: 04/13/2023  The skin of the feet is intact without sores or ulcerations. The pedal pulses are 2+ on right and 2+ on left. The sensation is intact to a screening 5.07, 10 gram monofilament bilaterally    DATA REVIEWED:  Lab Results  Component Value Date   HGBA1C 6.7 (A) 08/15/2023   HGBA1C 8.2 (A) 04/13/2023   HGBA1C 7.6 (H) 09/05/2022    Latest Reference Range & Units 06/29/23 14:34  Sodium 135 - 145 mmol/L 136  Potassium 3.5 - 5.1 mmol/L 3.6  Chloride 98 - 111 mmol/L 103  CO2 22 - 32 mmol/L 25  Glucose 70 - 99 mg/dL 161 (H)  BUN 6 - 20 mg/dL 7  Creatinine 0.96 - 0.45 mg/dL 4.09  Calcium  8.9 - 10.3 mg/dL 9.5  Anion gap 5 - 15  8  Alkaline Phosphatase 38 - 126 U/L 67  Albumin 3.5 - 5.0 g/dL 4.6  AST 15 - 41 U/L 19  ALT 0 -  44 U/L 18  Total Protein 6.5 - 8.1 g/dL 8.2 (H)  Total Bilirubin 0.0 - 1.2 mg/dL 0.5  GFR, Estimated >81 mL/min >60  (H): Data is abnormally high  ASSESSMENT / PLAN / RECOMMENDATIONS:   1) Type 1 Diabetes Mellitus, Optimally controlled, Without complications - Most recent A1c of 6.7%. Goal A1c <7.0%.    - I have praised the patient on optimizing glucose control - She has been doing better with entering between 6-8 g of carbohydrates with each meal rather than counting carbohydrates - She has been noted with hypoglycemia with correction, I adjusted his sensitivity factor and from 45 to 50    Pump   Tandem  Settings   Insulin  type   Novolog     Basal rate       0000 0.8 u/h    0800 1.00          I:C ratio       0000 1:1    #8 g with a meal,# 4 g with a snack              Sensitivity       0000  45      Goal       0000  120          MEDICATIONS:   Novolog     EDUCATION / INSTRUCTIONS: BG monitoring instructions: Patient is instructed to check her blood sugars 3  x daily before meals   Call [facility] if:  BG persistently < 70  I reviewed the Rule of 15 for the treatment of hypoglycemia in detail with the patient. Literature supplied.   2) Diabetic complications:  Eye: Does not have known diabetic retinopathy.  Neuro/ Feet: Does not have known diabetic peripheral neuropathy. Renal: Patient does not have known baseline CKD. She is not on an ACEI/ARB at present.    3) Lipids: No indication for statin therapy at this time   4) PCOS:  -She was diagnosed with this through pelvic ultrasound 07/2023 - She does endorse acne and hirsutism -We did discuss the pathophysiology of PCOS, I did encourage following a healthy lifestyle - She is on OCPs, but she is not consistent with taking them, I did advise the patient to discuss with GYN regarding switching to alternative modes that does not require her to take daily pill The management of PCOS involves  amelioration of hyperandrogenic symptoms, metabolic abnormalities , prevention of endometrial hyperplasia and carcinoma as well as contraception for those not pursuing pregnancy.      Follow-up in 4 months    Signed electronically by: Natale Bail, MD  [facility] Endocrinology  [facility] [address] [place] [place], [place] [address] Phone: [phone] FAX: [phone]   CC: Zorita Hiss, NP [address] [place] [address] Phone: [phone]  Fax: [phone]    Return to Endocrinology clinic as below: Future Appointments  Date Time Provider Department Center  08/17/2023 10:55 AM Lillian Rein, MD [facility]  08/31/2023  9:40 AM Zorita Hiss, NP [facility] None

## 2023-08-16 ENCOUNTER — Encounter: Payer: Self-pay | Admitting: Internal Medicine

## 2023-08-17 ENCOUNTER — Ambulatory Visit (HOSPITAL_BASED_OUTPATIENT_CLINIC_OR_DEPARTMENT_OTHER): Admitting: Obstetrics & Gynecology

## 2023-08-27 ENCOUNTER — Encounter (HOSPITAL_BASED_OUTPATIENT_CLINIC_OR_DEPARTMENT_OTHER): Payer: Self-pay | Admitting: Obstetrics & Gynecology

## 2023-08-27 ENCOUNTER — Ambulatory Visit (INDEPENDENT_AMBULATORY_CARE_PROVIDER_SITE_OTHER): Admitting: Obstetrics & Gynecology

## 2023-08-27 VITALS — BP 138/88 | HR 75 | Ht 60.0 in | Wt 148.0 lb

## 2023-08-27 DIAGNOSIS — R102 Pelvic and perineal pain: Secondary | ICD-10-CM

## 2023-08-27 DIAGNOSIS — E282 Polycystic ovarian syndrome: Secondary | ICD-10-CM

## 2023-08-27 DIAGNOSIS — K5904 Chronic idiopathic constipation: Secondary | ICD-10-CM | POA: Diagnosis not present

## 2023-08-27 MED ORDER — IBUPROFEN 800 MG PO TABS: 800.0000 mg | ORAL_TABLET | Freq: Three times a day (TID) | ORAL | 2 refills | Status: DC | PRN

## 2023-08-27 NOTE — Patient Instructions (Signed)
 Colace 100mg  up 400mg  daily.  Generic:  docusate sodium .    Smooth move tea.    Miralax , 1 cup full daily or twice daily.    For constipation that feels very low:  dulcolax or enema

## 2023-08-27 NOTE — Progress Notes (Unsigned)
 GYNECOLOGY  VISIT  CC:   discuss ultrasound findings  HPI: 23 y.o. [id number] Single Black or [place] American female here for discussion of ultrasound findings.  Uterus normal with endometrium measuring 6mm.  Ovaries normal with multiple small peripheral follicles c/w PCOS.  No abnormal masses, fluid collection noted.  PCOS discussed.  Pt not interested in any hormonal therapy at this time including contraception.  Has used Plan B this year so understands timing if needed again.  Recommended testosterone level testing.  Pt agrees.  Does not have completely regular cycles but does have menstrual bleeding at least once every three months.  Does still have some LLQ quadrant pain but does have constipation and pain is worse when constipation worse so this is likely the cause.  OTC treatments including colace, smooth move tea, miralax , dulcolax and fleets enema (and when and how to use) all discussed.  When has more pain ibuprofen  helps.  Will send in prescription for 800mg  tablets.  Last hba1c was 6.7 on 08/15/2023.  Last pap smear 06/2021.  Plan repeat 06/2024.   Past Medical History:  Diagnosis Date   Allergic rhinitis 07/07/2021   COVID 2020   mild   Diabetes mellitus (HCC)    Type 1, type 5   Dysmenorrhea 02/20/2019   GERD (gastroesophageal reflux disease)    Headache    History of herpes genitalis 06/24/2021   Intractable vomiting    SIRS (systemic inflammatory response syndrome) (HCC) 12/03/2019    MEDS:   Current Outpatient Medications on File Prior to Visit  Medication Sig Dispense Refill   cetirizine  (ZYRTEC  ALLERGY) 10 MG tablet Take 1 tablet (10 mg total) by mouth daily. 30 tablet 0   Continuous Glucose Sensor (DEXCOM G7 SENSOR) MISC Change every 10 days 9 each 3   [employer]S NASAL DECONGESTANT 30 MG tablet Take 60 mg by mouth every 8 (eight) hours as needed.     ibuprofen  (ADVIL ) 200 MG tablet Take 200 mg by mouth as needed for moderate pain.     Multiple Vitamins-Minerals (MULTIVITAMIN  WITH MINERALS) tablet Take 2 tablets by mouth daily.     naproxen sodium (ALEVE) 220 MG tablet Take 440 mg by mouth daily as needed (pain).     NOVOLOG  100 UNIT/ML injection MAX DAILY 60 UNITS PER PUMP 60 mL 11   amoxicillin  (AMOXIL ) 875 MG tablet Take 1 tablet (875 mg total) by mouth 2 (two) times daily. (Patient not taking: Reported on 08/27/2023) 14 tablet 0   metoCLOPramide  (REGLAN ) 5 MG tablet Take 1 tablet (5 mg total) by mouth 3 (three) times daily. 90 tablet 5   metroNIDAZOLE  (METROGEL ) 0.75 % vaginal gel Place 1 Applicatorful vaginally at bedtime. Use for 5 nights. (Patient not taking: Reported on 08/27/2023) 70 g 0   promethazine  (PHENERGAN ) 25 MG suppository Place 1 suppository (25 mg total) rectally every 8 (eight) hours as needed for nausea or vomiting. (Patient not taking: Reported on 08/02/2023) 12 each 0   pseudoephedrine  (SUDAFED) 60 MG tablet Take 1 tablet (60 mg total) by mouth every 8 (eight) hours as needed for congestion. (Patient not taking: Reported on 08/27/2023) 30 tablet 0   No current facility-administered medications on file prior to visit.    ALLERGIES: Patient has no known allergies.  SH:  single, non smoker  Review of Systems  Constitutional: Negative.   Genitourinary: Negative.     PHYSICAL EXAMINATION:    BP 138/88 (BP Location: Left Arm, Patient Position: Sitting, Cuff Size: Normal)   Pulse  75   Ht 5' (1.524 m)   Wt 148 lb (67.1 kg)   LMP 07/26/2023 Comment: reports neg preg test [facility] at [facility]  BMI 28.90 kg/m     Physical Exam Constitutional:      Appearance: Normal appearance.  Neurological:     General: No focal deficit present.     Mental Status: She is alert.  Psychiatric:        Mood and Affect: Mood normal.     Assessment/Plan: 1. PCOS (polycystic ovarian syndrome) (Primary) - will check testosterone level.  Declines any hormonal therapy at this time - Testosterone, Total, LC/MS/MS; Future  2. Pelvic cramping - rx for ibuprofen  to pharmacy  on file - ibuprofen  (ADVIL ) 800 MG tablet; Take 1 tablet (800 mg total) by mouth every 8 (eight) hours as needed.  Dispense: 30 tablet; Refill: 2  3. Chronic idiopathic constipation - OTC options written for pt for better management of this symptom

## 2023-08-31 ENCOUNTER — Ambulatory Visit: Payer: Federal, State, Local not specified - PPO | Admitting: Nurse Practitioner

## 2023-08-31 DIAGNOSIS — E1065 Type 1 diabetes mellitus with hyperglycemia: Secondary | ICD-10-CM | POA: Diagnosis not present

## 2023-09-07 ENCOUNTER — Other Ambulatory Visit (HOSPITAL_BASED_OUTPATIENT_CLINIC_OR_DEPARTMENT_OTHER): Payer: Self-pay | Admitting: Obstetrics & Gynecology

## 2023-09-07 DIAGNOSIS — Z3041 Encounter for surveillance of contraceptive pills: Secondary | ICD-10-CM

## 2023-09-07 DIAGNOSIS — R030 Elevated blood-pressure reading, without diagnosis of hypertension: Secondary | ICD-10-CM

## 2023-09-14 ENCOUNTER — Ambulatory Visit: Admitting: Nurse Practitioner

## 2023-09-27 ENCOUNTER — Ambulatory Visit: Admitting: Nurse Practitioner

## 2023-10-01 DIAGNOSIS — E1065 Type 1 diabetes mellitus with hyperglycemia: Secondary | ICD-10-CM | POA: Diagnosis not present

## 2023-11-01 DIAGNOSIS — E1065 Type 1 diabetes mellitus with hyperglycemia: Secondary | ICD-10-CM | POA: Diagnosis not present

## 2023-11-05 ENCOUNTER — Other Ambulatory Visit: Payer: Self-pay | Admitting: Internal Medicine

## 2023-11-15 ENCOUNTER — Ambulatory Visit: Admitting: Nurse Practitioner

## 2023-12-03 ENCOUNTER — Ambulatory Visit: Admitting: Endocrinology

## 2023-12-03 ENCOUNTER — Ambulatory Visit: Admitting: Internal Medicine

## 2023-12-03 DIAGNOSIS — E1065 Type 1 diabetes mellitus with hyperglycemia: Secondary | ICD-10-CM | POA: Diagnosis not present

## 2023-12-20 ENCOUNTER — Ambulatory Visit: Admitting: Nurse Practitioner

## 2023-12-28 ENCOUNTER — Ambulatory Visit: Admitting: Nurse Practitioner

## 2023-12-31 ENCOUNTER — Ambulatory Visit: Admitting: Internal Medicine

## 2024-01-02 DIAGNOSIS — E1065 Type 1 diabetes mellitus with hyperglycemia: Secondary | ICD-10-CM | POA: Diagnosis not present

## 2024-01-04 ENCOUNTER — Ambulatory Visit (INDEPENDENT_AMBULATORY_CARE_PROVIDER_SITE_OTHER): Admitting: Nurse Practitioner

## 2024-01-04 VITALS — BP 120/72 | HR 89 | Temp 98.3°F | Ht 60.0 in | Wt 149.2 lb

## 2024-01-04 DIAGNOSIS — R41840 Attention and concentration deficit: Secondary | ICD-10-CM | POA: Diagnosis not present

## 2024-01-04 DIAGNOSIS — F411 Generalized anxiety disorder: Secondary | ICD-10-CM

## 2024-01-04 MED ORDER — SERTRALINE HCL 25 MG PO TABS: 25.0000 mg | ORAL_TABLET | Freq: Every day | ORAL | 1 refills | Status: DC

## 2024-01-04 NOTE — Progress Notes (Signed)
 Established Patient Office Visit  Subjective   Patient ID: Judy Cook, female    DOB: 2000/10/27  Age: 23 y.o. MRN: [id number]  Chief Complaint  Patient presents with   Anxiety    Discussed the use of AI scribe software for clinical note transcription with the patient, who gave verbal consent to proceed.  History of Present Illness Judy Cook is a 23 year old female who presents for follow-up and reports feeling anxious.  Anxiety symptoms - Anxiety occurs approximately three times per week - Describes feeling 'a little burdened' and 'overwhelmed' - Relationship dynamics contribute to anxiety - No suicidal ideation or other extreme symptoms - No prior use of medication for anxiety - Considering treatment options  Sleep disturbance - Poor sleep over the past couple of months - No clear trigger identified  Attention difficulties - Attention issues present - No evaluation by a neurologist       ROS:see HPI    Objective:     BP 120/72 (Cuff Size: Large)   Pulse 89   Temp 98.3 F (36.8 C) (Temporal)   Ht 5' (1.524 m)   Wt 149 lb 4 oz (67.7 kg)   LMP 12/12/2023   SpO2 99%   BMI 29.15 kg/m    Physical Exam Vitals reviewed.  Constitutional:      General: She is not in acute distress.    Appearance: Normal appearance.  HENT:     Head: Normocephalic and atraumatic.  Neck:     Vascular: No carotid bruit.  Cardiovascular:     Rate and Rhythm: Normal rate and regular rhythm.     Pulses: Normal pulses.     Heart sounds: Normal heart sounds.  Pulmonary:     Effort: Pulmonary effort is normal.     Breath sounds: Normal breath sounds.  Skin:    General: Skin is warm and dry.  Neurological:     General: No focal deficit present.     Mental Status: She is alert and oriented to person, place, and time.  Psychiatric:        Mood and Affect: Mood normal.        Behavior: Behavior normal.        Judgment: Judgment normal.        No  data to display            No results found for any visits on 01/04/24.    The ASCVD Risk score (Arnett DK, et al., 2019) failed to calculate for the following reasons:   The 2019 ASCVD risk score is only valid for ages 56 to 64    Assessment & Plan:   Problem List Items Addressed This Visit       Other   GAD (generalized anxiety disorder) - Primary   Generalized anxiety disorder Discussed potential contribution of relationship dynamics and possible underlying ADD. Recommended medication and counseling for effective management. Preferred Zoloft  as first-line treatment. Explained common side effects and importance of monitoring for suicidal thoughts. - Start Zoloft  25 mg once daily, morning or evening. - Refer to [facility] for ADD evaluation. - Refer to counseling services. - Schedule follow-up in six weeks to assess Zoloft  response. - Instruct to monitor for side effects, especially suicidal thoughts, and contact office if she occurs.      Relevant Medications   sertraline  (ZOLOFT ) 25 MG tablet   Other Relevant Orders   Ambulatory referral to Psychiatry   Ambulatory referral to Psychology   Other  Visit Diagnoses       Attention and concentration deficit       Relevant Orders   Ambulatory referral to Psychiatry      Assessment and Plan Assessment & Plan Generalized anxiety disorder Discussed potential contribution of relationship dynamics and possible underlying ADD. Recommended medication and counseling for effective management. Preferred Zoloft  as first-line treatment. Explained common side effects and importance of monitoring for suicidal thoughts. - Start Zoloft  25 mg once daily, morning or evening. - Refer to [facility] for ADD evaluation. - Refer to counseling services. - Schedule follow-up in six weeks to assess Zoloft  response. - Instruct to monitor for side effects, especially suicidal thoughts, and contact office  if she occurs.   Return in about 6 weeks (around 02/15/2024) for F/U with Lauraine.    Lauraine FORBES Pereyra, NP

## 2024-01-04 NOTE — Assessment & Plan Note (Signed)
 Generalized anxiety disorder Discussed potential contribution of relationship dynamics and possible underlying ADD. Recommended medication and counseling for effective management. Preferred Zoloft  as first-line treatment. Explained common side effects and importance of monitoring for suicidal thoughts. - Start Zoloft  25 mg once daily, morning or evening. - Refer to [facility] for ADD evaluation. - Refer to counseling services. - Schedule follow-up in six weeks to assess Zoloft  response. - Instruct to monitor for side effects, especially suicidal thoughts, and contact office if she occurs.

## 2024-01-10 ENCOUNTER — Ambulatory Visit (INDEPENDENT_AMBULATORY_CARE_PROVIDER_SITE_OTHER): Admitting: Internal Medicine

## 2024-01-10 ENCOUNTER — Encounter: Payer: Self-pay | Admitting: Internal Medicine

## 2024-01-10 VITALS — BP 124/80 | HR 82 | Ht 60.0 in | Wt 146.0 lb

## 2024-01-10 DIAGNOSIS — E109 Type 1 diabetes mellitus without complications: Secondary | ICD-10-CM

## 2024-01-10 LAB — POCT GLYCOSYLATED HEMOGLOBIN (HGB A1C): Hemoglobin A1C: 6.7 % — AB (ref 4.0–5.6)

## 2024-01-10 NOTE — Progress Notes (Unsigned)
 Name: Judy Cook  MRN/ DOB: [id number], 07/12/2000   Age/ Sex: 23 y.o., female    PCP: Elnor Lauraine BRAVO, NP   Reason for Endocrinology Evaluation: Type 1 Diabetes Mellitus     Date of Initial Endocrinology Visit: 07/26/2021    PATIENT IDENTIFIER: Judy Cook is a 23 y.o. female with a past medical history of T1DM. The patient presented for initial endocrinology clinic visit on 07/26/2021  for consultative assistance with her diabetes management.    HPI: Judy Cook was    Diagnosed with DM at age 33               Hemoglobin A1c has ranged from 10.7% in 2023, peaking at 12% in 2021. Patient required assistance for hypoglycemia: when she was young Patient has required hospitalization within the last 1 year from hyper or hypoglycemia: Patient with history of DKA but not in 2022  On her initial visit to our clinic she had an A1c of 10.7%, she was referred for pump training on the Tandem 10/2021    PCOS: She was evaluated by GYN for pelvic pain 07/2023 .  Pelvic ultrasound consistent with polycystic ovaries, she also endorsed hyper androgenic symptoms with acne and hirsutism. She was put on COC's through GYN office  SUBJECTIVE:   During the last visit (08/15/2023): A1c 6.7% .     Today (01/10/24): Judy Cook is here for a follow up on diabetes management. She checks her blood sugars multiple  times daily through CGM. The patient has  had hypoglycemic episodes since the last clinic visit.  Was recently started on Zoloft  with decrease appetite No nausea LMP 9/16th Has occasional constipation     This patient with type 1 diabetes is treated with Tandem  (insulin  pump). During the visit the pump basal and bolus doses were reviewed including carb/insulin  rations and supplemental doses. The clinical list was updated. The glucose meter download was reviewed in detail to determine if the current pump settings are providing the best glycemic  control without excessive hypoglycemia.  Pump and meter download:     Pump   Tandem  Settings   Insulin  type   Novolog     Basal rate       0000 0.8 u/h    0800 1.00          I:C ratio       0000 1:1    #8 g with a meal,# 4 g with a snack              Sensitivity       0000  50      Goal       0000  120            Type & Model of Pump: Tandem  Insulin  Type: Currently using NovoLog .  Body mass index is 28.51 kg/m.  PUMP STATISTICS: Average BG: 167 Average Daily Carbs (g): 10 Average Total Daily Insulin :49.30 Average Daily Basal: 22.69(46 %) Average Daily Bolus: 26.61(54%)  CONTINUOUS GLUCOSE MONITORING RECORD INTERPRETATION    Dates of Recording: 9/5-9/18/2025  Sensor description:dexcom  Results statistics:   CGM use % of time 93  Average and SD 167/76  Time in range 57   %  % Time Above 180 23  % Time above 250 15  % Time Below target 4   Glycemic patterns summary: Patient has been noted with fluctuating BG's throughout the day and night  Hyperglycemic episodes postprandial  Hypoglycemic  episodes occurred after bolus  Overnight periods: Mostly optimal     HOME DIABETES REGIMEN:  Novolog     Statin: no ACE-I/ARB: no Prior Diabetic Education: yes   CONTINUOUS GLUCOSE MONITORING RECORD INTERPRETATION   N/a   DIABETIC COMPLICATIONS: Microvascular complications:   Denies: CKD, retinopathy, neuropathy  Last eye exam: Completed years   Macrovascular complications:   Denies: CAD, PVD, CVA   PAST HISTORY: Past Medical History:  Past Medical History:  Diagnosis Date   Allergic rhinitis 07/07/2021   COVID 2020   mild   Diabetes mellitus (HCC)    Type 1, type 5   Dysmenorrhea 02/20/2019   GERD (gastroesophageal reflux disease)    Headache    History of herpes genitalis 06/24/2021   Intractable vomiting    SIRS (systemic inflammatory response syndrome) (HCC) 12/03/2019   Past Surgical History:  Past Surgical History:   Procedure Laterality Date   DILATION AND EVACUATION N/A 07/05/2022   Procedure: DILATATION AND EVACUATION;  Surgeon: Izell Harari, MD;  Location: [facility] OR;  Service: Gynecology;  Laterality: N/A;   OPERATIVE ULTRASOUND N/A 07/05/2022   Procedure: OPERATIVE ULTRASOUND;  Surgeon: Izell Harari, MD;  Location: [facility] OR;  Service: Gynecology;  Laterality: N/A;    Social History:  reports that she has quit smoking. Her smoking use included cigarettes. She has never used smokeless tobacco. She reports current alcohol use. She reports current drug use. Drug: Marijuana. Family History:  Family History  Problem Relation Age of Onset   Diabetes Paternal Grandfather    Heart attack Paternal Grandfather    Diabetes Paternal Grandmother    Cancer Maternal Grandmother        lung   Alcohol abuse Maternal Grandfather    Alcohol abuse Father    Drug abuse Father    High Cholesterol Father    Alcohol abuse Mother    Depression Mother    Drug abuse Mother      HOME MEDICATIONS: Allergies as of 01/10/2024   No Known Allergies      Medication List        Accurate as of January 10, 2024 10:11 AM. If you have any questions, ask your nurse or doctor.          cetirizine  10 MG tablet Commonly known as: ZyrTEC  Allergy Take 1 tablet (10 mg total) by mouth daily.   Dexcom G7 Sensor Misc CHANGE EVERY 10 DAYS   ibuprofen  800 MG tablet Commonly known as: ADVIL  Take 1 tablet (800 mg total) by mouth every 8 (eight) hours as needed.   metoCLOPramide  5 MG tablet Commonly known as: Reglan  Take 1 tablet (5 mg total) by mouth 3 (three) times daily.   multivitamin with minerals tablet Take 2 tablets by mouth daily.   naproxen sodium 220 MG tablet Commonly known as: ALEVE Take 440 mg by mouth daily as needed (pain).   NovoLOG  100 UNIT/ML injection Generic drug: insulin  aspart MAX DAILY 60 UNITS PER PUMP   sertraline  25 MG tablet Commonly known as: ZOLOFT  Take 1 tablet (25 mg total) by  mouth daily.         ALLERGIES: No Known Allergies   REVIEW OF SYSTEMS: A comprehensive ROS was conducted with the patient and is negative except as per HPI and below:      OBJECTIVE:   VITAL SIGNS: BP 124/80 (BP Location: Left Arm, Patient Position: Sitting, Cuff Size: Normal)   Pulse 82   Ht 5' (1.524 m)   Wt 146 lb (66.2 kg)  LMP 12/12/2023   SpO2 99%   BMI 28.51 kg/m    PHYSICAL EXAM:    Exam: General: Pt appears well and is in NAD  Lungs: Clear with good BS bilat   Heart: RRR   Extremities: No pretibial edema.   Neuro: MS is good with appropriate affect, pt is alert and Ox3    DM foot exam: 01/10/2024  The skin of the feet is intact without sores or ulcerations. The pedal pulses are 2+ on right and 2+ on left. The sensation is intact to a screening 5.07, 10 gram monofilament bilaterally    DATA REVIEWED:  Lab Results  Component Value Date   HGBA1C 6.7 (A) 08/15/2023   HGBA1C 8.2 (A) 04/13/2023   HGBA1C 7.6 (H) 09/05/2022    Latest Reference Range & Units 01/10/24 10:34  TSH mIU/L 0.54  T4,Free(Direct) 0.8 - 1.8 ng/dL 1.1      Latest Reference Range & Units 01/10/24 10:34  Total CHOL/HDL Ratio <5.0 (calc) 3.0  Cholesterol <200 mg/dL 821  HDL Cholesterol > OR = 50 mg/dL 59  LDL Cholesterol (Calc) mg/dL (calc) 894 (H)  MICROALB/CREAT RATIO <30 mg/g creat 107 (H)  Non-HDL Cholesterol (Calc) <130 mg/dL (calc) 880  Triglycerides <150 mg/dL 59  (H): Data is abnormally high    Latest Reference Range & Units 06/29/23 14:34  Sodium 135 - 145 mmol/L 136  Potassium 3.5 - 5.1 mmol/L 3.6  Chloride 98 - 111 mmol/L 103  CO2 22 - 32 mmol/L 25  Glucose 70 - 99 mg/dL 805 (H)  BUN 6 - 20 mg/dL 7  Creatinine 9.55 - 8.99 mg/dL 9.21  Calcium  8.9 - 10.3 mg/dL 9.5  Anion gap 5 - 15  8  Alkaline Phosphatase 38 - 126 U/L 67  Albumin 3.5 - 5.0 g/dL 4.6  AST 15 - 41 U/L 19  ALT 0 - 44 U/L 18  Total Protein 6.5 - 8.1 g/dL 8.2 (H)  Total Bilirubin 0.0 - 1.2  mg/dL 0.5  GFR, Estimated >39 mL/min >60    ASSESSMENT / PLAN / RECOMMENDATIONS:   1) Type 1 Diabetes Mellitus, Optimally controlled, With Microalbuminuria complications - Most recent A1c of 6.7%. Goal A1c <7.0%.    - I have praised the patient on optimizing glucose control - She has been doing better with entering between 6-8 g of carbohydrates with each meal rather than counting carbohydrates - She has been noted with hypoglycemia with correction, I adjusted his sensitivity factor and from 45 to 50    Pump   Tandem  Settings   Insulin  type   Novolog     Basal rate       0000 0.8 u/h    0800 1.00          I:C ratio       0000 1:1    #8 g with a meal,# 4 g with a snack              Sensitivity       0000  45      Goal       0000  120          MEDICATIONS:   Novolog     EDUCATION / INSTRUCTIONS: BG monitoring instructions: Patient is instructed to check her blood sugars 3  x daily before meals   Call [facility] if: BG persistently < 70  I reviewed the Rule of 15 for the treatment of hypoglycemia in detail with the patient. Literature supplied.  2) Diabetic complications:  Eye: Does not have known diabetic retinopathy.  Neuro/ Feet: Does not have known diabetic peripheral neuropathy. Renal: Patient does not have known baseline CKD. She is not on an ACEI/ARB at present.    3) Lipids: No indication for statin therapy at this time   4) PCOS:  -She was diagnosed with this through pelvic ultrasound 07/2023, and hyper androgenic symptoms - She is on OCPs through GYN has been consistent - The management of PCOS involves amelioration of hyperandrogenic symptoms, metabolic abnormalities , prevention of endometrial hyperplasia and carcinoma as well as contraception for those not pursuing pregnancy.   5) Dyslipidemia:   -Will encourage low-fat diet   6) Microalbuminuria  -MA/CR ratio elevated, will encourage optimizing glucose control - Will  continue to monitor, if this persists will consider ACE inhibitor or ARB    Follow-up in 4 months    Signed electronically by: Stefano Redgie Butts, MD  [facility]  [facility] [address] [place] [place], [place] [address] Phone: [phone] FAX: [phone]   CC: Elnor Lauraine BRAVO, NP [address] [place] [address] Phone: [phone]  Fax: [phone]    Return to Endocrinology clinic as below: Future Appointments  Date Time Provider Department Center  02/15/2024 10:00 AM Elnor Lauraine BRAVO, NP [facility]

## 2024-01-10 NOTE — Patient Instructions (Addendum)
 Entered between 4-6 g of carbohydrates when eating  HOW TO TREAT LOW BLOOD SUGARS (Blood sugar LESS THAN 70 MG/DL) Please follow the RULE OF 15 for the treatment of hypoglycemia treatment (when your (blood sugars are less than 70 mg/dL)   STEP 1: Take 15 grams of carbohydrates when your blood sugar is low, which includes:  3-4 GLUCOSE TABS  OR 3-4 OZ OF JUICE OR REGULAR SODA OR ONE TUBE OF GLUCOSE GEL    STEP 2: RECHECK blood sugar in 15 MINUTES STEP 3: If your blood sugar is still low at the 15 minute recheck --> then, go back to STEP 1 and treat AGAIN with another 15 grams of carbohydrates.

## 2024-01-11 ENCOUNTER — Ambulatory Visit: Payer: Self-pay | Admitting: Internal Medicine

## 2024-01-11 LAB — LIPID PANEL
Cholesterol: 178 mg/dL (ref <200)
HDL: 59 mg/dL (ref >=50)
LDL Cholesterol (Calc): 105 mg/dL — ABNORMAL HIGH
Non-HDL Cholesterol (Calc): 119 mg/dL (ref <130)
Total CHOL/HDL Ratio: 3 (calc) (ref <5.0)
Triglycerides: 59 mg/dL (ref <150)

## 2024-01-11 LAB — T4, FREE: Free T4: 1.1 ng/dL (ref 0.8–1.8)

## 2024-01-11 LAB — MICROALBUMIN / CREATININE URINE RATIO
Creatinine, Urine: 377 mg/dL — ABNORMAL HIGH (ref 20–275)
Microalb Creat Ratio: 107 mg/g{creat} — ABNORMAL HIGH (ref <30)
Microalb, Ur: 40.5 mg/dL

## 2024-01-11 LAB — TSH: TSH: 0.54 m[IU]/L

## 2024-02-01 DIAGNOSIS — E1065 Type 1 diabetes mellitus with hyperglycemia: Secondary | ICD-10-CM | POA: Diagnosis not present

## 2024-02-15 ENCOUNTER — Ambulatory Visit: Admitting: Nurse Practitioner

## 2024-02-15 VITALS — BP 118/80 | HR 71 | Temp 97.7°F | Ht 60.0 in | Wt 152.0 lb

## 2024-02-15 DIAGNOSIS — E282 Polycystic ovarian syndrome: Secondary | ICD-10-CM | POA: Diagnosis not present

## 2024-02-15 DIAGNOSIS — F411 Generalized anxiety disorder: Secondary | ICD-10-CM | POA: Diagnosis not present

## 2024-02-15 DIAGNOSIS — N926 Irregular menstruation, unspecified: Secondary | ICD-10-CM

## 2024-02-15 LAB — POCT URINE PREGNANCY: Preg Test, Ur: NEGATIVE

## 2024-02-15 NOTE — Assessment & Plan Note (Signed)
 Generalized anxiety disorder Generalized anxiety disorder improved with Zoloft . Initial side effects noted but overall beneficial. No self-harm ideation. Concentration issues persist, specialist evaluation scheduled. - Continue Zoloft  25mg /day - Evaluate attention deficit with specialist.

## 2024-02-15 NOTE — Assessment & Plan Note (Signed)
 Irregular menstrual cycles Irregular menstrual cycles with negative pregnancy test. Possible causes include PCOS and Zoloft  side effects. Irregular periods not common recently. - Monitor menstrual cycle for irregularities. - Reach out with any concerns

## 2024-02-15 NOTE — Progress Notes (Signed)
 Established Patient Office Visit  Subjective   Patient ID: Judy Cook, female    DOB: 12-Mar-2001  Age: 23 y.o. MRN: [id number]  Chief Complaint  Patient presents with   Anxiety    Discussed the use of AI scribe software for clinical note transcription with the patient, who gave verbal consent to proceed.  History of Present Illness Judy Cook is a 23 year old female with PCOS who presents for a follow-up visit regarding mood and menstrual irregularities.  GAD - Started on Zoloft  25mg /day at last office visit - Mood improvement  - Initial side effects of headaches and decreased appetite with Zoloft , side effects no longer occurring - Difficulty concentrating persists - No thoughts of self-harm or harm to others - Appointment scheduled with a specialist for further evaluation of concentration difficulties for next week  Menstrual irregularity - Missed menstrual period, which was due on February 07, 2024 - Last menstrual period occurred on January 08, 2024 - Attributes irregularity to PCOS and considers Zoloft  as a potential contributing factor      ROS: see HPI    Objective:     BP 118/80   Pulse 71   Temp 97.7 F (36.5 C) (Temporal)   Ht 5' (1.524 m)   Wt 152 lb (68.9 kg)   LMP 01/08/2024   SpO2 99%   BMI 29.69 kg/m    Physical Exam Vitals reviewed.  Constitutional:      General: She is not in acute distress.    Appearance: Normal appearance.  HENT:     Head: Normocephalic and atraumatic.  Cardiovascular:     Rate and Rhythm: Normal rate.     Pulses: Normal pulses.  Pulmonary:     Effort: Pulmonary effort is normal.  Skin:    General: Skin is warm and dry.  Neurological:     General: No focal deficit present.     Mental Status: She is alert and oriented to person, place, and time.  Psychiatric:        Mood and Affect: Mood normal.        Behavior: Behavior normal.        Judgment: Judgment normal.        01/04/2024    10:47 AM 05/04/2023    9:48 AM 02/01/2023    4:52 PM  PHQ9 SCORE ONLY  PHQ-9 Total Score 0 0  0      Data saved with a previous flowsheet row definition       No data to display           Results for orders placed or performed in visit on 02/15/24  POCT urine pregnancy  Result Value Ref Range   Preg Test, Ur Negative Negative      The ASCVD Risk score (Arnett DK, et al., 2019) failed to calculate for the following reasons:   The 2019 ASCVD risk score is only valid for ages 11 to 77    Assessment & Plan:   Problem List Items Addressed This Visit       Endocrine   Polycystic ovary syndrome   Irregular menstrual cycles Irregular menstrual cycles with negative pregnancy test. Possible causes include PCOS and Zoloft  side effects. Irregular periods not common recently. - Monitor menstrual cycle for irregularities. - Reach out with any concerns        Other   GAD (generalized anxiety disorder)   Generalized anxiety disorder Generalized anxiety disorder improved with Zoloft . Initial side effects noted but overall beneficial.  No self-harm ideation. Concentration issues persist, specialist evaluation scheduled. - Continue Zoloft  25mg /day - Evaluate attention deficit with specialist.      Other Visit Diagnoses       Missed period    -  Primary   Relevant Orders   POCT urine pregnancy (Completed)     Assessment and Plan Assessment & Plan Generalized anxiety disorder Generalized anxiety disorder improved with Zoloft . Initial side effects noted but overall beneficial. No self-harm ideation. Concentration issues persist, specialist evaluation scheduled. - Continue Zoloft  25mg /day - Evaluate attention deficit with specialist.  Irregular menstrual cycles Irregular menstrual cycles with negative pregnancy test. Possible causes include PCOS and Zoloft  side effects. Irregular periods not common recently. - Monitor menstrual cycle for irregularities. - Reach out with any  concerns    Return in about 6 months (around 08/15/2024) for CPE with Lauraine.    Lauraine FORBES Pereyra, NP

## 2024-02-18 ENCOUNTER — Ambulatory Visit (HOSPITAL_COMMUNITY): Payer: Self-pay | Admitting: Family

## 2024-03-03 DIAGNOSIS — E1065 Type 1 diabetes mellitus with hyperglycemia: Secondary | ICD-10-CM | POA: Diagnosis not present

## 2024-03-06 ENCOUNTER — Other Ambulatory Visit (HOSPITAL_BASED_OUTPATIENT_CLINIC_OR_DEPARTMENT_OTHER): Payer: Self-pay | Admitting: Obstetrics & Gynecology

## 2024-03-06 DIAGNOSIS — R102 Pelvic and perineal pain unspecified side: Secondary | ICD-10-CM

## 2024-03-15 DIAGNOSIS — R1111 Vomiting without nausea: Secondary | ICD-10-CM | POA: Diagnosis not present

## 2024-03-15 DIAGNOSIS — R531 Weakness: Secondary | ICD-10-CM | POA: Diagnosis not present

## 2024-03-15 DIAGNOSIS — R110 Nausea: Secondary | ICD-10-CM | POA: Diagnosis not present

## 2024-03-15 DIAGNOSIS — E10649 Type 1 diabetes mellitus with hypoglycemia without coma: Secondary | ICD-10-CM | POA: Diagnosis not present

## 2024-03-15 DIAGNOSIS — E11649 Type 2 diabetes mellitus with hypoglycemia without coma: Secondary | ICD-10-CM | POA: Diagnosis not present

## 2024-03-15 DIAGNOSIS — Z9641 Presence of insulin pump (external) (internal): Secondary | ICD-10-CM | POA: Diagnosis not present

## 2024-03-15 NOTE — ED Notes (Signed)
 Pt hypoglycemia and given 3 OJ and 2 peanut butter servings. Pt airway patent, denies any SOB or other complaints. Pt having periods of rebound hypoglycemia.    Annabella Etha Minder, RN 03/15/24 [phone]

## 2024-03-15 NOTE — ED Triage Notes (Signed)
 [facility] transporting 23 yo female CAOx4 from home c/o hypoglycemia. Pt reports glucose as 200 and took 6 units of insulin . Pt reports that she typically takes 1-5 units however she was at a birthday party and eating cake so she felt like she needed it. Pt glucose 86 and stomach cramps, glucose 77 PTA to ED. CBG 73 in ED. Pt appears drowsy. Pt denies any CP, HA, or recent illness.

## 2024-03-15 NOTE — ED Provider Notes (Signed)
 [facility] Emergency Department Provider Note  MRN: [id number] Chief Complaint:  Chief Complaint  Patient presents with  . Hypoglycemia    History and Review of Systems  Judy Cook is a 23 y.o. year-old female with a medical history discussed below presenting to the ED with chief complaint as above.   This is a 23 year old female who presents with concerns of hypoglcyemia. She Is a T1DM and has an insulin  pump. She gave herself extra insulin  in addition to what her pump provides bc she was eating cupcakes at a birthday party. She was driving home and felt weak, nauseous and like her sugar was low. She started to feel out of it so she pulled over and called 911.  Blood sugar in 70s with EMS. 60s on arrival here.   No other symptoms now after eating on arrival to ED. The insulin  she took was novolog .   Review of Systems A pertinent review of systems was obtained and was negative except as noted in the HPI and MDM. Surgical History[1] Medical History[2]  Physical Exam  Physical Exam Vitals and nursing note reviewed.  Constitutional:      Appearance: She is not ill-appearing.  HENT:     Head: Normocephalic and atraumatic.     Mouth/Throat:     Mouth: Mucous membranes are moist.     Pharynx: Oropharynx is clear.  Eyes:     Extraocular Movements: Extraocular movements intact.  Cardiovascular:     Rate and Rhythm: Normal rate.     Heart sounds: Normal heart sounds.  Pulmonary:     Effort: Pulmonary effort is normal.     Breath sounds: Normal breath sounds.  Abdominal:     General: Abdomen is flat. There is no distension.     Palpations: Abdomen is soft.     Tenderness: There is no abdominal tenderness.  Musculoskeletal:        General: Normal range of motion.     Cervical back: Normal range of motion.  Skin:    General: Skin is warm and dry.     Capillary Refill: Capillary refill takes less than 2 seconds.     Findings: No rash.  Neurological:     General: No focal  deficit present.     Mental Status: She is alert. Mental status is at baseline.  Psychiatric:        Mood and Affect: Mood normal.        Behavior: Behavior normal.       Procedures   Procedures  ED MEDS: Medications - No data to display     Medical Decision Making   ED Course as of 03/16/24 0749  Sat Mar 15, 2024  1708 Reviewed patient's most recent note with endocrinology on 9/18. [AZ]  1708 Insulin  type   Novolog     Basal rate   0.8 u/h        I:C ratio        0000 1:1  #8 g with a meal,# 4 g with a snack [AZ]  1709 Her insulin  regimen as above.  She uses a pump.  She gave herself an additional 6 units today because she was eating something sugary at a birthday party and that she might need more and then started having symptoms of nausea gagging and was concern for hypoglycemia so she called EMS. [AZ]  1709 She is initially hypoglycemic here to 64.  Labs are ordered.  Patient given 6 dextrose  oral gel and a  meal.  We will reevaluate her blood sugar. [AZ]    ED Course User Index [AZ] Paulina Earnie Easterly, MD    Cbc without significant leukocytosis or anemia. Chemistry without AKI, anion gap, signs of dehydration, or other significant electrolyte or metabolic derrangements.   On reevlauation patients blood sugar in 130. No ongoing symptoms. She asked for some zofran  for home in case nausea returns. Prescribed.   Strict return precautions were provided to the patient. Patient was encouraged to return to the ED should they experience worsening or persistence of current symptoms, or should they develop new concerning symptoms including but not limited to chest pain, shortness of breath, dizziness, syncope, focal weakness, or inability to tolerate po. Encouraged them to f/u with their PCP on an outpatient basis. Questions regarding the diagnosis were answered, and side effects regarding therapies were provided in writing or orally. Patient discharged in stable condition.   Ddx  includes but not limited to: hypoglycemia could be 2/2 infection, poor po intake, inappropriate insulin  use   Clinical Complexity:  I reviewed nursing and triage notes  I personally ordered and reviewed the patient's Labs  and my interpretation is as above in ED course  Interventions ordered and performed by myself as noted in ED course  Patient management required discussion with the following services or consulting groups as above in ED course: none   Past medical/surgical history that increases complexity of ED encounter:  T1DM  Further history obtained from: Clinic note as stated in MDM and HPI  Factors Impacting ED Encounter Risk: Discussion regarding prescription drug management  Patient's presentation is most consistent with acute presentation with potential threat to life or bodily function.  Final Clinical Impressions(s) 1. Hypoglycemia     ED Disposition:  Discharge   ED Prescriptions     Medication Sig Dispense Start Date End Date Auth. Provider   ondansetron  (ZOFRAN -ODT) 4 mg disintegrating tablet Dissolve 1 tablet (4 mg total) on tongue every 8 (eight) hours as needed for nausea or vomiting. 15 tablet 03/15/2024 03/20/2024 Paulina Earnie Easterly, MD             [1] No past surgical history on file. [2] Past Medical History: Diagnosis Date  . Other Resolved: [id number]   MRSA (methicillin resistant Staphylococcus aureus)

## 2024-03-18 ENCOUNTER — Emergency Department (HOSPITAL_COMMUNITY)

## 2024-03-18 ENCOUNTER — Other Ambulatory Visit: Payer: Self-pay

## 2024-03-18 ENCOUNTER — Inpatient Hospital Stay (HOSPITAL_COMMUNITY)
Admission: EM | Admit: 2024-03-18 | Discharge: 2024-03-21 | DRG: 074 | Disposition: A | Discharge status: Home / Self Care | Attending: Internal Medicine | Admitting: Internal Medicine

## 2024-03-18 ENCOUNTER — Encounter (HOSPITAL_COMMUNITY): Payer: Self-pay | Admitting: Family Medicine

## 2024-03-18 DIAGNOSIS — Z87891 Personal history of nicotine dependence: Secondary | ICD-10-CM

## 2024-03-18 DIAGNOSIS — R1115 Cyclical vomiting syndrome unrelated to migraine: Secondary | ICD-10-CM | POA: Diagnosis present

## 2024-03-18 DIAGNOSIS — E1065 Type 1 diabetes mellitus with hyperglycemia: Secondary | ICD-10-CM | POA: Diagnosis not present

## 2024-03-18 DIAGNOSIS — R1030 Lower abdominal pain, unspecified: Secondary | ICD-10-CM | POA: Diagnosis not present

## 2024-03-18 DIAGNOSIS — K449 Diaphragmatic hernia without obstruction or gangrene: Secondary | ICD-10-CM | POA: Diagnosis not present

## 2024-03-18 DIAGNOSIS — R1110 Vomiting, unspecified: Secondary | ICD-10-CM

## 2024-03-18 DIAGNOSIS — R1116 Cannabis hyperemesis syndrome: Secondary | ICD-10-CM | POA: Diagnosis present

## 2024-03-18 DIAGNOSIS — R110 Nausea: Secondary | ICD-10-CM | POA: Diagnosis not present

## 2024-03-18 DIAGNOSIS — R9431 Abnormal electrocardiogram [ECG] [EKG]: Secondary | ICD-10-CM | POA: Diagnosis not present

## 2024-03-18 DIAGNOSIS — E282 Polycystic ovarian syndrome: Secondary | ICD-10-CM | POA: Diagnosis present

## 2024-03-18 DIAGNOSIS — Z8616 Personal history of COVID-19: Secondary | ICD-10-CM

## 2024-03-18 DIAGNOSIS — Z9641 Presence of insulin pump (external) (internal): Secondary | ICD-10-CM | POA: Diagnosis present

## 2024-03-18 DIAGNOSIS — R112 Nausea with vomiting, unspecified: Secondary | ICD-10-CM | POA: Diagnosis not present

## 2024-03-18 DIAGNOSIS — Z79899 Other long term (current) drug therapy: Secondary | ICD-10-CM

## 2024-03-18 DIAGNOSIS — R1084 Generalized abdominal pain: Secondary | ICD-10-CM | POA: Diagnosis not present

## 2024-03-18 DIAGNOSIS — F1910 Other psychoactive substance abuse, uncomplicated: Secondary | ICD-10-CM | POA: Diagnosis present

## 2024-03-18 DIAGNOSIS — Z794 Long term (current) use of insulin: Secondary | ICD-10-CM

## 2024-03-18 DIAGNOSIS — Z818 Family history of other mental and behavioral disorders: Secondary | ICD-10-CM

## 2024-03-18 DIAGNOSIS — Z83438 Family history of other disorder of lipoprotein metabolism and other lipidemia: Secondary | ICD-10-CM

## 2024-03-18 DIAGNOSIS — K219 Gastro-esophageal reflux disease without esophagitis: Secondary | ICD-10-CM | POA: Diagnosis present

## 2024-03-18 DIAGNOSIS — E1043 Type 1 diabetes mellitus with diabetic autonomic (poly)neuropathy: Principal | ICD-10-CM | POA: Diagnosis present

## 2024-03-18 DIAGNOSIS — Z833 Family history of diabetes mellitus: Secondary | ICD-10-CM

## 2024-03-18 DIAGNOSIS — Z8249 Family history of ischemic heart disease and other diseases of the circulatory system: Secondary | ICD-10-CM

## 2024-03-18 DIAGNOSIS — E1010 Type 1 diabetes mellitus with ketoacidosis without coma: Secondary | ICD-10-CM | POA: Diagnosis present

## 2024-03-18 DIAGNOSIS — R109 Unspecified abdominal pain: Principal | ICD-10-CM

## 2024-03-18 DIAGNOSIS — F411 Generalized anxiety disorder: Secondary | ICD-10-CM | POA: Diagnosis not present

## 2024-03-18 DIAGNOSIS — Z813 Family history of other psychoactive substance abuse and dependence: Secondary | ICD-10-CM

## 2024-03-18 DIAGNOSIS — K3184 Gastroparesis: Secondary | ICD-10-CM | POA: Diagnosis present

## 2024-03-18 DIAGNOSIS — Z811 Family history of alcohol abuse and dependence: Secondary | ICD-10-CM

## 2024-03-18 LAB — CBC WITH DIFFERENTIAL/PLATELET
Abs Immature Granulocytes: 0.02 K/uL (ref 0.00–0.07)
Basophils Absolute: 0 K/uL (ref 0.0–0.1)
Basophils Relative: 0 %
Eosinophils Absolute: 0.2 K/uL (ref 0.0–0.5)
Eosinophils Relative: 3 %
HCT: 41.7 % (ref 36.0–46.0)
Hemoglobin: 14.4 g/dL (ref 12.0–15.0)
Immature Granulocytes: 0 %
Lymphocytes Relative: 39 %
Lymphs Abs: 3 K/uL (ref 0.7–4.0)
MCH: 32.1 pg (ref 26.0–34.0)
MCHC: 34.5 g/dL (ref 30.0–36.0)
MCV: 93.1 fL (ref 80.0–100.0)
Monocytes Absolute: 0.6 K/uL (ref 0.1–1.0)
Monocytes Relative: 8 %
Neutro Abs: 3.9 K/uL (ref 1.7–7.7)
Neutrophils Relative %: 50 %
Platelets: 456 K/uL — ABNORMAL HIGH (ref 150–400)
RBC: 4.48 MIL/uL (ref 3.87–5.11)
RDW: 13.2 % (ref 11.5–15.5)
WBC: 7.7 K/uL (ref 4.0–10.5)
nRBC: 0 % (ref 0.0–0.2)

## 2024-03-18 LAB — CBG MONITORING, ED
Glucose-Capillary: 124 mg/dL — ABNORMAL HIGH (ref 70–99)
Glucose-Capillary: 253 mg/dL — ABNORMAL HIGH (ref 70–99)

## 2024-03-18 LAB — URINALYSIS, ROUTINE W REFLEX MICROSCOPIC
Bacteria, UA: NONE SEEN
Bilirubin Urine: NEGATIVE
Glucose, UA: 150 mg/dL — AB
Ketones, ur: 20 mg/dL — AB
Leukocytes,Ua: NEGATIVE
Nitrite: NEGATIVE
Protein, ur: 30 mg/dL — AB
Specific Gravity, Urine: 1.016 (ref 1.005–1.030)
pH: 7 (ref 5.0–8.0)

## 2024-03-18 LAB — I-STAT CHEM 8, ED
BUN: 5 mg/dL — ABNORMAL LOW (ref 6–20)
Calcium, Ion: 0.86 mmol/L — CL (ref 1.15–1.40)
Chloride: 114 mmol/L — ABNORMAL HIGH (ref 98–111)
Creatinine, Ser: 0.7 mg/dL (ref 0.44–1.00)
Glucose, Bld: 133 mg/dL — ABNORMAL HIGH (ref 70–99)
HCT: 35 % — ABNORMAL LOW (ref 36.0–46.0)
Hemoglobin: 11.9 g/dL — ABNORMAL LOW (ref 12.0–15.0)
Potassium: 2.9 mmol/L — ABNORMAL LOW (ref 3.5–5.1)
Sodium: 142 mmol/L (ref 135–145)
TCO2: 17 mmol/L — ABNORMAL LOW (ref 22–32)

## 2024-03-18 LAB — COMPREHENSIVE METABOLIC PANEL WITH GFR
ALT: 12 U/L (ref 0–44)
AST: 22 U/L (ref 15–41)
Albumin: 4.4 g/dL (ref 3.5–5.0)
Alkaline Phosphatase: 76 U/L (ref 38–126)
Anion gap: 14 (ref 5–15)
BUN: 8 mg/dL (ref 6–20)
CO2: 21 mmol/L — ABNORMAL LOW (ref 22–32)
Calcium: 9.4 mg/dL (ref 8.9–10.3)
Chloride: 105 mmol/L (ref 98–111)
Creatinine, Ser: 0.92 mg/dL (ref 0.44–1.00)
GFR, Estimated: >60 mL/min (ref >60)
Glucose, Bld: 154 mg/dL — ABNORMAL HIGH (ref 70–99)
Potassium: 3.9 mmol/L (ref 3.5–5.1)
Sodium: 139 mmol/L (ref 135–145)
Total Bilirubin: 0.3 mg/dL (ref 0.0–1.2)
Total Protein: 7.7 g/dL (ref 6.5–8.1)

## 2024-03-18 LAB — GLUCOSE, CAPILLARY: Glucose-Capillary: 391 mg/dL — ABNORMAL HIGH (ref 70–99)

## 2024-03-18 LAB — HCG, SERUM, QUALITATIVE: Preg, Serum: NEGATIVE

## 2024-03-18 LAB — LIPASE, BLOOD: Lipase: 14 U/L (ref 11–51)

## 2024-03-18 MED ORDER — METOCLOPRAMIDE HCL 5 MG/ML IJ SOLN
10.0000 mg | Freq: Once | INTRAMUSCULAR | Status: AC
  Administered 2024-03-18: 10 mg via INTRAVENOUS
  Filled 2024-03-18: qty 2

## 2024-03-18 MED ORDER — SODIUM CHLORIDE 0.9 % IV BOLUS
1000.0000 mL | Freq: Once | INTRAVENOUS | Status: AC
  Administered 2024-03-18: 1000 mL via INTRAVENOUS

## 2024-03-18 MED ORDER — ONDANSETRON HCL 4 MG PO TABS
4.0000 mg | ORAL_TABLET | Freq: Four times a day (QID) | ORAL | Status: DC | PRN
Start: 2024-03-18 — End: 2024-03-21

## 2024-03-18 MED ORDER — PROCHLORPERAZINE EDISYLATE 10 MG/2ML IJ SOLN
5.0000 mg | Freq: Four times a day (QID) | INTRAMUSCULAR | Status: DC | PRN
Start: 2024-03-18 — End: 2024-03-20
  Administered 2024-03-18 – 2024-03-19 (×4): 5 mg via INTRAVENOUS
  Filled 2024-03-18 (×4): qty 2

## 2024-03-18 MED ORDER — ENOXAPARIN SODIUM 40 MG/0.4ML IJ SOSY
40.0000 mg | PREFILLED_SYRINGE | INTRAMUSCULAR | Status: DC
  Administered 2024-03-18 – 2024-03-19 (×2): 40 mg via SUBCUTANEOUS
  Filled 2024-03-18 (×2): qty 0.4

## 2024-03-18 MED ORDER — SENNOSIDES-DOCUSATE SODIUM 8.6-50 MG PO TABS: 1.0000 | ORAL_TABLET | Freq: Every evening | ORAL | Status: DC | PRN

## 2024-03-18 MED ORDER — PANTOPRAZOLE SODIUM 40 MG IV SOLR
40.0000 mg | Freq: Once | INTRAVENOUS | Status: AC
  Administered 2024-03-18: 40 mg via INTRAVENOUS
  Filled 2024-03-18: qty 10

## 2024-03-18 MED ORDER — IOHEXOL 300 MG/ML  SOLN
100.0000 mL | Freq: Once | INTRAMUSCULAR | Status: AC | PRN
  Administered 2024-03-18: 100 mL via INTRAVENOUS

## 2024-03-18 MED ORDER — METOCLOPRAMIDE HCL 5 MG/ML IJ SOLN
10.0000 mg | Freq: Three times a day (TID) | INTRAMUSCULAR | Status: DC
  Administered 2024-03-19 – 2024-03-20 (×5): 10 mg via INTRAVENOUS
  Filled 2024-03-18 (×5): qty 2

## 2024-03-18 MED ORDER — LORAZEPAM 2 MG/ML IJ SOLN
1.0000 mg | Freq: Once | INTRAMUSCULAR | Status: AC
  Administered 2024-03-18: 1 mg via INTRAVENOUS
  Filled 2024-03-18: qty 1

## 2024-03-18 MED ORDER — KETOROLAC TROMETHAMINE 15 MG/ML IJ SOLN
15.0000 mg | Freq: Four times a day (QID) | INTRAMUSCULAR | Status: DC | PRN
  Administered 2024-03-18 – 2024-03-19 (×2): 15 mg via INTRAVENOUS
  Administered 2024-03-19: 30 mg via INTRAVENOUS
  Administered 2024-03-19: 15 mg via INTRAVENOUS
  Administered 2024-03-19: 30 mg via INTRAVENOUS
  Administered 2024-03-19 – 2024-03-20 (×2): 15 mg via INTRAVENOUS
  Administered 2024-03-20: 30 mg via INTRAVENOUS
  Filled 2024-03-18: qty 1
  Filled 2024-03-18: qty 2
  Filled 2024-03-18 (×2): qty 1
  Filled 2024-03-18: qty 2
  Filled 2024-03-18: qty 1
  Filled 2024-03-18: qty 2
  Filled 2024-03-18: qty 1

## 2024-03-18 MED ORDER — ONDANSETRON HCL 4 MG/2ML IJ SOLN
4.0000 mg | Freq: Four times a day (QID) | INTRAMUSCULAR | Status: DC | PRN
  Administered 2024-03-18 – 2024-03-20 (×7): 4 mg via INTRAVENOUS
  Filled 2024-03-18 (×8): qty 2

## 2024-03-18 MED ORDER — INSULIN PUMP
SUBCUTANEOUS | Status: DC
  Filled 2024-03-18: qty 1

## 2024-03-18 MED ORDER — INSULIN ASPART 100 UNIT/ML IJ SOLN
0.0000 [IU] | Freq: Three times a day (TID) | INTRAMUSCULAR | Status: DC
  Administered 2024-03-19: 11 [IU] via SUBCUTANEOUS
  Filled 2024-03-18: qty 11

## 2024-03-18 MED ORDER — MORPHINE SULFATE (PF) 4 MG/ML IV SOLN
4.0000 mg | Freq: Once | INTRAVENOUS | Status: AC
  Administered 2024-03-18: 4 mg via INTRAVENOUS
  Filled 2024-03-18: qty 1

## 2024-03-18 MED ORDER — DIPHENHYDRAMINE HCL 50 MG/ML IJ SOLN
12.5000 mg | Freq: Once | INTRAMUSCULAR | Status: AC
  Administered 2024-03-18: 12.5 mg via INTRAVENOUS
  Filled 2024-03-18: qty 1

## 2024-03-18 MED ORDER — ACETAMINOPHEN 650 MG RE SUPP: 650.0000 mg | Freq: Four times a day (QID) | RECTAL | Status: DC | PRN

## 2024-03-18 MED ORDER — ACETAMINOPHEN 325 MG PO TABS
650.0000 mg | ORAL_TABLET | Freq: Four times a day (QID) | ORAL | Status: DC | PRN
Start: 2024-03-18 — End: 2024-03-21
  Filled 2024-03-18: qty 2

## 2024-03-18 MED ORDER — SERTRALINE HCL 50 MG PO TABS
25.0000 mg | ORAL_TABLET | Freq: Every day | ORAL | Status: DC
  Administered 2024-03-19 – 2024-03-21 (×3): 25 mg via ORAL
  Filled 2024-03-18 (×3): qty 1

## 2024-03-18 MED ORDER — INSULIN ASPART 100 UNIT/ML IJ SOLN
0.0000 [IU] | Freq: Every day | INTRAMUSCULAR | Status: DC
  Administered 2024-03-18: 5 [IU] via SUBCUTANEOUS
  Filled 2024-03-18: qty 5

## 2024-03-18 MED ORDER — METOCLOPRAMIDE HCL 5 MG/ML IJ SOLN
10.0000 mg | Freq: Three times a day (TID) | INTRAMUSCULAR | Status: DC
  Administered 2024-03-18: 10 mg via INTRAVENOUS
  Filled 2024-03-18: qty 2

## 2024-03-18 MED ORDER — POTASSIUM CHLORIDE 10 MEQ/100ML IV SOLN
10.0000 meq | Freq: Once | INTRAVENOUS | Status: AC
  Administered 2024-03-18: 10 meq via INTRAVENOUS
  Filled 2024-03-18: qty 100

## 2024-03-18 MED ORDER — HALOPERIDOL LACTATE 5 MG/ML IJ SOLN: 2.0000 mg | Freq: Once | INTRAMUSCULAR | Status: DC

## 2024-03-18 MED ORDER — SODIUM CHLORIDE 0.9 % IV SOLN: INTRAVENOUS | Status: AC

## 2024-03-18 MED ORDER — DROPERIDOL 2.5 MG/ML IJ SOLN
0.6250 mg | Freq: Once | INTRAMUSCULAR | Status: AC
  Administered 2024-03-18: 0.625 mg via INTRAVENOUS
  Filled 2024-03-18: qty 2

## 2024-03-18 NOTE — ED Notes (Signed)
 Unsuccessful IV attempt x2.

## 2024-03-18 NOTE — H&P (Signed)
 History and Physical    Judy Cook FMW:[id number] DOB: 08/08/2000 DOA: 03/18/2024  PCP: Elnor Lauraine BRAVO, NP   Patient coming from: Home   Chief Complaint: N/V   HPI: Judy Cook is a 23 y.o. female with medical history significant for type 1 diabetes mellitus, diabetic gastroparesis, polysubstance abuse, anxiety, and PCOS, presenting with nausea and vomiting.  Patient reports that she was experiencing some nausea this morning, smoked marijuana, and then had acute worsening in her symptoms.  She has had numerous episodes of vomiting and has been unable to tolerate anything by mouth.  Symptoms are similar to what she has experienced many times in the past.  There is some abdominal pain associated with this but no fever, chills, or diarrhea.  Gastric emptying was delayed on gastric emptying study in August 2024.  She uses Reglan  at home.  ED Course: Upon arrival to the ED, patient is found to be afebrile and saturating well on room air with normal HR and stable BP.  Labs are most notable for normal creatinine, glucose 154, normal WBC, normal LFTs and lipase, and ketonuria.  There are no acute findings on CT of the abdomen and pelvis.  Patient was treated in the ED with 1 L NS, IV potassium, Benadryl , Ativan , Reglan , and morphine .  Review of Systems:  All other systems reviewed and apart from HPI, are negative.  Past Medical History:  Diagnosis Date   Allergic rhinitis 07/07/2021   COVID 2020   mild   Diabetes mellitus (HCC)    Type 1, type 5   Dysmenorrhea 02/20/2019   GERD (gastroesophageal reflux disease)    Headache    History of herpes genitalis 06/24/2021   Intractable vomiting    SIRS (systemic inflammatory response syndrome) (HCC) 12/03/2019    Past Surgical History:  Procedure Laterality Date   DILATION AND EVACUATION N/A 07/05/2022   Procedure: DILATATION AND EVACUATION;  Surgeon: Izell Harari, MD;  Location: [facility] OR;  Service: Gynecology;   Laterality: N/A;   OPERATIVE ULTRASOUND N/A 07/05/2022   Procedure: OPERATIVE ULTRASOUND;  Surgeon: Izell Harari, MD;  Location: [facility] OR;  Service: Gynecology;  Laterality: N/A;    Social History:   reports that she has quit smoking. Her smoking use included cigarettes. She has never used smokeless tobacco. She reports current alcohol use. She reports current drug use. Drug: Marijuana.  No Known Allergies  Family History  Problem Relation Age of Onset   Diabetes Paternal Grandfather    Heart attack Paternal Grandfather    Diabetes Paternal Grandmother    Cancer Maternal Grandmother        lung   Alcohol abuse Maternal Grandfather    Alcohol abuse Father    Drug abuse Father    High Cholesterol Father    Alcohol abuse Mother    Depression Mother    Drug abuse Mother      Prior to Admission medications   Medication Sig Start Date End Date Taking? Authorizing Provider  cetirizine  (ZYRTEC  ALLERGY) 10 MG tablet Take 1 tablet (10 mg total) by mouth daily. 07/25/23   Christopher Savannah, PA-C  Continuous Glucose Sensor (DEXCOM G7 SENSOR) MISC CHANGE EVERY 10 DAYS 11/05/23   Shamleffer, Ibtehal Jaralla, MD  ibuprofen  (ADVIL ) 800 MG tablet TAKE 1 TABLET BY MOUTH EVERY 8 HOURS AS NEEDED 03/06/24   Cleotilde Ronal RAMAN, MD  metoCLOPramide  (REGLAN ) 5 MG tablet Take 1 tablet (5 mg total) by mouth 3 (three) times daily. 12/21/22 02/15/24  [place], PA-C  Multiple  Vitamins-Minerals (MULTIVITAMIN WITH MINERALS) tablet Take 2 tablets by mouth daily.    [provider]  naproxen sodium (ALEVE) 220 MG tablet Take 440 mg by mouth daily as needed (pain).    [provider]  NOVOLOG  100 UNIT/ML injection MAX DAILY 60 UNITS PER PUMP 07/10/23   Shamleffer, Ibtehal Jaralla, MD  sertraline  (ZOLOFT ) 25 MG tablet Take 1 tablet (25 mg total) by mouth daily. 01/04/24   Elnor Lauraine BRAVO, NP    Physical Exam: Vitals:   03/18/24 1238 03/18/24 1359 03/18/24 1445 03/18/24 1610  BP: (!) 144/53 (!) 137/99 (!)  131/90 (!) 142/85  Pulse: 74 71 73 89  Resp: (!) 22 15 16 16   Temp: 98.6 F (37 C) 97.6 F (36.4 C)    TempSrc: Oral Oral    SpO2: 100% 100% 96% 100%    Constitutional: NAD, no pallor or diaphoresis  Eyes: PERTLA, lids and conjunctivae normal ENMT: Mucous membranes are moist. Posterior pharynx clear of any exudate or lesions.   Neck: supple, no masses  Respiratory: no wheezing, no crackles. No accessory muscle use.  Cardiovascular: S1 & S2 heard, regular rate and rhythm. No extremity edema.  Abdomen: Soft, no guarding. Bowel sounds active.  Musculoskeletal: no clubbing / cyanosis. No joint deformity upper and lower extremities.   Skin: no significant rashes, lesions, ulcers. Warm, dry, well-perfused. Neurologic: CN 2-12 grossly intact. Moving all extremities. Sleeping, wakes to voice and is oriented.  Psychiatric: Calm. Cooperative.    Labs and Imaging on Admission: I have personally reviewed following labs and imaging studies  CBC: Recent Labs  Lab 03/18/24 1310 03/18/24 1449  WBC 7.7  --   NEUTROABS 3.9  --   HGB 14.4 11.9*  HCT 41.7 35.0*  MCV 93.1  --   PLT 456*  --    Basic Metabolic Panel: Recent Labs  Lab 03/18/24 1437 03/18/24 1449  NA 139 142  K 3.9 2.9*  CL 105 114*  CO2 21*  --   GLUCOSE 154* 133*  BUN 8 5*  CREATININE 0.92 0.70  CALCIUM  9.4  --    GFR: CrCl cannot be calculated (Unknown ideal weight.). Liver Function Tests: Recent Labs  Lab 03/18/24 1437  AST 22  ALT 12  ALKPHOS 76  BILITOT 0.3  PROT 7.7  ALBUMIN 4.4   Recent Labs  Lab 03/18/24 1437  LIPASE 14   No results for input(s): AMMONIA in the last 168 hours. Coagulation Profile: No results for input(s): INR, PROTIME in the last 168 hours. Cardiac Enzymes: No results for input(s): CKTOTAL, CKMB, CKMBINDEX, TROPONINI in the last 168 hours. BNP (last 3 results) No results for input(s): PROBNP in the last 8760 hours. HbA1C: No results for input(s): HGBA1C  in the last 72 hours. CBG: Recent Labs  Lab 03/18/24 1403 03/18/24 1604  GLUCAP 124* 253*   Lipid Profile: No results for input(s): CHOL, HDL, LDLCALC, TRIG, CHOLHDL, LDLDIRECT in the last 72 hours. Thyroid  Function Tests: No results for input(s): TSH, T4TOTAL, FREET4, T3FREE, THYROIDAB in the last 72 hours. Anemia Panel: No results for input(s): VITAMINB12, FOLATE, FERRITIN, TIBC, IRON, RETICCTPCT in the last 72 hours. Urine analysis:    Component Value Date/Time   COLORURINE YELLOW 03/18/2024 1518   APPEARANCEUR CLEAR 03/18/2024 1518   LABSPEC 1.016 03/18/2024 1518   PHURINE 7.0 03/18/2024 1518   GLUCOSEU 150 (A) 03/18/2024 1518   GLUCOSEU 250 (A) 09/21/2022 1011   HGBUR MODERATE (A) 03/18/2024 1518   BILIRUBINUR NEGATIVE 03/18/2024 1518  BILIRUBINUR Negative 08/02/2023 1633   KETONESUR 20 (A) 03/18/2024 1518   PROTEINUR 30 (A) 03/18/2024 1518   UROBILINOGEN 0.2 08/02/2023 1633   UROBILINOGEN 0.2 09/21/2022 1011   NITRITE NEGATIVE 03/18/2024 1518   LEUKOCYTESUR NEGATIVE 03/18/2024 1518   Sepsis Labs: @LABRCNTIP (procalcitonin:4,lacticidven:4) )No results found for this or any previous visit (from the past 240 hours).   Radiological Exams on Admission: CT ABDOMEN PELVIS W CONTRAST Result Date: 03/18/2024 EXAM: CT ABDOMEN AND PELVIS WITH CONTRAST 03/18/2024 03:46:28 PM TECHNIQUE: CT of the abdomen and pelvis was performed with the administration of 100 mL of iohexol  (OMNIPAQUE ) 300 MG/ML solution. Multiplanar reformatted images are provided for review. Automated exposure control, iterative reconstruction, and/or weight-based adjustment of the mA/kV was utilized to reduce the radiation dose to as low as reasonably achievable. COMPARISON: 01/17/2023 CLINICAL HISTORY: Nauseate vomiting after smoking marijuana. FINDINGS: LOWER CHEST: Normal heart size. Clear lung bases. LIVER: Normal, without mass or intrahepatic biliary duct dilatation.  GALLBLADDER AND BILE DUCTS: Gallbladder is unremarkable. No biliary ductal dilatation. SPLEEN: Normal in size and morphology. PANCREAS: Normal, without duct dilatation or acute inflammation. ADRENAL GLANDS: No acute abnormality. KIDNEYS, URETERS AND BLADDER: No renal mass or hydronephrosis. Normal urinary bladder. GI AND BOWEL: Normal small bowel caliber. Tiny hiatal hernia. The transverse and ascending colon appear thick walled, but are underdistended. Example image 39 / 2. Normal terminal ileum and appendix. There is no bowel obstruction. PERITONEUM AND RETROPERITONEUM: No ascites. No free air. VASCULATURE: Aorta is normal in caliber. LYMPH NODES: No lymphadenopathy. REPRODUCTIVE ORGANS: Normal uterus, without adnexal mass. BONES AND SOFT TISSUES: No acute osseous abnormality. No focal soft tissue abnormality. IMPRESSION: 1. No acute explanation for nausea and vomiting after smoking marijuana. 2. Apparent colonic wall thickening may be due to underdistention. Correlate with symptoms of infectious colitis, which could look similar. 3. Tiny hiatal hernia. Electronically signed by: Rockey Kilts MD 03/18/2024 04:41 PM EST RP Workstation: [id number]    EKG: Independently reviewed. Sinus arrhythmia.   Assessment/Plan   1. Intractable N/V  - No acute findings on CT; exam is benign; no fevers/chills  - Schedule Reglan , continue IVF hydration, monitor/correct electrolytes, encourage marijuana avoidance, advance diet as she improves    2. Type I DM  - A1c was 6.7% in September 2025  - Continue insulin  pump    3. Anxiety  - Zoloft     DVT prophylaxis: Lovenox   Code Status: Full  Level of Care: Level of care: Med-Surg Family Communication: None present  Disposition Plan:  Patient is from: Home  Anticipated d/c is to: Home  Anticipated d/c date is: 11/26 or 03/20/24 Patient currently: Pending tolerance of adequate oral intake  Consults called: None  Admission status: Observation     Judy GORMAN Sprinkles, MD [facility]  03/18/2024, 6:11 PM

## 2024-03-18 NOTE — ED Triage Notes (Addendum)
 Pt bib ems  due to n/v pt states this started 20 minutes after smoking marijuana. Denies chest pain or SOB Hx hyperglycemia

## 2024-03-18 NOTE — ED Provider Notes (Signed)
 [name] [facility]PART[facility] AT [facility] Provider Note   CSN: [id number] Arrival date & time: 03/18/24  1230     Patient presents with: Emesis and Abdominal Pain   Judy Cook is a 23 y.o. female.   Patient here with nausea and vomiting shortly after smoking marijuana.  Does have history of marijuana hyperemesis and gastroparesis.  Symptoms were little bit bad this morning but worse after smoking.  She denies any fever or chills.  She is not having any major abdominal pain.  Denies any chest pain shortness of breath.  Blood sugar with EMS was in the low 100s.  Typically Reglan  helps with the nausea when he gets this bad.  She has a history of tractable nausea and vomiting.  Takes insulin  and Reglan .  The history is provided by the patient.       Prior to Admission medications   Medication Sig Start Date End Date Taking? Authorizing Provider  cetirizine  (ZYRTEC  ALLERGY) 10 MG tablet Take 1 tablet (10 mg total) by mouth daily. 07/25/23   Christopher Savannah, PA-C  Continuous Glucose Sensor (DEXCOM G7 SENSOR) MISC CHANGE EVERY 10 DAYS 11/05/23   Shamleffer, Ibtehal Jaralla, MD  ibuprofen  (ADVIL ) 800 MG tablet TAKE 1 TABLET BY MOUTH EVERY 8 HOURS AS NEEDED 03/06/24   Cleotilde Ronal RAMAN, MD  metoCLOPramide  (REGLAN ) 5 MG tablet Take 1 tablet (5 mg total) by mouth 3 (three) times daily. 12/21/22 02/15/24  [place], PA-C  Multiple Vitamins-Minerals (MULTIVITAMIN WITH MINERALS) tablet Take 2 tablets by mouth daily.    [provider]  naproxen sodium (ALEVE) 220 MG tablet Take 440 mg by mouth daily as needed (pain).    [provider]  NOVOLOG  100 UNIT/ML injection MAX DAILY 60 UNITS PER PUMP 07/10/23   Shamleffer, Ibtehal Jaralla, MD  sertraline  (ZOLOFT ) 25 MG tablet Take 1 tablet (25 mg total) by mouth daily. 01/04/24   Elnor Lauraine BRAVO, NP    Allergies: Patient has no known allergies.    Review of Systems  Updated Vital Signs BP (!) 137/99 (BP Location:  Left Arm)   Pulse 71   Temp 97.6 F (36.4 C) (Oral)   Resp 15   SpO2 100%   Physical Exam Vitals and nursing note reviewed.  Constitutional:      General: She is in acute distress.     Appearance: She is well-developed. She is ill-appearing.  HENT:     Head: Normocephalic and atraumatic.     Mouth/Throat:     Mouth: Mucous membranes are moist.  Eyes:     Extraocular Movements: Extraocular movements intact.     Conjunctiva/sclera: Conjunctivae normal.  Cardiovascular:     Rate and Rhythm: Normal rate and regular rhythm.     Heart sounds: Normal heart sounds. No murmur heard. Pulmonary:     Effort: Pulmonary effort is normal. No respiratory distress.     Breath sounds: Normal breath sounds.  Abdominal:     Palpations: Abdomen is soft.     Tenderness: There is no abdominal tenderness.  Musculoskeletal:        General: No swelling.     Cervical back: Neck supple.  Skin:    General: Skin is warm and dry.     Capillary Refill: Capillary refill takes less than 2 seconds.  Neurological:     Mental Status: She is alert.  Psychiatric:        Mood and Affect: Mood normal.     (all labs ordered are  listed, but only abnormal results are displayed) Labs Reviewed  CBC WITH DIFFERENTIAL/PLATELET - Abnormal; Notable for the following components:      Result Value   Platelets 456 (*)    All other components within normal limits  CBG MONITORING, ED - Abnormal; Notable for the following components:   Glucose-Capillary 124 (*)    All other components within normal limits  HCG, SERUM, QUALITATIVE  URINALYSIS, ROUTINE W REFLEX MICROSCOPIC  COMPREHENSIVE METABOLIC PANEL WITH GFR  LIPASE, BLOOD  I-STAT CHEM 8, ED    EKG: EKG Interpretation Date/Time:  Tuesday March 18 2024 12:46:38 EST Ventricular Rate:  69 PR Interval:  154 QRS Duration:  84 QT Interval:  403 QTC Calculation: 432 R Axis:   76  Text Interpretation: Normal sinus rhythm Confirmed by Ruthe Cornet [phone])  on 03/18/2024 1:11:05 PM  Radiology: No results found.   Procedures   Medications Ordered in the ED  sodium chloride  0.9 % bolus 1,000 mL (has no administration in time range)  sodium chloride  0.9 % bolus 1,000 mL (1,000 mLs Intravenous New Bag/Given 03/18/24 1321)  morphine  (PF) 4 MG/ML injection 4 mg (4 mg Intravenous Given 03/18/24 1315)  diphenhydrAMINE  (BENADRYL ) injection 12.5 mg (12.5 mg Intravenous Given 03/18/24 1314)  metoCLOPramide  (REGLAN ) injection 10 mg (10 mg Intravenous Given 03/18/24 1314)  droperidol  (INAPSINE ) 2.5 MG/ML injection 0.625 mg (0.625 mg Intravenous Given 03/18/24 1432)                                    Medical Decision Making Amount and/or Complexity of Data Reviewed Labs: ordered. Radiology: ordered.  Risk Prescription drug management.   Judy Cook is here with nausea vomiting and.  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  Blood sugar was normal with EMS.  Symptoms got worse after smoking marijuana this morning.  She has normal vitals.  No fever.  She is uncomfortable appearing.  Will give her cocktail of Reglan  Benadryl  and morphine  fluid bolus check basic labs.  Differential diagnosis likely gastroparesis/cyclical vomiting syndrome likely in the setting of marijuana, chronic process.  I have low suspicion for acute intra-abdominal process at this time.  Not having any abdominal pain.  Vital signs are reassuring.  Will reevaluate.  On reevaluation she is having some lower abdominal pain.  Symptoms have mildly improved.  Thus far lab work shows no significant leukocytosis or anemia.  Pregnancy test negative.  Will get a CT scan abdomen pelvis.  Will give her Haldol  for further supportive care.  Overall patient handed off to oncoming ED staff patient pending remaining lab work CMP lipase urinalysis CT scan abdomen pelvis.  Please see their note for further results evaluation and disposition of  the patient.  This chart was dictated using voice recognition software.  Despite best efforts to proofread,  errors can occur which can change the documentation meaning.      Final diagnoses:  Abdominal pain, unspecified abdominal location  Hyperemesis    ED Discharge Orders     None          Ruthe Cornet, DO 03/18/24 1438

## 2024-03-18 NOTE — ED Provider Notes (Signed)
 Care was taken over from Dr. Ruthe.  Patient presented with nausea vomiting abdominal pain.  She has a history of cyclical vomiting syndrome.  CT scan does not show any acute abnormality.  Labs are nonconcerning.  She has ongoing vomiting despite treatment in the ED.  Discussed with Dr. Charlton he will admit the patient for further treatment.   Lenor Hollering, MD 03/18/24 [phone]

## 2024-03-19 DIAGNOSIS — E1010 Type 1 diabetes mellitus with ketoacidosis without coma: Secondary | ICD-10-CM | POA: Diagnosis present

## 2024-03-19 DIAGNOSIS — E1065 Type 1 diabetes mellitus with hyperglycemia: Secondary | ICD-10-CM | POA: Diagnosis not present

## 2024-03-19 DIAGNOSIS — F411 Generalized anxiety disorder: Secondary | ICD-10-CM | POA: Diagnosis present

## 2024-03-19 DIAGNOSIS — R112 Nausea with vomiting, unspecified: Secondary | ICD-10-CM | POA: Diagnosis not present

## 2024-03-19 LAB — GLUCOSE, CAPILLARY
Glucose-Capillary: 120 mg/dL — ABNORMAL HIGH (ref 70–99)
Glucose-Capillary: 128 mg/dL — ABNORMAL HIGH (ref 70–99)
Glucose-Capillary: 141 mg/dL — ABNORMAL HIGH (ref 70–99)
Glucose-Capillary: 143 mg/dL — ABNORMAL HIGH (ref 70–99)
Glucose-Capillary: 146 mg/dL — ABNORMAL HIGH (ref 70–99)
Glucose-Capillary: 150 mg/dL — ABNORMAL HIGH (ref 70–99)
Glucose-Capillary: 158 mg/dL — ABNORMAL HIGH (ref 70–99)
Glucose-Capillary: 180 mg/dL — ABNORMAL HIGH (ref 70–99)
Glucose-Capillary: 188 mg/dL — ABNORMAL HIGH (ref 70–99)
Glucose-Capillary: 260 mg/dL — ABNORMAL HIGH (ref 70–99)
Glucose-Capillary: 311 mg/dL — ABNORMAL HIGH (ref 70–99)
Glucose-Capillary: 378 mg/dL — ABNORMAL HIGH (ref 70–99)

## 2024-03-19 LAB — CBC
HCT: 39.5 % (ref 36.0–46.0)
Hemoglobin: 13.5 g/dL (ref 12.0–15.0)
MCH: 32.1 pg (ref 26.0–34.0)
MCHC: 34.2 g/dL (ref 30.0–36.0)
MCV: 93.8 fL (ref 80.0–100.0)
Platelets: 375 K/uL (ref 150–400)
RBC: 4.21 MIL/uL (ref 3.87–5.11)
RDW: 13.4 % (ref 11.5–15.5)
WBC: 13.9 K/uL — ABNORMAL HIGH (ref 4.0–10.5)
nRBC: 0 % (ref 0.0–0.2)

## 2024-03-19 LAB — BASIC METABOLIC PANEL WITH GFR
Anion gap: 21 — ABNORMAL HIGH (ref 5–15)
Anion gap: 19 — ABNORMAL HIGH (ref 5–15)
Anion gap: 13 (ref 5–15)
Anion gap: 14 (ref 5–15)
BUN: 13 mg/dL (ref 6–20)
BUN: 13 mg/dL (ref 6–20)
BUN: 11 mg/dL (ref 6–20)
BUN: 10 mg/dL (ref 6–20)
CO2: 14 mmol/L — ABNORMAL LOW (ref 22–32)
CO2: 15 mmol/L — ABNORMAL LOW (ref 22–32)
CO2: 19 mmol/L — ABNORMAL LOW (ref 22–32)
CO2: 19 mmol/L — ABNORMAL LOW (ref 22–32)
Calcium: 9 mg/dL (ref 8.9–10.3)
Calcium: 8.9 mg/dL (ref 8.9–10.3)
Calcium: 8.7 mg/dL — ABNORMAL LOW (ref 8.9–10.3)
Calcium: 8.8 mg/dL — ABNORMAL LOW (ref 8.9–10.3)
Chloride: 100 mmol/L (ref 98–111)
Chloride: 103 mmol/L (ref 98–111)
Chloride: 104 mmol/L (ref 98–111)
Chloride: 103 mmol/L (ref 98–111)
Creatinine, Ser: 0.85 mg/dL (ref 0.44–1.00)
Creatinine, Ser: 0.85 mg/dL (ref 0.44–1.00)
Creatinine, Ser: 0.85 mg/dL (ref 0.44–1.00)
Creatinine, Ser: 0.82 mg/dL (ref 0.44–1.00)
GFR, Estimated: >60 mL/min (ref >60)
GFR, Estimated: >60 mL/min (ref >60)
GFR, Estimated: >60 mL/min (ref >60)
GFR, Estimated: >60 mL/min (ref >60)
Glucose, Bld: 369 mg/dL — ABNORMAL HIGH (ref 70–99)
Glucose, Bld: 175 mg/dL — ABNORMAL HIGH (ref 70–99)
Glucose, Bld: 122 mg/dL — ABNORMAL HIGH (ref 70–99)
Glucose, Bld: 120 mg/dL — ABNORMAL HIGH (ref 70–99)
Potassium: 4.6 mmol/L (ref 3.5–5.1)
Potassium: 4.1 mmol/L (ref 3.5–5.1)
Potassium: 3.5 mmol/L (ref 3.5–5.1)
Potassium: 3.5 mmol/L (ref 3.5–5.1)
Sodium: 135 mmol/L (ref 135–145)
Sodium: 137 mmol/L (ref 135–145)
Sodium: 137 mmol/L (ref 135–145)
Sodium: 136 mmol/L (ref 135–145)

## 2024-03-19 LAB — MRSA NEXT GEN BY PCR, NASAL: MRSA by PCR Next Gen: NOT DETECTED

## 2024-03-19 LAB — BETA-HYDROXYBUTYRIC ACID
Beta-Hydroxybutyric Acid: 3.81 mmol/L — ABNORMAL HIGH (ref 0.05–0.27)
Beta-Hydroxybutyric Acid: 1.56 mmol/L — ABNORMAL HIGH (ref 0.05–0.27)
Beta-Hydroxybutyric Acid: 1.85 mmol/L — ABNORMAL HIGH (ref 0.05–0.27)

## 2024-03-19 LAB — MAGNESIUM: Magnesium: 1.6 mg/dL — ABNORMAL LOW (ref 1.7–2.4)

## 2024-03-19 MED ORDER — CHLORHEXIDINE GLUCONATE CLOTH 2 % EX PADS
6.0000 | MEDICATED_PAD | Freq: Every day | CUTANEOUS | Status: DC
  Administered 2024-03-19 – 2024-03-21 (×2): 6 via TOPICAL

## 2024-03-19 MED ORDER — SODIUM CHLORIDE 0.9 % IV SOLN: INTRAVENOUS | Status: DC | PRN

## 2024-03-19 MED ORDER — INSULIN REGULAR(HUMAN) IN NACL 100-0.9 UT/100ML-% IV SOLN
INTRAVENOUS | Status: DC
  Administered 2024-03-19: 3.2 [IU]/h via INTRAVENOUS
  Filled 2024-03-19 (×2): qty 100

## 2024-03-19 MED ORDER — POTASSIUM CHLORIDE 10 MEQ/100ML IV SOLN
10.0000 meq | INTRAVENOUS | Status: AC
  Administered 2024-03-19 (×2): 10 meq via INTRAVENOUS
  Filled 2024-03-19 (×2): qty 100

## 2024-03-19 MED ORDER — ORAL CARE MOUTH RINSE: 15.0000 mL | OROMUCOSAL | Status: DC | PRN

## 2024-03-19 MED ORDER — INSULIN ASPART 100 UNIT/ML IJ SOLN
4.0000 [IU] | Freq: Once | INTRAMUSCULAR | Status: AC
  Administered 2024-03-19: 4 [IU] via SUBCUTANEOUS
  Filled 2024-03-19: qty 4

## 2024-03-19 MED ORDER — MORPHINE SULFATE (PF) 2 MG/ML IV SOLN
1.0000 mg | Freq: Once | INTRAVENOUS | Status: AC
Start: 2024-03-19 — End: 2024-03-19
  Administered 2024-03-19: 1 mg via INTRAVENOUS
  Filled 2024-03-19: qty 1

## 2024-03-19 MED ORDER — DEXTROSE IN LACTATED RINGERS 5 % IV SOLN: INTRAVENOUS | Status: DC

## 2024-03-19 MED ORDER — HALOPERIDOL LACTATE 5 MG/ML IJ SOLN
2.5000 mg | Freq: Once | INTRAMUSCULAR | Status: AC
  Administered 2024-03-19: 2.5 mg via INTRAVENOUS
  Filled 2024-03-19: qty 1

## 2024-03-19 MED ORDER — DEXTROSE 50 % IV SOLN: 0.0000 mL | INTRAVENOUS | Status: DC | PRN

## 2024-03-19 MED ORDER — MAGNESIUM SULFATE 4 GM/100ML IV SOLN
4.0000 g | Freq: Once | INTRAVENOUS | Status: AC
  Administered 2024-03-19: 4 g via INTRAVENOUS
  Filled 2024-03-19: qty 100

## 2024-03-19 NOTE — Plan of Care (Signed)
   Problem: Health Behavior/Discharge Planning: Goal: Ability to manage health-related needs will improve Outcome: Progressing   Problem: Activity: Goal: Risk for activity intolerance will decrease Outcome: Progressing   Problem: Nutrition: Goal: Adequate nutrition will be maintained Outcome: Progressing   Problem: Coping: Goal: Level of anxiety will decrease Outcome: Progressing

## 2024-03-19 NOTE — Progress Notes (Signed)
 Progress Note   Patient: Judy Cook FMW:[id number] DOB: Mar 31, 2001 DOA: 03/18/2024     0 DOS: the patient was seen and examined on 03/19/2024   Brief hospital course: Judy Cook is a 23 y.o. female with medical history significant for type 1 diabetes mellitus, diabetic gastroparesis, polysubstance abuse, anxiety, and PCOS, presenting with nausea and vomiting.   Patient reports that she was experiencing some nausea this morning, smoked marijuana, and then had acute worsening in her symptoms.  She has had numerous episodes of vomiting and has been unable to tolerate anything by mouth.  Symptoms are similar to what she has experienced many times in the past.  There is some abdominal pain associated with this but no fever, chills, or diarrhea.   Gastric emptying was delayed on gastric emptying study in August 2024.  She uses Reglan  at home. Patient was admitted and initiated on Zofran  and IV fluids.  However this morning patient continued to have nausea vomiting and.  BMP that was done today shows an increased anion back of 21 and blood glucose in the 350s.  Patient was diagnosed of DKA and initiated on Endo protocol and transferred to stepdown unit.  Assessment and Plan:  1. Intractable N/V  - No acute findings on CT; exam is benign; no fevers/chills  - Schedule Reglan , continue IVF hydration, monitor/correct electrolytes, encourage marijuana avoidance, advance diet as she improves   -However if patient does develop DKA initiated on Endo protocol   2. DKA: Patient initially on admission her blood glucose in the 150s and anion gap 14. - This morning patient again had worsening nausea and vomiting repeat BMP shows anion gap of 21 and blood glucose 369 and bicarb levels dropped from 21-14. - Patient diagnosed of DKA initiated on Endo protocol and transferring down to stepdown unit.  Patient will be made n.p.o. with BMP checks every 4 hours and beta-hydroxybutyrate as  well.  3.Type I DM  - A1c was 6.7% in September 2025  - Continue insulin  pump     3 hypomagnesemia: - Magnesium  level is 1.6 replaced with 4 g of magnesium  sulfate.  4. Anxiety  - Zoloft       DVT prophylaxis: Lovenox   Code Status: Full       Subjective: Patient seen and examined bedside today.  Complains of abdominal discomfort, nausea and vomiting.  Physical Exam: Vitals:   03/18/24 1832 03/18/24 2247 03/19/24 0223 03/19/24 0449  BP: 133/86 127/88 (!) 148/82 (!) 143/73  Pulse: 88 89 90 95  Resp: 18 18 17 17   Temp: 98.2 F (36.8 C) 98.2 F (36.8 C) 98.3 F (36.8 C) 98.3 F (36.8 C)  TempSrc: Oral     SpO2: 100% 100% 100% 100%   Constitutional: NAD, no pallor or diaphoresis  Eyes: PERTLA, lids and conjunctivae normal ENMT: Mucous membranes are moist. Posterior pharynx clear of any exudate or lesions.   Neck: supple, no masses  Respiratory: no wheezing, no crackles. No accessory muscle use.  Cardiovascular: S1 & S2 heard, regular rate and rhythm. No extremity edema.  Abdomen: Soft, no guarding. Bowel sounds active.  Musculoskeletal: no clubbing / cyanosis. No joint deformity upper and lower extremities.   Skin: no significant rashes, lesions, ulcers. Warm, dry, well-perfused. Neurologic: CN 2-12 grossly intact. Moving all extremities. Sleeping, wakes to voice and is oriented.  Psychiatric: Calm. Cooperative.    Data Reviewed:  Reviewed BMP  Family Communication: Patient alert and oriented  Disposition: Status is: Inpatient Remains inpatient appropriate because: DKA  Planned Discharge Destination: Home    Time spent: 55 minutes  Author: Ellouise SHAUNNA Ra, MD 03/19/2024 12:11 PM  For on call review [email or web address].

## 2024-03-19 NOTE — Inpatient Diabetes Management (Addendum)
 Inpatient Diabetes Program Recommendations  AACE/ADA: New Consensus Statement on Inpatient Glycemic Control (2015)  Target Ranges:  Prepandial:   less than 140 mg/dL      Peak postprandial:   less than 180 mg/dL (1-2 hours)      Critically ill patients:  140 - 180 mg/dL   Lab Results  Component Value Date   GLUCAP 311 (H) 03/19/2024   HGBA1C 6.7 (A) 01/10/2024    Latest Reference Range & Units 03/18/24 14:37  Sodium 135 - 145 mmol/L 139  Potassium 3.5 - 5.1 mmol/L 3.9  Chloride 98 - 111 mmol/L 105  CO2 22 - 32 mmol/L 21 (L)  Glucose 70 - 99 mg/dL 845 (H)  BUN 6 - 20 mg/dL 8  Creatinine 9.55 - 8.99 mg/dL 9.07  Calcium  8.9 - 10.3 mg/dL 9.4  Anion gap 5 - 15  14  (L): Data is abnormally low (H): Data is abnormally high  Latest Reference Range & Units 03/19/24 04:46  Sodium 135 - 145 mmol/L 135  Potassium 3.5 - 5.1 mmol/L 4.6  Chloride 98 - 111 mmol/L 100  CO2 22 - 32 mmol/L 14 (L)  Glucose 70 - 99 mg/dL 630 (H)  BUN 6 - 20 mg/dL 13  Creatinine 9.55 - 8.99 mg/dL 9.14  Calcium  8.9 - 10.3 mg/dL 9.0  Anion gap 5 - 15  21 (H)  (L): Data is abnormally low (H): Data is abnormally high  Diabetes history: DM1 Outpatient Diabetes medications: Tandem insulin  pump ( see settings below) Current orders for Inpatient glycemic control: Novolog  0-15 units tid, 0-5 units hs  Inpatient Diabetes Program Recommendations:   Spoke with patient regarding insulin  pump. Patient states she took her insulin  pump off in ED per instructions from staff. No basal insulin  given since admission.   Pump   Tandem  Settings   Insulin  type   Novolog      Basal rate          0000 0.8 u/h     0800 1.00                I:C ratio          0000 1:1      #8 g with a meal,# 4 g with a snack                       Sensitivity          0000  50         Goal          0000  120          and page to Dr. Drew and RN regarding patient. Patient will need IV insulin  and then restart her insulin  pump as  appropriate. Patient sees Dr. Sam for endocrinology with last office visit 01/10/24.  Thank you, Ladislao Cohenour E. Mykaila Blunck, RN, MSN, CNS, CDCES  Diabetes Coordinator Inpatient Glycemic Control Team Team Pager [phone] (8am-5pm) 03/19/2024 10:00 AM

## 2024-03-20 DIAGNOSIS — E1010 Type 1 diabetes mellitus with ketoacidosis without coma: Secondary | ICD-10-CM | POA: Diagnosis not present

## 2024-03-20 DIAGNOSIS — R112 Nausea with vomiting, unspecified: Secondary | ICD-10-CM | POA: Diagnosis not present

## 2024-03-20 DIAGNOSIS — F411 Generalized anxiety disorder: Secondary | ICD-10-CM | POA: Diagnosis not present

## 2024-03-20 DIAGNOSIS — E1065 Type 1 diabetes mellitus with hyperglycemia: Secondary | ICD-10-CM | POA: Diagnosis not present

## 2024-03-20 LAB — CBC
HCT: 38.2 % (ref 36.0–46.0)
Hemoglobin: 12.9 g/dL (ref 12.0–15.0)
MCH: 31.6 pg (ref 26.0–34.0)
MCHC: 33.8 g/dL (ref 30.0–36.0)
MCV: 93.6 fL (ref 80.0–100.0)
Platelets: 352 K/uL (ref 150–400)
RBC: 4.08 MIL/uL (ref 3.87–5.11)
RDW: 13.2 % (ref 11.5–15.5)
WBC: 13 K/uL — ABNORMAL HIGH (ref 4.0–10.5)
nRBC: 0 % (ref 0.0–0.2)

## 2024-03-20 LAB — BASIC METABOLIC PANEL WITH GFR
Anion gap: 12 (ref 5–15)
Anion gap: 11 (ref 5–15)
Anion gap: 9 (ref 5–15)
BUN: 7 mg/dL (ref 6–20)
BUN: 6 mg/dL (ref 6–20)
BUN: <5 mg/dL — ABNORMAL LOW (ref 6–20)
CO2: 20 mmol/L — ABNORMAL LOW (ref 22–32)
CO2: 21 mmol/L — ABNORMAL LOW (ref 22–32)
CO2: 22 mmol/L (ref 22–32)
Calcium: 8.7 mg/dL — ABNORMAL LOW (ref 8.9–10.3)
Calcium: 8.5 mg/dL — ABNORMAL LOW (ref 8.9–10.3)
Calcium: 8.9 mg/dL (ref 8.9–10.3)
Chloride: 104 mmol/L (ref 98–111)
Chloride: 106 mmol/L (ref 98–111)
Chloride: 103 mmol/L (ref 98–111)
Creatinine, Ser: 0.77 mg/dL (ref 0.44–1.00)
Creatinine, Ser: 0.73 mg/dL (ref 0.44–1.00)
Creatinine, Ser: 0.66 mg/dL (ref 0.44–1.00)
GFR, Estimated: >60 mL/min (ref >60)
GFR, Estimated: >60 mL/min (ref >60)
GFR, Estimated: >60 mL/min (ref >60)
Glucose, Bld: 124 mg/dL — ABNORMAL HIGH (ref 70–99)
Glucose, Bld: 116 mg/dL — ABNORMAL HIGH (ref 70–99)
Glucose, Bld: 160 mg/dL — ABNORMAL HIGH (ref 70–99)
Potassium: 3.7 mmol/L (ref 3.5–5.1)
Potassium: 3.2 mmol/L — ABNORMAL LOW (ref 3.5–5.1)
Potassium: 3.8 mmol/L (ref 3.5–5.1)
Sodium: 136 mmol/L (ref 135–145)
Sodium: 138 mmol/L (ref 135–145)
Sodium: 134 mmol/L — ABNORMAL LOW (ref 135–145)

## 2024-03-20 LAB — GLUCOSE, CAPILLARY
Glucose-Capillary: 102 mg/dL — ABNORMAL HIGH (ref 70–99)
Glucose-Capillary: 115 mg/dL — ABNORMAL HIGH (ref 70–99)
Glucose-Capillary: 124 mg/dL — ABNORMAL HIGH (ref 70–99)
Glucose-Capillary: 134 mg/dL — ABNORMAL HIGH (ref 70–99)
Glucose-Capillary: 136 mg/dL — ABNORMAL HIGH (ref 70–99)
Glucose-Capillary: 137 mg/dL — ABNORMAL HIGH (ref 70–99)
Glucose-Capillary: 140 mg/dL — ABNORMAL HIGH (ref 70–99)
Glucose-Capillary: 140 mg/dL — ABNORMAL HIGH (ref 70–99)
Glucose-Capillary: 151 mg/dL — ABNORMAL HIGH (ref 70–99)
Glucose-Capillary: 158 mg/dL — ABNORMAL HIGH (ref 70–99)
Glucose-Capillary: 164 mg/dL — ABNORMAL HIGH (ref 70–99)
Glucose-Capillary: 261 mg/dL — ABNORMAL HIGH (ref 70–99)
Glucose-Capillary: 89 mg/dL (ref 70–99)
Glucose-Capillary: 89 mg/dL (ref 70–99)
Glucose-Capillary: 98 mg/dL (ref 70–99)

## 2024-03-20 LAB — BETA-HYDROXYBUTYRIC ACID
Beta-Hydroxybutyric Acid: 1 mmol/L — ABNORMAL HIGH (ref 0.05–0.27)
Beta-Hydroxybutyric Acid: 0.81 mmol/L — ABNORMAL HIGH (ref 0.05–0.27)
Beta-Hydroxybutyric Acid: 0.64 mmol/L — ABNORMAL HIGH (ref 0.05–0.27)

## 2024-03-20 LAB — MAGNESIUM: Magnesium: 2.1 mg/dL (ref 1.7–2.4)

## 2024-03-20 MED ORDER — HALOPERIDOL LACTATE 5 MG/ML IJ SOLN
2.5000 mg | Freq: Once | INTRAMUSCULAR | Status: AC
  Administered 2024-03-20: 2.5 mg via INTRAVENOUS
  Filled 2024-03-20 (×2): qty 1

## 2024-03-20 MED ORDER — ALPRAZOLAM 0.5 MG PO TABS
0.5000 mg | ORAL_TABLET | Freq: Two times a day (BID) | ORAL | Status: DC | PRN
  Administered 2024-03-20 (×2): 0.5 mg via ORAL
  Filled 2024-03-20 (×2): qty 1

## 2024-03-20 MED ORDER — INSULIN GLARGINE-YFGN 100 UNIT/ML ~~LOC~~ SOLN
15.0000 [IU] | Freq: Every day | SUBCUTANEOUS | Status: DC
  Filled 2024-03-20: qty 0.15

## 2024-03-20 MED ORDER — INSULIN GLARGINE-YFGN 100 UNIT/ML ~~LOC~~ SOLN
15.0000 [IU] | Freq: Every day | SUBCUTANEOUS | Status: DC
  Administered 2024-03-20 – 2024-03-21 (×2): 15 [IU] via SUBCUTANEOUS
  Filled 2024-03-20 (×2): qty 0.15

## 2024-03-20 MED ORDER — INSULIN PUMP
Freq: Three times a day (TID) | SUBCUTANEOUS | Status: DC
  Filled 2024-03-20: qty 1

## 2024-03-20 MED ORDER — PROCHLORPERAZINE EDISYLATE 10 MG/2ML IJ SOLN
10.0000 mg | Freq: Four times a day (QID) | INTRAMUSCULAR | Status: DC | PRN
  Administered 2024-03-20 (×2): 10 mg via INTRAVENOUS
  Filled 2024-03-20 (×2): qty 2

## 2024-03-20 MED ORDER — INSULIN ASPART 100 UNIT/ML IJ SOLN
0.0000 [IU] | Freq: Three times a day (TID) | INTRAMUSCULAR | Status: DC
  Administered 2024-03-20 – 2024-03-21 (×2): 11 [IU] via SUBCUTANEOUS
  Filled 2024-03-20 (×2): qty 11

## 2024-03-20 MED ORDER — POTASSIUM CHLORIDE 10 MEQ/100ML IV SOLN
10.0000 meq | INTRAVENOUS | Status: AC
  Administered 2024-03-20 (×3): 10 meq via INTRAVENOUS
  Filled 2024-03-20 (×3): qty 100

## 2024-03-20 NOTE — Progress Notes (Signed)
 Progress Note   Patient: Judy Cook FMW:[id number] DOB: Oct 10, 2000 DOA: 03/18/2024  DOS: the patient was seen and examined on 03/20/2024   Brief hospital course:  23 y.o. female with medical history significant for type 1 diabetes mellitus, diabetic gastroparesis, polysubstance abuse, anxiety, and PCOS, presenting with nausea and vomiting.   Early DKA.  Assessment and Plan:  Intractable nausea vomiting - Likely multifactorial etiology with gastroparesis, possible early DKA, marijuana hyperemesis.  Appears to be showing improvement.  Aggressive IV fluid hydration on board.  Antiemetics.  Likely early diabetic ketoacidosis - On presentation showed controlled glucose.  However had worsening acidemia shortly after.  Was initiated on IV insulin /DKA protocol.  Showed marked improvement in acidemia.  Initial plan to transition back to patient's insulin  pump.  However patient states that she does not have adapter.  Will transition to long-acting insulin  glargine and insulin  sliding scale.  Type 1 diabetes mellitus - Acidemia resolved.  Patient states she does not have adapter for insulin  pump.  Will transition patient to insulin  glargine 15 units daily along with high-dose insulin  sliding scale.  Initiate carb consistent diet.  Hypomagnesemia - Resolved after placement.  Goals of care - Attempting to transition off of IV insulin  to home insulin  regiment.  Unfortunately patient does not have insulin  pump adapter.  Her transitioning to subcu insulin  with insulin  sliding scale.  If patient can tolerate food and glucose/acidemia appears controlled, can consider discharge later this afternoon.    Subjective: Patient feeling improved this morning although bit tired.  Denies any fever, chills, chest pain, vomiting, abdominal pain.  Physical Exam:  Vitals:   03/20/24 0400 03/20/24 0800 03/20/24 0900 03/20/24 1144  BP: (!) 159/105  (!) 137/105   Pulse: 86 86 86   Resp: 20 (!) 21 (!)  22   Temp:  98.7 F (37.1 C)  98.1 F (36.7 C)  TempSrc:  Oral  Oral  SpO2: 100% 100% 100%   Weight:      Height:        GENERAL:  Alert, pleasant, no acute distress, lethargic HEENT:  EOMI CARDIOVASCULAR:  RRR, no murmurs appreciated RESPIRATORY:  Clear to auscultation, no wheezing, rales, or rhonchi GASTROINTESTINAL:  Soft, nontender, nondistended EXTREMITIES:  No LE edema bilaterally NEURO:  No new focal deficits appreciated SKIN:  No rashes noted PSYCH:  Appropriate mood and affect     Data Reviewed:  Imaging Studies: CT ABDOMEN PELVIS W CONTRAST Result Date: 03/18/2024 EXAM: CT ABDOMEN AND PELVIS WITH CONTRAST 03/18/2024 03:46:28 PM TECHNIQUE: CT of the abdomen and pelvis was performed with the administration of 100 mL of iohexol  (OMNIPAQUE ) 300 MG/ML solution. Multiplanar reformatted images are provided for review. Automated exposure control, iterative reconstruction, and/or weight-based adjustment of the mA/kV was utilized to reduce the radiation dose to as low as reasonably achievable. COMPARISON: 01/17/2023 CLINICAL HISTORY: Nauseate vomiting after smoking marijuana. FINDINGS: LOWER CHEST: Normal heart size. Clear lung bases. LIVER: Normal, without mass or intrahepatic biliary duct dilatation. GALLBLADDER AND BILE DUCTS: Gallbladder is unremarkable. No biliary ductal dilatation. SPLEEN: Normal in size and morphology. PANCREAS: Normal, without duct dilatation or acute inflammation. ADRENAL GLANDS: No acute abnormality. KIDNEYS, URETERS AND BLADDER: No renal mass or hydronephrosis. Normal urinary bladder. GI AND BOWEL: Normal small bowel caliber. Tiny hiatal hernia. The transverse and ascending colon appear thick walled, but are underdistended. Example image 39 / 2. Normal terminal ileum and appendix. There is no bowel obstruction. PERITONEUM AND RETROPERITONEUM: No ascites. No free air. VASCULATURE: Aorta is normal  in caliber. LYMPH NODES: No lymphadenopathy. REPRODUCTIVE ORGANS:  Normal uterus, without adnexal mass. BONES AND SOFT TISSUES: No acute osseous abnormality. No focal soft tissue abnormality. IMPRESSION: 1. No acute explanation for nausea and vomiting after smoking marijuana. 2. Apparent colonic wall thickening may be due to underdistention. Correlate with symptoms of infectious colitis, which could look similar. 3. Tiny hiatal hernia. Electronically signed by: Rockey Kilts MD 03/18/2024 04:41 PM EST RP Workstation: [id number]    Results are pending, will review when available.  Previous records (including but not limited to H&P, progress notes, nursing notes, TOC management) were reviewed in assessment of this patient.  Labs: CBC: Recent Labs  Lab 03/18/24 1310 03/18/24 1449 03/19/24 0446 03/20/24 0531  WBC 7.7  --  13.9* 13.0*  NEUTROABS 3.9  --   --   --   HGB 14.4 11.9* 13.5 12.9  HCT 41.7 35.0* 39.5 38.2  MCV 93.1  --  93.8 93.6  PLT 456*  --  375 352   Basic Metabolic Panel: Recent Labs  Lab 03/19/24 0446 03/19/24 1413 03/19/24 1742 03/19/24 2053 03/20/24 0101 03/20/24 0531 03/20/24 0951  NA 135   < > 137 136 136 138 134*  K 4.6   < > 3.5 3.5 3.7 3.2* 3.8  CL 100   < > 104 103 104 106 103  CO2 14*   < > 19* 19* 20* 21* 22  GLUCOSE 369*   < > 122* 120* 124* 116* 160*  BUN 13   < > 11 10 7 6  <5*  CREATININE 0.85   < > 0.85 0.82 0.77 0.73 0.66  CALCIUM  9.0   < > 8.7* 8.8* 8.7* 8.5* 8.9  MG 1.6*  --   --   --   --  2.1  --    < > = values in this interval not displayed.   Liver Function Tests: Recent Labs  Lab 03/18/24 1437  AST 22  ALT 12  ALKPHOS 76  BILITOT 0.3  PROT 7.7  ALBUMIN 4.4   CBG: Recent Labs  Lab 03/20/24 0646 03/20/24 0749 03/20/24 0901 03/20/24 1007 03/20/24 1106  GLUCAP 98 164* 151* 140* 134*    Scheduled Meds:  Chlorhexidine  Gluconate Cloth  6 each Topical Daily   enoxaparin  (LOVENOX ) injection  40 mg Subcutaneous Q24H   insulin  aspart  0-20 Units Subcutaneous TID WC   insulin  glargine-yfgn  15  Units Subcutaneous Daily   insulin  pump   Subcutaneous TID WC, HS, 0200   metoCLOPramide  (REGLAN ) injection  10 mg Intravenous Q8H   sertraline   25 mg Oral Daily   Continuous Infusions:  sodium chloride  Stopped (03/20/24 0828)   PRN Meds:.sodium chloride , acetaminophen  **OR** acetaminophen , dextrose , ketorolac , ondansetron  **OR** ondansetron  (ZOFRAN ) IV, mouth rinse, prochlorperazine , senna-docusate  Family Communication: None at bedside  Disposition: Status is: Inpatient Remains inpatient appropriate because: Type 1 diabetes mellitus     Time spent: 43 minutes  Length of inpatient stay: 1 days  Author: Carliss LELON Canales, DO 03/20/2024 11:52 AM  For on call review [email or web address].

## 2024-03-20 NOTE — Plan of Care (Signed)

## 2024-03-20 NOTE — Progress Notes (Signed)
 Long acting insulin  given, will transition off of endotool and IV insulin  gtt in 2 hours.

## 2024-03-20 NOTE — Hospital Course (Signed)
 23 y.o. female with medical history significant for type 1 diabetes mellitus, diabetic gastroparesis, polysubstance abuse, anxiety, and PCOS, presenting with nausea and vomiting.   Early DKA.   Assessment and Plan:   Intractable nausea vomiting - Likely multifactorial etiology with gastroparesis, possible early DKA, marijuana hyperemesis.  Appears to be showing improvement.  Aggressive IV fluid hydration on board.  Antiemetics.   Likely early diabetic ketoacidosis - On presentation showed controlled glucose.  However had worsening acidemia shortly after.  Was initiated on IV insulin /DKA protocol.  Showed marked improvement in acidemia.  Initial plan to transition back to patient's insulin  pump.  However patient states that she does not have adapter.  Will transition to long-acting insulin  glargine and insulin  sliding scale.   Type 1 diabetes mellitus - Acidemia resolved.  Patient states she does not have adapter for insulin  pump.  Will transition patient to insulin  glargine 15 units daily along with high-dose insulin  sliding scale.  Initiate carb consistent diet.   Hypomagnesemia - Resolved after placement.   Goals of care - Attempting to transition off of IV insulin  to home insulin  regiment.  Unfortunately patient does not have insulin  pump adapter.  Her transitioning to subcu insulin  with insulin  sliding scale.  If patient can tolerate food and glucose/acidemia appears controlled, can consider discharge later this afternoon.

## 2024-03-20 NOTE — Plan of Care (Signed)
 DKA resolved, patient now transitioned to sliding scale insulin . Continued complaints of nausea and discomfort throughout shift. Probable discharge tomorrow.

## 2024-03-21 DIAGNOSIS — R112 Nausea with vomiting, unspecified: Secondary | ICD-10-CM | POA: Diagnosis not present

## 2024-03-21 DIAGNOSIS — F411 Generalized anxiety disorder: Secondary | ICD-10-CM | POA: Diagnosis not present

## 2024-03-21 DIAGNOSIS — E1065 Type 1 diabetes mellitus with hyperglycemia: Secondary | ICD-10-CM | POA: Diagnosis not present

## 2024-03-21 LAB — BASIC METABOLIC PANEL WITH GFR
Anion gap: 9 (ref 5–15)
BUN: <5 mg/dL — ABNORMAL LOW (ref 6–20)
CO2: 25 mmol/L (ref 22–32)
Calcium: 9.2 mg/dL (ref 8.9–10.3)
Chloride: 103 mmol/L (ref 98–111)
Creatinine, Ser: 0.73 mg/dL (ref 0.44–1.00)
GFR, Estimated: >60 mL/min (ref >60)
Glucose, Bld: 153 mg/dL — ABNORMAL HIGH (ref 70–99)
Potassium: 3.4 mmol/L — ABNORMAL LOW (ref 3.5–5.1)
Sodium: 136 mmol/L (ref 135–145)

## 2024-03-21 LAB — CBC
HCT: 40.4 % (ref 36.0–46.0)
Hemoglobin: 13.9 g/dL (ref 12.0–15.0)
MCH: 31.9 pg (ref 26.0–34.0)
MCHC: 34.4 g/dL (ref 30.0–36.0)
MCV: 92.7 fL (ref 80.0–100.0)
Platelets: 417 K/uL — ABNORMAL HIGH (ref 150–400)
RBC: 4.36 MIL/uL (ref 3.87–5.11)
RDW: 13.1 % (ref 11.5–15.5)
WBC: 11 K/uL — ABNORMAL HIGH (ref 4.0–10.5)
nRBC: 0 % (ref 0.0–0.2)

## 2024-03-21 LAB — MAGNESIUM: Magnesium: 2.2 mg/dL (ref 1.7–2.4)

## 2024-03-21 LAB — GLUCOSE, CAPILLARY
Glucose-Capillary: 248 mg/dL — ABNORMAL HIGH (ref 70–99)
Glucose-Capillary: 263 mg/dL — ABNORMAL HIGH (ref 70–99)
Glucose-Capillary: 294 mg/dL — ABNORMAL HIGH (ref 70–99)

## 2024-03-21 MED ORDER — INSULIN ASPART 100 UNIT/ML IJ SOLN
4.0000 [IU] | Freq: Once | INTRAMUSCULAR | Status: AC
  Administered 2024-03-21: 4 [IU] via SUBCUTANEOUS
  Filled 2024-03-21: qty 4

## 2024-03-21 MED ORDER — ALPRAZOLAM 0.5 MG PO TABS: 0.5000 mg | ORAL_TABLET | Freq: Two times a day (BID) | ORAL | 0 refills | Status: DC | PRN

## 2024-03-21 NOTE — Inpatient Diabetes Management (Signed)
 Inpatient Diabetes Program Recommendations  AACE/ADA: New Consensus Statement on Inpatient Glycemic Control (2015)  Target Ranges:  Prepandial:   less than 140 mg/dL      Peak postprandial:   less than 180 mg/dL (1-2 hours)      Critically ill patients:  140 - 180 mg/dL   Lab Results  Component Value Date   GLUCAP 263 (H) 03/21/2024   HGBA1C 6.7 (A) 01/10/2024    Review of Glycemic Control  Latest Reference Range & Units 03/20/24 14:33 03/20/24 16:53 03/20/24 21:47 03/21/24 00:29 03/21/24 05:57  Glucose-Capillary 70 - 99 mg/dL 841 (H) 738 (H) 897 (H) 248 (H) 263 (H)  (H): Data is abnormally high Diabetes history: DM1 Outpatient Diabetes medications: Tandem insulin  pump ( see settings below) Current orders for Inpatient glycemic control: Novolog  0-20 units tid, 0-5 units hs, Semglee  15 units QD  Inpatient Diabetes Program Recommendations:    Consider increasing Semglee  to 18 units every day and adding Novolog  2 units TID (assuming patient consuming >50% of meals).  Thanks, Tinnie Minus, MSN, RNC-OB Diabetes Coordinator [phone] (8a-5p)

## 2024-03-21 NOTE — Discharge Summary (Addendum)
 Physician Discharge Summary   Patient: Judy Cook MRN: [id number] DOB: December 19, 2000  Admit date:     03/18/2024  Discharge date: 03/21/24  Discharge Physician: Carliss LELON Canales   PCP: Elnor Lauraine BRAVO, NP   Recommendations at discharge:    Pt to be discharged home.   If you experience worsening fever, chills, chest pain, shortness of breath, or other concerning symptoms, please call your PCP or go to the emergency department immediately.  Discharge Diagnoses: Principal Problem:   Intractable nausea and vomiting Active Problems:   Type 1 diabetes mellitus with hyperglycemia (HCC)   GAD (generalized anxiety disorder)  Resolved Problems:   * No resolved hospital problems. *   Hospital Course:  23 y.o. female with medical history significant for type 1 diabetes mellitus, diabetic gastroparesis, polysubstance abuse, anxiety, and PCOS, presenting with nausea and vomiting.   Early DKA.   Assessment and Plan:   Intractable nausea vomiting - Likely multifactorial etiology with gastroparesis, possible early DKA, marijuana hyperemesis.  Appears to be showing improvement.  Aggressive IV fluid hydration on board.  Antiemetics.  Appears resolved.  Patient tolerating diet.   Likely early diabetic ketoacidosis - On presentation showed controlled glucose.  However had worsening acidemia shortly after.  Was initiated on IV insulin /DKA protocol.  Showed marked improvement in acidemia.  Initial plan to transition back to patient's insulin  pump.  However patient states that she does not have adapter.  Will transition to long-acting insulin  glargine and insulin  sliding scale.  Upon discharge we will give patient a dose of long-acting insulin  glargine prior to leaving.  Patient stated that she will transition back to her insulin  pump.   Type 1 diabetes mellitus - Acidemia resolved.  Patient states she does not have adapter for insulin  pump.  Will transition patient to insulin  glargine 15 units  daily along with high-dose insulin  sliding scale.  Initiate carb consistent diet.  Patient planning to transition back to her insulin  pump after discharge.   Hypomagnesemia - Resolved after placement.  Anxiety - Give short course of Xanax .  Recommend patient continue to follow with PCP for ongoing management of anxiety.   Consultants: None Procedures performed: None Disposition: Home Diet recommendation:  Discharge Diet Orders (From admission, onward)     Start     Ordered   03/21/24 0000  Diet - low sodium heart healthy        03/21/24 0921           Carb modified diet  DISCHARGE MEDICATION: Allergies as of 03/21/2024   No Known Allergies      Medication List     STOP taking these medications    ondansetron  4 MG disintegrating tablet Commonly known as: ZOFRAN -ODT       TAKE these medications    ALPRAZolam  0.5 MG tablet Commonly known as: XANAX  Take 1 tablet (0.5 mg total) by mouth 2 (two) times daily as needed for anxiety.   cetirizine  10 MG tablet Commonly known as: ZyrTEC  Allergy Take 1 tablet (10 mg total) by mouth daily. What changed:  when to take this reasons to take this   Dexcom G7 Sensor Misc CHANGE EVERY 10 DAYS   ibuprofen  800 MG tablet Commonly known as: ADVIL  TAKE 1 TABLET BY MOUTH EVERY 8 HOURS AS NEEDED   Junel  FE 1/20 1-20 MG-MCG tablet Generic drug: norethindrone -ethinyl estradiol -FE Take 1 tablet by mouth daily.   metoCLOPramide  5 MG tablet Commonly known as: Reglan  Take 1 tablet (5 mg total) by mouth 3 (  three) times daily.   multivitamin with minerals tablet Take 2 tablets by mouth daily.   naproxen sodium 220 MG tablet Commonly known as: ALEVE Take 440 mg by mouth daily as needed (pain).   NovoLOG  100 UNIT/ML injection Generic drug: insulin  aspart MAX DAILY 60 UNITS PER PUMP   sertraline  25 MG tablet Commonly known as: ZOLOFT  Take 1 tablet (25 mg total) by mouth daily.   Vevye 0.1 % Soln Generic drug:  cycloSPORINE Place 1 drop into both eyes in the morning and at bedtime.         Discharge Exam: Filed Weights   03/19/24 0804 03/19/24 1500  Weight: 67.7 kg 65.7 kg    GENERAL:  Alert, pleasant, no acute distress  HEENT:  EOMI CARDIOVASCULAR:  RRR, no murmurs appreciated RESPIRATORY:  Clear to auscultation, no wheezing, rales, or rhonchi GASTROINTESTINAL:  Soft, nontender, nondistended EXTREMITIES:  No LE edema bilaterally NEURO:  No new focal deficits appreciated SKIN:  No rashes noted PSYCH:  Appropriate mood and affect     Condition at discharge: improving  The results of significant diagnostics from this hospitalization (including imaging, microbiology, ancillary and laboratory) are listed below for reference.   Imaging Studies: CT ABDOMEN PELVIS W CONTRAST Result Date: 03/18/2024 EXAM: CT ABDOMEN AND PELVIS WITH CONTRAST 03/18/2024 03:46:28 PM TECHNIQUE: CT of the abdomen and pelvis was performed with the administration of 100 mL of iohexol  (OMNIPAQUE ) 300 MG/ML solution. Multiplanar reformatted images are provided for review. Automated exposure control, iterative reconstruction, and/or weight-based adjustment of the mA/kV was utilized to reduce the radiation dose to as low as reasonably achievable. COMPARISON: 01/17/2023 CLINICAL HISTORY: Nauseate vomiting after smoking marijuana. FINDINGS: LOWER CHEST: Normal heart size. Clear lung bases. LIVER: Normal, without mass or intrahepatic biliary duct dilatation. GALLBLADDER AND BILE DUCTS: Gallbladder is unremarkable. No biliary ductal dilatation. SPLEEN: Normal in size and morphology. PANCREAS: Normal, without duct dilatation or acute inflammation. ADRENAL GLANDS: No acute abnormality. KIDNEYS, URETERS AND BLADDER: No renal mass or hydronephrosis. Normal urinary bladder. GI AND BOWEL: Normal small bowel caliber. Tiny hiatal hernia. The transverse and ascending colon appear thick walled, but are underdistended. Example image 39 / 2.  Normal terminal ileum and appendix. There is no bowel obstruction. PERITONEUM AND RETROPERITONEUM: No ascites. No free air. VASCULATURE: Aorta is normal in caliber. LYMPH NODES: No lymphadenopathy. REPRODUCTIVE ORGANS: Normal uterus, without adnexal mass. BONES AND SOFT TISSUES: No acute osseous abnormality. No focal soft tissue abnormality. IMPRESSION: 1. No acute explanation for nausea and vomiting after smoking marijuana. 2. Apparent colonic wall thickening may be due to underdistention. Correlate with symptoms of infectious colitis, which could look similar. 3. Tiny hiatal hernia. Electronically signed by: Rockey Kilts MD 03/18/2024 04:41 PM EST RP Workstation: [id number]    Microbiology: Results for orders placed or performed during the hospital encounter of 03/18/24  MRSA Next Gen by PCR, Nasal     Status: None   Collection Time: 03/19/24  1:38 PM   Specimen: Nasal Mucosa; Nasal Swab  Result Value Ref Range Status   MRSA by PCR Next Gen NOT DETECTED NOT DETECTED Final    Comment: (NOTE) The GeneXpert MRSA Assay (FDA approved for NASAL specimens only), is one component of a comprehensive MRSA colonization surveillance program. It is not intended to diagnose MRSA infection nor to guide or monitor treatment for MRSA infections. Test performance is not FDA approved in patients less than 93 years old. Performed at [facility], [address], [place], [place] [address]  Labs: CBC: Recent Labs  Lab 03/18/24 1310 03/18/24 1449 03/19/24 0446 03/20/24 0531 03/21/24 0305  WBC 7.7  --  13.9* 13.0* 11.0*  NEUTROABS 3.9  --   --   --   --   HGB 14.4 11.9* 13.5 12.9 13.9  HCT 41.7 35.0* 39.5 38.2 40.4  MCV 93.1  --  93.8 93.6 92.7  PLT 456*  --  375 352 417*   Basic Metabolic Panel: Recent Labs  Lab 03/19/24 0446 03/19/24 1413 03/19/24 2053 03/20/24 0101 03/20/24 0531 03/20/24 0951 03/21/24 0305  NA 135   < > 136 136 138 134* 136  K 4.6   < > 3.5 3.7 3.2*  3.8 3.4*  CL 100   < > 103 104 106 103 103  CO2 14*   < > 19* 20* 21* 22 25  GLUCOSE 369*   < > 120* 124* 116* 160* 153*  BUN 13   < > 10 7 6  <5* <5*  CREATININE 0.85   < > 0.82 0.77 0.73 0.66 0.73  CALCIUM  9.0   < > 8.8* 8.7* 8.5* 8.9 9.2  MG 1.6*  --   --   --  2.1  --  2.2   < > = values in this interval not displayed.   Liver Function Tests: Recent Labs  Lab 03/18/24 1437  AST 22  ALT 12  ALKPHOS 76  BILITOT 0.3  PROT 7.7  ALBUMIN 4.4   CBG: Recent Labs  Lab 03/20/24 1653 03/20/24 2147 03/21/24 0029 03/21/24 0557 03/21/24 0734  GLUCAP 261* 102* 248* 263* 294*    Discharge time spent: 25 minutes.  Length of inpatient stay: 2 days  Signed: Carliss LELON Canales, DO [facility] 03/21/2024

## 2024-03-21 NOTE — Plan of Care (Signed)

## 2024-03-21 NOTE — TOC Initial Note (Signed)
 Transition of Care [facility]) - Initial/Assessment Note    Patient Details  Name: Judy Cook MRN: [id number] Date of Birth: 24-Jul-2000  [facility]) CM/SW Contact:    Alfonse JONELLE Rex, RN Phone Number: 03/21/2024, 9:22 AM  Clinical Narrative:    Patient admitted from home with c/o N/V, resides in an apartment,  has a PCP and insurance on file, family contact on file. INPT CM will follow for dc needs                Expected Discharge Plan: Home/Self Care Barriers to Discharge: Continued Medical Work up   Patient Goals and CMS Choice Patient states their goals for this hospitalization and ongoing recovery are:: return home          Expected Discharge Plan and Services       Living arrangements for the past 2 months: Apartment Expected Discharge Date: 03/21/24                                    Prior Living Arrangements/Services Living arrangements for the past 2 months: Apartment   Patient language and need for interpreter reviewed:: Yes        Need for Family Participation in Patient Care: Yes (Comment) Care giver support system in place?: Yes (comment)   Criminal Activity/Legal Involvement Pertinent to Current Situation/Hospitalization: No - Comment as needed  Activities of Daily Living   ADL Screening (condition at time of admission) Independently performs ADLs?: Yes (appropriate for developmental age) Is the patient deaf or have difficulty hearing?: No Does the patient have difficulty seeing, even when wearing glasses/contacts?: No Does the patient have difficulty concentrating, remembering, or making decisions?: No  Permission Sought/Granted                  Emotional Assessment       Orientation: : Oriented to Self, Oriented to Place, Oriented to Situation Alcohol / Substance Use: Illicit Drugs, Alcohol Use Psych Involvement: No (comment)  Admission diagnosis:  Hyperemesis [R11.10] Intractable nausea and vomiting  [R11.2] Abdominal pain, unspecified abdominal location [R10.9] Patient Active Problem List   Diagnosis Date Noted   GAD (generalized anxiety disorder) 01/04/2024   Polycystic ovary syndrome 08/15/2023   Generalized abdominal pain 07/23/2023   Proteinuria 07/23/2023   Screening examination for STD (sexually transmitted disease) 05/04/2023   Polysubstance use disorder 02/01/2023   Intractable nausea and vomiting 12/28/2022   Gastroparesis 12/28/2022   GERD with esophagitis 12/28/2022   AKI (acute kidney injury) 12/28/2022   Thrombocytosis 12/28/2022   Insulin  pump titration 11/20/2022   Stress diabetes 11/20/2022   Type 1 diabetes mellitus with hyperglycemia (HCC) 11/20/2022   Leukocytosis 10/14/2022   Colitis 09/21/2022   H/O diabetic ketoacidosis 09/04/2022   RhD negative 07/05/2022   Constipation 07/07/2021   Marijuana use 02/20/2019   Cyclical vomiting 02/20/2019   PCP:  Elnor Lauraine BRAVO, NP Pharmacy:   [facility] [phone] [address], [place] - [address] AT [facility] [address] [place] [address] Phone: [phone] Fax: [phone]     Social Drivers of Health (SDOH) Social History: SDOH Screenings   Food Insecurity: No Food Insecurity (03/18/2024)  Recent Concern: Food Insecurity - Food Insecurity Present (02/14/2024)  Housing: High Risk (03/18/2024)  Transportation Needs: No Transportation Needs (03/18/2024)  Utilities: Not At Risk (03/18/2024)  Alcohol Screen: Medium Risk (02/14/2024)  Depression (PHQ2-9): Low Risk  (  01/04/2024)  Financial Resource Strain: Medium Risk (02/14/2024)  Physical Activity: Insufficiently Active (02/14/2024)  Social Connections: Moderately Isolated (02/14/2024)  Stress: Stress Concern Present (02/14/2024)  Tobacco Use: Medium Risk (03/18/2024)   SDOH Interventions:     Readmission Risk Interventions    03/21/2024    9:18 AM  Readmission Risk Prevention Plan  Post Dischage Appt Complete  Medication  Screening Complete  Transportation Screening Complete

## 2024-03-22 ENCOUNTER — Emergency Department (HOSPITAL_COMMUNITY)

## 2024-03-22 ENCOUNTER — Encounter (HOSPITAL_COMMUNITY): Payer: Self-pay

## 2024-03-22 ENCOUNTER — Observation Stay (HOSPITAL_COMMUNITY)
Admission: EM | Admit: 2024-03-22 | Discharge: 2024-03-23 | Disposition: A | Discharge status: Home / Self Care | Attending: Emergency Medicine | Admitting: Emergency Medicine

## 2024-03-22 DIAGNOSIS — E876 Hypokalemia: Secondary | ICD-10-CM | POA: Diagnosis present

## 2024-03-22 DIAGNOSIS — R1116 Cannabis hyperemesis syndrome: Secondary | ICD-10-CM | POA: Diagnosis present

## 2024-03-22 DIAGNOSIS — E874 Mixed disorder of acid-base balance: Secondary | ICD-10-CM | POA: Diagnosis not present

## 2024-03-22 DIAGNOSIS — D750 Familial erythrocytosis: Secondary | ICD-10-CM | POA: Diagnosis not present

## 2024-03-22 DIAGNOSIS — F1090 Alcohol use, unspecified, uncomplicated: Secondary | ICD-10-CM | POA: Insufficient documentation

## 2024-03-22 DIAGNOSIS — R5383 Other fatigue: Secondary | ICD-10-CM | POA: Diagnosis not present

## 2024-03-22 DIAGNOSIS — F1210 Cannabis abuse, uncomplicated: Secondary | ICD-10-CM | POA: Diagnosis present

## 2024-03-22 DIAGNOSIS — E1110 Type 2 diabetes mellitus with ketoacidosis without coma: Secondary | ICD-10-CM | POA: Diagnosis not present

## 2024-03-22 DIAGNOSIS — Z87891 Personal history of nicotine dependence: Secondary | ICD-10-CM | POA: Diagnosis not present

## 2024-03-22 DIAGNOSIS — R0902 Hypoxemia: Secondary | ICD-10-CM | POA: Diagnosis not present

## 2024-03-22 DIAGNOSIS — D75839 Thrombocytosis, unspecified: Secondary | ICD-10-CM | POA: Diagnosis not present

## 2024-03-22 DIAGNOSIS — E1010 Type 1 diabetes mellitus with ketoacidosis without coma: Secondary | ICD-10-CM | POA: Insufficient documentation

## 2024-03-22 DIAGNOSIS — R1084 Generalized abdominal pain: Secondary | ICD-10-CM | POA: Diagnosis present

## 2024-03-22 DIAGNOSIS — R112 Nausea with vomiting, unspecified: Principal | ICD-10-CM | POA: Diagnosis present

## 2024-03-22 DIAGNOSIS — D751 Secondary polycythemia: Secondary | ICD-10-CM | POA: Diagnosis present

## 2024-03-22 DIAGNOSIS — E0810 Diabetes mellitus due to underlying condition with ketoacidosis without coma: Principal | ICD-10-CM

## 2024-03-22 DIAGNOSIS — R1030 Lower abdominal pain, unspecified: Secondary | ICD-10-CM | POA: Diagnosis not present

## 2024-03-22 DIAGNOSIS — E1143 Type 2 diabetes mellitus with diabetic autonomic (poly)neuropathy: Secondary | ICD-10-CM | POA: Diagnosis present

## 2024-03-22 LAB — BASIC METABOLIC PANEL WITH GFR
Anion gap: 17 — ABNORMAL HIGH (ref 5–15)
Anion gap: 13 (ref 5–15)
Anion gap: 9 (ref 5–15)
BUN: 5 mg/dL — ABNORMAL LOW (ref 6–20)
BUN: <5 mg/dL — ABNORMAL LOW (ref 6–20)
BUN: <5 mg/dL — ABNORMAL LOW (ref 6–20)
CO2: 21 mmol/L — ABNORMAL LOW (ref 22–32)
CO2: 21 mmol/L — ABNORMAL LOW (ref 22–32)
CO2: 24 mmol/L (ref 22–32)
Calcium: 9.6 mg/dL (ref 8.9–10.3)
Calcium: 8 mg/dL — ABNORMAL LOW (ref 8.9–10.3)
Calcium: 8.2 mg/dL — ABNORMAL LOW (ref 8.9–10.3)
Chloride: 102 mmol/L (ref 98–111)
Chloride: 103 mmol/L (ref 98–111)
Chloride: 102 mmol/L (ref 98–111)
Creatinine, Ser: 0.81 mg/dL (ref 0.44–1.00)
Creatinine, Ser: 0.66 mg/dL (ref 0.44–1.00)
Creatinine, Ser: 0.59 mg/dL (ref 0.44–1.00)
GFR, Estimated: >60 mL/min (ref >60)
GFR, Estimated: >60 mL/min (ref >60)
GFR, Estimated: >60 mL/min (ref >60)
Glucose, Bld: 145 mg/dL — ABNORMAL HIGH (ref 70–99)
Glucose, Bld: 183 mg/dL — ABNORMAL HIGH (ref 70–99)
Glucose, Bld: 175 mg/dL — ABNORMAL HIGH (ref 70–99)
Potassium: 2.8 mmol/L — ABNORMAL LOW (ref 3.5–5.1)
Potassium: 3.6 mmol/L (ref 3.5–5.1)
Potassium: 3.6 mmol/L (ref 3.5–5.1)
Sodium: 140 mmol/L (ref 135–145)
Sodium: 137 mmol/L (ref 135–145)
Sodium: 136 mmol/L (ref 135–145)

## 2024-03-22 LAB — CBC
HCT: 43 % (ref 36.0–46.0)
Hemoglobin: 15.5 g/dL — ABNORMAL HIGH (ref 12.0–15.0)
MCH: 31.9 pg (ref 26.0–34.0)
MCHC: 36 g/dL (ref 30.0–36.0)
MCV: 88.5 fL (ref 80.0–100.0)
Platelets: 485 K/uL — ABNORMAL HIGH (ref 150–400)
RBC: 4.86 MIL/uL (ref 3.87–5.11)
RDW: 12.4 % (ref 11.5–15.5)
WBC: 6.3 K/uL (ref 4.0–10.5)
nRBC: 0 % (ref 0.0–0.2)

## 2024-03-22 LAB — COMPREHENSIVE METABOLIC PANEL WITH GFR
ALT: 21 U/L (ref 0–44)
AST: 27 U/L (ref 15–41)
Albumin: 4.7 g/dL (ref 3.5–5.0)
Alkaline Phosphatase: 84 U/L (ref 38–126)
Anion gap: 19 — ABNORMAL HIGH (ref 5–15)
BUN: 6 mg/dL (ref 6–20)
CO2: 20 mmol/L — ABNORMAL LOW (ref 22–32)
Calcium: 9.9 mg/dL (ref 8.9–10.3)
Chloride: 100 mmol/L (ref 98–111)
Creatinine, Ser: 0.9 mg/dL (ref 0.44–1.00)
GFR, Estimated: >60 mL/min (ref >60)
Glucose, Bld: 134 mg/dL — ABNORMAL HIGH (ref 70–99)
Potassium: 2.7 mmol/L — CL (ref 3.5–5.1)
Sodium: 139 mmol/L (ref 135–145)
Total Bilirubin: 0.4 mg/dL (ref 0.0–1.2)
Total Protein: 8 g/dL (ref 6.5–8.1)

## 2024-03-22 LAB — HCG, SERUM, QUALITATIVE: Preg, Serum: NEGATIVE

## 2024-03-22 LAB — BLOOD GAS, VENOUS
Acid-Base Excess: 2.3 mmol/L — ABNORMAL HIGH (ref 0.0–2.0)
Bicarbonate: 23.6 mmol/L (ref 20.0–28.0)
O2 Saturation: 64.2 %
Patient temperature: 37
pCO2, Ven: 27 mmHg — ABNORMAL LOW (ref 44–60)
pH, Ven: 7.55 — ABNORMAL HIGH (ref 7.25–7.43)
pO2, Ven: 31 mmHg — CL (ref 32–45)

## 2024-03-22 LAB — I-STAT CHEM 8, ED
BUN: 4 mg/dL — ABNORMAL LOW (ref 6–20)
Calcium, Ion: 1.06 mmol/L — ABNORMAL LOW (ref 1.15–1.40)
Chloride: 103 mmol/L (ref 98–111)
Creatinine, Ser: 0.8 mg/dL (ref 0.44–1.00)
Glucose, Bld: 162 mg/dL — ABNORMAL HIGH (ref 70–99)
HCT: 44 % (ref 36.0–46.0)
Hemoglobin: 15 g/dL (ref 12.0–15.0)
Potassium: 2.8 mmol/L — ABNORMAL LOW (ref 3.5–5.1)
Sodium: 139 mmol/L (ref 135–145)
TCO2: 20 mmol/L — ABNORMAL LOW (ref 22–32)

## 2024-03-22 LAB — GLUCOSE, CAPILLARY
Glucose-Capillary: 156 mg/dL — ABNORMAL HIGH (ref 70–99)
Glucose-Capillary: 172 mg/dL — ABNORMAL HIGH (ref 70–99)
Glucose-Capillary: 185 mg/dL — ABNORMAL HIGH (ref 70–99)

## 2024-03-22 LAB — BETA-HYDROXYBUTYRIC ACID
Beta-Hydroxybutyric Acid: 0.68 mmol/L — ABNORMAL HIGH (ref 0.05–0.27)
Beta-Hydroxybutyric Acid: 0.92 mmol/L — ABNORMAL HIGH (ref 0.05–0.27)
Beta-Hydroxybutyric Acid: 0.18 mmol/L (ref 0.05–0.27)

## 2024-03-22 LAB — MAGNESIUM: Magnesium: 1.7 mg/dL (ref 1.7–2.4)

## 2024-03-22 LAB — CBG MONITORING, ED: Glucose-Capillary: 149 mg/dL — ABNORMAL HIGH (ref 70–99)

## 2024-03-22 LAB — LIPASE, BLOOD: Lipase: 26 U/L (ref 11–51)

## 2024-03-22 LAB — PHOSPHORUS: Phosphorus: <1 mg/dL — CL (ref 2.5–4.6)

## 2024-03-22 MED ORDER — ORAL CARE MOUTH RINSE: 15.0000 mL | OROMUCOSAL | Status: DC | PRN

## 2024-03-22 MED ORDER — ACETAMINOPHEN 325 MG PO TABS: 650.0000 mg | ORAL_TABLET | Freq: Four times a day (QID) | ORAL | Status: DC | PRN

## 2024-03-22 MED ORDER — ACETAMINOPHEN 650 MG RE SUPP: 650.0000 mg | Freq: Four times a day (QID) | RECTAL | Status: DC | PRN

## 2024-03-22 MED ORDER — ONDANSETRON HCL 4 MG PO TABS: 4.0000 mg | ORAL_TABLET | Freq: Four times a day (QID) | ORAL | Status: DC | PRN

## 2024-03-22 MED ORDER — DEXTROSE IN LACTATED RINGERS 5 % IV SOLN: INTRAVENOUS | Status: DC

## 2024-03-22 MED ORDER — MAGNESIUM SULFATE 2 GM/50ML IV SOLN
2.0000 g | Freq: Once | INTRAVENOUS | Status: AC
  Administered 2024-03-22: 2 g via INTRAVENOUS
  Filled 2024-03-22: qty 50

## 2024-03-22 MED ORDER — LACTATED RINGERS IV SOLN: INTRAVENOUS | Status: DC

## 2024-03-22 MED ORDER — LACTATED RINGERS IV BOLUS: 20.0000 mL/kg | Freq: Once | INTRAVENOUS | Status: DC

## 2024-03-22 MED ORDER — INSULIN REGULAR(HUMAN) IN NACL 100-0.9 UT/100ML-% IV SOLN
INTRAVENOUS | Status: DC
  Administered 2024-03-22: 4 [IU]/h via INTRAVENOUS
  Filled 2024-03-22 (×2): qty 100

## 2024-03-22 MED ORDER — HYDROMORPHONE HCL 1 MG/ML IJ SOLN
1.0000 mg | INTRAMUSCULAR | Status: AC | PRN
  Administered 2024-03-22 – 2024-03-23 (×3): 1 mg via INTRAVENOUS
  Filled 2024-03-22 (×3): qty 1

## 2024-03-22 MED ORDER — ONDANSETRON HCL 4 MG/2ML IJ SOLN
4.0000 mg | Freq: Once | INTRAMUSCULAR | Status: AC
  Administered 2024-03-22: 4 mg via INTRAVENOUS
  Filled 2024-03-22: qty 2

## 2024-03-22 MED ORDER — ENOXAPARIN SODIUM 80 MG/0.8ML IJ SOSY
75.0000 mg | PREFILLED_SYRINGE | Freq: Every day | INTRAMUSCULAR | Status: DC
  Administered 2024-03-22: 75 mg via SUBCUTANEOUS
  Filled 2024-03-22: qty 0.8

## 2024-03-22 MED ORDER — POTASSIUM PHOSPHATES 15 MMOLE/5ML IV SOLN
30.0000 mmol | Freq: Once | INTRAVENOUS | Status: AC
  Administered 2024-03-22: 30 mmol via INTRAVENOUS
  Filled 2024-03-22: qty 10

## 2024-03-22 MED ORDER — CHLORHEXIDINE GLUCONATE CLOTH 2 % EX PADS
6.0000 | MEDICATED_PAD | Freq: Every day | CUTANEOUS | Status: DC
  Administered 2024-03-22: 6 via TOPICAL

## 2024-03-22 MED ORDER — SODIUM CHLORIDE 0.9 % IV BOLUS
1000.0000 mL | Freq: Once | INTRAVENOUS | Status: AC
  Administered 2024-03-22: 1000 mL via INTRAVENOUS

## 2024-03-22 MED ORDER — PANTOPRAZOLE SODIUM 40 MG IV SOLR
40.0000 mg | Freq: Once | INTRAVENOUS | Status: AC
  Administered 2024-03-22: 40 mg via INTRAVENOUS
  Filled 2024-03-22: qty 10

## 2024-03-22 MED ORDER — PROCHLORPERAZINE EDISYLATE 10 MG/2ML IJ SOLN
10.0000 mg | Freq: Four times a day (QID) | INTRAMUSCULAR | Status: DC | PRN
  Administered 2024-03-22: 10 mg via INTRAVENOUS
  Filled 2024-03-22: qty 2

## 2024-03-22 MED ORDER — METOCLOPRAMIDE HCL 5 MG/ML IJ SOLN
10.0000 mg | Freq: Four times a day (QID) | INTRAMUSCULAR | Status: DC
  Administered 2024-03-22 – 2024-03-23 (×2): 10 mg via INTRAVENOUS
  Filled 2024-03-22 (×4): qty 2

## 2024-03-22 MED ORDER — POTASSIUM CHLORIDE 10 MEQ/100ML IV SOLN
10.0000 meq | INTRAVENOUS | Status: AC
  Administered 2024-03-22 (×4): 10 meq via INTRAVENOUS
  Filled 2024-03-22 (×5): qty 100

## 2024-03-22 MED ORDER — MORPHINE SULFATE (PF) 2 MG/ML IV SOLN
2.0000 mg | Freq: Once | INTRAVENOUS | Status: AC
  Administered 2024-03-22: 2 mg via INTRAVENOUS
  Filled 2024-03-22: qty 1

## 2024-03-22 MED ORDER — POTASSIUM CHLORIDE IN NACL 20-0.9 MEQ/L-% IV SOLN
INTRAVENOUS | Status: AC
  Filled 2024-03-22 (×3): qty 1000

## 2024-03-22 MED ORDER — DEXTROSE 50 % IV SOLN
0.0000 mL | INTRAVENOUS | Status: DC | PRN
  Administered 2024-03-23: 50 mL via INTRAVENOUS
  Filled 2024-03-22: qty 50

## 2024-03-22 MED ORDER — ONDANSETRON HCL 4 MG/2ML IJ SOLN
4.0000 mg | Freq: Four times a day (QID) | INTRAMUSCULAR | Status: DC | PRN
  Administered 2024-03-22 – 2024-03-23 (×3): 4 mg via INTRAVENOUS
  Filled 2024-03-22 (×3): qty 2

## 2024-03-22 NOTE — H&P (Signed)
 History and Physical    Patient: Judy Cook FMW:[id number] DOB: 24-Mar-2001 DOA: 03/22/2024 DOS: the patient was seen and examined on 03/22/2024 PCP: Elnor Lauraine BRAVO, NP  Patient coming from: Home  Chief Complaint: No chief complaint on file.  HPI: Judy Cook is a 23 y.o. female with medical history significant of allergic rhinitis, generalized anxiety disorder, COVID-19, dysmenorrhea,  headaches, HSV-2, seizures, thrombocytosis, PCOS, polysubstance abuse, type 1 diabetes, GERD, gastroparesis, cyclical vomiting, cannabinoid hyperemesis who was just discharged yesterday after a 3-day admission due to intractable nausea and vomiting who today has presented to the emergency department with persistence of nausea and emesis.  She also had 2 episodes of diarrhea.   No  constipation, melena or hematochezia.  No flank pain, dysuria, frequency or hematuria.  She denied fever, chills, rhinorrhea, sore throat, wheezing or hemoptysis.  No chest pain, palpitations, diaphoresis, PND, orthopnea or pitting edema of the lower extremities.  No polyuria, polydipsia, polyphagia or blurred vision.   Lab work: CBC showed white count 6.3, hemoglobin 15.5 g/dL and platelets 514.  Serum pregnancy test is negative.  Lipase was normal.  Venous blood gas showed a pH of 7.55, pCO2 of 27 and pO2 of 31 mmHg.  Bicarbonate was 23.6 and acid base excess was 2.3 mmol/L.  CMP showed a potassium of 2.7 with a CO2 of 20 mmol/L with an anion gap of 19 and a glucose of 134 mg/dL, the rest of the CMP measurements were normal.  ED course: Initial vital signs were temperature 98.1 F, pulse 84, respirations 17, BP 165/105 mmHg and O2 sat 100% on room air.  The patient received 1000 mL normal saline bolus, morphine  2 mg IVP, ondansetron  4 mg IVP, pantoprazole  40 mg IVP and was started on an insulin  infusion.   Review of Systems: As mentioned in the history of present illness. All other systems reviewed and are  negative. Past Medical History:  Diagnosis Date   Allergic rhinitis 07/07/2021   COVID 2020   mild   Diabetes mellitus (HCC)    Type 1, type 5   Dysmenorrhea 02/20/2019   GERD (gastroesophageal reflux disease)    Headache    History of herpes genitalis 06/24/2021   Intractable vomiting    SIRS (systemic inflammatory response syndrome) (HCC) 12/03/2019   Past Surgical History:  Procedure Laterality Date   DILATION AND EVACUATION N/A 07/05/2022   Procedure: DILATATION AND EVACUATION;  Surgeon: Izell Harari, MD;  Location: [facility] OR;  Service: Gynecology;  Laterality: N/A;   OPERATIVE ULTRASOUND N/A 07/05/2022   Procedure: OPERATIVE ULTRASOUND;  Surgeon: Izell Harari, MD;  Location: [facility] OR;  Service: Gynecology;  Laterality: N/A;   Social History:  reports that she has quit smoking. Her smoking use included cigarettes. She has never used smokeless tobacco. She reports current alcohol use. She reports current drug use. Drug: Marijuana.  No Known Allergies  Family History  Problem Relation Age of Onset   Diabetes Paternal Grandfather    Heart attack Paternal Grandfather    Diabetes Paternal Grandmother    Cancer Maternal Grandmother        lung   Alcohol abuse Maternal Grandfather    Alcohol abuse Father    Drug abuse Father    High Cholesterol Father    Alcohol abuse Mother    Depression Mother    Drug abuse Mother     Prior to Admission medications   Medication Sig Start Date End Date Taking? Authorizing Provider  ALPRAZolam  (XANAX ) 0.5 MG tablet Take  1 tablet (0.5 mg total) by mouth 2 (two) times daily as needed for anxiety. 03/21/24   Arlon Carliss ORN, DO  cetirizine  (ZYRTEC  ALLERGY) 10 MG tablet Take 1 tablet (10 mg total) by mouth daily. Patient taking differently: Take 10 mg by mouth daily as needed. 07/25/23   Christopher Savannah, PA-C  Continuous Glucose Sensor (DEXCOM G7 SENSOR) MISC CHANGE EVERY 10 DAYS 11/05/23   Shamleffer, Ibtehal Jaralla, MD  ibuprofen  (ADVIL ) 800 MG  tablet TAKE 1 TABLET BY MOUTH EVERY 8 HOURS AS NEEDED 03/06/24   Cleotilde Ronal RAMAN, MD  JUNEL  FE 1/20 1-20 MG-MCG tablet Take 1 tablet by mouth daily. 03/18/24   [provider]  metoCLOPramide  (REGLAN ) 5 MG tablet Take 1 tablet (5 mg total) by mouth 3 (three) times daily. 12/21/22 02/15/24  [place], PA-C  Multiple Vitamins-Minerals (MULTIVITAMIN WITH MINERALS) tablet Take 2 tablets by mouth daily.    [provider]  naproxen sodium (ALEVE) 220 MG tablet Take 440 mg by mouth daily as needed (pain).    [provider]  NOVOLOG  100 UNIT/ML injection MAX DAILY 60 UNITS PER PUMP 07/10/23   Shamleffer, Donell Cardinal, MD  sertraline  (ZOLOFT ) 25 MG tablet Take 1 tablet (25 mg total) by mouth daily. 01/04/24   Elnor Lauraine BRAVO, NP  VEVYE 0.1 % SOLN Place 1 drop into both eyes in the morning and at bedtime. 02/01/24   [provider]    Physical Exam: Vitals:   03/22/24 1259 03/22/24 1301  BP: (!) 165/105   Pulse: 84   Resp: 17   Temp: 98.1 F (36.7 C)   TempSrc: Oral   SpO2: 100%   Weight:  (!) 150 kg  Height:  5' (1.524 m)   Physical Exam Vitals and nursing note reviewed.  Constitutional:      General: She is awake. She is not in acute distress.    Appearance: She is ill-appearing.  HENT:     Head: Normocephalic.     Nose: No rhinorrhea.     Mouth/Throat:     Mouth: Mucous membranes are moist.  Eyes:     General: No scleral icterus.    Pupils: Pupils are equal, round, and reactive to light.  Neck:     Vascular: No JVD.  Cardiovascular:     Rate and Rhythm: Normal rate and regular rhythm.     Heart sounds: S1 normal and S2 normal.  Pulmonary:     Effort: Pulmonary effort is normal.     Breath sounds: Normal breath sounds. No wheezing, rhonchi or rales.  Abdominal:     General: Bowel sounds are normal. There is no distension.     Palpations: Abdomen is soft.     Tenderness: There is abdominal tenderness. There is no right CVA tenderness or  left CVA tenderness.  Musculoskeletal:     Cervical back: Neck supple.     Right lower leg: No edema.     Left lower leg: No edema.  Skin:    General: Skin is warm and dry.  Neurological:     General: No focal deficit present.     Mental Status: She is alert and oriented to person, place, and time.  Psychiatric:        Mood and Affect: Mood normal.        Behavior: Behavior normal. Behavior is cooperative.     Data Reviewed:  Results are pending, will review when available.  EKG 3 days ago was normal sinus rhythm.  Assessment and Plan: Principal Problem:   Intractable nausea and vomiting Associated with:   Generalized abdominal pain Superimposed on:    Diabetic gastroparesis HCC) In the setting of:   Cannabinoid hyperemesis syndrome Resulting in:   Mixed acid base balance disorder Observation/stepdown. Keep NPO. Advance to CLD as tolerated. Continue IV fluids. Continue insulin  infusion. Monitor CBG closely. BMP every 4 hours. BHA every 4 hours. Replace electrolytes as needed. Consult diabetes coordinator. Transition to SQ insulin  per Endo tool.  Active Problems:   Cannabis abuse  Talked to the patient about CHS. -Discussed there is no specific treatment. - Discussed that the best solution is cannabis use cessation.    Hypokalemia Replacing. Magnesium  was supplemented. Follow-up potassium level.    Hypophosphatemia Replacing. Follow-up level in 24 to 48 hours.   Erythrocytosis And:   Thrombocytosis Secondary to volume depletion. Continue IV fluids.     Advance Care Planning:   Code Status: Full Code   Consults:   Family Communication:   Severity of Illness: The appropriate patient status for this patient is OBSERVATION. Observation status is judged to be reasonable and necessary in order to provide the required intensity of service to ensure the patient's safety. The patient's presenting symptoms, physical exam findings, and initial  radiographic and laboratory data in the context of their medical condition is felt to place them at decreased risk for further clinical deterioration. Furthermore, it is anticipated that the patient will be medically stable for discharge from the hospital within 2 midnights of admission.   Author: Alm Dorn Castor, MD 03/22/2024 2:19 PM  For on call review [email or web address].   This document was prepared using Dragon voice recognition software and may contain some unintended transcription errors.

## 2024-03-22 NOTE — ED Provider Notes (Signed)
 [place] [facility]PART[facility] AT [facility] Provider Note   CSN: [id number] Arrival date & time: 03/22/24  1254     Patient presents with: No chief complaint on file.   Judy Cook is a 23 y.o. female.  {Add pertinent medical, surgical, social history, OB history to [id number]} Patient with a history of diabetes and hyperemesis syndrome.  Patient complains of vomiting that started yesterday and has been persistent   Emesis      Prior to Admission medications   Medication Sig Start Date End Date Taking? Authorizing Provider  ALPRAZolam  (XANAX ) 0.5 MG tablet Take 1 tablet (0.5 mg total) by mouth 2 (two) times daily as needed for anxiety. 03/21/24   Arlon Carliss ORN, DO  cetirizine  (ZYRTEC  ALLERGY) 10 MG tablet Take 1 tablet (10 mg total) by mouth daily. Patient taking differently: Take 10 mg by mouth daily as needed. 07/25/23   Christopher Savannah, PA-C  Continuous Glucose Sensor (DEXCOM G7 SENSOR) MISC CHANGE EVERY 10 DAYS 11/05/23   Shamleffer, Ibtehal Jaralla, MD  ibuprofen  (ADVIL ) 800 MG tablet TAKE 1 TABLET BY MOUTH EVERY 8 HOURS AS NEEDED 03/06/24   Cleotilde Ronal RAMAN, MD  JUNEL  FE 1/20 1-20 MG-MCG tablet Take 1 tablet by mouth daily. 03/18/24   [provider]  metoCLOPramide  (REGLAN ) 5 MG tablet Take 1 tablet (5 mg total) by mouth 3 (three) times daily. 12/21/22 02/15/24  [place], PA-C  Multiple Vitamins-Minerals (MULTIVITAMIN WITH MINERALS) tablet Take 2 tablets by mouth daily.    [provider]  naproxen sodium (ALEVE) 220 MG tablet Take 440 mg by mouth daily as needed (pain).    [provider]  NOVOLOG  100 UNIT/ML injection MAX DAILY 60 UNITS PER PUMP 07/10/23   Shamleffer, Ibtehal Jaralla, MD  sertraline  (ZOLOFT ) 25 MG tablet Take 1 tablet (25 mg total) by mouth daily. 01/04/24   Elnor Lauraine BRAVO, NP  VEVYE 0.1 % SOLN Place 1 drop into both eyes in the morning and at bedtime. 02/01/24   [provider]    Allergies:  Patient has no known allergies.    Review of Systems  Gastrointestinal:  Positive for vomiting.    Updated Vital Signs BP (!) 165/105 (BP Location: Left Arm)   Pulse 84   Temp 98.1 F (36.7 C) (Oral)   Resp 17   Ht 5' (1.524 m)   Wt (!) 150 kg   LMP 03/18/2024   SpO2 100%   BMI 64.58 kg/m   Physical Exam  (all labs ordered are listed, but only abnormal results are displayed) Labs Reviewed  COMPREHENSIVE METABOLIC PANEL WITH GFR - Abnormal; Notable for the following components:      Result Value   Potassium 2.7 (*)    CO2 20 (*)    Glucose, Bld 134 (*)    Anion gap 19 (*)    All other components within normal limits  CBC - Abnormal; Notable for the following components:   Hemoglobin 15.5 (*)    Platelets 485 (*)    All other components within normal limits  BLOOD GAS, VENOUS - Abnormal; Notable for the following components:   pH, Ven 7.55 (*)    pCO2, Ven 27 (*)    pO2, Ven 31 (*)    Acid-Base Excess 2.3 (*)    All other components within normal limits  I-STAT CHEM 8, ED - Abnormal; Notable for the following components:   Potassium 2.8 (*)    BUN 4 (*)    Glucose, Bld  162 (*)    Calcium , Ion 1.06 (*)    TCO2 20 (*)    All other components within normal limits  LIPASE, BLOOD  HCG, SERUM, QUALITATIVE  URINALYSIS, ROUTINE W REFLEX MICROSCOPIC  BETA-HYDROXYBUTYRIC ACID  URINE DRUG SCREEN  BASIC METABOLIC PANEL WITH GFR  BASIC METABOLIC PANEL WITH GFR  BASIC METABOLIC PANEL WITH GFR  BASIC METABOLIC PANEL WITH GFR  BETA-HYDROXYBUTYRIC ACID  BETA-HYDROXYBUTYRIC ACID  BETA-HYDROXYBUTYRIC ACID  CBG MONITORING, ED    EKG: None  Radiology: No results found.  {Document cardiac monitor, telemetry assessment procedure when appropriate:32947} Procedures   Medications Ordered in the ED  lactated ringers  bolus 3,000 mL (has no administration in time range)  insulin  regular, human (MYXREDLIN ) 100 units/ 100 mL infusion (has no administration in time range)   lactated ringers  infusion (has no administration in time range)  dextrose  5 % in lactated ringers  infusion (has no administration in time range)  dextrose  50 % solution 0-50 mL (has no administration in time range)  potassium chloride  10 mEq in 100 mL IVPB (has no administration in time range)  sodium chloride  0.9 % bolus 1,000 mL (1,000 mLs Intravenous New Bag/Given 03/22/24 1408)  ondansetron  (ZOFRAN ) injection 4 mg (4 mg Intravenous Given 03/22/24 1351)  pantoprazole  (PROTONIX ) injection 40 mg (40 mg Intravenous Given 03/22/24 1352)  morphine  (PF) 2 MG/ML injection 2 mg (2 mg Intravenous Given 03/22/24 1417)  .[facility] CRITICAL CARE Performed by: Fairy Sermon Total critical care time: 45 minutes Critical care time was exclusive of separately billable procedures and treating other patients. Critical care was necessary to treat or prevent imminent or life-threatening deterioration. Critical care was time spent personally by me on the following activities: development of treatment plan with patient and/or surrogate as well as nursing, discussions with consultants, evaluation of patient's response to treatment, examination of patient, obtaining history from patient or surrogate, ordering and performing treatments and interventions, ordering and review of laboratory studies, ordering and review of radiographic studies, pulse oximetry and re-evaluation of patient's condition.     {Click here for ABCD2, HEART and other calculators REFRESH Note before signing:1}                              Medical Decision Making Amount and/or Complexity of Data Reviewed Labs: ordered. Radiology: ordered.  Risk Prescription drug management. Decision regarding hospitalization.   Patient with DKA and hypokalemia.  She will be admitted to medicine  {Document critical care time when appropriate  Document review of labs and clinical decision tools ie CHADS2VASC2, etc  Document your independent review of  radiology images and any outside records  Document your discussion with family members, caretakers and with consultants  Document social determinants of health affecting pt's care  Document your decision making why or why not admission, treatments were needed:32947:::1}   Final diagnoses:  Diabetic ketoacidosis without coma associated with diabetes mellitus due to underlying condition [facility])    ED Discharge Orders     None

## 2024-03-22 NOTE — ED Triage Notes (Signed)
 Pt BIB EMS due to N&V, c/o dull lower abdominal pain that started this morning. Pt states she feels lethargic. Denies SOB.   Per EMS CBG 117

## 2024-03-23 DIAGNOSIS — R112 Nausea with vomiting, unspecified: Secondary | ICD-10-CM | POA: Diagnosis not present

## 2024-03-23 LAB — GLUCOSE, CAPILLARY
Glucose-Capillary: 101 mg/dL — ABNORMAL HIGH (ref 70–99)
Glucose-Capillary: 110 mg/dL — ABNORMAL HIGH (ref 70–99)
Glucose-Capillary: 121 mg/dL — ABNORMAL HIGH (ref 70–99)
Glucose-Capillary: 129 mg/dL — ABNORMAL HIGH (ref 70–99)
Glucose-Capillary: 130 mg/dL — ABNORMAL HIGH (ref 70–99)
Glucose-Capillary: 145 mg/dL — ABNORMAL HIGH (ref 70–99)
Glucose-Capillary: 158 mg/dL — ABNORMAL HIGH (ref 70–99)
Glucose-Capillary: 159 mg/dL — ABNORMAL HIGH (ref 70–99)
Glucose-Capillary: 181 mg/dL — ABNORMAL HIGH (ref 70–99)
Glucose-Capillary: 210 mg/dL — ABNORMAL HIGH (ref 70–99)
Glucose-Capillary: 225 mg/dL — ABNORMAL HIGH (ref 70–99)
Glucose-Capillary: 67 mg/dL — ABNORMAL LOW (ref 70–99)
Glucose-Capillary: 92 mg/dL (ref 70–99)

## 2024-03-23 LAB — CBC
HCT: 36.9 % (ref 36.0–46.0)
Hemoglobin: 12.8 g/dL (ref 12.0–15.0)
MCH: 32.1 pg (ref 26.0–34.0)
MCHC: 34.7 g/dL (ref 30.0–36.0)
MCV: 92.5 fL (ref 80.0–100.0)
Platelets: 398 K/uL (ref 150–400)
RBC: 3.99 MIL/uL (ref 3.87–5.11)
RDW: 13.1 % (ref 11.5–15.5)
WBC: 10.8 K/uL — ABNORMAL HIGH (ref 4.0–10.5)
nRBC: 0 % (ref 0.0–0.2)

## 2024-03-23 LAB — BASIC METABOLIC PANEL WITH GFR
Anion gap: 9 (ref 5–15)
BUN: <5 mg/dL — ABNORMAL LOW (ref 6–20)
CO2: 24 mmol/L (ref 22–32)
Calcium: 7.9 mg/dL — ABNORMAL LOW (ref 8.9–10.3)
Chloride: 104 mmol/L (ref 98–111)
Creatinine, Ser: 0.66 mg/dL (ref 0.44–1.00)
GFR, Estimated: >60 mL/min (ref >60)
Glucose, Bld: 139 mg/dL — ABNORMAL HIGH (ref 70–99)
Potassium: 3.4 mmol/L — ABNORMAL LOW (ref 3.5–5.1)
Sodium: 138 mmol/L (ref 135–145)

## 2024-03-23 LAB — COMPREHENSIVE METABOLIC PANEL WITH GFR
ALT: 14 U/L (ref 0–44)
AST: 16 U/L (ref 15–41)
Albumin: 3.8 g/dL (ref 3.5–5.0)
Alkaline Phosphatase: 72 U/L (ref 38–126)
Anion gap: 10 (ref 5–15)
BUN: <5 mg/dL — ABNORMAL LOW (ref 6–20)
CO2: 24 mmol/L (ref 22–32)
Calcium: 7.9 mg/dL — ABNORMAL LOW (ref 8.9–10.3)
Chloride: 105 mmol/L (ref 98–111)
Creatinine, Ser: 0.64 mg/dL (ref 0.44–1.00)
GFR, Estimated: >60 mL/min (ref >60)
Glucose, Bld: 121 mg/dL — ABNORMAL HIGH (ref 70–99)
Potassium: 3.7 mmol/L (ref 3.5–5.1)
Sodium: 138 mmol/L (ref 135–145)
Total Bilirubin: 0.4 mg/dL (ref 0.0–1.2)
Total Protein: 6.4 g/dL — ABNORMAL LOW (ref 6.5–8.1)

## 2024-03-23 LAB — BETA-HYDROXYBUTYRIC ACID
Beta-Hydroxybutyric Acid: 0.18 mmol/L (ref 0.05–0.27)
Beta-Hydroxybutyric Acid: 0.26 mmol/L (ref 0.05–0.27)

## 2024-03-23 LAB — PHOSPHORUS: Phosphorus: 2.4 mg/dL — ABNORMAL LOW (ref 2.5–4.6)

## 2024-03-23 MED ORDER — INSULIN GLARGINE-YFGN 100 UNIT/ML ~~LOC~~ SOLN
15.0000 [IU] | Freq: Every day | SUBCUTANEOUS | Status: DC
  Administered 2024-03-23: 15 [IU] via SUBCUTANEOUS
  Filled 2024-03-23: qty 0.15

## 2024-03-23 MED ORDER — K PHOS MONO-SOD PHOS DI & MONO 155-852-130 MG PO TABS
500.0000 mg | ORAL_TABLET | Freq: Three times a day (TID) | ORAL | Status: DC
  Administered 2024-03-23: 500 mg via ORAL
  Filled 2024-03-23: qty 2

## 2024-03-23 MED ORDER — ENOXAPARIN SODIUM 40 MG/0.4ML IJ SOSY: 40.0000 mg | PREFILLED_SYRINGE | Freq: Every day | INTRAMUSCULAR | Status: DC

## 2024-03-23 MED ORDER — INSULIN ASPART 100 UNIT/ML IJ SOLN: 0.0000 [IU] | Freq: Three times a day (TID) | INTRAMUSCULAR | Status: DC

## 2024-03-23 MED ORDER — POTASSIUM CHLORIDE CRYS ER 20 MEQ PO TBCR
40.0000 meq | EXTENDED_RELEASE_TABLET | Freq: Once | ORAL | Status: AC
  Administered 2024-03-23: 40 meq via ORAL
  Filled 2024-03-23: qty 2

## 2024-03-23 MED ORDER — K PHOS MONO-SOD PHOS DI & MONO 155-852-130 MG PO TABS: 500.0000 mg | ORAL_TABLET | Freq: Three times a day (TID) | ORAL | 0 refills | Status: AC

## 2024-03-23 NOTE — Discharge Summary (Signed)
 Physician Discharge Summary  Judy Cook FMW:[id number] DOB: 2000/06/28 DOA: 03/22/2024  PCP: Elnor Lauraine BRAVO, NP  Admit date: 03/22/2024 Discharge date: 03/23/2024  Admitted From: Home Disposition:  Home  Discharge Condition:Stable CODE STATUS:FULL Diet recommendation: Carb Modified   Brief/Interim Summary: Patient  is a 23 y.o. female with medical history significant of allergic rhinitis, generalized anxiety disorder, COVID-19, dysmenorrhea,  headaches, HSV-2, seizures, thrombocytosis, PCOS, polysubstance abuse, type 1 diabetes, GERD, gastroparesis, cyclical vomiting, cannabinoid hyperemesis who was just discharged on 11/28 after a 3-day admission due to intractable nausea and vomiting who today has presented to the emergency department with persistence of nausea and emesis.  She also had 2 episodes of diarrhea.  On presentation she was hemodynamically stable.  Lab work showed a potassium of 2.7, anion gap of 19.  She was started on IV insulin  and IV fluids.  Potassium supplemented.  Her anion gap has normalized this morning.  Blood sugar stable.  Transition back to long-acting and sliding scale.  She says she has insulin  pump at home and she is compliant with that.  Medically stable for discharge today.  We have recommended her to quit marijuana, follow-up with PCP and endocrinologist as an outpatient.  Following problems were addressed during the hospitalization:   Intractable nausea/vomiting/abdominal pain/diabetic gastroparesis/cannabinoid hyperemesis syndrome: No abdominal pain, nausea or vomiting today.  Abdomen benign on examination.  Counseled to quit marijuana.  Diabetes type 1/diabetic keto acidosis: Positive anion gap on presentation.  Started on insulin  drip.  Anion gap is closed.  Continue home insulin  pump.  She has enough supplies.  Recent A1c in the range of 6.  She follows with endocrinology.  We recommend her to monitor her blood sugars at home, be compliant with  insulin  and follow-up with her PCP and endocrinologist  Hypokalemia/hypophosphatemia: Supplemented.   Discharge Diagnoses:  Principal Problem:   Intractable nausea and vomiting Active Problems:   Diabetic gastroparesis (HCC)   Hypokalemia   Thrombocytosis   Generalized abdominal pain   Mixed acid base balance disorder   Erythrocytosis   Cannabinoid hyperemesis syndrome   Hypophosphatemia   Cannabis abuse    Discharge Instructions  Discharge Instructions     Diet Carb Modified   Complete by: As directed    Discharge instructions   Complete by: As directed    1)Please quit marijuana 2)Monitor your blood sugar at home 3)Follow up with your PCP and endocrinologist as an outpatient   Increase activity slowly   Complete by: As directed       Allergies as of 03/23/2024   No Known Allergies      Medication List     TAKE these medications    ALPRAZolam  0.5 MG tablet Commonly known as: XANAX  Take 1 tablet (0.5 mg total) by mouth 2 (two) times daily as needed for anxiety.   cetirizine  10 MG tablet Commonly known as: ZyrTEC  Allergy Take 1 tablet (10 mg total) by mouth daily. What changed:  when to take this reasons to take this   Dexcom G7 Sensor Misc CHANGE EVERY 10 DAYS   ibuprofen  800 MG tablet Commonly known as: ADVIL  TAKE 1 TABLET BY MOUTH EVERY 8 HOURS AS NEEDED What changed: reasons to take this   Junel  FE 1/20 1-20 MG-MCG tablet Generic drug: norethindrone -ethinyl estradiol -FE Take 1 tablet by mouth daily.   metoCLOPramide  5 MG tablet Commonly known as: Reglan  Take 1 tablet (5 mg total) by mouth 3 (three) times daily.   multivitamin with minerals tablet Take 2 tablets  by mouth daily.   naproxen sodium 220 MG tablet Commonly known as: ALEVE Take 440 mg by mouth daily as needed (pain).   NovoLOG  100 UNIT/ML injection Generic drug: insulin  aspart MAX DAILY 60 UNITS PER PUMP   phosphorus 155-852-130 MG tablet Commonly known as: K PHOS   NEUTRAL Take 2 tablets (500 mg total) by mouth 3 (three) times daily for 3 days.   sertraline  25 MG tablet Commonly known as: ZOLOFT  Take 1 tablet (25 mg total) by mouth daily.   Vevye 0.1 % Soln Generic drug: cycloSPORINE Place 1 drop into both eyes in the morning and at bedtime.        Follow-up Information     Elnor Lauraine BRAVO, NP. Schedule an appointment as soon as possible for a visit in 1 week(s).   Specialty: Nurse Practitioner Contact information: [address] [place] [address] [phone]                No Known Allergies  Consultations: None   Procedures/Studies: DG Chest Portable 1 View Result Date: 03/22/2024 CLINICAL DATA:  Lower abdominal pain.  Lethargy. EXAM: PORTABLE CHEST 1 VIEW COMPARISON:  12/28/2022. FINDINGS: Normal heart, mediastinum and hila. Clear lungs.  No pleural effusion or pneumothorax. Skeletal structures are unremarkable. IMPRESSION: No active disease. Electronically Signed   By: Alm Parkins M.D.   On: 03/22/2024 16:21   CT ABDOMEN PELVIS W CONTRAST Result Date: 03/18/2024 EXAM: CT ABDOMEN AND PELVIS WITH CONTRAST 03/18/2024 03:46:28 PM TECHNIQUE: CT of the abdomen and pelvis was performed with the administration of 100 mL of iohexol  (OMNIPAQUE ) 300 MG/ML solution. Multiplanar reformatted images are provided for review. Automated exposure control, iterative reconstruction, and/or weight-based adjustment of the mA/kV was utilized to reduce the radiation dose to as low as reasonably achievable. COMPARISON: 01/17/2023 CLINICAL HISTORY: Nauseate vomiting after smoking marijuana. FINDINGS: LOWER CHEST: Normal heart size. Clear lung bases. LIVER: Normal, without mass or intrahepatic biliary duct dilatation. GALLBLADDER AND BILE DUCTS: Gallbladder is unremarkable. No biliary ductal dilatation. SPLEEN: Normal in size and morphology. PANCREAS: Normal, without duct dilatation or acute inflammation. ADRENAL GLANDS: No acute abnormality.  KIDNEYS, URETERS AND BLADDER: No renal mass or hydronephrosis. Normal urinary bladder. GI AND BOWEL: Normal small bowel caliber. Tiny hiatal hernia. The transverse and ascending colon appear thick walled, but are underdistended. Example image 39 / 2. Normal terminal ileum and appendix. There is no bowel obstruction. PERITONEUM AND RETROPERITONEUM: No ascites. No free air. VASCULATURE: Aorta is normal in caliber. LYMPH NODES: No lymphadenopathy. REPRODUCTIVE ORGANS: Normal uterus, without adnexal mass. BONES AND SOFT TISSUES: No acute osseous abnormality. No focal soft tissue abnormality. IMPRESSION: 1. No acute explanation for nausea and vomiting after smoking marijuana. 2. Apparent colonic wall thickening may be due to underdistention. Correlate with symptoms of infectious colitis, which could look similar. 3. Tiny hiatal hernia. Electronically signed by: Rockey Kilts MD 03/18/2024 04:41 PM EST RP Workstation: [id number]      Subjective: Patient seen and examined at bedside today.  Hemodynamically stable.  Comfortable.  Anion gap is closed.  No nausea vomiting or abdomen pain today.  Abdomen is benign examination.  Medically stable for discharge.  Discharge Exam: Vitals:   03/23/24 0400 03/23/24 0500  BP: 111/65 118/61  Pulse: 71 82  Resp: 15 15  Temp:    SpO2: 99% 98%   Vitals:   03/23/24 0300 03/23/24 0354 03/23/24 0400 03/23/24 0500  BP: (!) 134/91  111/65 118/61  Pulse: 76  71 82  Resp: ROLLEN)  21  15 15   Temp:  97.6 F (36.4 C)    TempSrc:      SpO2: 100%  99% 98%  Weight:      Height:        General: Pt is alert, awake, not in acute distress Cardiovascular: RRR, S1/S2 +, no rubs, no gallops Respiratory: CTA bilaterally, no wheezing, no rhonchi Abdominal: Soft, NT, ND, bowel sounds + Extremities: no edema, no cyanosis    The results of significant diagnostics from this hospitalization (including imaging, microbiology, ancillary and laboratory) are listed below for reference.      Microbiology: Recent Results (from the past 240 hours)  MRSA Next Gen by PCR, Nasal     Status: None   Collection Time: 03/19/24  1:38 PM   Specimen: Nasal Mucosa; Nasal Swab  Result Value Ref Range Status   MRSA by PCR Next Gen NOT DETECTED NOT DETECTED Final    Comment: (NOTE) The GeneXpert MRSA Assay (FDA approved for NASAL specimens only), is one component of a comprehensive MRSA colonization surveillance program. It is not intended to diagnose MRSA infection nor to guide or monitor treatment for MRSA infections. Test performance is not FDA approved in patients less than 76 years old. Performed at [facility], [address] [place], [place] [address]      Labs: BNP (last 3 results) No results for input(s): BNP in the last 8760 hours. Basic Metabolic Panel: Recent Labs  Lab 03/19/24 0446 03/19/24 1413 03/20/24 0531 03/20/24 0951 03/21/24 0305 03/22/24 1311 03/22/24 1348 03/22/24 1415 03/22/24 1835 03/22/24 2148 03/23/24 0305 03/23/24 0521  NA 135   < > 138   < > 136   < > 140 139 137 136 138 138  K 4.6   < > 3.2*   < > 3.4*   < > 2.8* 2.8* 3.6 3.6 3.7 3.4*  CL 100   < > 106   < > 103   < > 102 103 103 102 105 104  CO2 14*   < > 21*   < > 25   < > 21*  --  21* 24 24 24   GLUCOSE 369*   < > 116*   < > 153*   < > 145* 162* 183* 175* 121* 139*  BUN 13   < > 6   < > <5*   < > 5* 4* <5* <5* <5* <5*  CREATININE 0.85   < > 0.73   < > 0.73   < > 0.81 0.80 0.66 0.59 0.64 0.66  CALCIUM  9.0   < > 8.5*   < > 9.2   < > 9.6  --  8.0* 8.2* 7.9* 7.9*  MG 1.6*  --  2.1  --  2.2  --  1.7  --   --   --   --   --   PHOS  --   --   --   --   --   --  <1.0*  --   --   --   --  2.4*   < > = values in this interval not displayed.   Liver Function Tests: Recent Labs  Lab 03/18/24 1437 03/22/24 1311 03/23/24 0305  AST 22 27 16   ALT 12 21 14   ALKPHOS 76 84 72  BILITOT 0.3 0.4 0.4  PROT 7.7 8.0 6.4*  ALBUMIN 4.4 4.7 3.8   Recent Labs  Lab  03/18/24 1437 03/22/24 1311  LIPASE 14 26  No results for input(s): AMMONIA in the last 168 hours. CBC: Recent Labs  Lab 03/18/24 1310 03/18/24 1449 03/19/24 0446 03/20/24 0531 03/21/24 0305 03/22/24 1311 03/22/24 1415 03/23/24 0305  WBC 7.7  --  13.9* 13.0* 11.0* 6.3  --  10.8*  NEUTROABS 3.9  --   --   --   --   --   --   --   HGB 14.4   < > 13.5 12.9 13.9 15.5* 15.0 12.8  HCT 41.7   < > 39.5 38.2 40.4 43.0 44.0 36.9  MCV 93.1  --  93.8 93.6 92.7 88.5  --  92.5  PLT 456*  --  375 352 417* 485*  --  398   < > = values in this interval not displayed.   Cardiac Enzymes: No results for input(s): CKTOTAL, CKMB, CKMBINDEX, TROPONINI in the last 168 hours. BNP: Invalid input(s): POCBNP CBG: Recent Labs  Lab 03/23/24 0457 03/23/24 0614 03/23/24 0748 03/23/24 0854 03/23/24 0956  GLUCAP 145* 121* 101* 158* 225*   D-Dimer No results for input(s): DDIMER in the last 72 hours. Hgb A1c No results for input(s): HGBA1C in the last 72 hours. Lipid Profile No results for input(s): CHOL, HDL, LDLCALC, TRIG, CHOLHDL, LDLDIRECT in the last 72 hours. Thyroid  function studies No results for input(s): TSH, T4TOTAL, T3FREE, THYROIDAB in the last 72 hours.  Invalid input(s): FREET3 Anemia work up No results for input(s): VITAMINB12, FOLATE, FERRITIN, TIBC, IRON, RETICCTPCT in the last 72 hours. Urinalysis    Component Value Date/Time   COLORURINE YELLOW 03/18/2024 1518   APPEARANCEUR CLEAR 03/18/2024 1518   LABSPEC 1.016 03/18/2024 1518   PHURINE 7.0 03/18/2024 1518   GLUCOSEU 150 (A) 03/18/2024 1518   GLUCOSEU 250 (A) 09/21/2022 1011   HGBUR MODERATE (A) 03/18/2024 1518   BILIRUBINUR NEGATIVE 03/18/2024 1518   BILIRUBINUR Negative 08/02/2023 1633   KETONESUR 20 (A) 03/18/2024 1518   PROTEINUR 30 (A) 03/18/2024 1518   UROBILINOGEN 0.2 08/02/2023 1633   UROBILINOGEN 0.2 09/21/2022 1011   NITRITE NEGATIVE 03/18/2024 1518    LEUKOCYTESUR NEGATIVE 03/18/2024 1518   Sepsis Labs Recent Labs  Lab 03/20/24 0531 03/21/24 0305 03/22/24 1311 03/23/24 0305  WBC 13.0* 11.0* 6.3 10.8*   Microbiology Recent Results (from the past 240 hours)  MRSA Next Gen by PCR, Nasal     Status: None   Collection Time: 03/19/24  1:38 PM   Specimen: Nasal Mucosa; Nasal Swab  Result Value Ref Range Status   MRSA by PCR Next Gen NOT DETECTED NOT DETECTED Final    Comment: (NOTE) The GeneXpert MRSA Assay (FDA approved for NASAL specimens only), is one component of a comprehensive MRSA colonization surveillance program. It is not intended to diagnose MRSA infection nor to guide or monitor treatment for MRSA infections. Test performance is not FDA approved in patients less than 65 years old. Performed at [facility], [address] [place], [place] [address]     Please note: You were cared for by a hospitalist during your hospital stay. Once you are discharged, your primary care physician will handle any further medical issues. Please note that NO REFILLS for any discharge medications will be authorized once you are discharged, as it is imperative that you return to your primary care physician (or establish a relationship with a primary care physician if you do not have one) for your post hospital discharge needs so that they can reassess your need for medications and monitor your lab values.  Time coordinating discharge: 40 minutes  SIGNED:   Ivonne Mustache, MD  [facility] 03/23/2024, 10:31 AM Pager [phone]  If 7PM-7AM, please contact night-coverage [email or web address] Password [email or web address]

## 2024-03-24 ENCOUNTER — Telehealth: Payer: Self-pay | Admitting: *Deleted

## 2024-03-24 LAB — GLUCOSE, CAPILLARY
Glucose-Capillary: 165 mg/dL — ABNORMAL HIGH (ref 70–99)
Glucose-Capillary: 186 mg/dL — ABNORMAL HIGH (ref 70–99)
Glucose-Capillary: 174 mg/dL — ABNORMAL HIGH (ref 70–99)
Glucose-Capillary: 183 mg/dL — ABNORMAL HIGH (ref 70–99)

## 2024-03-24 NOTE — Transitions of Care (Post Inpatient/ED Visit) (Signed)
 03/24/2024  Name: Judy Cook MRN: [id number] DOB: Mar 15, 2001  Today's TOC FU Call Status: Today's TOC FU Call Status:: Successful TOC FU Call Completed  Patient's Name and Date of Birth confirmed. Name, DOB  Transition Care Management Follow-up Telephone Call Date of Discharge: 03/21/24 (noted additional hospital visit 11/29-30/ 2025 for ongoing nausea: OBS status) Discharge Facility: [facility]) Type of Discharge: Inpatient Admission Primary Inpatient Discharge Diagnosis:: intractable nausea/ vomiting possibly due to cannabis use in setting of history of Type I DM How have you been since you were released from the hospital?: Better (I am doing fine now; going to pick up the K-phosphate as soon as the pharmacy confirms that it is ready for pickup.  My nausea is gone and I am eating fine.  I have been managing my diabetes for a long itme and my A1-C is good.  I don't need more calls) Any questions or concerns?: No  Items Reviewed: Did you receive and understand the discharge instructions provided?: Yes (thoroughly reviewed with patient who verbalizes good understanding of same) Medications obtained,verified, and reconciled?: Yes (Medications Reviewed) (Full medication reconciliation/ review completed; no concerns or discrepancies identified; confirmed patient obtained/ is taking all newly Rx'd medications as instructed; self-manages medications and denies questions/ concerns around medications today) Any new allergies since your discharge?: No Dietary orders reviewed?: Yes Type of Diet Ordered:: Healthy, watch sugars and carbs Do you have support at home?: Yes People in Home [RPT]: alone Name of Support/Comfort Primary Source: Reports independent in self-care activities; resides alone; has people that can help if needed  Medications Reviewed Today: Medications Reviewed Today     Reviewed by Oswin Griffith M, RN (Registered Nurse) on 03/24/24 at 1131  Med List  Status: <None>   Medication Order Taking? Sig Documenting Provider Last Dose Status Informant  ALPRAZolam  (XANAX ) 0.5 MG tablet [id number] Yes Take 1 tablet (0.5 mg total) by mouth 2 (two) times daily as needed for anxiety. Arlon Carliss ORN, DO  Active Self, Pharmacy Records  cetirizine  (ZYRTEC  ALLERGY) 10 MG tablet [id number] Yes Take 1 tablet (10 mg total) by mouth daily.  Patient taking differently: Take 10 mg by mouth daily as needed for allergies. 03/24/24: Reports during TOC call, takes prn   Christopher Savannah, PA-C  Active Self, Pharmacy Records  Continuous Glucose Sensor (DEXCOM G7 SENSOR) [place] [id number] Yes CHANGE EVERY 10 DAYS Shamleffer, Donell Cardinal, MD  Active Self, Pharmacy Records  ibuprofen  (ADVIL ) 800 MG tablet [id number] Yes TAKE 1 TABLET BY MOUTH EVERY 8 HOURS AS NEEDED Cleotilde Ronal RAMAN, MD  Active Self, Pharmacy Records  JUNEL  FE 1/20 1-20 MG-MCG tablet [id number] Yes Take 1 tablet by mouth daily.  Patient taking differently: Take 1 tablet by mouth daily. 03/24/24: reports during [facility] call, often forgets to take consistently   [provider]  Active Self, Pharmacy Records  metoCLOPramide  (REGLAN ) 5 MG tablet [id number]  Take 1 tablet (5 mg total) by mouth 3 (three) times daily.  Patient not taking: Reported on 03/24/2024   [place], PA-C  Expired 03/23/24 2359 Self, Pharmacy Records  Multiple Vitamins-Minerals (MULTIVITAMIN WITH MINERALS) tablet [id number] Yes Take 2 tablets by mouth daily. [provider]  Active Self, Pharmacy Records  naproxen sodium (ALEVE) 220 MG tablet [id number] Yes Take 440 mg by mouth daily as needed (pain). [provider]  Active Self, Pharmacy Records  NOVOLOG  100 UNIT/ML injection [id number] Yes MAX DAILY 60 UNITS PER PUMP Shamleffer, Donell Cardinal, MD  Active Self, Pharmacy Records  Med Note ALLEGRA, MUHAMMAD I   Tue Mar 18, 2024 11:40 PM) Pump has been removed  phosphorus (K PHOS NEUTRAL) 155-852-130 MG tablet  [id number] Yes Take 2 tablets (500 mg total) by mouth 3 (three) times daily for 3 days. Jillian Buttery, MD  Active   sertraline  (ZOLOFT ) 25 MG tablet [id number]  Take 1 tablet (25 mg total) by mouth daily.  Patient not taking: Reported on 03/24/2024   Elnor Lauraine BRAVO, NP  Active Self, Pharmacy Records  VEVYE 0.1 % SOLN [id number] Yes Place 1 drop into both eyes in the morning and at bedtime. [provider]  Active Self, Pharmacy Records           Home Care and Equipment/Supplies: Were Home Health Services Ordered?: No Any new equipment or medical supplies ordered?: No  Functional Questionnaire: Do you need assistance with bathing/showering or dressing?: No Do you need assistance with meal preparation?: No Do you need assistance with eating?: No Do you have difficulty maintaining continence: No Do you need assistance with getting out of bed/getting out of a chair/moving?: No Do you have difficulty managing or taking your medications?: No  Follow up appointments reviewed: PCP Follow-up appointment confirmed?: Yes (care coordination outreach in real-time with scheduling care guide to successfully schedule hospital follow up PCP appointment 03/31/24) Date of PCP follow-up appointment?: 03/31/24 Follow-up Provider: PCP- covering provider Specialist Hospital Follow-up appointment confirmed?: Yes Date of Specialist follow-up appointment?: 05/12/24 (verified this is recommended time frame for follow up per hospital discharging provider notes) Follow-Up Specialty Provider:: Endocrinology provider Do you need transportation to your follow-up appointment?: No Do you understand care options if your condition(s) worsen?: Yes-patient verbalized understanding  SDOH Interventions Today    Flowsheet Row Most Recent Value  SDOH Interventions   Food Insecurity Interventions Intervention Not Indicated  [During TOC call today- patient denies food insecurity]  Housing Interventions Intervention  Not Indicated  [During TOC call today- patient denies housing concerns]  Transportation Interventions Intervention Not Indicated  [Reports drives self]  Utilities Interventions Intervention Not Indicated   See TOC assessment tabs for additional assessment/ TOC intervention information  Patient declines need for ongoing/ further care management/ coordination outreach; declines enrollment in 30-day TOC program- declines taking my direct phone number should needs/ concerns arise post-TOC call   Pls call/ message for questions,  Cameshia Cressman Mckinney Layken Beg, RN, BSN, Media Planner  Transitions of Care  [facility]  [place] [phone]: direct office

## 2024-03-31 ENCOUNTER — Encounter (HOSPITAL_COMMUNITY): Payer: Self-pay

## 2024-03-31 ENCOUNTER — Observation Stay (HOSPITAL_COMMUNITY)
Admission: EM | Admit: 2024-03-31 | Discharge: 2024-04-02 | Disposition: A | Attending: Emergency Medicine | Admitting: Emergency Medicine

## 2024-03-31 ENCOUNTER — Inpatient Hospital Stay: Admitting: Family Medicine

## 2024-03-31 DIAGNOSIS — Z87891 Personal history of nicotine dependence: Secondary | ICD-10-CM | POA: Diagnosis not present

## 2024-03-31 DIAGNOSIS — E7132 Disorders of ketone metabolism: Secondary | ICD-10-CM | POA: Diagnosis not present

## 2024-03-31 DIAGNOSIS — E860 Dehydration: Secondary | ICD-10-CM | POA: Diagnosis not present

## 2024-03-31 DIAGNOSIS — F411 Generalized anxiety disorder: Secondary | ICD-10-CM | POA: Diagnosis not present

## 2024-03-31 DIAGNOSIS — K219 Gastro-esophageal reflux disease without esophagitis: Secondary | ICD-10-CM | POA: Diagnosis not present

## 2024-03-31 DIAGNOSIS — R739 Hyperglycemia, unspecified: Secondary | ICD-10-CM | POA: Diagnosis not present

## 2024-03-31 DIAGNOSIS — R1084 Generalized abdominal pain: Secondary | ICD-10-CM | POA: Diagnosis not present

## 2024-03-31 DIAGNOSIS — F1290 Cannabis use, unspecified, uncomplicated: Secondary | ICD-10-CM | POA: Diagnosis not present

## 2024-03-31 DIAGNOSIS — R112 Nausea with vomiting, unspecified: Secondary | ICD-10-CM | POA: Diagnosis not present

## 2024-03-31 DIAGNOSIS — I10 Essential (primary) hypertension: Secondary | ICD-10-CM | POA: Diagnosis not present

## 2024-03-31 DIAGNOSIS — R9431 Abnormal electrocardiogram [ECG] [EKG]: Secondary | ICD-10-CM | POA: Diagnosis not present

## 2024-03-31 DIAGNOSIS — D72829 Elevated white blood cell count, unspecified: Secondary | ICD-10-CM | POA: Diagnosis not present

## 2024-03-31 DIAGNOSIS — Z8639 Personal history of other endocrine, nutritional and metabolic disease: Secondary | ICD-10-CM

## 2024-03-31 DIAGNOSIS — R7889 Finding of other specified substances, not normally found in blood: Secondary | ICD-10-CM

## 2024-03-31 DIAGNOSIS — E1065 Type 1 diabetes mellitus with hyperglycemia: Secondary | ICD-10-CM

## 2024-03-31 DIAGNOSIS — E11649 Type 2 diabetes mellitus with hypoglycemia without coma: Secondary | ICD-10-CM | POA: Diagnosis not present

## 2024-03-31 DIAGNOSIS — Z8616 Personal history of COVID-19: Secondary | ICD-10-CM | POA: Diagnosis not present

## 2024-03-31 DIAGNOSIS — Z794 Long term (current) use of insulin: Secondary | ICD-10-CM | POA: Diagnosis not present

## 2024-03-31 HISTORY — DX: Cannabis hyperemesis syndrome: R11.16

## 2024-03-31 LAB — CBC
HCT: 39.5 % (ref 36.0–46.0)
Hemoglobin: 13.5 g/dL (ref 12.0–15.0)
MCH: 32.2 pg (ref 26.0–34.0)
MCHC: 34.2 g/dL (ref 30.0–36.0)
MCV: 94.3 fL (ref 80.0–100.0)
Platelets: 486 K/uL — ABNORMAL HIGH (ref 150–400)
RBC: 4.19 MIL/uL (ref 3.87–5.11)
RDW: 13.9 % (ref 11.5–15.5)
WBC: 15.3 K/uL — ABNORMAL HIGH (ref 4.0–10.5)
nRBC: 0 % (ref 0.0–0.2)

## 2024-03-31 LAB — COMPREHENSIVE METABOLIC PANEL WITH GFR
ALT: 18 U/L (ref 0–44)
AST: 28 U/L (ref 15–41)
Albumin: 4.7 g/dL (ref 3.5–5.0)
Alkaline Phosphatase: 76 U/L (ref 38–126)
Anion gap: 20 — ABNORMAL HIGH (ref 5–15)
BUN: 9 mg/dL (ref 6–20)
CO2: 16 mmol/L — ABNORMAL LOW (ref 22–32)
Calcium: 9.1 mg/dL (ref 8.9–10.3)
Chloride: 99 mmol/L (ref 98–111)
Creatinine, Ser: 0.77 mg/dL (ref 0.44–1.00)
GFR, Estimated: >60 mL/min (ref >60)
Glucose, Bld: 214 mg/dL — ABNORMAL HIGH (ref 70–99)
Potassium: 3.5 mmol/L (ref 3.5–5.1)
Sodium: 135 mmol/L (ref 135–145)
Total Bilirubin: 0.7 mg/dL (ref 0.0–1.2)
Total Protein: 8.2 g/dL — ABNORMAL HIGH (ref 6.5–8.1)

## 2024-03-31 LAB — HCG, SERUM, QUALITATIVE: Preg, Serum: NEGATIVE

## 2024-03-31 LAB — LIPASE, BLOOD: Lipase: 13 U/L (ref 11–51)

## 2024-03-31 MED ORDER — DROPERIDOL 2.5 MG/ML IJ SOLN
2.5000 mg | Freq: Once | INTRAMUSCULAR | Status: AC
  Administered 2024-03-31: 2.5 mg via INTRAVENOUS
  Filled 2024-03-31: qty 2

## 2024-03-31 MED ORDER — SODIUM CHLORIDE 0.9 % IV BOLUS
1000.0000 mL | Freq: Once | INTRAVENOUS | Status: AC
  Administered 2024-03-31: 1000 mL via INTRAVENOUS

## 2024-03-31 MED ORDER — MORPHINE SULFATE (PF) 4 MG/ML IV SOLN
4.0000 mg | Freq: Once | INTRAVENOUS | Status: AC
  Administered 2024-03-31: 4 mg via INTRAVENOUS
  Filled 2024-03-31: qty 1

## 2024-03-31 NOTE — ED Notes (Signed)
 Patient in triage attempting to self induce vomiting, emesis bag provided.

## 2024-03-31 NOTE — ED Provider Notes (Incomplete)
  Physical Exam  BP (!) 148/95   Pulse 81   Temp 98.9 F (37.2 C) (Oral)   Resp 19   LMP 03/18/2024   SpO2 99%   Physical Exam  Procedures  Procedures  ED Course / MDM    Medical Decision Making Amount and/or Complexity of Data Reviewed Labs: ordered.  Risk Prescription drug management.   Patient care assumed at shift handoff.  See his note for full details.  In short, 23 year old female patient with PMH significant for cyclical vomiting, diabetic gastroparesis, type I DM, polysubstance use, DKA presents to the emergency department complaining of 4 days of nausea, vomiting, and intermittent abdominal pain.  She reports trying her Reglan  at home with no relief of the nausea and vomiting.  She is requesting pain medication upon arrival.  At the time of shift handoff VBG, beta hydroxybutyric acid, and UA have been ordered.  Pain medication including morphine  and droperidol  have also been ordered.  Plan to reassess patient and follow-up on labs.  Of note patient has been admitted twice over the past 2 weeks for similar symptoms including DKA

## 2024-03-31 NOTE — ED Triage Notes (Signed)
 Patient arrived with complaints of intermittent abdominal pain, NV over the last four days. Given 4mg  zofran  prior to arrival. Hx of gastroparesis.

## 2024-03-31 NOTE — ED Notes (Signed)
 First look staff witnessed the pt stick her finger in her mouth to force herself to vomit.

## 2024-03-31 NOTE — ED Provider Notes (Signed)
 [place] [facility] AT [facility] Provider Note   CSN: [id number] Arrival date & time: 03/31/24  2003     Patient presents with: Abdominal Pain   Judy Cook is a 23 y.o. female with past medical history seen for marijuana use, cyclic vomiting, diabetic gastroparesis, type 1 diabetes, polysubstance use who presents concern for nausea, vomiting, intermittent abdominal pain over the last 4 days. Patient requesting pain medication on arrival, says she really likes dilaudid  because it works the best. Does not take pain medication at home.    Abdominal Pain      Prior to Admission medications   Medication Sig Start Date End Date Taking? Authorizing Provider  ALPRAZolam  (XANAX ) 0.5 MG tablet Take 1 tablet (0.5 mg total) by mouth 2 (two) times daily as needed for anxiety. 03/21/24   Arlon Carliss ORN, DO  cetirizine  (ZYRTEC  ALLERGY) 10 MG tablet Take 1 tablet (10 mg total) by mouth daily. Patient taking differently: Take 10 mg by mouth daily as needed for allergies. 03/24/24: Reports during TOC call, takes prn 07/25/23   Christopher Savannah, PA-C  Continuous Glucose Sensor (DEXCOM G7 SENSOR) MISC CHANGE EVERY 10 DAYS 11/05/23   Shamleffer, Ibtehal Jaralla, MD  ibuprofen  (ADVIL ) 800 MG tablet TAKE 1 TABLET BY MOUTH EVERY 8 HOURS AS NEEDED 03/06/24   Cleotilde Ronal RAMAN, MD  JUNEL  FE 1/20 1-20 MG-MCG tablet Take 1 tablet by mouth daily. Patient taking differently: Take 1 tablet by mouth daily. 03/24/24: reports during [facility] call, often forgets to take consistently 03/18/24   [provider]  metoCLOPramide  (REGLAN ) 5 MG tablet Take 1 tablet (5 mg total) by mouth 3 (three) times daily. Patient not taking: Reported on 03/24/2024 12/21/22 03/23/24  [place], PA-C  Multiple Vitamins-Minerals (MULTIVITAMIN WITH MINERALS) tablet Take 2 tablets by mouth daily.    [provider]  naproxen sodium (ALEVE) 220 MG tablet Take 440 mg by mouth daily as needed (pain).     [provider]  NOVOLOG  100 UNIT/ML injection MAX DAILY 60 UNITS PER PUMP 07/10/23   Shamleffer, Ibtehal Jaralla, MD  sertraline  (ZOLOFT ) 25 MG tablet Take 1 tablet (25 mg total) by mouth daily. Patient not taking: Reported on 03/24/2024 01/04/24   Elnor Lauraine BRAVO, NP  VEVYE 0.1 % SOLN Place 1 drop into both eyes in the morning and at bedtime. 02/01/24   [provider]    Allergies: Patient has no known allergies.    Review of Systems  Gastrointestinal:  Positive for abdominal pain.  All other systems reviewed and are negative.   Updated Vital Signs BP (!) 148/95   Pulse 81   Temp 98.9 F (37.2 C) (Oral)   Resp 19   LMP 03/18/2024   SpO2 99%   Physical Exam Vitals and nursing note reviewed.  Constitutional:      General: She is not in acute distress.    Appearance: Normal appearance.     Comments: Patient sitting in bed in no acute distress, appears comfortable, talking easily, no grimace, not actively retching  HENT:     Head: Normocephalic and atraumatic.  Eyes:     General:        Right eye: No discharge.        Left eye: No discharge.  Cardiovascular:     Rate and Rhythm: Normal rate and regular rhythm.     Heart sounds: No murmur heard.    No friction rub. No gallop.  Pulmonary:  Effort: Pulmonary effort is normal.     Breath sounds: Normal breath sounds.  Abdominal:     General: Bowel sounds are normal.     Palpations: Abdomen is soft.     Comments: Diffuse tenderness to palpation throughout the abdomen, no rebound, rigidity, guarding  Skin:    General: Skin is warm and dry.     Capillary Refill: Capillary refill takes less than 2 seconds.  Neurological:     Mental Status: She is alert and oriented to person, place, and time.  Psychiatric:        Mood and Affect: Mood normal.        Behavior: Behavior normal.     (all labs ordered are listed, but only abnormal results are displayed) Labs Reviewed  COMPREHENSIVE METABOLIC PANEL WITH  GFR - Abnormal; Notable for the following components:      Result Value   CO2 16 (*)    Glucose, Bld 214 (*)    Total Protein 8.2 (*)    Anion gap 20 (*)    All other components within normal limits  CBC - Abnormal; Notable for the following components:   WBC 15.3 (*)    Platelets 486 (*)    All other components within normal limits  LIPASE, BLOOD  HCG, SERUM, QUALITATIVE  URINALYSIS, ROUTINE W REFLEX MICROSCOPIC  BLOOD GAS, VENOUS  BETA-HYDROXYBUTYRIC ACID    EKG: None  Radiology: No results found.   Procedures   Medications Ordered in the ED  sodium chloride  0.9 % bolus 1,000 mL (has no administration in time range)  droperidol  (INAPSINE ) 2.5 MG/ML injection 2.5 mg (has no administration in time range)  morphine  (PF) 4 MG/ML injection 4 mg (has no administration in time range)                                    Medical Decision Making Amount and/or Complexity of Data Reviewed Labs: ordered.   This patient is a 23 y.o. female  who presents to the ED for concern of abdominal pain, nausea, vomiting.   Differential diagnoses prior to evaluation: The emergent differential diagnosis includes, but is not limited to,  The causes of generalized abdominal pain include but are not limited to AAA, mesenteric ischemia, appendicitis, diverticulitis, DKA, gastritis, gastroenteritis, AMI, nephrolithiasis, pancreatitis, peritonitis, adrenal insufficiency,lead poisoning, iron toxicity, intestinal ischemia, constipation, UTI,SBO/LBO, splenic rupture, biliary disease, IBD, IBS, PUD, or hepatitis. This is not an exhaustive differential.   Past Medical History / Co-morbidities / Social History: marijuana use, cyclic vomiting, diabetic gastroparesis, type 1 diabetes, polysubstance use  Additional history: Chart reviewed. Pertinent results include: Reviewed lab work, imaging from previous emergency department evaluations, hospitalizations  Physical Exam: Physical exam performed. The  pertinent findings include:  Patient sitting in bed in no acute distress, appears comfortable, talking easily, no grimace, not actively retching    Diffuse tenderness to palpation throughout the abdomen, no rebound, rigidity, guarding   Lab Tests/Imaging studies: I personally interpreted labs/imaging and the pertinent results include: CBC with leukocytosis, white blood cells 15.3, CMP with bicarb deficit, CO2 16, anion gap of 20 is concerning for possible developing DKA, glucose 214.  Negative serum pregnancy, normal lipase   Medications: I ordered medication including morphine , droperidol , fluid bolus for pain, nausea, dehydration.  I have reviewed the patients home medicines and have made adjustments as needed.   11:24 PM Care of Judy Cook transferred to  PA Ubaldo High and Dr. Trine at the end of my shift as the patient will require reassessment once labs/imaging have resulted. Patient presentation, ED course, and plan of care discussed with review of all pertinent labs and imaging. Please see his/her note for further details regarding further ED course and disposition. Plan at time of handoff is pending reassessment after BHB, VBG, and meds, may require admission for DKA vs discharge after improving nausea, vomiting, abdominal pain. This may be altered or completely changed at the discretion of the oncoming team pending results of further workup.   Final diagnoses:  None    ED Discharge Orders     None          Judy Cook 03/31/24 2324    Judy Elsie CROME, MD 03/31/24 [id number]

## 2024-04-01 ENCOUNTER — Other Ambulatory Visit: Payer: Self-pay

## 2024-04-01 ENCOUNTER — Other Ambulatory Visit (HOSPITAL_COMMUNITY): Payer: Self-pay

## 2024-04-01 ENCOUNTER — Encounter (HOSPITAL_COMMUNITY): Payer: Self-pay | Admitting: Internal Medicine

## 2024-04-01 DIAGNOSIS — E162 Hypoglycemia, unspecified: Secondary | ICD-10-CM | POA: Insufficient documentation

## 2024-04-01 DIAGNOSIS — R112 Nausea with vomiting, unspecified: Secondary | ICD-10-CM | POA: Diagnosis not present

## 2024-04-01 DIAGNOSIS — K219 Gastro-esophageal reflux disease without esophagitis: Secondary | ICD-10-CM | POA: Diagnosis not present

## 2024-04-01 DIAGNOSIS — R1013 Epigastric pain: Secondary | ICD-10-CM | POA: Insufficient documentation

## 2024-04-01 DIAGNOSIS — D72829 Elevated white blood cell count, unspecified: Secondary | ICD-10-CM | POA: Diagnosis not present

## 2024-04-01 LAB — CBC WITH DIFFERENTIAL/PLATELET
Abs Immature Granulocytes: 0.07 K/uL (ref 0.00–0.07)
Basophils Absolute: 0 K/uL (ref 0.0–0.1)
Basophils Relative: 0 %
Eosinophils Absolute: 0.1 K/uL (ref 0.0–0.5)
Eosinophils Relative: 0 %
HCT: 38.1 % (ref 36.0–46.0)
Hemoglobin: 12.8 g/dL (ref 12.0–15.0)
Immature Granulocytes: 1 %
Lymphocytes Relative: 11 %
Lymphs Abs: 1.5 K/uL (ref 0.7–4.0)
MCH: 31.8 pg (ref 26.0–34.0)
MCHC: 33.6 g/dL (ref 30.0–36.0)
MCV: 94.8 fL (ref 80.0–100.0)
Monocytes Absolute: 1 K/uL (ref 0.1–1.0)
Monocytes Relative: 7 %
Neutro Abs: 11.3 K/uL — ABNORMAL HIGH (ref 1.7–7.7)
Neutrophils Relative %: 81 %
Platelets: 455 K/uL — ABNORMAL HIGH (ref 150–400)
RBC: 4.02 MIL/uL (ref 3.87–5.11)
RDW: 14 % (ref 11.5–15.5)
WBC: 13.9 K/uL — ABNORMAL HIGH (ref 4.0–10.5)
nRBC: 0 % (ref 0.0–0.2)

## 2024-04-01 LAB — URINALYSIS, ROUTINE W REFLEX MICROSCOPIC
Bacteria, UA: NONE SEEN
Bilirubin Urine: NEGATIVE
Glucose, UA: >=500 mg/dL — AB
Hgb urine dipstick: NEGATIVE
Ketones, ur: 80 mg/dL — AB
Leukocytes,Ua: NEGATIVE
Nitrite: NEGATIVE
Protein, ur: >=300 mg/dL — AB
Specific Gravity, Urine: 1.021 (ref 1.005–1.030)
pH: 5 (ref 5.0–8.0)

## 2024-04-01 LAB — CBG MONITORING, ED
Glucose-Capillary: 44 mg/dL — CL (ref 70–99)
Glucose-Capillary: 67 mg/dL — ABNORMAL LOW (ref 70–99)
Glucose-Capillary: 226 mg/dL — ABNORMAL HIGH (ref 70–99)
Glucose-Capillary: 530 mg/dL (ref 70–99)
Glucose-Capillary: 552 mg/dL (ref 70–99)
Glucose-Capillary: 428 mg/dL — ABNORMAL HIGH (ref 70–99)
Glucose-Capillary: 217 mg/dL — ABNORMAL HIGH (ref 70–99)

## 2024-04-01 LAB — BASIC METABOLIC PANEL WITH GFR
Anion gap: 11 (ref 5–15)
BUN: 7 mg/dL (ref 6–20)
CO2: 23 mmol/L (ref 22–32)
Calcium: 9.2 mg/dL (ref 8.9–10.3)
Chloride: 102 mmol/L (ref 98–111)
Creatinine, Ser: 0.76 mg/dL (ref 0.44–1.00)
GFR, Estimated: >60 mL/min (ref >60)
Glucose, Bld: 178 mg/dL — ABNORMAL HIGH (ref 70–99)
Potassium: 3.3 mmol/L — ABNORMAL LOW (ref 3.5–5.1)
Sodium: 136 mmol/L (ref 135–145)

## 2024-04-01 LAB — URINE DRUG SCREEN
Amphetamines: NEGATIVE
Barbiturates: NEGATIVE
Benzodiazepines: NEGATIVE
Cocaine: POSITIVE — AB
Fentanyl: NEGATIVE
Methadone Scn, Ur: NEGATIVE
Opiates: NEGATIVE
Tetrahydrocannabinol: POSITIVE — AB

## 2024-04-01 LAB — BETA-HYDROXYBUTYRIC ACID
Beta-Hydroxybutyric Acid: 2.68 mmol/L — ABNORMAL HIGH (ref 0.05–0.27)
Beta-Hydroxybutyric Acid: 1.86 mmol/L — ABNORMAL HIGH (ref 0.05–0.27)

## 2024-04-01 LAB — BLOOD GAS, VENOUS
Acid-base deficit: 0.7 mmol/L (ref 0.0–2.0)
Bicarbonate: 24.2 mmol/L (ref 20.0–28.0)
O2 Saturation: 98.7 %
Patient temperature: 37
pCO2, Ven: 40 mmHg — ABNORMAL LOW (ref 44–60)
pH, Ven: 7.39 (ref 7.25–7.43)
pO2, Ven: 78 mmHg — ABNORMAL HIGH (ref 32–45)

## 2024-04-01 LAB — GLUCOSE, CAPILLARY
Glucose-Capillary: 185 mg/dL — ABNORMAL HIGH (ref 70–99)
Glucose-Capillary: 266 mg/dL — ABNORMAL HIGH (ref 70–99)
Glucose-Capillary: 222 mg/dL — ABNORMAL HIGH (ref 70–99)
Glucose-Capillary: 182 mg/dL — ABNORMAL HIGH (ref 70–99)

## 2024-04-01 MED ORDER — ONDANSETRON HCL 4 MG/2ML IJ SOLN: 4.0000 mg | Freq: Four times a day (QID) | INTRAMUSCULAR | Status: DC | PRN

## 2024-04-01 MED ORDER — LACTATED RINGERS IV BOLUS
1000.0000 mL | Freq: Once | INTRAVENOUS | Status: AC
  Administered 2024-04-01: 1000 mL via INTRAVENOUS

## 2024-04-01 MED ORDER — MORPHINE SULFATE (PF) 4 MG/ML IV SOLN: 4.0000 mg | INTRAVENOUS | Status: DC | PRN

## 2024-04-01 MED ORDER — LACTATED RINGERS IV SOLN: INTRAVENOUS | Status: AC

## 2024-04-01 MED ORDER — PROMETHAZINE HCL 25 MG RE SUPP: 25.0000 mg | Freq: Four times a day (QID) | RECTAL | Status: DC | PRN

## 2024-04-01 MED ORDER — KETOROLAC TROMETHAMINE 30 MG/ML IJ SOLN
30.0000 mg | Freq: Four times a day (QID) | INTRAMUSCULAR | Status: DC | PRN
  Administered 2024-04-01 – 2024-04-02 (×2): 30 mg via INTRAVENOUS
  Filled 2024-04-01 (×3): qty 1

## 2024-04-01 MED ORDER — LORAZEPAM 2 MG/ML IJ SOLN: 1.0000 mg | Freq: Four times a day (QID) | INTRAMUSCULAR | Status: DC | PRN

## 2024-04-01 MED ORDER — INSULIN ASPART 100 UNIT/ML IJ SOLN
0.0000 [IU] | Freq: Three times a day (TID) | INTRAMUSCULAR | 11 refills | Status: DC
  Filled 2024-04-01: qty 10, 37d supply, fill #0

## 2024-04-01 MED ORDER — LORAZEPAM 2 MG/ML IJ SOLN
1.0000 mg | Freq: Once | INTRAMUSCULAR | Status: AC
  Administered 2024-04-01: 1 mg via INTRAVENOUS
  Filled 2024-04-01: qty 1

## 2024-04-01 MED ORDER — POTASSIUM CHLORIDE CRYS ER 20 MEQ PO TBCR
40.0000 meq | EXTENDED_RELEASE_TABLET | Freq: Once | ORAL | Status: DC
  Filled 2024-04-01: qty 2

## 2024-04-01 MED ORDER — METOCLOPRAMIDE HCL 5 MG/ML IJ SOLN
10.0000 mg | Freq: Four times a day (QID) | INTRAMUSCULAR | Status: DC | PRN
  Administered 2024-04-01 – 2024-04-02 (×3): 10 mg via INTRAVENOUS
  Filled 2024-04-01 (×3): qty 2

## 2024-04-01 MED ORDER — INSULIN ASPART 100 UNIT/ML IJ SOLN
0.0000 [IU] | INTRAMUSCULAR | Status: DC
  Administered 2024-04-01: 15 [IU] via SUBCUTANEOUS
  Administered 2024-04-01: 5 [IU] via SUBCUTANEOUS
  Administered 2024-04-01: 3 [IU] via SUBCUTANEOUS
  Administered 2024-04-02: 2 [IU] via SUBCUTANEOUS
  Administered 2024-04-02: 5 [IU] via SUBCUTANEOUS
  Administered 2024-04-02: 2 [IU] via SUBCUTANEOUS
  Filled 2024-04-01: qty 5
  Filled 2024-04-01: qty 9
  Filled 2024-04-01: qty 5
  Filled 2024-04-01 (×2): qty 3
  Filled 2024-04-01: qty 2

## 2024-04-01 MED ORDER — ACETAMINOPHEN 325 MG PO TABS
650.0000 mg | ORAL_TABLET | Freq: Four times a day (QID) | ORAL | Status: DC | PRN
  Administered 2024-04-01 – 2024-04-02 (×2): 650 mg via ORAL
  Filled 2024-04-01 (×3): qty 2

## 2024-04-01 MED ORDER — ONDANSETRON HCL 4 MG/2ML IJ SOLN
4.0000 mg | Freq: Once | INTRAMUSCULAR | Status: AC
  Administered 2024-04-01: 4 mg via INTRAVENOUS
  Filled 2024-04-01: qty 2

## 2024-04-01 MED ORDER — PANTOPRAZOLE SODIUM 40 MG IV SOLR
40.0000 mg | Freq: Two times a day (BID) | INTRAVENOUS | Status: DC
  Administered 2024-04-01 – 2024-04-02 (×4): 40 mg via INTRAVENOUS
  Filled 2024-04-01 (×4): qty 10

## 2024-04-01 MED ORDER — NALOXONE HCL 0.4 MG/ML IJ SOLN: 0.4000 mg | INTRAMUSCULAR | Status: DC | PRN

## 2024-04-01 MED ORDER — ACETAMINOPHEN 325 MG PO TABS: 650.0000 mg | ORAL_TABLET | Freq: Four times a day (QID) | ORAL | Status: AC | PRN

## 2024-04-01 MED ORDER — DEXTROSE 50 % IV SOLN: 1.0000 | INTRAVENOUS | Status: DC | PRN

## 2024-04-01 MED ORDER — ONDANSETRON HCL 4 MG/2ML IJ SOLN
4.0000 mg | Freq: Four times a day (QID) | INTRAMUSCULAR | Status: DC | PRN
  Administered 2024-04-01 – 2024-04-02 (×2): 4 mg via INTRAVENOUS
  Filled 2024-04-01 (×3): qty 2

## 2024-04-01 MED ORDER — MELATONIN 3 MG PO TABS: 3.0000 mg | ORAL_TABLET | Freq: Every evening | ORAL | Status: DC | PRN

## 2024-04-01 MED ORDER — ONDANSETRON 4 MG PO TBDP
4.0000 mg | ORAL_TABLET | Freq: Three times a day (TID) | ORAL | 0 refills | Status: DC | PRN
  Filled 2024-04-01: qty 20, 7d supply, fill #0

## 2024-04-01 MED ORDER — INSULIN GLARGINE 100 UNIT/ML ~~LOC~~ SOLN
SUBCUTANEOUS | 0 refills | Status: DC
  Filled 2024-04-01: qty 10, 28d supply, fill #0

## 2024-04-01 MED ORDER — DEXTROSE 50 % IV SOLN
1.0000 | Freq: Once | INTRAVENOUS | Status: AC
  Administered 2024-04-01: 50 mL via INTRAVENOUS
  Filled 2024-04-01: qty 50

## 2024-04-01 MED ORDER — ACETAMINOPHEN 650 MG RE SUPP: 650.0000 mg | Freq: Four times a day (QID) | RECTAL | Status: DC | PRN

## 2024-04-01 MED ORDER — ONDANSETRON 4 MG PO TBDP: 4.0000 mg | ORAL_TABLET | Freq: Three times a day (TID) | ORAL | Status: DC | PRN

## 2024-04-01 NOTE — Plan of Care (Signed)
  Problem: Safety: Goal: Ability to remain free from injury will improve Outcome: Progressing   Problem: Skin Integrity: Goal: Risk for impaired skin integrity will decrease Outcome: Progressing   Problem: Elimination: Goal: Will not experience complications related to urinary retention Outcome: Progressing   Problem: Elimination: Goal: Will not experience complications related to bowel motility Outcome: Progressing

## 2024-04-01 NOTE — ED Notes (Addendum)
 Provided patient with orange juice sandwich and graham crackers and patient has disconnected her insulin  pump. Patient educated on the importance of not using her insulin  pump while here in the hospital. Patient verbalized understanding. Provider at bedside and aware.

## 2024-04-01 NOTE — ED Notes (Addendum)
 Pt asked for a charging block for her phone and I told her we did not have one. She asked for me to charge it since she had the cord. I took the phone to the nurses station and charged it. She requested it charge to 80%. Orange iphone 17 pro max

## 2024-04-01 NOTE — Progress Notes (Signed)
 Update:  Patient was doing well, tolerated lunch, no issues.  Discharge written and immediately after notification of discharge patient acutely decompensated with active retching (unsure if actual vomiting).  She reported terrible abdominal pain and requested morphine .  I went to see the patient. She was sleeping comfortably upon my arrival but after being awakened immediately complained of abdominal pain, needing morphine , and started loudly retching as soon as I left the room.  Will continue to observe overnight. Pain medication with Toradol ; narcotics are not indicated for cyclical vomiting and cause gastric motility issues that can exacerbate the condition. Will continue IV Reglan  and Zofran  prn with rectal phenergan  as needed for breakthrough symptoms.  Delon CHARLENA Herald, M.D.

## 2024-04-01 NOTE — Assessment & Plan Note (Signed)
 Continue alprazolam  No longer taking sertraline 

## 2024-04-01 NOTE — ED Notes (Signed)
BS = 226.

## 2024-04-01 NOTE — Assessment & Plan Note (Signed)
 Presented with acute onset of n/v following ETOH and marijuana use Improved, now tolerating diet Appears stable for dc at this time

## 2024-04-01 NOTE — ED Notes (Signed)
 New IV In place for patient.

## 2024-04-01 NOTE — H&P (Signed)
 History and Physical      Judy Cook FMW:[id number] DOB: 2000/07/28 DOA: 03/31/2024; DOS: 04/01/2024  PCP: Elnor Lauraine BRAVO, NP  Patient coming from: home   I have personally briefly reviewed patient's old medical records in [facility]  Chief Complaint: n/v  HPI: Judy Cook is a 23 y.o. female with medical history significant for type 1 diabetes mellitus complicated by diabetic gastroparesis, GERD, hyperemesis cannabinoid syndrome, who is admitted to [facility] on 03/31/2024 with intractable nausea/vomiting after presenting from home to [facility] ED complaining of nausea, vomiting.   Patient with a history of type 1 diabetes mellitus on home insulin  pump, with most recent hemoglobin A1c level of 6.7% when checked on 01/10/2024.  Her history of type 1 diabetes is complicated by documentation of a history of diabetic gastroparesis.   She has been hospitalized twice over the last 2 weeks for intractable nausea/vomiting in the setting of suspected hyperemesis cannabinoid syndrome, complicated by diabetic gastroparesis, with most recent prior hospitalization occurring from 03/22/2024 to 03/23/2024.   She presents this evening with 3-4 days of recurrent nausea resulting in at least 3-4 daily episodes of nonbloody, nonbilious emesis over that timeframe, resulting in significant decline in oral intake over that timeframe.  Denies any associated diarrhea, nor any recent melena or hematochezia.  She also notes some mild epigastric discomfort over that timeframe, worse with palpation.  Denies any associated subjective fever, chills, rigors, or generalized myalgias.  No recent dysuria or gross hematuria.  Denies any recent shortness of breath, cough, nor any recent chest pain.   She reports good compliance with her home insulin  pump.     ED Course:  Vital signs in the ED were notable for the following: Afebrile; heart rates in the 80s to low 100s; systolic blood pressures  in the 120s to 140s; respiratory rate 18-19, and oxygen saturation 98 to 100% on room air.  Labs were notable for the following: Initial CBG noted to be 44, with insulin  pump running.  After eating a few crackers, with insulin  pump still running, CBG was noted to be 67.  At that point, insulin  pump was held, she received an amp of D50, with ensuing increase in CBG to 226.  ABG notable for the following: 7.3 9/40/78/20 4.2.  Beta-hydroxybutyrate acid 2.68.  CMP notable for the following: Sodium 135, bicarbonate 16, anion gap 20, creatinine 0.77 compared to 0.66 on 03/23/2024, and liver enzymes were within normal limits.  Lipase 13.  CBC notable for white blood cell count 15,300 compared to 10,800 on 03/23/2024, hemoglobin 13.5, platelet count 486.  Qualitative serum beta-hCG was negative.  Urinalysis showed no white blood cells, no bacteria, and was leukocyte esterase/nitrate negative, while demonstrating the presence hyaline casts as well as specific gravity of 1.021.  Per my interpretation, EKG in ED demonstrated the following: No EKG performed in the ED today.  Imaging in the ED, per corresponding formal radiology read, was notable for the following: No imaging performed in the ED today.  While in the ED, the following were administered: 1 amp of D50, morphine  4 mg IV x 1 dose, normal saline x 1 L bolus.  Subsequently, the patient was admitted for further evaluation management of intractable nausea/vomiting, with presenting labs also notable for leukocytosis.     Review of Systems: As per HPI otherwise 10 point review of systems negative.   Past Medical History:  Diagnosis Date   Allergic rhinitis 07/07/2021   COVID 2020  mild   Diabetes mellitus (HCC)    Type 1, type 5   Dysmenorrhea 02/20/2019   GERD (gastroesophageal reflux disease)    Headache    History of herpes genitalis 06/24/2021   Intractable vomiting    SIRS (systemic inflammatory response syndrome) (HCC) 12/03/2019     Past Surgical History:  Procedure Laterality Date   DILATION AND EVACUATION N/A 07/05/2022   Procedure: DILATATION AND EVACUATION;  Surgeon: Izell Harari, MD;  Location: [facility] OR;  Service: Gynecology;  Laterality: N/A;   OPERATIVE ULTRASOUND N/A 07/05/2022   Procedure: OPERATIVE ULTRASOUND;  Surgeon: Izell Harari, MD;  Location: [facility] OR;  Service: Gynecology;  Laterality: N/A;    Social History:  reports that she has quit smoking. Her smoking use included cigarettes. She has never used smokeless tobacco. She reports current alcohol use. She reports current drug use. Drug: Marijuana.   No Known Allergies  Family History  Problem Relation Age of Onset   Diabetes Paternal Grandfather    Heart attack Paternal Grandfather    Diabetes Paternal Grandmother    Cancer Maternal Grandmother        lung   Alcohol abuse Maternal Grandfather    Alcohol abuse Father    Drug abuse Father    High Cholesterol Father    Alcohol abuse Mother    Depression Mother    Drug abuse Mother     Family history reviewed and not pertinent    Prior to Admission medications   Medication Sig Start Date End Date Taking? Authorizing Provider  ALPRAZolam  (XANAX ) 0.5 MG tablet Take 1 tablet (0.5 mg total) by mouth 2 (two) times daily as needed for anxiety. 03/21/24   Arlon Carliss ORN, DO  cetirizine  (ZYRTEC  ALLERGY) 10 MG tablet Take 1 tablet (10 mg total) by mouth daily. Patient taking differently: Take 10 mg by mouth daily as needed for allergies. 03/24/24: Reports during TOC call, takes prn 07/25/23   Christopher Savannah, PA-C  Continuous Glucose Sensor (DEXCOM G7 SENSOR) MISC CHANGE EVERY 10 DAYS 11/05/23   Shamleffer, Ibtehal Jaralla, MD  ibuprofen  (ADVIL ) 800 MG tablet TAKE 1 TABLET BY MOUTH EVERY 8 HOURS AS NEEDED 03/06/24   Cleotilde Ronal RAMAN, MD  JUNEL  FE 1/20 1-20 MG-MCG tablet Take 1 tablet by mouth daily. Patient taking differently: Take 1 tablet by mouth daily. 03/24/24: reports during [facility] call, often  forgets to take consistently 03/18/24   [provider]  metoCLOPramide  (REGLAN ) 5 MG tablet Take 1 tablet (5 mg total) by mouth 3 (three) times daily. Patient not taking: Reported on 03/24/2024 12/21/22 03/23/24  [place], PA-C  Multiple Vitamins-Minerals (MULTIVITAMIN WITH MINERALS) tablet Take 2 tablets by mouth daily.    [provider]  naproxen sodium (ALEVE) 220 MG tablet Take 440 mg by mouth daily as needed (pain).    [provider]  NOVOLOG  100 UNIT/ML injection MAX DAILY 60 UNITS PER PUMP 07/10/23   Shamleffer, Ibtehal Jaralla, MD  sertraline  (ZOLOFT ) 25 MG tablet Take 1 tablet (25 mg total) by mouth daily. Patient not taking: Reported on 03/24/2024 01/04/24   Elnor Lauraine BRAVO, NP  VEVYE 0.1 % SOLN Place 1 drop into both eyes in the morning and at bedtime. 02/01/24   [provider]     Objective    Physical Exam: Vitals:   03/31/24 2245 03/31/24 2300 03/31/24 2315 04/01/24 0131  BP:    122/67  Pulse: 80 90 97 (!) 102  Resp:    18  Temp:  98.3 F (36.8 C)  TempSrc:      SpO2: 99% 100% 100% 100%    General: appears to be stated age; somnolent, but easily arrousable  Skin: warm, dry, no rash Head:  AT/[place] Mouth:  Oral mucosa membranes appear dry, normal dentition Neck: supple; trachea midline Heart:  mildly tachycardic, but regular; did not appreciate any M/R/G Lungs: CTAB, did not appreciate any wheezes, rales, or rhonchi Abdomen: + BS; soft, ND, mild tenderness palpation over the epigastrium, in the absence of associated guarding, rigidity, or rebound tenderness Vascular: 2+ pedal pulses b/l; 2+ radial pulses b/l Extremities: no peripheral edema, no muscle wasting     Labs on Admission: I have personally reviewed following labs and imaging studies  CBC: Recent Labs  Lab 03/31/24 2017  WBC 15.3*  HGB 13.5  HCT 39.5  MCV 94.3  PLT 486*   Basic Metabolic Panel: Recent Labs  Lab 03/31/24 2017  NA 135  K 3.5  CL 99   CO2 16*  GLUCOSE 214*  BUN 9  CREATININE 0.77  CALCIUM  9.1   GFR: Estimated Creatinine Clearance: 94.4 mL/min (by C-G formula based on SCr of 0.77 mg/dL). Liver Function Tests: Recent Labs  Lab 03/31/24 2017  AST 28  ALT 18  ALKPHOS 76  BILITOT 0.7  PROT 8.2*  ALBUMIN 4.7   Recent Labs  Lab 03/31/24 2017  LIPASE 13   No results for input(s): AMMONIA in the last 168 hours. Coagulation Profile: No results for input(s): INR, PROTIME in the last 168 hours. Cardiac Enzymes: No results for input(s): CKTOTAL, CKMB, CKMBINDEX, TROPONINI in the last 168 hours. BNP (last 3 results) No results for input(s): PROBNP in the last 8760 hours. HbA1C: No results for input(s): HGBA1C in the last 72 hours. CBG: Recent Labs  Lab 04/01/24 0125  GLUCAP 44*   Lipid Profile: No results for input(s): CHOL, HDL, LDLCALC, TRIG, CHOLHDL, LDLDIRECT in the last 72 hours. Thyroid  Function Tests: No results for input(s): TSH, T4TOTAL, FREET4, T3FREE, THYROIDAB in the last 72 hours. Anemia Panel: No results for input(s): VITAMINB12, FOLATE, FERRITIN, TIBC, IRON, RETICCTPCT in the last 72 hours. Urine analysis:    Component Value Date/Time   COLORURINE YELLOW 03/31/2024 0011   APPEARANCEUR CLEAR 03/31/2024 0011   LABSPEC 1.021 03/31/2024 0011   PHURINE 5.0 03/31/2024 0011   GLUCOSEU >=500 (A) 03/31/2024 0011   GLUCOSEU 250 (A) 09/21/2022 1011   HGBUR NEGATIVE 03/31/2024 0011   BILIRUBINUR NEGATIVE 03/31/2024 0011   BILIRUBINUR Negative 08/02/2023 1633   KETONESUR 80 (A) 03/31/2024 0011   PROTEINUR >=300 (A) 03/31/2024 0011   UROBILINOGEN 0.2 08/02/2023 1633   UROBILINOGEN 0.2 09/21/2022 1011   NITRITE NEGATIVE 03/31/2024 0011   LEUKOCYTESUR NEGATIVE 03/31/2024 0011    Radiological Exams on Admission: No results found.    Assessment/Plan    Principal Problem:   Intractable nausea and vomiting Active Problems:    Leukocytosis   GERD (gastroesophageal reflux disease)   Dehydration   History of type 1 diabetes mellitus   Epigastric pain   Hypoglycemia      #) Intractable nausea/vomiting: Recurrent nausea over the last 3 to 4 days associated with nonbloody, nonbilious emesis resulting in significant decline in oral intake over that timeframe.  She has a history of recurrent hospitalizations for such in the setting of a history of hyperemesis cannabinoid syndrome as well as diabetic gastroparesis, with this evening's differential including the latter 2 possibilities.  Will check urinary drug screen to further evaluate, we  will pursue prn IV Reglan  for nausea.   No evidence of acute peritoneal findings on physical exam.  No evidence of acute transaminitis on presenting CMP, while presenting lipase is also nonelevated.  Urinalysis demonstrates no evidence to suggest underlying urinary tract infection.   Also considered the possibility of developing DKA in this patient.  While her initial CMP drawn 6 to 7 hours ago showed mild elevation in anion gap at 20 with mild elevation in bhba, her VBG, which showed no evidence of metabolic acidemia, appeared inconsistent with diabetic ketoacidosis.  No significant elevation in her blood sugar has been noted in the ED, with blood sugar at times on the low side, with CBG of 44 while on her home insulin  pump.  This inconsistent constellation of findings appears to warrant additional evaluation to further evaluate for the presence of evolving DKA.  For this purpose, I have ordered stat BMP as well as stat repeat beta-hydroxybutyrate acid to further assess this possibility and to help guide need for initiation of insulin  drip, with these lab results pending at this time.  Plan: Stat BMP and stat beta-hydroxybutyrate acid level, as above.  Lactated Ringer 's x 1 L bolus followed by continuous LR running at 125 cc/h x 12 hours.  Monitor strict I's and O's and daily weights.  Add on  serum magnesium  level.  Repeat CMP, CBC in the morning .check urinary drug screen.  Prn IV Reglan  for nausea/vomiting.  As needed IV Ativan  for nausea/vomiting refractory to Reglan .  As patient is insulin  pump was held in the ED due to hypoglycemia, will pursue every 4 hours CBG monitoring with low-dose sliding scale insulin  and closely monitor for aforementioned stat BMP and stat beta-hydroxybutyrate acid level.                     # (Epigastric abdominal discomfort: In the absence of associated peritoneal findings on physical exam.  Differential includes contribution from hyperemesis cannabinoid syndrome, as above, as well as potential intubation from documented history of diabetic gastroparesis versus muscular strain given the frequency of patient's vomiting episodes over the last few days.  She also has a history of GERD, but does not appear to be on any H2 blocker or PPI as an outpatient.  As noted above, no evidence of acute transaminitis on presenting labs, while lipase is also nonelevated.  Plan: Further evaluation management of presenting intractable nausea/vomiting, as above, including assessment for potential evolving DKA, as above.  Stat BMP and stat beta-hydroxybutyrate acid level, as above.  Check urinary drug screen.  As needed IV morphine .  Prn IV Reglan  for nausea/vomiting.  Incentive spirometry.  Protonix  40 mg IV twice daily.  Repeat CMP, CBC in the morning.                   #) Leukocytosis: Mildly elevated presenting white blood cell count noted to be 15,300, increased from most recent prior value of 10,800 on 03/23/2024.  Suspect that this is multifactorial in etiology, with suspected reactive contribution in the setting of her recurrent nausea/vomiting as well as a suspected contribution from hemoconcentration in the context of clinical evidence of dehydration, as further detailed below, which appears consistent with concomitant thrombocytosis, as  quantified above.  No evidence of overt underlying infectious process at this time, including urinalysis that is inconsistent with UTI.  No acute respiratory symptoms to increase index of suspicion for pneumonia or underlying viral respiratory infection.  Additionally, no evidence of acute transaminitis  on presenting CMP.  In the absence of evidence of underlying factious process, criteria for sepsis are not currently met at this time.  As the patient appears hemodynamically stable and in the absence of evidence of underlying factious process, we will refrain from initiation of antibiotics at this time.  Plan: Further evaluation management of presenting intractable nausea/vomiting as above, including IV fluids, as above.  Monitor strict I's and O's and daily weights.  Repeat CMP, CBC in the morning.  Check urinary drug screen.                          #) Dehydration: Clinical suspicion for such, including the appearance of dry oral mucous membranes as well as laboratory findings notable for UA demonstrating elevated specific gravity, and presence of hyaline casts.  Appears to be in the setting of   increase in GI losses over the last 3 to 4 days in the form of presenting recurrent nausea/vomiting, concomitant with decline in oral intake over that timeframe.  No e/o associated hypotension.  She has received a 1 L NS bolus in the ED this evening.   Plan: Monitor strict I's and O's.  Daily weights.  CMP in the morning.  1 L lactated ringer  bolus followed by continuous lactated Ringer 's at 125 cc/h x 12 hours.                     #) Type 1 diabetes mellitus: Documented history of such, on home insulin  pump, with most recent hemoglobin A1c noted to be 6.7% when checked on 01/10/2024.  Documentation of a history of diabetic gastroparesis, as above, potentially contributing to her presenting recurrent nausea/vomiting as well as epigastric discomfort.  Of note, while on  her home insulin  pump at basal rate, she is found to be hypoglycemic, with CBG of 44 in the absence of insulin  bolus.  As a result of hypoglycemia induced by the patient's basal insulin  rate on her home insulin  pump, I will order diabetic educator consult to evaluate the settings and functionality of her home insulin  pump.  In the meantime, her home insulin  pump is being held.  Additionally, pursuing further assessment for potential evolving DKA, as above, with stat BMP and stat beta-hydroxybutyrate acid level currently pending.   Plan: Diabetic educator consult for assessment of functionality and settings regarding her home insulin  pump, as above.  Monitor strict I's and O's and daily weights.  Every 4 hours CBG monitoring with low-dose sliding scale insulin .  As needed IV Reglan  for nausea/vomiting.  prn amp of D50 every hour as needed for CBG result less than 70.  IV fluids, as above.  Repeat CMP, CBC in the morning.  Check serum magnesium  level.                     #) GERD: documented h/o such; not on an H2 blocker or PPI as an outpatient.  In the context of her presenting epigastric discomfort, we will initiate IV Protonix  at this time, as below.  Plan: Protonix  40 mg IV twice daily, with first dose now.       DVT prophylaxis: SCD's   Code Status: Full code Family Communication: none Disposition Plan: Per Rounding Team Consults called: none;  Admission status: obs    I SPENT GREATER THAN 75  MINUTES IN CLINICAL CARE TIME/MEDICAL DECISION-MAKING IN COMPLETING THIS ADMISSION.     Eva KATHEE Pore DO [facility] From  7PM - 7AM   04/01/2024, 2:29 AM

## 2024-04-01 NOTE — Progress Notes (Signed)
 Discharge medications delivered to patient at the bedside.

## 2024-04-01 NOTE — ED Notes (Signed)
 Multiple attempts made to send a BMP to lab. It was recollected at least 3 times. Each time, it was reported that it hemolyzed. It was drawn from the IV site. The last time it hemolyzed, lab suggested to do a straight stick to get the sample. Same was attempted and was unsuccessful. House phlebotomy was called to no avail. MD made aware.

## 2024-04-01 NOTE — ED Notes (Addendum)
 Patient self administered 2 units of insulin  (from her pump that was disconnected while nurse and provider were at bedside.) Patient educated not to use pump while here in the hospital. Patient verbalized understanding. Patient reminded to keep pump disconnected and we will maintain her BS here in the hospital before I left the room. Pump removed from the room at this time and placed in a patient bag with patient name on it.

## 2024-04-01 NOTE — Assessment & Plan Note (Signed)
 Continue Relgan

## 2024-04-01 NOTE — ED Notes (Signed)
 Pt given lunch tray.

## 2024-04-01 NOTE — Inpatient Diabetes Management (Signed)
 Inpatient Diabetes Program Recommendations  AACE/ADA: New Consensus Statement on Inpatient Glycemic Control (2015)  Target Ranges:  Prepandial:   less than 140 mg/dL      Peak postprandial:   less than 180 mg/dL (1-2 hours)      Critically ill patients:  140 - 180 mg/dL   Lab Results  Component Value Date   GLUCAP 428 (H) 04/01/2024   HGBA1C 6.7 (A) 01/10/2024    Review of Glycemic Control  Diabetes history: T1DM Outpatient Diabetes medications: Tandum Insulin  pump with Novolog , Dexcom G7 Current orders for Inpatient glycemic control: Novolog  0-9 Q4H  HgbA1C - 6.7% BHB - 1.86 CO2 - 16 AG - 20  According to labs, pt is likely in DKA, Pt was instructed to remove insulin  pump at 0600 this am with no basal insulin  ordered.  [facility]  Inpatient Diabetes Program Recommendations:    Consider:  Starting IV insulin  per [employer] for DKA  Will need to f/u with Endo prior to restarting insulin  pump and inquire about decreasing basal rate if needed per MD.  Pump settings: Basal: 0000 - 0.8 units/H I:C ratio - 1:1 - 8 grams with meal and 4 grams with snack Sensitivity 45 with goal of 120  When not on insulin  pump:  Lantus  18 units daily Novolog  0-9 TID with meals and 0-5 HS Novolog  3-4 units TID with meals if eating  Spoke with pt at bedside in ED. Pt states she was told to take her pump off. No basal insulin  given, blood sugar up to 552, 530, 428. (Given 15 units of Novolog  after 3 hours) Pt to f/u with Dr Sam.  Thank you. Shona Brandy, RD, LDN, CDCES Inpatient Diabetes Coordinator [phone]

## 2024-04-01 NOTE — Discharge Summary (Addendum)
 Physician Discharge Summary   Patient: Judy Cook MRN: [id number] DOB: 03/31/2001  Admit date:     03/31/2024  Discharge date: 04/01/24  Discharge Physician: Delon Herald   PCP: Elnor Lauraine BRAVO, NP   Recommendations at discharge:   Wait to resume insulin  pump until discussion with Dr. Sam In the meantime, Lantus  18 units daily with 3-4 units with meals and sensitive scale SSI is recommended Do NOT use marijuana! Follow up with NP Elnor in 1-2 weeks  Discharge Diagnoses: Principal Problem:   Intractable nausea and vomiting Active Problems:   Leukocytosis   GERD (gastroesophageal reflux disease)   Marijuana use   History of type 1 diabetes mellitus   GAD (generalized anxiety disorder)   Hospital Course: 23yo with h/o T1DM complicated by gastroparesis and cannabinoid hyperemesis who presented on 12/8 with n/v. Possible developing/mild DKA on presentation.  Now stable, eating, and wants to go home.  Assessment and Plan:   Assessment & Plan Intractable nausea and vomiting Presented with acute onset of n/v following ETOH and marijuana use Improved, now tolerating diet Appears stable for dc at this time Leukocytosis Mild, likely reactive in nature No current concern for infection History of type 1 diabetes mellitus Last A1c was 6.7, good control Wears insulin  pump at home Concern for developing DKA on presentation but this has resolved Wait to resume insulin  pump until discussion with Dr. Sam In the meantime, Lantus  18 units daily with 3-4 units with meals and sensitive scale SSI is recommended Carb modified diet  GERD (gastroesophageal reflux disease) Continue Relgan Marijuana use Cessation encouraged GAD (generalized anxiety disorder) Continue alprazolam  No longer taking sertraline       Consultants: DM coordinator  Procedures: None  Antibiotics: None   Pain control - [place]  Controlled Substance Reporting System database  was reviewed. and patient was instructed, not to drive, operate heavy machinery, perform activities at heights, swimming or participation in water activities or provide baby-sitting services while on Pain, Sleep and Anxiety Medications; until their outpatient Physician has advised to do so again. Also recommended to not to take more than prescribed Pain, Sleep and Anxiety Medications.   Disposition: Home Diet recommendation:  Carb modified diet DISCHARGE MEDICATION: Allergies as of 04/01/2024   No Known Allergies      Medication List     PAUSE taking these medications    NovoLOG  100 UNIT/ML injection Wait to take this until your doctor or other care provider tells you to start again. Generic drug: insulin  aspart MAX DAILY 60 UNITS PER PUMP You also have another medication with the same name that you may need to continue taking. What changed: See the new instructions.       STOP taking these medications    ibuprofen  800 MG tablet Commonly known as: ADVIL    sertraline  25 MG tablet Commonly known as: ZOLOFT        TAKE these medications    acetaminophen  325 MG tablet Commonly known as: TYLENOL  Take 2 tablets (650 mg total) by mouth every 6 (six) hours as needed for mild pain (pain score 1-3) (or Fever >/= 101).   ALPRAZolam  0.5 MG tablet Commonly known as: XANAX  Take 1 tablet (0.5 mg total) by mouth 2 (two) times daily as needed for anxiety.   cetirizine  10 MG tablet Commonly known as: ZyrTEC  Allergy Take 1 tablet (10 mg total) by mouth daily. What changed:  when to take this reasons to take this additional instructions   Dexcom G7 Sensor Misc CHANGE EVERY  10 DAYS   insulin  glargine 100 UNIT/ML injection Commonly known as: LANTUS  18 units daily (when not on insulin  pump)   Junel  FE 1/20 1-20 MG-MCG tablet Generic drug: norethindrone -ethinyl estradiol -FE Take 1 tablet by mouth daily. What changed: additional instructions   metoCLOPramide  5 MG  tablet Commonly known as: Reglan  Take 1 tablet (5 mg total) by mouth 3 (three) times daily.   multivitamin with minerals tablet Take 2 tablets by mouth daily.   naproxen sodium 220 MG tablet Commonly known as: ALEVE Take 440 mg by mouth daily as needed (pain).   insulin  aspart 100 UNIT/ML injection Commonly known as: novoLOG  Inject 0-9 Units into the skin 3 (three) times daily with meals. CBG 70 - 120: 0 units CBG 121 - 150: 1 unit CBG 151 - 200: 2 units CBG 201 - 250: 3 units CBG 251 - 300: 5 units CBG 301 - 350: 7 units CBG 351 - 400: 9 units CBG > 400: call MD What changed: Another medication with the same name was paused. Ask your nurse or doctor if you should take this medication.   ondansetron  4 MG disintegrating tablet Commonly known as: ZOFRAN -ODT Take 1 tablet (4 mg total) by mouth every 8 (eight) hours as needed for nausea or vomiting.   Vevye 0.1 % Soln Generic drug: cycloSPORINE Place 1 drop into both eyes in the morning and at bedtime.        Follow-up Information     Elnor Lauraine BRAVO, NP. Schedule an appointment as soon as possible for a visit in 1 week(s).   Specialty: Nurse Practitioner Contact information: [address] [place] [address] [phone]         Shamleffer, Donell Cardinal, MD. Schedule an appointment as soon as possible for a visit.   Specialties: Endocrinology, Radiology Contact information: [address] [place] [address] [place] [address] [phone]                Discharge Exam:    Subjective: Feeling better this AM, eager to eat.  Last emesis was last night.  After admission, patient felt well enough to go home.   Objective: Vitals:   04/01/24 1132 04/01/24 1338  BP:  134/85  Pulse:  89  Resp:  20  Temp: 98.3 F (36.8 C) 98.5 F (36.9 C)  SpO2:  100%   No intake or output data in the 24 hours ending 04/01/24 1505 Filed Weights   04/01/24 1338  Weight: 66.2 kg    Exam:  General:  Appears calm  and comfortable and is in NAD Eyes:  normal lids, iris ENT:  grossly normal hearing, lips & tongue, mmm Cardiovascular:  RRR. No LE edema.  Respiratory:   CTA bilaterally with no wheezes/rales/rhonchi.  Normal respiratory effort. Abdomen:  soft, NT, ND Skin:  no rash or induration seen on limited exam Musculoskeletal:  grossly normal tone BUE/BLE, good ROM, no bony abnormality Psychiatric:  grossly normal mood and affect, speech fluent and appropriate, AOx3 Neurologic:  CN 2-12 grossly intact, moves all extremities in coordinated fashion  Data Reviewed: I have reviewed the patient's lab results since admission.  Pertinent labs for today include:   K+ 3.3, repleted Glucose 178    Condition at discharge: stable  The results of significant diagnostics from this hospitalization (including imaging, microbiology, ancillary and laboratory) are listed below for reference.   Imaging Studies: DG Chest Portable 1 View Result Date: 03/22/2024 CLINICAL DATA:  Lower abdominal pain.  Lethargy. EXAM: PORTABLE  CHEST 1 VIEW COMPARISON:  12/28/2022. FINDINGS: Normal heart, mediastinum and hila. Clear lungs.  No pleural effusion or pneumothorax. Skeletal structures are unremarkable. IMPRESSION: No active disease. Electronically Signed   By: Alm Parkins M.D.   On: 03/22/2024 16:21   CT ABDOMEN PELVIS W CONTRAST Result Date: 03/18/2024 EXAM: CT ABDOMEN AND PELVIS WITH CONTRAST 03/18/2024 03:46:28 PM TECHNIQUE: CT of the abdomen and pelvis was performed with the administration of 100 mL of iohexol  (OMNIPAQUE ) 300 MG/ML solution. Multiplanar reformatted images are provided for review. Automated exposure control, iterative reconstruction, and/or weight-based adjustment of the mA/kV was utilized to reduce the radiation dose to as low as reasonably achievable. COMPARISON: 01/17/2023 CLINICAL HISTORY: Nauseate vomiting after smoking marijuana. FINDINGS: LOWER CHEST: Normal heart size. Clear lung bases. LIVER:  Normal, without mass or intrahepatic biliary duct dilatation. GALLBLADDER AND BILE DUCTS: Gallbladder is unremarkable. No biliary ductal dilatation. SPLEEN: Normal in size and morphology. PANCREAS: Normal, without duct dilatation or acute inflammation. ADRENAL GLANDS: No acute abnormality. KIDNEYS, URETERS AND BLADDER: No renal mass or hydronephrosis. Normal urinary bladder. GI AND BOWEL: Normal small bowel caliber. Tiny hiatal hernia. The transverse and ascending colon appear thick walled, but are underdistended. Example image 39 / 2. Normal terminal ileum and appendix. There is no bowel obstruction. PERITONEUM AND RETROPERITONEUM: No ascites. No free air. VASCULATURE: Aorta is normal in caliber. LYMPH NODES: No lymphadenopathy. REPRODUCTIVE ORGANS: Normal uterus, without adnexal mass. BONES AND SOFT TISSUES: No acute osseous abnormality. No focal soft tissue abnormality. IMPRESSION: 1. No acute explanation for nausea and vomiting after smoking marijuana. 2. Apparent colonic wall thickening may be due to underdistention. Correlate with symptoms of infectious colitis, which could look similar. 3. Tiny hiatal hernia. Electronically signed by: Rockey Kilts MD 03/18/2024 04:41 PM EST RP Workstation: [id number]    Microbiology: Results for orders placed or performed during the hospital encounter of 03/18/24  MRSA Next Gen by PCR, Nasal     Status: None   Collection Time: 03/19/24  1:38 PM   Specimen: Nasal Mucosa; Nasal Swab  Result Value Ref Range Status   MRSA by PCR Next Gen NOT DETECTED NOT DETECTED Final    Comment: (NOTE) The GeneXpert MRSA Assay (FDA approved for NASAL specimens only), is one component of a comprehensive MRSA colonization surveillance program. It is not intended to diagnose MRSA infection nor to guide or monitor treatment for MRSA infections. Test performance is not FDA approved in patients less than 22 years old. Performed at [facility], [address] [place], [place] [address]     Labs: CBC: Recent Labs  Lab 03/31/24 2017 04/01/24 0630  WBC 15.3* 13.9*  NEUTROABS  --  11.3*  HGB 13.5 12.8  HCT 39.5 38.1  MCV 94.3 94.8  PLT 486* 455*   Basic Metabolic Panel: Recent Labs  Lab 03/31/24 2017 04/01/24 1243  NA 135 136  K 3.5 3.3*  CL 99 102  CO2 16* 23  GLUCOSE 214* 178*  BUN 9 7  CREATININE 0.77 0.76  CALCIUM  9.1 9.2   Liver Function Tests: Recent Labs  Lab 03/31/24 2017  AST 28  ALT 18  ALKPHOS 76  BILITOT 0.7  PROT 8.2*  ALBUMIN 4.7   CBG: Recent Labs  Lab 04/01/24 0411 04/01/24 0636 04/01/24 0706 04/01/24 0818 04/01/24 1128  GLUCAP 226* 552* 530* 428* 217*    Discharge time spent: greater than 30 minutes.  Signed: Delon Herald, MD [facility] 04/01/2024

## 2024-04-01 NOTE — Assessment & Plan Note (Signed)
 Cessation encouraged

## 2024-04-01 NOTE — Progress Notes (Signed)
 Patient is resting with eyes closed and is audibly snoring after IV Zofran  and IV Toradol  has been administered. No signs of distress noted or voiced.

## 2024-04-01 NOTE — ED Notes (Signed)
 MD contacted and made aware of pts hyperglycemia.

## 2024-04-01 NOTE — Assessment & Plan Note (Signed)
 Mild, likely reactive in nature No current concern for infection

## 2024-04-01 NOTE — ED Notes (Signed)
 Patient IV infiltrated at this time.

## 2024-04-01 NOTE — Assessment & Plan Note (Signed)
 Last A1c was 6.7, good control Wears insulin  pump at home Concern for developing DKA on presentation but this has resolved Wait to resume insulin  pump until discussion with Dr. Sam In the meantime, Lantus  18 units daily with 3-4 units with meals and sensitive scale SSI is recommended Carb modified diet

## 2024-04-01 NOTE — Hospital Course (Signed)
 23yo with h/o T1DM complicated by gastroparesis and cannabinoid hyperemesis who presented on 12/8 with n/v. Possible developing/mild DKA on presentation.  Now stable, eating, and wants to go home.

## 2024-04-02 ENCOUNTER — Other Ambulatory Visit (HOSPITAL_COMMUNITY): Payer: Self-pay

## 2024-04-02 DIAGNOSIS — F411 Generalized anxiety disorder: Secondary | ICD-10-CM | POA: Diagnosis not present

## 2024-04-02 DIAGNOSIS — D72829 Elevated white blood cell count, unspecified: Secondary | ICD-10-CM | POA: Diagnosis not present

## 2024-04-02 DIAGNOSIS — K219 Gastro-esophageal reflux disease without esophagitis: Secondary | ICD-10-CM | POA: Diagnosis not present

## 2024-04-02 DIAGNOSIS — F1290 Cannabis use, unspecified, uncomplicated: Secondary | ICD-10-CM

## 2024-04-02 DIAGNOSIS — Z8639 Personal history of other endocrine, nutritional and metabolic disease: Secondary | ICD-10-CM | POA: Diagnosis not present

## 2024-04-02 DIAGNOSIS — E1065 Type 1 diabetes mellitus with hyperglycemia: Secondary | ICD-10-CM | POA: Diagnosis not present

## 2024-04-02 DIAGNOSIS — R112 Nausea with vomiting, unspecified: Secondary | ICD-10-CM | POA: Diagnosis not present

## 2024-04-02 LAB — GLUCOSE, CAPILLARY
Glucose-Capillary: 267 mg/dL — ABNORMAL HIGH (ref 70–99)
Glucose-Capillary: 175 mg/dL — ABNORMAL HIGH (ref 70–99)

## 2024-04-02 MED ORDER — INSULIN SYRINGE-NEEDLE U-100 31G X 5/16" 0.3 ML MISC
1.0000 | Freq: Three times a day (TID) | 0 refills | Status: DC
  Filled 2024-04-02: qty 100, 25d supply, fill #0

## 2024-04-02 MED ORDER — METOCLOPRAMIDE HCL 5 MG PO TABS: 5.0000 mg | ORAL_TABLET | Freq: Three times a day (TID) | ORAL | 0 refills | Status: AC

## 2024-04-02 MED ORDER — ONDANSETRON 4 MG PO TBDP: 4.0000 mg | ORAL_TABLET | Freq: Three times a day (TID) | ORAL | 0 refills | Status: AC | PRN

## 2024-04-02 MED ORDER — HYDROXYZINE HCL 10 MG PO TABS
10.0000 mg | ORAL_TABLET | Freq: Once | ORAL | Status: AC | PRN
  Administered 2024-04-02: 10 mg via ORAL
  Filled 2024-04-02: qty 1

## 2024-04-02 MED ORDER — METOCLOPRAMIDE HCL 5 MG PO TABS
5.0000 mg | ORAL_TABLET | Freq: Three times a day (TID) | ORAL | 0 refills | Status: DC
  Filled 2024-04-02: qty 360, 90d supply, fill #0

## 2024-04-02 NOTE — Progress Notes (Signed)
°   04/02/24 0820  TOC Brief Assessment  Insurance and Status Reviewed  Patient has primary care physician Yes  Home environment has been reviewed home with self  Prior level of function: indepednent  Prior/Current Home Services No current home services  Social Drivers of Health Review SDOH reviewed no interventions necessary  Readmission risk has been reviewed Yes  Transition of care needs no transition of care needs at this time

## 2024-04-02 NOTE — Discharge Summary (Signed)
 Physician Discharge Summary  Judy Cook FMW:[id number] DOB: 12-26-2000 DOA: 03/31/2024  PCP: Elnor Lauraine BRAVO, NP  Admit date: 03/31/2024 Discharge date: 04/02/24  Admitted From: Home. Disposition: Home. Recommendations for Outpatient Follow-up:  Outpatient follow-up with PCP/endocrinology in 1 week Reassess glycemic control at follow-up Please follow up on the following pending results: None  Home Health: No need identified Equipment/Devices: No need identified  Discharge Condition: Stable CODE STATUS: Full code   Follow-up Information     Elnor Lauraine BRAVO, NP. Schedule an appointment as soon as possible for a visit in 1 week(s).   Specialty: Nurse Practitioner Contact information: [address] [place] [address] [phone]         Shamleffer, Donell Cardinal, MD. Schedule an appointment as soon as possible for a visit.   Specialties: Endocrinology, Radiology Contact information: [address] [place] [address] [place] [address] [phone]                 Hospital course 23 year old F with PMH of DM-1 with gastroparesis, marijuana use, EtOH use, GERD and anxiety presented to ED with intractable nausea and vomiting and admitted with intractable nausea and vomiting likely due to gastroparesis and cannabinoid hyperemesis.  She was also in mild DKA.  Patient was started on IV fluid hydration and antiemetics.   Patient symptoms improved with IV fluid hydration and antiemetics.  On the day of discharge, DKA resolved.  Hyperglycemia improved.  Nausea and vomiting resolved.  Tolerating regular diet.  Discharged on home insulin  pump.  Renewed her prescription for Reglan .  Encouraged to stop using marijuana.  Patient to follow-up with her endocrinologist as soon as possible.  See individual problem list below for more.   Problems addressed during this hospitalization Intractable nausea and vomiting-likely due to gastroparesis, DKA, EtOH and  marijuana.  Resolved. -Manage diabetes as below -Encouraged alcohol and marijuana cessation. -Continue Reglan  5 mg ACHS -Zofran  ODT as needed  Type 1 diabetes with hyperglycemia and mild DKA: A1c 6.7% on 9/18.  Had significant hyperglycemia with mild AGMA with normal pH on admission.  BHB 1.8.  Acidosis resolved.  Hyperglycemia improved.  On insulin  pump. Recent Labs  Lab 04/01/24 1807 04/01/24 2000 04/01/24 2304 04/02/24 0423 04/02/24 0803  GLUCAP 266* 222* 182* 267* 175*  - Patient to resume home insulin  pump on discharge and follow-up with endocrinology  GERD (gastroesophageal reflux disease) -Continue Relgan  Marijuana use: Encourage cessation.  Alcohol use: No withdrawal symptoms.  Encouraged cessation.  GAD: Stable.  Not on meds.  Hypokalemia: Resolved  Leukocytosis: Likely reactive.  Body mass index is 28.06 kg/m.           Consultations: None  Time spent 35  minutes  Vital signs Vitals:   04/01/24 2002 04/01/24 2306 04/02/24 0425 04/02/24 0432  BP: (!) 161/104 127/80 138/89   Pulse: 87 91 98   Temp: 99.1 F (37.3 C) 98.4 F (36.9 C) 98.9 F (37.2 C)   Resp: 20 18 17    Height:      Weight:    65.2 kg  SpO2: 100% 100% 100%   TempSrc: Oral Oral Oral   BMI (Calculated):    28.06     Discharge exam  GENERAL: No apparent distress.  Nontoxic. HEENT: MMM.  Vision and hearing grossly intact.  NECK: Supple.  No apparent JVD.  RESP:  No IWOB.  Fair aeration bilaterally. CVS:  RRR. Heart sounds normal.  ABD/GI/GU: BS+. Abd soft, NTND.  MSK/EXT:  Moves  extremities. No apparent deformity. No edema.  SKIN: no apparent skin lesion or wound NEURO: Awake and alert. Oriented appropriately.  No apparent focal neuro deficit. PSYCH: Calm. Normal affect.   Discharge Instructions Discharge Instructions     Call MD for:  extreme fatigue   Complete by: As directed    Call MD for:  persistant dizziness or light-headedness   Complete by: As directed    Call  MD for:  persistant nausea and vomiting   Complete by: As directed    Call MD for:  severe uncontrolled pain   Complete by: As directed    Diet Carb Modified   Complete by: As directed    Discharge instructions   Complete by: As directed    1. Wait to resume insulin  pump until discussion with Dr. Sam 2. In the meantime, Lantus  18 units daily with 3-4 units with meals and sensitive scale SSI is recommended 3. Do NOT use marijuana! 4. Follow up with NP Elnor in 1-2 weeks   Increase activity slowly   Complete by: As directed       Allergies as of 04/02/2024   No Known Allergies      Medication List     STOP taking these medications    ibuprofen  800 MG tablet Commonly known as: ADVIL    naproxen sodium 220 MG tablet Commonly known as: ALEVE   sertraline  25 MG tablet Commonly known as: ZOLOFT        TAKE these medications    acetaminophen  325 MG tablet Commonly known as: TYLENOL  Take 2 tablets (650 mg total) by mouth every 6 (six) hours as needed for mild pain (pain score 1-3) (or Fever >/= 101).   ALPRAZolam  0.5 MG tablet Commonly known as: XANAX  Take 1 tablet (0.5 mg total) by mouth 2 (two) times daily as needed for anxiety.   cetirizine  10 MG tablet Commonly known as: ZyrTEC  Allergy Take 1 tablet (10 mg total) by mouth daily. What changed:  when to take this reasons to take this additional instructions   Dexcom G7 Sensor Misc CHANGE EVERY 10 DAYS   Junel  FE 1/20 1-20 MG-MCG tablet Generic drug: norethindrone -ethinyl estradiol -FE Take 1 tablet by mouth daily. What changed: additional instructions   metoCLOPramide  5 MG tablet Commonly known as: Reglan  Take 1 tablet (5 mg total) by mouth 4 (four) times daily -  before meals and at bedtime. What changed: when to take this   multivitamin with minerals tablet Take 2 tablets by mouth daily.   NovoLOG  100 UNIT/ML injection Generic drug: insulin  aspart MAX DAILY 60 UNITS PER PUMP What changed: See  the new instructions.   ondansetron  4 MG disintegrating tablet Commonly known as: ZOFRAN -ODT Take 1 tablet (4 mg total) by mouth every 8 (eight) hours as needed for nausea or vomiting.   Vevye 0.1 % Soln Generic drug: cycloSPORINE Place 1 drop into both eyes in the morning and at bedtime.         Procedures/Studies:   DG Chest Portable 1 View Result Date: 03/22/2024 CLINICAL DATA:  Lower abdominal pain.  Lethargy. EXAM: PORTABLE CHEST 1 VIEW COMPARISON:  12/28/2022. FINDINGS: Normal heart, mediastinum and hila. Clear lungs.  No pleural effusion or pneumothorax. Skeletal structures are unremarkable. IMPRESSION: No active disease. Electronically Signed   By: Alm Parkins M.D.   On: 03/22/2024 16:21   CT ABDOMEN PELVIS W CONTRAST Result Date: 03/18/2024 EXAM: CT ABDOMEN AND PELVIS WITH CONTRAST 03/18/2024 03:46:28 PM TECHNIQUE: CT of the abdomen and pelvis was  performed with the administration of 100 mL of iohexol  (OMNIPAQUE ) 300 MG/ML solution. Multiplanar reformatted images are provided for review. Automated exposure control, iterative reconstruction, and/or weight-based adjustment of the mA/kV was utilized to reduce the radiation dose to as low as reasonably achievable. COMPARISON: 01/17/2023 CLINICAL HISTORY: Nauseate vomiting after smoking marijuana. FINDINGS: LOWER CHEST: Normal heart size. Clear lung bases. LIVER: Normal, without mass or intrahepatic biliary duct dilatation. GALLBLADDER AND BILE DUCTS: Gallbladder is unremarkable. No biliary ductal dilatation. SPLEEN: Normal in size and morphology. PANCREAS: Normal, without duct dilatation or acute inflammation. ADRENAL GLANDS: No acute abnormality. KIDNEYS, URETERS AND BLADDER: No renal mass or hydronephrosis. Normal urinary bladder. GI AND BOWEL: Normal small bowel caliber. Tiny hiatal hernia. The transverse and ascending colon appear thick walled, but are underdistended. Example image 39 / 2. Normal terminal ileum and appendix. There  is no bowel obstruction. PERITONEUM AND RETROPERITONEUM: No ascites. No free air. VASCULATURE: Aorta is normal in caliber. LYMPH NODES: No lymphadenopathy. REPRODUCTIVE ORGANS: Normal uterus, without adnexal mass. BONES AND SOFT TISSUES: No acute osseous abnormality. No focal soft tissue abnormality. IMPRESSION: 1. No acute explanation for nausea and vomiting after smoking marijuana. 2. Apparent colonic wall thickening may be due to underdistention. Correlate with symptoms of infectious colitis, which could look similar. 3. Tiny hiatal hernia. Electronically signed by: Rockey Kilts MD 03/18/2024 04:41 PM EST RP Workstation: [id number]       The results of significant diagnostics from this hospitalization (including imaging, microbiology, ancillary and laboratory) are listed below for reference.     Microbiology: No results found for this or any previous visit (from the past 240 hours).   Labs:  CBC: Recent Labs  Lab 03/31/24 2017 04/01/24 0630  WBC 15.3* 13.9*  NEUTROABS  --  11.3*  HGB 13.5 12.8  HCT 39.5 38.1  MCV 94.3 94.8  PLT 486* 455*   BMP &GFR Recent Labs  Lab 03/31/24 2017 04/01/24 1243  NA 135 136  K 3.5 3.3*  CL 99 102  CO2 16* 23  GLUCOSE 214* 178*  BUN 9 7  CREATININE 0.77 0.76  CALCIUM  9.1 9.2   Estimated Creatinine Clearance: 90.6 mL/min (by C-G formula based on SCr of 0.76 mg/dL). Liver & Pancreas: Recent Labs  Lab 03/31/24 2017  AST 28  ALT 18  ALKPHOS 76  BILITOT 0.7  PROT 8.2*  ALBUMIN 4.7   Recent Labs  Lab 03/31/24 2017  LIPASE 13   No results for input(s): AMMONIA in the last 168 hours. Diabetic: No results for input(s): HGBA1C in the last 72 hours. Recent Labs  Lab 04/01/24 1807 04/01/24 2000 04/01/24 2304 04/02/24 0423 04/02/24 0803  GLUCAP 266* 222* 182* 267* 175*   Cardiac Enzymes: No results for input(s): CKTOTAL, CKMB, CKMBINDEX, TROPONINI in the last 168 hours. No results for input(s): PROBNP in the last  8760 hours. Coagulation Profile: No results for input(s): INR, PROTIME in the last 168 hours. Thyroid  Function Tests: No results for input(s): TSH, T4TOTAL, FREET4, T3FREE, THYROIDAB in the last 72 hours. Lipid Profile: No results for input(s): CHOL, HDL, LDLCALC, TRIG, CHOLHDL, LDLDIRECT in the last 72 hours. Anemia Panel: No results for input(s): VITAMINB12, FOLATE, FERRITIN, TIBC, IRON, RETICCTPCT in the last 72 hours. Urine analysis:    Component Value Date/Time   COLORURINE YELLOW 03/31/2024 0011   APPEARANCEUR CLEAR 03/31/2024 0011   LABSPEC 1.021 03/31/2024 0011   PHURINE 5.0 03/31/2024 0011   GLUCOSEU >=500 (A) 03/31/2024 0011   GLUCOSEU 250 (A) 09/21/2022 1011  HGBUR NEGATIVE 03/31/2024 0011   BILIRUBINUR NEGATIVE 03/31/2024 0011   BILIRUBINUR Negative 08/02/2023 1633   KETONESUR 80 (A) 03/31/2024 0011   PROTEINUR >=300 (A) 03/31/2024 0011   UROBILINOGEN 0.2 08/02/2023 1633   UROBILINOGEN 0.2 09/21/2022 1011   NITRITE NEGATIVE 03/31/2024 0011   LEUKOCYTESUR NEGATIVE 03/31/2024 0011   Sepsis Labs: Invalid input(s): PROCALCITONIN, LACTICIDVEN   SIGNED:  Mignon ONEIDA Bump, MD  [facility] 04/02/2024, 4:02 PM

## 2024-04-07 ENCOUNTER — Ambulatory Visit (HOSPITAL_BASED_OUTPATIENT_CLINIC_OR_DEPARTMENT_OTHER): Payer: Self-pay | Admitting: Family

## 2024-04-07 ENCOUNTER — Encounter (HOSPITAL_COMMUNITY): Payer: Self-pay | Admitting: Family

## 2024-04-07 ENCOUNTER — Other Ambulatory Visit: Payer: Self-pay

## 2024-04-07 VITALS — BP 127/78 | HR 79 | Ht 60.0 in | Wt 150.0 lb

## 2024-04-07 DIAGNOSIS — F19980 Other psychoactive substance use, unspecified with psychoactive substance-induced anxiety disorder: Secondary | ICD-10-CM

## 2024-04-07 DIAGNOSIS — F411 Generalized anxiety disorder: Secondary | ICD-10-CM

## 2024-04-07 MED ORDER — HYDROXYZINE HCL 10 MG PO TABS: 10.0000 mg | ORAL_TABLET | Freq: Three times a day (TID) | ORAL | 0 refills | Status: AC | PRN

## 2024-04-07 MED ORDER — SERTRALINE HCL 50 MG PO TABS: 50.0000 mg | ORAL_TABLET | Freq: Every day | ORAL | 2 refills | Status: AC

## 2024-04-07 MED ORDER — SERTRALINE HCL 50 MG PO TABS: 50.0000 mg | ORAL_TABLET | Freq: Every day | ORAL | 0 refills | Status: DC
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
## 2024-05-02 ENCOUNTER — Ambulatory Visit (HOSPITAL_COMMUNITY)
Admission: RE | Admit: 2024-05-02 | Discharge: 2024-05-02 | Disposition: A | Discharge status: Home / Self Care | Source: Ambulatory Visit | Attending: Internal Medicine

## 2024-05-02 ENCOUNTER — Encounter (HOSPITAL_COMMUNITY): Payer: Self-pay

## 2024-05-02 VITALS — BP 125/81 | HR 77 | Temp 98.4°F | Resp 16

## 2024-05-02 DIAGNOSIS — Z3202 Encounter for pregnancy test, result negative: Secondary | ICD-10-CM | POA: Diagnosis present

## 2024-05-02 DIAGNOSIS — R1030 Lower abdominal pain, unspecified: Secondary | ICD-10-CM | POA: Insufficient documentation

## 2024-05-02 DIAGNOSIS — N760 Acute vaginitis: Secondary | ICD-10-CM | POA: Insufficient documentation

## 2024-05-02 LAB — POCT URINALYSIS DIP (MANUAL ENTRY)
Bilirubin, UA: NEGATIVE
Blood, UA: NEGATIVE
Glucose, UA: NEGATIVE mg/dL
Ketones, POC UA: NEGATIVE mg/dL
Leukocytes, UA: NEGATIVE
Nitrite, UA: NEGATIVE
Protein Ur, POC: 30 mg/dL — AB
Spec Grav, UA: 1.015
Urobilinogen, UA: 0.2 U/dL
pH, UA: 6

## 2024-05-02 LAB — HIV ANTIBODY (ROUTINE TESTING W REFLEX): HIV Screen 4th Generation wRfx: NONREACTIVE

## 2024-05-02 LAB — POCT URINE PREGNANCY: Preg Test, Ur: NEGATIVE

## 2024-05-02 MED ORDER — METRONIDAZOLE 500 MG PO TABS: 500.0000 mg | ORAL_TABLET | Freq: Two times a day (BID) | ORAL | 0 refills | Status: DC

## 2024-05-02 NOTE — Discharge Instructions (Addendum)
 Your evaluation suggests that you likely have bacterial vaginitis.  Your vaginal testing is pending and will come back in the next 2-3 days. You will receive a phone call from staff if you test positive for any other infections. We will go ahead and start treatment for BV. Take antibiotic as prescribed to treat this. If you were given flagyl  pills, do not drink alcohol within 72 hours of taking this medication as this can cause severe nausea and vomiting.   Follow-up with OB/GYN or return to urgent care as needed.

## 2024-05-02 NOTE — ED Triage Notes (Signed)
 Patient here today with lower abd pressure X 2-3 days and had some discomfort with intercourse last night. Patient has taken Metoclopramide  with some relief.

## 2024-05-02 NOTE — ED Provider Notes (Signed)
 " [facility]    CSN: [id number] Arrival date & time: 05/02/24  1450      History   Chief Complaint Chief Complaint  Patient presents with   SEXUALLY TRANSMITTED DISEASE    Requests testing for STDs, & STIs. - Entered by patient    HPI Judy Cook is a 24 y.o. female.   Judy Cook is a 24 y.o. female presenting for chief complaint of bilateral lower pelvic pressure and vaginal odor that started 2 to 3 days ago.  No vaginal discharge or vaginal itching.  Recently sexually active with condom with new female partner last night, states she had a little bit of pain with penetrative intercourse last night.  No known exposures to STDs.  Lubricant was used during intercourse.  She denies vaginal rash, nausea, vomiting, diarrhea, low back pain, flank pain, dizziness, fever/chills, and recent antibiotic or steroid use.  She is a type I diabetic, states her sugars have been within normal limits via Dexcom continuous blood glucose monitor.  No history of PID.  She has not attempted use of any over-the-counter medications to help with symptoms prior to arrival. Last menstrual cycle started on April 15, 2024.  She takes oral contraceptives but has missed some of her doses and is concerned that she may be pregnant.  Requesting pregnancy test today.     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   Erythrocytosis 03/22/2024   Cannabinoid hyperemesis syndrome  03/22/2024   Hypophosphatemia 03/22/2024   Cannabis abuse 03/22/2024   GAD (generalized anxiety disorder) 01/04/2024   Polycystic ovary syndrome 08/15/2023   Generalized abdominal pain 07/23/2023   Proteinuria 07/23/2023   Screening examination for STD (sexually transmitted disease) 05/04/2023   Polysubstance use disorder 02/01/2023   Intractable nausea and vomiting 12/28/2022   Diabetic gastroparesis (HCC) 12/28/2022   GERD with esophagitis 12/28/2022   AKI (acute kidney injury) 12/28/2022   Thrombocytosis 12/28/2022   Insulin  pump titration 11/20/2022   Stress diabetes 11/20/2022   Type 1 diabetes mellitus with hyperglycemia (HCC) 11/20/2022   Leukocytosis 10/14/2022   GERD (gastroesophageal reflux disease) 10/14/2022   Colitis 09/21/2022   History of type 1 diabetes mellitus 09/04/2022   RhD negative 07/05/2022   Constipation 07/07/2021   Marijuana use 02/20/2019   Cyclical vomiting 02/20/2019   Hypokalemia 02/20/2019   Dehydration 07/16/2018    Past Surgical History:  Procedure Laterality Date   DILATION AND EVACUATION N/A 07/05/2022   Procedure: DILATATION AND EVACUATION;  Surgeon: Izell Harari, MD;  [facility];  Service: Gynecology;  Laterality: N/A;   OPERATIVE ULTRASOUND N/A 07/05/2022   Procedure: OPERATIVE ULTRASOUND;  Surgeon: Izell Harari, MD;  Location: [facility];  Service: Gynecology;  Laterality: N/A;    OB History     Gravida  1   Para  0   Term  0   Preterm  0   AB  1   Living  0  SAB  0   IAB  1   Ectopic  0   Multiple  0   Live Births  0            Home Medications    Prior to Admission medications  Medication Sig Start Date End Date Taking? Authorizing Provider  metroNIDAZOLE  (FLAGYL ) 500 MG tablet Take 1 tablet (500 mg total) by mouth 2 (two) times daily. 05/02/24  Yes Enedelia Dorna HERO, FNP  acetaminophen  (TYLENOL ) 325 MG tablet Take 2 tablets (650 mg total) by mouth every 6 (six) hours as needed for mild  pain (pain score 1-3) (or Fever >/= 101). 04/01/24   Barbarann Nest, MD  ALPRAZolam  (XANAX ) 0.5 MG tablet Take 1 tablet (0.5 mg total) by mouth 2 (two) times daily as needed for anxiety. 03/21/24   Arlon Carliss ORN, DO  cetirizine  (ZYRTEC  ALLERGY) 10 MG tablet Take 1 tablet (10 mg total) by mouth daily. Patient taking differently: Take 10 mg by mouth daily as needed for allergies. 03/24/24: Reports during TOC call, takes prn 07/25/23   Christopher Savannah, PA-C  Continuous Glucose Sensor (DEXCOM G7 SENSOR) MISC CHANGE EVERY 10 DAYS 11/05/23   Shamleffer, Ibtehal Jaralla, MD  hydrOXYzine  (ATARAX ) 10 MG tablet Take 1 tablet (10 mg total) by mouth 3 (three) times daily as needed. 04/07/24   Ezzard Staci SAILOR, NP  JUNEL  FE 1/20 1-20 MG-MCG tablet Take 1 tablet by mouth daily. Patient taking differently: Take 1 tablet by mouth daily. 03/24/24: reports during [facility] call, often forgets to take consistently 03/18/24   [provider]  metoCLOPramide  (REGLAN ) 5 MG tablet Take 1 tablet (5 mg total) by mouth 4 (four) times daily -  before meals and at bedtime. 04/02/24 07/01/24  Gonfa, Taye T, MD  Multiple Vitamins-Minerals (MULTIVITAMIN WITH MINERALS) tablet Take 2 tablets by mouth daily.    [provider]  NOVOLOG  100 UNIT/ML injection MAX DAILY 60 UNITS PER PUMP Patient taking differently: Inject 45 Units into the skin continuous. 07/10/23   Shamleffer, Ibtehal Jaralla, MD  ondansetron  (ZOFRAN -ODT) 4 MG disintegrating tablet Take 1 tablet (4 mg total) by mouth every 8 (eight) hours as needed for nausea or vomiting. 04/02/24   Gonfa, Taye T, MD  sertraline  (ZOLOFT ) 50 MG tablet Take 1 tablet (50 mg total) by mouth daily. 04/07/24 04/07/25  Ezzard Staci SAILOR, NP  VEVYE 0.1 % SOLN Place 1 drop into both eyes in the morning and at bedtime. 02/01/24   [provider]    Family History Family History  Problem Relation Age of Onset   Diabetes Paternal Grandfather    Heart attack Paternal Grandfather     Diabetes Paternal Grandmother    Cancer Maternal Grandmother        lung   Alcohol abuse Maternal Grandfather    Alcohol abuse Father    Drug abuse Father    High Cholesterol Father    Alcohol abuse Mother    Depression Mother    Drug abuse Mother     Social History Social History[1]   Allergies   Patient has no known allergies.   Review of Systems Review of Systems Per HPI  Physical Exam Triage Vital Signs ED Triage Vitals  Encounter Vitals Group     BP 05/02/24 1544 125/81     Girls Systolic BP Percentile --      Girls Diastolic BP Percentile --      Boys Systolic BP Percentile --      Boys Diastolic BP  Percentile --      Pulse Rate 05/02/24 1544 77     Resp 05/02/24 1544 16     Temp 05/02/24 1544 98.4 F (36.9 C)     Temp Source 05/02/24 1544 Oral     SpO2 05/02/24 1544 95 %     Weight --      Height --      Head Circumference --      Peak Flow --      Pain Score 05/02/24 1539 4     Pain Loc --      Pain Education --      Exclude from Growth Chart --    No data found.  Updated Vital Signs BP 125/81 (BP Location: Left Arm)   Pulse 77   Temp 98.4 F (36.9 C) (Oral)   Resp 16   LMP 04/15/2024 (Approximate)   SpO2 95%   Visual Acuity Right Eye Distance:   Left Eye Distance:   Bilateral Distance:    Right Eye Near:   Left Eye Near:    Bilateral Near:     Physical Exam Vitals and nursing note reviewed.  Constitutional:      Appearance: She is not ill-appearing or toxic-appearing.  HENT:     Head: Normocephalic and atraumatic.     Right Ear: Hearing and external ear normal.     Left Ear: Hearing and external ear normal.     Nose: Nose normal.     Mouth/Throat:     Lips: Pink.  Eyes:     General: Lids are normal. Vision grossly intact. Gaze aligned appropriately.     Extraocular Movements: Extraocular movements intact.     Conjunctiva/sclera: Conjunctivae normal.  Pulmonary:     Effort: Pulmonary effort is normal.  Musculoskeletal:      Cervical back: Neck supple.  Skin:    General: Skin is warm and dry.     Capillary Refill: Capillary refill takes less than 2 seconds.     Findings: No rash.  Neurological:     General: No focal deficit present.     Mental Status: She is alert and oriented to person, place, and time. Mental status is at baseline.     Cranial Nerves: No dysarthria or facial asymmetry.  Psychiatric:        Mood and Affect: Mood normal.        Speech: Speech normal.        Behavior: Behavior normal.        Thought Content: Thought content normal.        Judgment: Judgment normal.      UC Treatments / Results  Labs (all labs ordered are listed, but only abnormal results are displayed) Labs Reviewed  POCT URINALYSIS DIP (MANUAL ENTRY) - Abnormal; Notable for the following components:      Result Value   Protein Ur, POC =30 (*)    All other components within normal limits  HIV ANTIBODY (ROUTINE TESTING W REFLEX)  SYPHILIS: RPR W/REFLEX TO RPR TITER AND TREPONEMAL ANTIBODIES, TRADITIONAL SCREENING AND DIAGNOSIS ALGORITHM  POCT URINE PREGNANCY  CERVICOVAGINAL ANCILLARY ONLY    EKG   Radiology No results found.  Procedures Procedures (including critical care time)  Medications Ordered in UC Medications - No data to display  Initial Impression / Assessment and Plan / UC Course  I have reviewed the triage vital signs and the nursing notes.  Pertinent labs & imaging results that were available during my care of the patient  were reviewed by me and considered in my medical decision making (see chart for details).   1.  Lower abdominal pain, acute vaginitis Presentation suspicious for BV.  Empiric treatment for BV with Flagyl  ordered (discussed risks of disulfiram reaction with oral Flagyl ). Vaginal swab pending, will treat for other infections based on swab results.  Discussed tips for BV prevention, encouraged OB/GYN follow-up should recurrent infections occur or symptoms fail to improve.     Urinalysis is unremarkable for signs of UTI, negative for ketones. Urine pregnancy test is negative.  Counseled patient on potential for adverse effects with medications prescribed/recommended today, strict ER and return-to-clinic precautions discussed, patient verbalized understanding.    Final Clinical Impressions(s) / UC Diagnoses   Final diagnoses:  Lower abdominal pain  Acute vaginitis  Urine pregnancy test negative     Discharge Instructions      Your evaluation suggests that you likely have bacterial vaginitis.  Your vaginal testing is pending and will come back in the next 2-3 days. You will receive a phone call from staff if you test positive for any other infections. We will go ahead and start treatment for BV. Take antibiotic as prescribed to treat this. If you were given flagyl  pills, do not drink alcohol within 72 hours of taking this medication as this can cause severe nausea and vomiting.   Follow-up with OB/GYN or return to urgent care as needed.       ED Prescriptions     Medication Sig Dispense Auth. Provider   metroNIDAZOLE  (FLAGYL ) 500 MG tablet Take 1 tablet (500 mg total) by mouth 2 (two) times daily. 14 tablet Enedelia Dorna HERO, FNP      PDMP not reviewed this encounter.     [1]  Social History Tobacco Use   Smoking status: Former    Types: Cigarettes   Smokeless tobacco: Never  Vaping Use   Vaping status: Every Day   Substances: Nicotine , THC  Substance Use Topics   Alcohol use: Yes    Comment: socially   Drug use: Yes    Types: Marijuana     Enedelia Dorna HERO, FNP 05/02/24 1651  "

## 2024-05-03 LAB — SYPHILIS: RPR W/REFLEX TO RPR TITER AND TREPONEMAL ANTIBODIES, TRADITIONAL SCREENING AND DIAGNOSIS ALGORITHM: RPR Ser Ql: NONREACTIVE

## 2024-05-05 ENCOUNTER — Ambulatory Visit (HOSPITAL_COMMUNITY): Payer: Self-pay

## 2024-05-05 LAB — CERVICOVAGINAL ANCILLARY ONLY
Bacterial Vaginitis (gardnerella): POSITIVE — AB
Candida Glabrata: NEGATIVE
Candida Vaginitis: POSITIVE — AB
Chlamydia: NEGATIVE
Comment: NEGATIVE
Comment: NEGATIVE
Comment: NEGATIVE
Comment: NEGATIVE
Comment: NEGATIVE
Comment: NORMAL
Neisseria Gonorrhea: NEGATIVE
Trichomonas: NEGATIVE

## 2024-05-05 MED ORDER — FLUCONAZOLE 150 MG PO TABS: 150.0000 mg | ORAL_TABLET | Freq: Once | ORAL | 0 refills | Status: AC

## 2024-05-12 ENCOUNTER — Ambulatory Visit (INDEPENDENT_AMBULATORY_CARE_PROVIDER_SITE_OTHER): Admitting: Internal Medicine

## 2024-05-12 ENCOUNTER — Encounter: Payer: Self-pay | Admitting: Internal Medicine

## 2024-05-12 VITALS — BP 132/80 | Ht 60.0 in | Wt 150.0 lb

## 2024-05-12 DIAGNOSIS — E1065 Type 1 diabetes mellitus with hyperglycemia: Secondary | ICD-10-CM

## 2024-05-12 DIAGNOSIS — E282 Polycystic ovarian syndrome: Secondary | ICD-10-CM

## 2024-05-12 LAB — POCT GLYCOSYLATED HEMOGLOBIN (HGB A1C): Hemoglobin A1C: 7.1 % — AB (ref 4.0–5.6)

## 2024-05-12 MED ORDER — GVOKE HYPOPEN 1-PACK 1 MG/0.2ML ~~LOC~~ SOAJ: 1.0000 mg | SUBCUTANEOUS | 1 refills | Status: AC

## 2024-05-12 NOTE — Progress Notes (Signed)
 "     Name: Judy Cook  MRN/ DOB: [id number], [birth year]   Age/ Sex: 24 y.o., female    PCP: Elnor Lauraine BRAVO, NP   Reason for Endocrinology Evaluation: Type 1 Diabetes Mellitus     Date of Initial Endocrinology Visit: 07/26/2021    PATIENT IDENTIFIER: Judy Cook is a 24 y.o. female with a past medical history of T1DM. The patient presented for initial endocrinology clinic visit on 07/26/2021  for consultative assistance with her diabetes management.    HPI: Ms. Steinhilber was    Diagnosed with DM at age 25               Hemoglobin A1c has ranged from 10.7% in 2023, peaking at 12% in 2021. Patient required assistance for hypoglycemia: when she was young Patient has required hospitalization within the last 1 year from hyper or hypoglycemia: Patient with history of DKA but not in 2022  On her initial visit to our clinic she had an A1c of 10.7%, she was referred for pump training on the Tandem 10/2021    PCOS: She was evaluated by GYN for pelvic pain 07/2023 .  Pelvic ultrasound consistent with polycystic ovaries, she also endorsed hyper androgenic symptoms with acne and hirsutism. She was put on COC's through GYN office  SUBJECTIVE:   During the last visit (01/10/2024): A1c 6.7% .     Today (05/12/24): Judy Cook is here for a follow up on diabetes management. She checks her blood sugars multiple  times daily through CGM. The patient has  had hypoglycemic episodes since the last clinic visit.   Since her last visit here she has had multiple ED visits for variable reasons including hypoglycemia in November, this was following taking extra insulin  in addition on top of what her pump provided because she was eating cupcakes at a birthday party, she was driving home and felt weak and nauseous BG when EMS arrived was in the 70s, by the time she arrived to the ED it was in the 60s.  She presented again in November with nausea and vomiting, with  early DKA and marijuana hyperemesis.  BG was controlled  She had moved to the [employer]  Has had occasional nausea  No constipation  No diarrhea  On COC's   This patient with type 1 diabetes is treated with Tandem  (insulin  pump). During the visit the pump basal and bolus doses were reviewed including carb/insulin  rations and supplemental doses. The clinical list was updated. The glucose meter download was reviewed in detail to determine if the current pump settings are providing the best glycemic control without excessive hypoglycemia.  Pump and CGM download:       Pump   Tandem  Settings   Insulin  type   Novolog     Basal rate       0000 0.8 u/h    0800 1.00          I:C ratio       0000 1:1    #8 g with a meal,# 4 g with a snack              Sensitivity       0000  45      Goal       0000  120          Type & Model of Pump: Tandem  Insulin  Type: Currently using NovoLog .  Body mass index is 29.29 kg/m.  PUMP STATISTICS:  HOME DIABETES REGIMEN:  Novolog     Statin: no ACE-I/ARB: no Prior Diabetic Education: yes     DIABETIC COMPLICATIONS: Microvascular complications:   Denies: CKD, retinopathy, neuropathy  Last eye exam: Completed years   Macrovascular complications:   Denies: CAD, PVD, CVA   PAST HISTORY: Past Medical History:  Past Medical History:  Diagnosis Date   Allergic rhinitis 07/07/2021   Cannabinoid hyperemesis syndrome    COVID 2020   mild   Diabetes mellitus (HCC)    Type 1, type 5   Dysmenorrhea 02/20/2019   GERD (gastroesophageal reflux disease)    Headache    History of herpes genitalis 06/24/2021   Intractable vomiting    SIRS (systemic inflammatory response syndrome) (HCC) 12/03/2019   Past Surgical History:  Past Surgical History:  Procedure Laterality Date   DILATION AND EVACUATION N/A 07/05/2022   Procedure: DILATATION AND EVACUATION;  Surgeon: Izell Harari,  MD;  Location: [facility];  Service: Gynecology;  Laterality: N/A;   OPERATIVE ULTRASOUND N/A 07/05/2022   Procedure: OPERATIVE ULTRASOUND;  Surgeon: Izell Harari, MD;  Location: [facility];  Service: Gynecology;  Laterality: N/A;    Social History:  reports that she has quit smoking. Her smoking use included cigarettes. She has never used smokeless tobacco. She reports current alcohol use. She reports current drug use. Drug: Marijuana. Family History:  Family History  Problem Relation Age of Onset   Diabetes Paternal Grandfather    Heart attack Paternal Grandfather    Diabetes Paternal Grandmother    Cancer Maternal Grandmother        lung   Alcohol abuse Maternal Grandfather    Alcohol abuse Father    Drug abuse Father    High Cholesterol Father    Alcohol abuse Mother    Depression Mother    Drug abuse Mother      HOME MEDICATIONS: Allergies as of 05/12/2024   No Known Allergies      Medication List        Accurate as of May 12, 2024 10:42 AM. If you have any questions, ask your nurse or doctor.          STOP taking these medications    metroNIDAZOLE  500 MG tablet Commonly known as: FLAGYL  Stopped by: Donell Butts, MD       TAKE these medications    acetaminophen  325 MG tablet Commonly known as: TYLENOL  Take 2 tablets (650 mg total) by mouth every 6 (six) hours as needed for mild pain (pain score 1-3) (or Fever >/= 101).   ALPRAZolam  0.5 MG tablet Commonly known as: XANAX  Take 1 tablet (0.5 mg total) by mouth 2 (two) times daily as needed for anxiety.   cetirizine  10 MG tablet Commonly known as: ZyrTEC  Allergy Take 1 tablet (10 mg total) by mouth daily. What changed:  when to take this reasons to take this additional instructions   Dexcom G7 Sensor Misc CHANGE EVERY 10 DAYS   hydrOXYzine  10 MG tablet Commonly known as: ATARAX  Take 1 tablet (10 mg total) by mouth 3 (three) times daily as needed.   Junel  FE 1/20 1-20 MG-MCG tablet Generic  drug: norethindrone -ethinyl estradiol -FE Take 1 tablet by mouth daily. What changed: additional instructions   metoCLOPramide  5 MG tablet Commonly known as: Reglan  Take 1 tablet (5 mg total) by mouth 4 (four) times daily -  before meals and at bedtime.   multivitamin with minerals tablet Take 2 tablets by mouth daily.   NovoLOG  100 UNIT/ML injection Generic drug: insulin   aspart MAX DAILY 60 UNITS PER PUMP What changed: See the new instructions.   ondansetron  4 MG disintegrating tablet Commonly known as: ZOFRAN -ODT Take 1 tablet (4 mg total) by mouth every 8 (eight) hours as needed for nausea or vomiting.   sertraline  50 MG tablet Commonly known as: Zoloft  Take 1 tablet (50 mg total) by mouth daily.   T:slim X2 Insulin  Pump Devi by Does not apply route.   Vevye 0.1 % Soln Generic drug: cycloSPORINE Place 1 drop into both eyes in the morning and at bedtime.         ALLERGIES: No Known Allergies   REVIEW OF SYSTEMS: A comprehensive ROS was conducted with the patient and is negative except as per HPI and below:      OBJECTIVE:   VITAL SIGNS: BP 132/80   Ht 5' (1.524 m)   Wt 150 lb (68 kg)   LMP 04/15/2024 (Approximate)   BMI 29.29 kg/m    PHYSICAL EXAM:    Exam: General: Pt appears well and is in NAD  Lungs: Clear with good BS bilat   Heart: RRR   Extremities: No pretibial edema.   Neuro: MS is good with appropriate affect, pt is alert and Ox3    DM foot exam: 01/10/2024  The skin of the feet is intact without sores or ulcerations. The pedal pulses are 2+ on right and 2+ on left. The sensation is intact to a screening 5.07, 10 gram monofilament bilaterally    DATA REVIEWED:  Lab Results  Component Value Date   HGBA1C 6.7 (A) 01/10/2024   HGBA1C 6.7 (A) 08/15/2023   HGBA1C 8.2 (A) 04/13/2023    Latest Reference Range & Units 01/10/24 10:34  TSH mIU/L 0.54  T4,Free(Direct) 0.8 - 1.8 ng/dL 1.1      Latest Reference Range & Units 01/10/24  10:34  Total CHOL/HDL Ratio <5.0 (calc) 3.0  Cholesterol <200 mg/dL 821  HDL Cholesterol > OR = 50 mg/dL 59  LDL Cholesterol (Calc) mg/dL (calc) 894 (H)  MICROALB/CREAT RATIO <30 mg/g creat 107 (H)  Non-HDL Cholesterol (Calc) <130 mg/dL (calc) 880  Triglycerides <150 mg/dL 59  (H): Data is abnormally high   Latest Reference Range & Units 04/01/24 12:43  Sodium 135 - 145 mmol/L 136  Potassium 3.5 - 5.1 mmol/L 3.3 (L)  Chloride 98 - 111 mmol/L 102  CO2 22 - 32 mmol/L 23  Glucose 70 - 99 mg/dL 821 (H)  BUN 6 - 20 mg/dL 7  Creatinine 9.55 - 8.99 mg/dL 9.23  Calcium  8.9 - 10.3 mg/dL 9.2  Anion gap 5 - 15  11  GFR, Estimated >60 mL/min >60   ASSESSMENT / PLAN / RECOMMENDATIONS:   1) Type 1 Diabetes Mellitus, Optimally controlled, With Microalbuminuria complications - Most recent A1c of 7.1%. Goal A1c <7.0%.    - A1c slightly elevated -The patient did not do well with carb counting, and has done so much better with a standing dose of grams of carbohydrates, but unfortunately she continues to haphazardly enter variable numbers of grams of carbohydrates consumed which has resulted in hypoglycemia as well as severe hyperglycemia -Patient was advised to enter 4 g of carbohydrates with a meal and 2 g of carbohydrates with a snack -She unfortunately continues to override the pump which has resulted in hypoglycemia, we again discouraged overriding the pump to prevent glycemic excursions - No changes to pump settings at this time - A prescription for Gvoke  has been sent   Pump  Tandem  Settings   Insulin  type   Novolog     Basal rate       0000 0.8 u/h    0800 1.00          I:C ratio       0000 1:1    #4 g with a meal,# 2 g with a snack              Sensitivity       0000  45      Goal       0000  120          MEDICATIONS:   Novolog     EDUCATION / INSTRUCTIONS: BG monitoring instructions: Patient is instructed to check her blood sugars 3  x daily before meals   Call  [facility] if: BG persistently < 70  I reviewed the Rule of 15 for the treatment of hypoglycemia in detail with the patient. Literature supplied.   2) Diabetic complications:  Eye: Does not have known diabetic retinopathy.  Neuro/ Feet: Does not have known diabetic peripheral neuropathy. Renal: Patient does not have known baseline CKD. She is not on an ACEI/ARB at present.    3) Lipids: No indication for statin therapy at this time   4) PCOS:  -She was diagnosed with this through pelvic ultrasound 07/2023, and hyper androgenic symptoms - She is on OCPs through GYN has been consistent - The management of PCOS involves amelioration of hyperandrogenic symptoms, metabolic abnormalities , prevention of endometrial hyperplasia and carcinoma as well as contraception for those not pursuing pregnancy.   5) Dyslipidemia:   -We encouraged low-fat diet   6) Microalbuminuria  -MA/CR ratio elevated, will encourage optimizing glucose control - Will continue to monitor, if this persists will consider ACE inhibitor or ARB    Patient will be moving to the [place] area, I have encouraged her to establish with a PCP, and from there there we will refer her to the appropriate endocrinology practice     In addition to time spent performing glucose monitor, I spent 25 minutes preparing to see the patient by review of recent labs, imaging and procedures, obtaining and reviewing separately obtained history, communicating with the patient/family or caregiver, ordering medications, tests or procedures, and documenting clinical information in the EHR including the differential Dx, treatment, and any further evaluation and other management   Signed electronically by: Stefano Redgie Butts, MD  [facility]  [facility] [address] [place] [place], [place] [address] Phone: [phone] FAX: [phone]   CC: Elnor Lauraine BRAVO, NP [address] [place] [address] Phone: [phone]  Fax: [phone]    Return to Endocrinology clinic as below: Future Appointments  Date Time Provider Department Center  07/07/2024  2:00 PM Ezzard Staci SAILOR, NP [facility] None  08/15/2024 10:20 AM Elnor Lauraine BRAVO, NP [id number]    "

## 2024-05-12 NOTE — Patient Instructions (Signed)
 Entered between 4 g of carbohydrates with each meal and 2 g with a snack  HOW TO TREAT LOW BLOOD SUGARS (Blood sugar LESS THAN 70 MG/DL) Please follow the RULE OF 15 for the treatment of hypoglycemia treatment (when your (blood sugars are less than 70 mg/dL)   STEP 1: Take 15 grams of carbohydrates when your blood sugar is low, which includes:  3-4 GLUCOSE TABS  OR 3-4 OZ OF JUICE OR REGULAR SODA OR ONE TUBE OF GLUCOSE GEL    STEP 2: RECHECK blood sugar in 15 MINUTES STEP 3: If your blood sugar is still low at the 15 minute recheck --> then, go back to STEP 1 and treat AGAIN with another 15 grams of carbohydrates.

## 2024-05-24 ENCOUNTER — Other Ambulatory Visit: Payer: Self-pay

## 2024-05-24 ENCOUNTER — Observation Stay (HOSPITAL_COMMUNITY)
Admission: EM | Admit: 2024-05-24 | Discharge: 2024-05-29 | DRG: 074 | Disposition: A | Attending: Family Medicine | Admitting: Family Medicine

## 2024-05-24 DIAGNOSIS — Z83438 Family history of other disorder of lipoprotein metabolism and other lipidemia: Secondary | ICD-10-CM

## 2024-05-24 DIAGNOSIS — E876 Hypokalemia: Secondary | ICD-10-CM | POA: Diagnosis present

## 2024-05-24 DIAGNOSIS — Z794 Long term (current) use of insulin: Secondary | ICD-10-CM

## 2024-05-24 DIAGNOSIS — K219 Gastro-esophageal reflux disease without esophagitis: Secondary | ICD-10-CM | POA: Diagnosis present

## 2024-05-24 DIAGNOSIS — R6883 Chills (without fever): Secondary | ICD-10-CM | POA: Diagnosis present

## 2024-05-24 DIAGNOSIS — E1010 Type 1 diabetes mellitus with ketoacidosis without coma: Secondary | ICD-10-CM | POA: Diagnosis present

## 2024-05-24 DIAGNOSIS — E663 Overweight: Secondary | ICD-10-CM | POA: Diagnosis present

## 2024-05-24 DIAGNOSIS — R3915 Urgency of urination: Secondary | ICD-10-CM | POA: Diagnosis present

## 2024-05-24 DIAGNOSIS — Z818 Family history of other mental and behavioral disorders: Secondary | ICD-10-CM

## 2024-05-24 DIAGNOSIS — R1116 Cannabis hyperemesis syndrome: Principal | ICD-10-CM | POA: Diagnosis present

## 2024-05-24 DIAGNOSIS — F1410 Cocaine abuse, uncomplicated: Secondary | ICD-10-CM | POA: Diagnosis present

## 2024-05-24 DIAGNOSIS — R066 Hiccough: Secondary | ICD-10-CM | POA: Diagnosis present

## 2024-05-24 DIAGNOSIS — F32A Depression, unspecified: Secondary | ICD-10-CM | POA: Diagnosis present

## 2024-05-24 DIAGNOSIS — F419 Anxiety disorder, unspecified: Secondary | ICD-10-CM | POA: Diagnosis present

## 2024-05-24 DIAGNOSIS — E871 Hypo-osmolality and hyponatremia: Secondary | ICD-10-CM | POA: Diagnosis present

## 2024-05-24 DIAGNOSIS — K3184 Gastroparesis: Secondary | ICD-10-CM | POA: Diagnosis present

## 2024-05-24 DIAGNOSIS — E1143 Type 2 diabetes mellitus with diabetic autonomic (poly)neuropathy: Secondary | ICD-10-CM

## 2024-05-24 DIAGNOSIS — Z8616 Personal history of COVID-19: Secondary | ICD-10-CM

## 2024-05-24 DIAGNOSIS — E282 Polycystic ovarian syndrome: Secondary | ICD-10-CM | POA: Diagnosis present

## 2024-05-24 DIAGNOSIS — E1043 Type 1 diabetes mellitus with diabetic autonomic (poly)neuropathy: Principal | ICD-10-CM | POA: Diagnosis present

## 2024-05-24 DIAGNOSIS — Z9641 Presence of insulin pump (external) (internal): Secondary | ICD-10-CM | POA: Diagnosis present

## 2024-05-24 DIAGNOSIS — Z87891 Personal history of nicotine dependence: Secondary | ICD-10-CM

## 2024-05-24 DIAGNOSIS — Z8249 Family history of ischemic heart disease and other diseases of the circulatory system: Secondary | ICD-10-CM

## 2024-05-24 DIAGNOSIS — Z833 Family history of diabetes mellitus: Secondary | ICD-10-CM

## 2024-05-24 DIAGNOSIS — Z811 Family history of alcohol abuse and dependence: Secondary | ICD-10-CM

## 2024-05-24 DIAGNOSIS — Z6828 Body mass index (BMI) 28.0-28.9, adult: Secondary | ICD-10-CM

## 2024-05-24 DIAGNOSIS — R1110 Vomiting, unspecified: Secondary | ICD-10-CM | POA: Diagnosis present

## 2024-05-24 DIAGNOSIS — E10649 Type 1 diabetes mellitus with hypoglycemia without coma: Secondary | ICD-10-CM | POA: Diagnosis present

## 2024-05-24 DIAGNOSIS — F1210 Cannabis abuse, uncomplicated: Secondary | ICD-10-CM | POA: Diagnosis present

## 2024-05-24 DIAGNOSIS — Z79899 Other long term (current) drug therapy: Secondary | ICD-10-CM

## 2024-05-24 DIAGNOSIS — Z813 Family history of other psychoactive substance abuse and dependence: Secondary | ICD-10-CM

## 2024-05-24 LAB — CBG MONITORING, ED
Glucose-Capillary: 278 mg/dL — ABNORMAL HIGH (ref 70–99)
Glucose-Capillary: 223 mg/dL — ABNORMAL HIGH (ref 70–99)

## 2024-05-24 LAB — CBC WITH DIFFERENTIAL/PLATELET
Abs Immature Granulocytes: 0.04 10*3/uL (ref 0.00–0.07)
Basophils Absolute: 0 10*3/uL (ref 0.0–0.1)
Basophils Relative: 0 %
Eosinophils Absolute: 0.1 10*3/uL (ref 0.0–0.5)
Eosinophils Relative: 1 %
HCT: 43.2 % (ref 36.0–46.0)
Hemoglobin: 14.7 g/dL (ref 12.0–15.0)
Immature Granulocytes: 0 %
Lymphocytes Relative: 7 %
Lymphs Abs: 0.7 10*3/uL (ref 0.7–4.0)
MCH: 31.5 pg (ref 26.0–34.0)
MCHC: 34 g/dL (ref 30.0–36.0)
MCV: 92.5 fL (ref 80.0–100.0)
Monocytes Absolute: 0.5 10*3/uL (ref 0.1–1.0)
Monocytes Relative: 5 %
Neutro Abs: 8.8 10*3/uL — ABNORMAL HIGH (ref 1.7–7.7)
Neutrophils Relative %: 87 %
Platelets: 427 10*3/uL — ABNORMAL HIGH (ref 150–400)
RBC: 4.67 MIL/uL (ref 3.87–5.11)
RDW: 12 % (ref 11.5–15.5)
WBC: 10.1 10*3/uL (ref 4.0–10.5)
nRBC: 0 % (ref 0.0–0.2)

## 2024-05-24 LAB — URINALYSIS, ROUTINE W REFLEX MICROSCOPIC
Bacteria, UA: NONE SEEN
Bilirubin Urine: NEGATIVE
Glucose, UA: >=500 mg/dL — AB
Hgb urine dipstick: NEGATIVE
Ketones, ur: 20 mg/dL — AB
Leukocytes,Ua: NEGATIVE
Nitrite: NEGATIVE
Protein, ur: 30 mg/dL — AB
Specific Gravity, Urine: 1.022 (ref 1.005–1.030)
pH: 7 (ref 5.0–8.0)

## 2024-05-24 LAB — URINE DRUG SCREEN
Amphetamines: NEGATIVE
Barbiturates: NEGATIVE
Benzodiazepines: NEGATIVE
Cocaine: POSITIVE — AB
Fentanyl: NEGATIVE
Methadone Scn, Ur: NEGATIVE
Opiates: NEGATIVE
Tetrahydrocannabinol: POSITIVE — AB

## 2024-05-24 LAB — COMPREHENSIVE METABOLIC PANEL WITH GFR
ALT: 21 U/L (ref 0–44)
AST: 28 U/L (ref 15–41)
Albumin: 4.8 g/dL (ref 3.5–5.0)
Alkaline Phosphatase: 88 U/L (ref 38–126)
Anion gap: 17 — ABNORMAL HIGH (ref 5–15)
BUN: 9 mg/dL (ref 6–20)
CO2: 22 mmol/L (ref 22–32)
Calcium: 10.4 mg/dL — ABNORMAL HIGH (ref 8.9–10.3)
Chloride: 98 mmol/L (ref 98–111)
Creatinine, Ser: 0.92 mg/dL (ref 0.44–1.00)
GFR, Estimated: >60 mL/min
Glucose, Bld: 330 mg/dL — ABNORMAL HIGH (ref 70–99)
Potassium: 3.6 mmol/L (ref 3.5–5.1)
Sodium: 136 mmol/L (ref 135–145)
Total Bilirubin: 0.5 mg/dL (ref 0.0–1.2)
Total Protein: 8.5 g/dL — ABNORMAL HIGH (ref 6.5–8.1)

## 2024-05-24 LAB — BASIC METABOLIC PANEL WITH GFR
Anion gap: 14 (ref 5–15)
Anion gap: 19 — ABNORMAL HIGH (ref 5–15)
BUN: 7 mg/dL (ref 6–20)
BUN: 7 mg/dL (ref 6–20)
CO2: 22 mmol/L (ref 22–32)
CO2: 21 mmol/L — ABNORMAL LOW (ref 22–32)
Calcium: 9.8 mg/dL (ref 8.9–10.3)
Calcium: 10.4 mg/dL — ABNORMAL HIGH (ref 8.9–10.3)
Chloride: 100 mmol/L (ref 98–111)
Chloride: 98 mmol/L (ref 98–111)
Creatinine, Ser: 0.76 mg/dL (ref 0.44–1.00)
Creatinine, Ser: 0.74 mg/dL (ref 0.44–1.00)
GFR, Estimated: >60 mL/min
GFR, Estimated: >60 mL/min
Glucose, Bld: 209 mg/dL — ABNORMAL HIGH (ref 70–99)
Glucose, Bld: 160 mg/dL — ABNORMAL HIGH (ref 70–99)
Potassium: 3.8 mmol/L (ref 3.5–5.1)
Potassium: 3.4 mmol/L — ABNORMAL LOW (ref 3.5–5.1)
Sodium: 136 mmol/L (ref 135–145)
Sodium: 138 mmol/L (ref 135–145)

## 2024-05-24 LAB — HCG, SERUM, QUALITATIVE: Preg, Serum: NEGATIVE

## 2024-05-24 LAB — GLUCOSE, CAPILLARY: Glucose-Capillary: 148 mg/dL — ABNORMAL HIGH (ref 70–99)

## 2024-05-24 LAB — BLOOD GAS, VENOUS
Acid-base deficit: 2.6 mmol/L — ABNORMAL HIGH (ref 0.0–2.0)
Bicarbonate: 23.2 mmol/L (ref 20.0–28.0)
O2 Saturation: 40.2 %
Patient temperature: 37
pCO2, Ven: 43 mmHg — ABNORMAL LOW (ref 44–60)
pH, Ven: 7.34 (ref 7.25–7.43)
pO2, Ven: <31 mmHg — CL (ref 32–45)

## 2024-05-24 LAB — ETHANOL: Alcohol, Ethyl (B): <15 mg/dL

## 2024-05-24 LAB — LIPASE, BLOOD: Lipase: 13 U/L (ref 11–51)

## 2024-05-24 LAB — BETA-HYDROXYBUTYRIC ACID
Beta-Hydroxybutyric Acid: 1.62 mmol/L — ABNORMAL HIGH (ref 0.05–0.27)
Beta-Hydroxybutyric Acid: 2.32 mmol/L — ABNORMAL HIGH (ref 0.05–0.27)

## 2024-05-24 MED ORDER — LACTATED RINGERS IV BOLUS
1000.0000 mL | Freq: Once | INTRAVENOUS | Status: AC
  Administered 2024-05-24: 1000 mL via INTRAVENOUS

## 2024-05-24 MED ORDER — ACETAMINOPHEN 325 MG PO TABS: 650.0000 mg | ORAL_TABLET | Freq: Four times a day (QID) | ORAL | Status: DC | PRN

## 2024-05-24 MED ORDER — LACTATED RINGERS IV SOLN: INTRAVENOUS | Status: DC

## 2024-05-24 MED ORDER — SODIUM CHLORIDE 0.9 % IV SOLN
12.5000 mg | Freq: Four times a day (QID) | INTRAVENOUS | Status: DC | PRN
  Administered 2024-05-24 – 2024-05-25 (×2): 12.5 mg via INTRAVENOUS
  Filled 2024-05-24 (×2): qty 12.5

## 2024-05-24 MED ORDER — ALPRAZOLAM 0.5 MG PO TABS: 0.5000 mg | ORAL_TABLET | Freq: Two times a day (BID) | ORAL | Status: DC | PRN

## 2024-05-24 MED ORDER — TRAZODONE HCL 50 MG PO TABS
25.0000 mg | ORAL_TABLET | Freq: Every evening | ORAL | Status: DC | PRN
  Administered 2024-05-26: 25 mg via ORAL
  Filled 2024-05-24 (×2): qty 1

## 2024-05-24 MED ORDER — ACETAMINOPHEN 650 MG RE SUPP: 650.0000 mg | Freq: Four times a day (QID) | RECTAL | Status: DC | PRN

## 2024-05-24 MED ORDER — DIPHENHYDRAMINE HCL 50 MG/ML IJ SOLN
12.5000 mg | Freq: Once | INTRAMUSCULAR | Status: AC
  Administered 2024-05-24: 12.5 mg via INTRAVENOUS
  Filled 2024-05-24: qty 1

## 2024-05-24 MED ORDER — MORPHINE SULFATE (PF) 4 MG/ML IV SOLN
4.0000 mg | Freq: Once | INTRAVENOUS | Status: AC
  Administered 2024-05-24: 4 mg via INTRAVENOUS
  Filled 2024-05-24: qty 1

## 2024-05-24 MED ORDER — METOCLOPRAMIDE HCL 5 MG/ML IJ SOLN
10.0000 mg | Freq: Four times a day (QID) | INTRAMUSCULAR | Status: DC | PRN
  Administered 2024-05-24 – 2024-05-28 (×10): 10 mg via INTRAVENOUS
  Filled 2024-05-24 (×11): qty 2

## 2024-05-24 MED ORDER — INSULIN PUMP
Freq: Three times a day (TID) | SUBCUTANEOUS | Status: DC
  Administered 2024-05-24: 1.62 via SUBCUTANEOUS
  Administered 2024-05-24: 0.52 via SUBCUTANEOUS
  Administered 2024-05-25: 1.3 via SUBCUTANEOUS
  Filled 2024-05-24: qty 1

## 2024-05-24 MED ORDER — ENOXAPARIN SODIUM 40 MG/0.4ML IJ SOSY
40.0000 mg | PREFILLED_SYRINGE | INTRAMUSCULAR | Status: DC
  Administered 2024-05-24 – 2024-05-28 (×5): 40 mg via SUBCUTANEOUS
  Filled 2024-05-24 (×5): qty 0.4

## 2024-05-24 MED ORDER — MORPHINE SULFATE (PF) 2 MG/ML IV SOLN
1.0000 mg | Freq: Four times a day (QID) | INTRAVENOUS | Status: DC | PRN
  Administered 2024-05-24 – 2024-05-28 (×12): 1 mg via INTRAVENOUS
  Filled 2024-05-24 (×13): qty 1

## 2024-05-24 MED ORDER — DROPERIDOL 2.5 MG/ML IJ SOLN
1.2500 mg | Freq: Once | INTRAMUSCULAR | Status: AC
  Administered 2024-05-24: 1.25 mg via INTRAVENOUS
  Filled 2024-05-24: qty 2

## 2024-05-24 MED ORDER — ALBUTEROL SULFATE (2.5 MG/3ML) 0.083% IN NEBU: 2.5000 mg | INHALATION_SOLUTION | RESPIRATORY_TRACT | Status: DC | PRN

## 2024-05-24 MED ORDER — ONDANSETRON HCL 4 MG/2ML IJ SOLN
4.0000 mg | Freq: Four times a day (QID) | INTRAMUSCULAR | Status: DC | PRN
  Administered 2024-05-24 – 2024-05-27 (×7): 4 mg via INTRAVENOUS
  Filled 2024-05-24 (×7): qty 2

## 2024-05-24 MED ORDER — METOCLOPRAMIDE HCL 5 MG/ML IJ SOLN
5.0000 mg | Freq: Once | INTRAMUSCULAR | Status: AC
  Administered 2024-05-24: 5 mg via INTRAVENOUS
  Filled 2024-05-24: qty 2

## 2024-05-24 NOTE — H&P (Addendum)
 " History and Physical  Judy Cook FMW:[id number] DOB: 2000-04-29 DOA: 05/24/2024  PCP: Elnor Lauraine BRAVO, NP   Chief Complaint: vomiting   HPI: Judy Cook is a 24 y.o. female with medical history significant for cannabinoid hyperemesis, type 1 diabetes with insulin  pump, GERD, polysubstance abuse, gastroparesis being admitted to the hospital with recurrent intractable nausea and vomiting.  She states that she has abdominal pain with vomiting that started yesterday morning, states that she has some subjective chills, and urinary urgency.  Denies any hematemesis, any cough, shortness of breath or any severe abdominal pain.  Feels like her typical gastroparesis flares.  She still has her insulin  pump on, and it is running at 1.18 units/hr.  Review of Systems: Please see HPI for pertinent positives and negatives. A complete 10 system review of systems are otherwise negative.  Past Medical History:  Diagnosis Date   Allergic rhinitis 07/07/2021   Cannabinoid hyperemesis syndrome    COVID 2020   mild   Diabetes mellitus (HCC)    Type 1, type 5   Dysmenorrhea 02/20/2019   GERD (gastroesophageal reflux disease)    Headache    History of herpes genitalis 06/24/2021   Intractable vomiting    SIRS (systemic inflammatory response syndrome) (HCC) 12/03/2019   Past Surgical History:  Procedure Laterality Date   DILATION AND EVACUATION N/A 07/05/2022   Procedure: DILATATION AND EVACUATION;  Surgeon: Izell Harari, MD;  Location: [facility] OR;  Service: Gynecology;  Laterality: N/A;   OPERATIVE ULTRASOUND N/A 07/05/2022   Procedure: OPERATIVE ULTRASOUND;  Surgeon: Izell Harari, MD;  Location: [facility] OR;  Service: Gynecology;  Laterality: N/A;   Social History:  reports that she has quit smoking. Her smoking use included cigarettes. She has never used smokeless tobacco. She reports current alcohol use. She reports current drug use. Drug: Marijuana.  Allergies[1]  Family History   Problem Relation Age of Onset   Diabetes Paternal Grandfather    Heart attack Paternal Grandfather    Diabetes Paternal Grandmother    Cancer Maternal Grandmother        lung   Alcohol abuse Maternal Grandfather    Alcohol abuse Father    Drug abuse Father    High Cholesterol Father    Alcohol abuse Mother    Depression Mother    Drug abuse Mother      Prior to Admission medications  Medication Sig Start Date End Date Taking? Authorizing Provider  acetaminophen  (TYLENOL ) 325 MG tablet Take 2 tablets (650 mg total) by mouth every 6 (six) hours as needed for mild pain (pain score 1-3) (or Fever >/= 101). 04/01/24   Barbarann Nest, MD  ALPRAZolam  (XANAX ) 0.5 MG tablet Take 1 tablet (0.5 mg total) by mouth 2 (two) times daily as needed for anxiety. 03/21/24   Arlon Carliss ORN, DO  cetirizine  (ZYRTEC  ALLERGY) 10 MG tablet Take 1 tablet (10 mg total) by mouth daily. Patient taking differently: Take 10 mg by mouth daily as needed for allergies. 03/24/24: Reports during TOC call, takes prn 07/25/23   Christopher Savannah, PA-C  Continuous Glucose Sensor (DEXCOM G7 SENSOR) MISC CHANGE EVERY 10 DAYS 11/05/23   Shamleffer, Ibtehal Jaralla, MD  Glucagon  (GVOKE HYPOPEN  1-PACK) 1 MG/0.2ML SOAJ Inject 1 mg into the skin as directed. 05/12/24   Shamleffer, Ibtehal Jaralla, MD  hydrOXYzine  (ATARAX ) 10 MG tablet Take 1 tablet (10 mg total) by mouth 3 (three) times daily as needed. 04/07/24   Ezzard Staci SAILOR, NP  Insulin  Infusion Pump (T:SLIM  X2 INSULIN  PUMP) DEVI by Does not apply route.    [provider]  JUNEL  FE 1/20 1-20 MG-MCG tablet Take 1 tablet by mouth daily. Patient taking differently: Take 1 tablet by mouth daily. 03/24/24: reports during [facility] call, often forgets to take consistently 03/18/24   [provider]  metoCLOPramide  (REGLAN ) 5 MG tablet Take 1 tablet (5 mg total) by mouth 4 (four) times daily -  before meals and at bedtime. 04/02/24 07/01/24  Gonfa, Taye T, MD  Multiple  Vitamins-Minerals (MULTIVITAMIN WITH MINERALS) tablet Take 2 tablets by mouth daily.    [provider]  NOVOLOG  100 UNIT/ML injection MAX DAILY 60 UNITS PER PUMP Patient taking differently: Inject 45 Units into the skin continuous. 07/10/23   Shamleffer, Ibtehal Jaralla, MD  ondansetron  (ZOFRAN -ODT) 4 MG disintegrating tablet Take 1 tablet (4 mg total) by mouth every 8 (eight) hours as needed for nausea or vomiting. 04/02/24   Gonfa, Taye T, MD  sertraline  (ZOLOFT ) 50 MG tablet Take 1 tablet (50 mg total) by mouth daily. 04/07/24 04/07/25  Ezzard Staci SAILOR, NP  VEVYE 0.1 % SOLN Place 1 drop into both eyes in the morning and at bedtime. 02/01/24   [provider]    Physical Exam: BP (!) 156/93 (BP Location: Left Arm)   Pulse 61   Temp 98.1 F (36.7 C) (Oral)   Resp 20   LMP 04/15/2024 (Approximate)   SpO2 98%  General:  Alert, oriented, calm, looks uncomfortable, feels like she needs to vomit, but in no acute distress  Cardiovascular: RRR, no murmurs or rubs, no peripheral edema  Respiratory: clear to auscultation bilaterally, no wheezes, no crackles  Abdomen: soft, voluntary guarding, nondistended, normal bowel tones heard  Skin: dry, no rashes  Musculoskeletal: no joint effusions, normal range of motion  Psychiatric: appropriate affect, normal speech  Neurologic: extraocular muscles intact, clear speech, moving all extremities with intact sensorium         Labs on Admission:  Basic Metabolic Panel: Recent Labs  Lab 05/24/24 0756 05/24/24 1127  NA 136 136  K 3.6 3.8  CL 98 100  CO2 22 22  GLUCOSE 330* 209*  BUN 9 7  CREATININE 0.92 0.76  CALCIUM  10.4* 9.8   Liver Function Tests: Recent Labs  Lab 05/24/24 0756  AST 28  ALT 21  ALKPHOS 88  BILITOT 0.5  PROT 8.5*  ALBUMIN 4.8   Recent Labs  Lab 05/24/24 0756  LIPASE 13   No results for input(s): AMMONIA in the last 168 hours. CBC: Recent Labs  Lab 05/24/24 0756  WBC 10.1  NEUTROABS 8.8*   HGB 14.7  HCT 43.2  MCV 92.5  PLT 427*   Cardiac Enzymes: No results for input(s): CKTOTAL, CKMB, CKMBINDEX, TROPONINI in the last 168 hours. BNP (last 3 results) No results for input(s): BNP in the last 8760 hours.  ProBNP (last 3 results) No results for input(s): PROBNP in the last 8760 hours.  CBG: Recent Labs  Lab 05/24/24 0715 05/24/24 0940  GLUCAP 278* 223*    Radiological Exams on Admission: No results found. Assessment/Plan Judy Cook is a 24 y.o. female with medical history significant for cannabinoid hyperemesis, type 1 diabetes with insulin  pump, GERD, polysubstance abuse, gastroparesis being admitted to the hospital with recurrent intractable nausea and vomiting.    Intractable nausea and vomiting-likely due to gastroparesis flare, versus cannabinoid hyperemesis.  Note she is positive for THC and cocaine.  Does not appear to be any acute  infectious cause. -Observation admission -Clear liquid diet as tolerated -Pain and nausea medication as needed  Type 1 diabetes, hyperglycemia with evidence of mild DKA-patient has elevated beta hydroxybutyric acid, initially had minimal anion gap but no evidence of metabolic acidosis.  Anion gap has closed with IV fluid bolus. -LR infusion -Continue insulin  pump, orders placed for patient management and POC checks -Recheck BMP in 6 hours as well as BHB  DVT prophylaxis: Lovenox      Code Status: Full Code  Consults called: None  Admission status: Observation  Time spent: 52 minutes  Verdis Bassette CHRISTELLA Gail MD [facility] Pager [phone]  If 7PM-7AM, please contact night-coverage [email or web address] Password [email or web address]  05/24/2024, 3:36 PM      [1] No Known Allergies  "

## 2024-05-24 NOTE — ED Notes (Signed)
 Patient noted by multiple staff members at different times, sticking fingers down throat causing vomiting.

## 2024-05-24 NOTE — ED Triage Notes (Signed)
 Patient BIB EMS for evaluation of nausea, vomiting, and abdominal pain.  Hx of gastroparesis.  Reports symptoms started 2 hours ago.  Was given Zofran  4 mg IV by EMS prior to arrival.  No reports of fever. Does report ETOH and marijuana use tonight

## 2024-05-24 NOTE — ED Provider Notes (Signed)
 " [place] EMERGENCY DEPARTMENT AT [facility] Provider Note   CSN: [id number] Arrival date & time: 05/24/24  9579     Patient presents with: Emesis and Abdominal Pain   Judy Cook is a 24 y.o. female marijuana use, cyclic vomiting, diabetic gastroparesis, type 1 diabetes, polysubstance use presents the emergency department for evaluation of nausea, vomiting, and diffuse abdominal pain for the past 6 to 7 hours.  Reports that this feels like her gastroparesis.  Endorses alcohol and marijuana use last night.  She denies any coffee-ground emesis or hematemesis.  Denies any fever or any urinary symptoms.  Reports that her sugars have been in the 200s.  Denies any recent flulike symptoms.  Passing gas, no diarrhea or constipation.   Emesis Associated symptoms: abdominal pain   Associated symptoms: no chills, no diarrhea and no fever   Abdominal Pain Associated symptoms: nausea and vomiting   Associated symptoms: no chest pain, no chills, no constipation, no diarrhea, no dysuria, no fever, no hematuria and no shortness of breath        Prior to Admission medications  Medication Sig Start Date End Date Taking? Authorizing Provider  acetaminophen  (TYLENOL ) 325 MG tablet Take 2 tablets (650 mg total) by mouth every 6 (six) hours as needed for mild pain (pain score 1-3) (or Fever >/= 101). 04/01/24   Barbarann Nest, MD  ALPRAZolam  (XANAX ) 0.5 MG tablet Take 1 tablet (0.5 mg total) by mouth 2 (two) times daily as needed for anxiety. 03/21/24   Arlon Carliss ORN, DO  cetirizine  (ZYRTEC  ALLERGY) 10 MG tablet Take 1 tablet (10 mg total) by mouth daily. Patient taking differently: Take 10 mg by mouth daily as needed for allergies. 03/24/24: Reports during TOC call, takes prn 07/25/23   Christopher Savannah, PA-C  Continuous Glucose Sensor (DEXCOM G7 SENSOR) MISC CHANGE EVERY 10 DAYS 11/05/23   Shamleffer, Ibtehal Jaralla, MD  Glucagon  (GVOKE HYPOPEN  1-PACK) 1 MG/0.2ML SOAJ Inject 1 mg  into the skin as directed. 05/12/24   Shamleffer, Ibtehal Jaralla, MD  hydrOXYzine  (ATARAX ) 10 MG tablet Take 1 tablet (10 mg total) by mouth 3 (three) times daily as needed. 04/07/24   Ezzard Staci SAILOR, NP  Insulin  Infusion Pump (T:SLIM X2 INSULIN  PUMP) DEVI by Does not apply route.    [provider]  JUNEL  FE 1/20 1-20 MG-MCG tablet Take 1 tablet by mouth daily. Patient taking differently: Take 1 tablet by mouth daily. 03/24/24: reports during [facility] call, often forgets to take consistently 03/18/24   [provider]  metoCLOPramide  (REGLAN ) 5 MG tablet Take 1 tablet (5 mg total) by mouth 4 (four) times daily -  before meals and at bedtime. 04/02/24 07/01/24  Gonfa, Taye T, MD  Multiple Vitamins-Minerals (MULTIVITAMIN WITH MINERALS) tablet Take 2 tablets by mouth daily.    [provider]  NOVOLOG  100 UNIT/ML injection MAX DAILY 60 UNITS PER PUMP Patient taking differently: Inject 45 Units into the skin continuous. 07/10/23   Shamleffer, Ibtehal Jaralla, MD  ondansetron  (ZOFRAN -ODT) 4 MG disintegrating tablet Take 1 tablet (4 mg total) by mouth every 8 (eight) hours as needed for nausea or vomiting. 04/02/24   Gonfa, Taye T, MD  sertraline  (ZOLOFT ) 50 MG tablet Take 1 tablet (50 mg total) by mouth daily. 04/07/24 04/07/25  Ezzard Staci SAILOR, NP  VEVYE 0.1 % SOLN Place 1 drop into both eyes in the morning and at bedtime. 02/01/24   [provider]    Allergies: Patient has no known  allergies.    Review of Systems  Constitutional:  Negative for chills and fever.  Respiratory:  Negative for shortness of breath.   Cardiovascular:  Negative for chest pain.  Gastrointestinal:  Positive for abdominal pain, nausea and vomiting. Negative for constipation and diarrhea.  Genitourinary:  Negative for dysuria and hematuria.    Updated Vital Signs BP (!) 154/97 (BP Location: Right Arm)   Pulse 77   Temp 98.5 F (36.9 C) (Oral)   Resp (!) 22   LMP 04/15/2024 (Approximate)    SpO2 100%   Physical Exam Vitals and nursing note reviewed.  Constitutional:      Appearance: She is not toxic-appearing.     Comments: Appears uncomfortable but no acute distress  Eyes:     General: Scleral icterus present.  Cardiovascular:     Rate and Rhythm: Normal rate.  Pulmonary:     Effort: Pulmonary effort is normal. No respiratory distress.  Abdominal:     General: There is no distension.     Palpations: Abdomen is soft.     Tenderness: There is generalized abdominal tenderness.     Comments: Mild abdominal tender to palpation.  Soft.  No guarding rebound.  Skin:    General: Skin is warm and dry.  Neurological:     Mental Status: She is alert.     (all labs ordered are listed, but only abnormal results are displayed) Labs Reviewed  CBC WITH DIFFERENTIAL/PLATELET - Abnormal; Notable for the following components:      Result Value   Platelets 427 (*)    Neutro Abs 8.8 (*)    All other components within normal limits  COMPREHENSIVE METABOLIC PANEL WITH GFR - Abnormal; Notable for the following components:   Glucose, Bld 330 (*)    Calcium  10.4 (*)    Total Protein 8.5 (*)    Anion gap 17 (*)    All other components within normal limits  URINE DRUG SCREEN - Abnormal; Notable for the following components:   Cocaine POSITIVE (*)    Tetrahydrocannabinol POSITIVE (*)    All other components within normal limits  URINALYSIS, ROUTINE W REFLEX MICROSCOPIC - Abnormal; Notable for the following components:   APPearance HAZY (*)    Glucose, UA >=500 (*)    Ketones, ur 20 (*)    Protein, ur 30 (*)    All other components within normal limits  BETA-HYDROXYBUTYRIC ACID - Abnormal; Notable for the following components:   Beta-Hydroxybutyric Acid 1.62 (*)    All other components within normal limits  BLOOD GAS, VENOUS - Abnormal; Notable for the following components:   pCO2, Ven 43 (*)    pO2, Ven <31 (*)    Acid-base deficit 2.6 (*)    All other components within  normal limits  CBG MONITORING, ED - Abnormal; Notable for the following components:   Glucose-Capillary 278 (*)    All other components within normal limits  CBG MONITORING, ED - Abnormal; Notable for the following components:   Glucose-Capillary 223 (*)    All other components within normal limits  LIPASE, BLOOD  HCG, SERUM, QUALITATIVE  ETHANOL    EKG: None  Radiology: No results found.  Procedures   Medications Ordered in the ED  droperidol  (INAPSINE ) 2.5 MG/ML injection 1.25 mg (1.25 mg Intravenous Given 05/24/24 0757)  diphenhydrAMINE  (BENADRYL ) injection 12.5 mg (12.5 mg Intravenous Given 05/24/24 0828)  lactated ringers  bolus 1,000 mL (0 mLs Intravenous Stopped 05/24/24 0920)  morphine  (PF) 4 MG/ML injection 4  mg (4 mg Intravenous Given 05/24/24 0851)  lactated ringers  bolus 1,000 mL (1,000 mLs Intravenous New Bag/Given 05/24/24 0934)    Clinical Course as of 05/24/24 1556  Sat May 24, 2024  1119 Evaluated patient, patient has a history of diabetes, gastroparesis.  Came in with nausea/vomiting.  Initial metabolic panel with hyperglycemia, elevated anion gap, however normal bicarb, not consistent with DKA, more consistent with dehydration.  Patient received 2 L fluid, will reevaluate BMP to ensure patient is improving rather than worsening and tipping into DKA. [LS]  1209 Patient reports that she is still doing better, Gap and glucose have improved. Have given her diet gingerale and crackers for PO challenge.  [RR]    Clinical Course User Index [LS] Rogelia Jerilynn RAMAN, MD [RR] Bernis Ernst, PA-C                               Medical Decision Making Amount and/or Complexity of Data Reviewed Labs: ordered.  Risk Prescription drug management. Decision regarding hospitalization.   24 y.o. female presents to the ER for evaluation of abdominal pain with nausea vomiting. Differential diagnosis includes but is not limited to AAA, mesenteric ischemia, appendicitis,  diverticulitis, DKA, gastroenteritis, nephrolithiasis, pancreatitis, constipation, UTI, bowel obstruction, biliary disease, IBD, PUD, hepatitis, gastroparesis, cannabinol induced hyperemesis syndrome. Vital signs mildly elevated blood pressure otherwise unremarkable. Physical exam as noted above.   On previous chart evaluation, patient's been admitted for similar presentation multiple times.  Most recently in November and December 2025.  She has very mild diffuse tenderness to her abdomen.  Reports that this feels like her typical gastroparesis.  Will treat with medications and reassess to see if CT imaging is needed.  Lab work ordered as well.  EKG ordered for droperidol .  I independently reviewed and interpreted the patient's labs.  hCG is negative.  Urinalysis shows hazy urine with greater than 500 glucose present.  20 ketones with 30 protein present.  No signs of infection.  CBC without leukocytosis or anemia.  Platelets had 427 with a neutrophil count of 8.8.  CMP shows glucose of 330, calcium  of 10.4 with a total protein of 8.5.  Anion gap of 17.  Normal kidney function.  Lipase within normal limits.  Beta-hydroxybutyrate acid elevated at 1.62.  Ethanol undetectable.  UDS positive cocaine and THC.SABRA  After droperidol , fluids, and Benadryl , patient reports that she is feeling much better.  Will p.o. challenge.  I have reordered a BMP to check on her and her gap which has closed.  Nurse reports that they saw the patient sticking her fingers in her mouth to vomit.  I ordered her some Reglan .  I evaluated patient multiple times that she has been resting in the bed in no acute distress.  After the Reglan  she vomited.  She would like to stay in the hospital.  Given that she is type I diabetic and was borderline DKA however normal pH, will consult hospitalist for observation.  Portions of this report may have been transcribed using voice recognition software. Every effort was made to ensure accuracy;  however, inadvertent computerized transcription errors may be present.    Final diagnoses:  Cannabinoid hyperemesis syndrome    ED Discharge Orders     None          Bernis Ernst, [place] 05/24/24 1609  "

## 2024-05-24 NOTE — ED Notes (Signed)
 Attempted x 2 to start IV without success.  IV consult placed due to difficult stick and medical hx

## 2024-05-25 DIAGNOSIS — E1010 Type 1 diabetes mellitus with ketoacidosis without coma: Secondary | ICD-10-CM | POA: Diagnosis not present

## 2024-05-25 DIAGNOSIS — R1116 Cannabis hyperemesis syndrome: Secondary | ICD-10-CM

## 2024-05-25 LAB — BASIC METABOLIC PANEL WITH GFR
Anion gap: 20 — ABNORMAL HIGH (ref 5–15)
Anion gap: 20 — ABNORMAL HIGH (ref 5–15)
Anion gap: 13 (ref 5–15)
BUN: 9 mg/dL (ref 6–20)
BUN: 9 mg/dL (ref 6–20)
BUN: 8 mg/dL (ref 6–20)
CO2: 23 mmol/L (ref 22–32)
CO2: 23 mmol/L (ref 22–32)
CO2: 26 mmol/L (ref 22–32)
Calcium: 10.4 mg/dL — ABNORMAL HIGH (ref 8.9–10.3)
Calcium: 10.1 mg/dL (ref 8.9–10.3)
Calcium: 9 mg/dL (ref 8.9–10.3)
Chloride: 92 mmol/L — ABNORMAL LOW (ref 98–111)
Chloride: 93 mmol/L — ABNORMAL LOW (ref 98–111)
Chloride: 96 mmol/L — ABNORMAL LOW (ref 98–111)
Creatinine, Ser: 0.94 mg/dL (ref 0.44–1.00)
Creatinine, Ser: 1.06 mg/dL — ABNORMAL HIGH (ref 0.44–1.00)
Creatinine, Ser: 0.87 mg/dL (ref 0.44–1.00)
GFR, Estimated: >60 mL/min
GFR, Estimated: >60 mL/min
GFR, Estimated: >60 mL/min
Glucose, Bld: 195 mg/dL — ABNORMAL HIGH (ref 70–99)
Glucose, Bld: 127 mg/dL — ABNORMAL HIGH (ref 70–99)
Glucose, Bld: 187 mg/dL — ABNORMAL HIGH (ref 70–99)
Potassium: 3 mmol/L — ABNORMAL LOW (ref 3.5–5.1)
Potassium: 3.3 mmol/L — ABNORMAL LOW (ref 3.5–5.1)
Potassium: 3.2 mmol/L — ABNORMAL LOW (ref 3.5–5.1)
Sodium: 136 mmol/L (ref 135–145)
Sodium: 136 mmol/L (ref 135–145)
Sodium: 135 mmol/L (ref 135–145)

## 2024-05-25 LAB — COMPREHENSIVE METABOLIC PANEL WITH GFR
ALT: 12 U/L (ref 0–44)
AST: 23 U/L (ref 15–41)
Albumin: 4.9 g/dL (ref 3.5–5.0)
Alkaline Phosphatase: 99 U/L (ref 38–126)
Anion gap: 20 — ABNORMAL HIGH (ref 5–15)
BUN: 9 mg/dL (ref 6–20)
CO2: 23 mmol/L (ref 22–32)
Calcium: 10.2 mg/dL (ref 8.9–10.3)
Chloride: 93 mmol/L — ABNORMAL LOW (ref 98–111)
Creatinine, Ser: 0.96 mg/dL (ref 0.44–1.00)
GFR, Estimated: >60 mL/min
Glucose, Bld: 203 mg/dL — ABNORMAL HIGH (ref 70–99)
Potassium: 3 mmol/L — ABNORMAL LOW (ref 3.5–5.1)
Sodium: 136 mmol/L (ref 135–145)
Total Bilirubin: 0.5 mg/dL (ref 0.0–1.2)
Total Protein: 8.5 g/dL — ABNORMAL HIGH (ref 6.5–8.1)

## 2024-05-25 LAB — CBC
HCT: 43.7 % (ref 36.0–46.0)
Hemoglobin: 15.2 g/dL — ABNORMAL HIGH (ref 12.0–15.0)
MCH: 31.3 pg (ref 26.0–34.0)
MCHC: 34.8 g/dL (ref 30.0–36.0)
MCV: 90.1 fL (ref 80.0–100.0)
Platelets: 485 10*3/uL — ABNORMAL HIGH (ref 150–400)
RBC: 4.85 MIL/uL (ref 3.87–5.11)
RDW: 12.2 % (ref 11.5–15.5)
WBC: 19.8 10*3/uL — ABNORMAL HIGH (ref 4.0–10.5)
nRBC: 0 % (ref 0.0–0.2)

## 2024-05-25 LAB — GLUCOSE, CAPILLARY
Glucose-Capillary: 106 mg/dL — ABNORMAL HIGH (ref 70–99)
Glucose-Capillary: 119 mg/dL — ABNORMAL HIGH (ref 70–99)
Glucose-Capillary: 129 mg/dL — ABNORMAL HIGH (ref 70–99)
Glucose-Capillary: 144 mg/dL — ABNORMAL HIGH (ref 70–99)
Glucose-Capillary: 148 mg/dL — ABNORMAL HIGH (ref 70–99)
Glucose-Capillary: 164 mg/dL — ABNORMAL HIGH (ref 70–99)
Glucose-Capillary: 175 mg/dL — ABNORMAL HIGH (ref 70–99)
Glucose-Capillary: 179 mg/dL — ABNORMAL HIGH (ref 70–99)
Glucose-Capillary: 186 mg/dL — ABNORMAL HIGH (ref 70–99)
Glucose-Capillary: 200 mg/dL — ABNORMAL HIGH (ref 70–99)

## 2024-05-25 LAB — MRSA NEXT GEN BY PCR, NASAL: MRSA by PCR Next Gen: NOT DETECTED

## 2024-05-25 MED ORDER — POTASSIUM CHLORIDE 10 MEQ/100ML IV SOLN
10.0000 meq | INTRAVENOUS | Status: AC
  Administered 2024-05-25 – 2024-05-26 (×4): 10 meq via INTRAVENOUS
  Filled 2024-05-25 (×4): qty 100

## 2024-05-25 MED ORDER — SERTRALINE HCL 50 MG PO TABS
50.0000 mg | ORAL_TABLET | Freq: Every day | ORAL | Status: DC
  Administered 2024-05-25 – 2024-05-29 (×5): 50 mg via ORAL
  Filled 2024-05-25 (×5): qty 1

## 2024-05-25 MED ORDER — KETOROLAC TROMETHAMINE 15 MG/ML IJ SOLN
15.0000 mg | Freq: Four times a day (QID) | INTRAMUSCULAR | Status: DC
  Administered 2024-05-25 – 2024-05-29 (×14): 15 mg via INTRAVENOUS
  Filled 2024-05-25 (×15): qty 1

## 2024-05-25 MED ORDER — DEXTROSE 50 % IV SOLN
0.0000 mL | INTRAVENOUS | Status: DC | PRN
  Filled 2024-05-25: qty 50

## 2024-05-25 MED ORDER — DEXTROSE IN LACTATED RINGERS 5 % IV SOLN: INTRAVENOUS | Status: AC

## 2024-05-25 MED ORDER — INSULIN REGULAR(HUMAN) IN NACL 100-0.9 UT/100ML-% IV SOLN
INTRAVENOUS | Status: DC
  Administered 2024-05-25: 2.2 [IU]/h via INTRAVENOUS
  Filled 2024-05-25: qty 100

## 2024-05-25 MED ORDER — POTASSIUM CHLORIDE 10 MEQ/100ML IV SOLN
10.0000 meq | INTRAVENOUS | Status: AC
  Administered 2024-05-25 (×4): 10 meq via INTRAVENOUS
  Filled 2024-05-25 (×4): qty 100

## 2024-05-25 MED ORDER — CHLORHEXIDINE GLUCONATE CLOTH 2 % EX PADS
6.0000 | MEDICATED_PAD | Freq: Every day | CUTANEOUS | Status: DC
  Administered 2024-05-25 – 2024-05-28 (×3): 6 via TOPICAL

## 2024-05-25 MED ORDER — HYDROXYZINE HCL 10 MG PO TABS
10.0000 mg | ORAL_TABLET | Freq: Three times a day (TID) | ORAL | Status: DC
  Administered 2024-05-25 – 2024-05-29 (×9): 10 mg via ORAL
  Filled 2024-05-25 (×10): qty 1

## 2024-05-25 NOTE — Plan of Care (Signed)

## 2024-05-25 NOTE — Progress Notes (Signed)
 [facility]  Georgene R Talven-Thornton FMW:[id number]  DOB: 12-07-2000  DOA: 05/24/2024  PCP: Elnor Lauraine BRAVO, NP  05/25/2024,7:36 AM  LOS: 0 days    Code Status: Full code     from: Home  24 year old female DM type I  since age EtOH use reflux  anxiety Followed by psychiatry   PCOS  polysubstance abuse cocaine and THC Multiple admissions for DKA recently since November 2025 for DKA sinus  followed by  endocrinology Dr.SHamleffer last saw her 1/19-A1c 10.7-PAP setting 0.8/H   1/31 present from home with nausea vomiting abdominal pain  in the setting of recent EtOH/THC use prior to this evening UDS positive for cocaine THC this admission  136 potassium 3.6 BUN/creatinine 9/0.9 gap 17-WBC 10.1 hemoglobin 14.7 platelet 427 UA 500 leuk was neg-- 20 ketones alcohol level less than 15   admitted for nausea vomiting in the setting of  gastroparesis from multiple factors   Assessment  & Plan :    Nausea vomiting likely secondary to diabetic gastroparesis, cannabis hyperemesis syndrome but could be DKA Last labs show gap of 19 at 9 PM last night with CO2 21-last beta-hydroxybutyrate was elevated at 2.3 as well Repeat labs stat this morning to ensure that the gap is closed-if not will need management with insulin  gtt. at stepdown level of care Continues on lactated Ringer  125 cc/H Still not really able to eat -is nauseous-continue Reglan  10 every 6 as needed N/V continue Phenergan  12.5 every 6-- Zofran  2 g every 6 as needed refractory nausea vomiting Pain control with morphine  1 mg every 6 sparingly as this can worsen the gastroparesis  Moderately controlled diabetes mellitus last A1c 7 Previous poorly controlled because of inability to carb count and did better with standing carbohydrate counting Would keep on insulin  pump at this time CBGs have been ranging 140s to 160s See above discussion  Anxiety/depression Resume sertraline  50 if able to take, hydroxyzine  10 3 times daily  meals  Polysubstance abuse cocaine, THC Unclear if she will quit-cannabis may have a direct effect on her hyperemesis  Data Reviewed today: No labs today  DVT prophylaxis: Lovenox   Dispo/Global plan: Inpatient pending resolution     Subjective:   Awake coherent looks nauseous and uncomfortable at the bedside-she is having some hiccups and abdominal spasm No chest pain Has not vomited this morning  Objective + exam Vitals:   05/24/24 2024 05/25/24 0055 05/25/24 0241 05/25/24 0441  BP: 129/78 (!) 144/103  (!) 154/96  Pulse: 70 83  62  Resp: 18 17  19   Temp: 98.6 F (37 C) (!) 97.5 F (36.4 C) 98.7 F (37.1 C) 98.3 F (36.8 C)  TempSrc:  Oral Oral Oral  SpO2: 100% 100%  100%   There were no vitals filed for this visit.   Examination: EOMI NCAT hirsute black female in no distress CTAB no added sound S1-S2 no murmur Abdomen slightly distended-seems a little bit uncomfortable ROM intact Power 5/5 no lower extremity edema    Scheduled Meds:  enoxaparin  (LOVENOX ) injection  40 mg Subcutaneous Q24H   insulin  pump   Subcutaneous TID WC, HS, 0200   Continuous Infusions:  lactated ringers  125 mL/hr at 05/24/24 1537   promethazine  (PHENERGAN ) injection (IM or IVPB) 12.5 mg (05/24/24 2314)   acetaminophen  **OR** acetaminophen , albuterol , metoCLOPramide  (REGLAN ) injection, morphine  injection, ondansetron  (ZOFRAN ) IV, promethazine  (PHENERGAN ) injection (IM or IVPB), traZODone   I spent 46 minutes today before, during and after this patient interview and examination--reviewing pertinent data, coordinating  the patient's care and in communication with the care team and other medical professionals  Colen Grimes, MD  [facility]

## 2024-05-26 ENCOUNTER — Encounter (HOSPITAL_COMMUNITY): Payer: Self-pay | Admitting: Internal Medicine

## 2024-05-26 DIAGNOSIS — E1010 Type 1 diabetes mellitus with ketoacidosis without coma: Secondary | ICD-10-CM

## 2024-05-26 DIAGNOSIS — R1116 Cannabis hyperemesis syndrome: Secondary | ICD-10-CM | POA: Diagnosis not present

## 2024-05-26 LAB — CBC WITH DIFFERENTIAL/PLATELET
Abs Immature Granulocytes: 0.03 10*3/uL (ref 0.00–0.07)
Basophils Absolute: 0 10*3/uL (ref 0.0–0.1)
Basophils Relative: 0 %
Eosinophils Absolute: 0 10*3/uL (ref 0.0–0.5)
Eosinophils Relative: 0 %
HCT: 43.3 % (ref 36.0–46.0)
Hemoglobin: 14.7 g/dL (ref 12.0–15.0)
Immature Granulocytes: 0 %
Lymphocytes Relative: 28 %
Lymphs Abs: 3.1 10*3/uL (ref 0.7–4.0)
MCH: 31.5 pg (ref 26.0–34.0)
MCHC: 33.9 g/dL (ref 30.0–36.0)
MCV: 92.9 fL (ref 80.0–100.0)
Monocytes Absolute: 1 10*3/uL (ref 0.1–1.0)
Monocytes Relative: 9 %
Neutro Abs: 7 10*3/uL (ref 1.7–7.7)
Neutrophils Relative %: 63 %
Platelets: 382 10*3/uL (ref 150–400)
RBC: 4.66 MIL/uL (ref 3.87–5.11)
RDW: 12.5 % (ref 11.5–15.5)
WBC: 11.2 10*3/uL — ABNORMAL HIGH (ref 4.0–10.5)
nRBC: 0 % (ref 0.0–0.2)

## 2024-05-26 LAB — BASIC METABOLIC PANEL WITH GFR
Anion gap: 14 (ref 5–15)
Anion gap: 10 (ref 5–15)
Anion gap: 12 (ref 5–15)
BUN: 8 mg/dL (ref 6–20)
BUN: 6 mg/dL (ref 6–20)
BUN: 6 mg/dL (ref 6–20)
CO2: 25 mmol/L (ref 22–32)
CO2: 26 mmol/L (ref 22–32)
CO2: 25 mmol/L (ref 22–32)
Calcium: 8.9 mg/dL (ref 8.9–10.3)
Calcium: 8.7 mg/dL — ABNORMAL LOW (ref 8.9–10.3)
Calcium: 8.9 mg/dL (ref 8.9–10.3)
Chloride: 96 mmol/L — ABNORMAL LOW (ref 98–111)
Chloride: 98 mmol/L (ref 98–111)
Chloride: 97 mmol/L — ABNORMAL LOW (ref 98–111)
Creatinine, Ser: 0.8 mg/dL (ref 0.44–1.00)
Creatinine, Ser: 0.76 mg/dL (ref 0.44–1.00)
Creatinine, Ser: 0.84 mg/dL (ref 0.44–1.00)
GFR, Estimated: >60 mL/min
GFR, Estimated: >60 mL/min
GFR, Estimated: >60 mL/min
Glucose, Bld: 173 mg/dL — ABNORMAL HIGH (ref 70–99)
Glucose, Bld: 178 mg/dL — ABNORMAL HIGH (ref 70–99)
Glucose, Bld: 289 mg/dL — ABNORMAL HIGH (ref 70–99)
Potassium: 3.5 mmol/L (ref 3.5–5.1)
Potassium: 3.4 mmol/L — ABNORMAL LOW (ref 3.5–5.1)
Potassium: 3.1 mmol/L — ABNORMAL LOW (ref 3.5–5.1)
Sodium: 134 mmol/L — ABNORMAL LOW (ref 135–145)
Sodium: 135 mmol/L (ref 135–145)
Sodium: 135 mmol/L (ref 135–145)

## 2024-05-26 LAB — GLUCOSE, CAPILLARY
Glucose-Capillary: 107 mg/dL — ABNORMAL HIGH (ref 70–99)
Glucose-Capillary: 121 mg/dL — ABNORMAL HIGH (ref 70–99)
Glucose-Capillary: 148 mg/dL — ABNORMAL HIGH (ref 70–99)
Glucose-Capillary: 160 mg/dL — ABNORMAL HIGH (ref 70–99)
Glucose-Capillary: 165 mg/dL — ABNORMAL HIGH (ref 70–99)
Glucose-Capillary: 182 mg/dL — ABNORMAL HIGH (ref 70–99)
Glucose-Capillary: 184 mg/dL — ABNORMAL HIGH (ref 70–99)
Glucose-Capillary: 192 mg/dL — ABNORMAL HIGH (ref 70–99)
Glucose-Capillary: 203 mg/dL — ABNORMAL HIGH (ref 70–99)
Glucose-Capillary: 64 mg/dL — ABNORMAL LOW (ref 70–99)
Glucose-Capillary: 97 mg/dL (ref 70–99)

## 2024-05-26 MED ORDER — INSULIN ASPART 100 UNIT/ML IJ SOLN
0.0000 [IU] | Freq: Three times a day (TID) | INTRAMUSCULAR | Status: DC
  Administered 2024-05-27 – 2024-05-28 (×4): 3 [IU] via SUBCUTANEOUS
  Administered 2024-05-29: 2 [IU] via SUBCUTANEOUS
  Administered 2024-05-29: 5 [IU] via SUBCUTANEOUS
  Filled 2024-05-26: qty 5
  Filled 2024-05-26 (×5): qty 3
  Filled 2024-05-26: qty 2

## 2024-05-26 MED ORDER — INSULIN GLARGINE-YFGN 100 UNIT/ML ~~LOC~~ SOLN
30.0000 [IU] | Freq: Every day | SUBCUTANEOUS | Status: DC
  Administered 2024-05-26: 30 [IU] via SUBCUTANEOUS
  Filled 2024-05-26: qty 0.3

## 2024-05-26 MED ORDER — PROCHLORPERAZINE EDISYLATE 10 MG/2ML IJ SOLN
10.0000 mg | INTRAMUSCULAR | Status: DC | PRN
  Administered 2024-05-26 – 2024-05-28 (×6): 10 mg via INTRAVENOUS
  Filled 2024-05-26 (×6): qty 2

## 2024-05-26 MED ORDER — INSULIN ASPART 100 UNIT/ML IJ SOLN
0.0000 [IU] | Freq: Every day | INTRAMUSCULAR | Status: DC
  Administered 2024-05-28: 4 [IU] via SUBCUTANEOUS
  Filled 2024-05-26: qty 4

## 2024-05-26 NOTE — Inpatient Diabetes Management (Signed)
 Inpatient Diabetes Program Recommendations  AACE/ADA: New Consensus Statement on Inpatient Glycemic Control (2015)  Target Ranges:  Prepandial:   less than 140 mg/dL      Peak postprandial:   less than 180 mg/dL (1-2 hours)      Critically ill patients:  140 - 180 mg/dL   Lab Results  Component Value Date   GLUCAP 64 (L) 05/26/2024   HGBA1C 7.1 (A) 05/12/2024    Review of Glycemic Control  Diabetes history: DM1 since age 24 Outpatient Diabetes medications: Tandum Insulin  pump Current orders for Inpatient glycemic control:  Insulin  pump, Dexcom G7  Endo: Dr Sam. Last OV  Insulin  pump  Basal 0000-0800 0.8 units/H           0800-0000 1.0 units/H I:C ratio - 8 units with meal, 4 units with snack Sensitivity 45 Goal 120  Inpatient Diabetes Program Recommendations:    Continue with insulin  pump.  Pt was to be discharged this afternoon, started feeling nauseated and vomited. Introduced myself and pt was not appropriate to speak with about her diabetes.   Will f/u in am.  Thank you. Shona Brandy, RD, LDN, CDCES Inpatient Diabetes Coordinator [phone]

## 2024-05-26 NOTE — Progress Notes (Signed)
 BS Rehecked 92

## 2024-05-26 NOTE — Progress Notes (Signed)
 Anion gap has been closed for 2 electrolyte collections.  Patient currently without any nausea or abdominal pain.  Transitioning to Semglee /insulin  glargine for now but patient will eventually need to transition back to her home insulin  pump if available.  Total units of insulin  per day listed as 45.  Will utilize 30 units Semglee , will follow CBGs AC/HS with moderate sliding scale insulin .  Because she has been having issues recently with nausea and gastroparesis issues I have started her on clear liquids.  We will continue the insulin  drip for 2 hours after insulin  glargine given then discontinue.  Will continue dextrose  containing IV fluids for 2 hours and discontinue as well.  Patient no longer meets criteria to remain in the stepdown unit so I have transferred her to MedSurg level of care.

## 2024-05-27 DIAGNOSIS — R1116 Cannabis hyperemesis syndrome: Secondary | ICD-10-CM | POA: Diagnosis not present

## 2024-05-27 LAB — BASIC METABOLIC PANEL WITH GFR
Anion gap: 15 (ref 5–15)
BUN: 8 mg/dL (ref 6–20)
CO2: 24 mmol/L (ref 22–32)
Calcium: 9 mg/dL (ref 8.9–10.3)
Chloride: 95 mmol/L — ABNORMAL LOW (ref 98–111)
Creatinine, Ser: 0.8 mg/dL (ref 0.44–1.00)
GFR, Estimated: >60 mL/min
Glucose, Bld: 160 mg/dL — ABNORMAL HIGH (ref 70–99)
Potassium: 3.3 mmol/L — ABNORMAL LOW (ref 3.5–5.1)
Sodium: 133 mmol/L — ABNORMAL LOW (ref 135–145)

## 2024-05-27 LAB — GLUCOSE, CAPILLARY
Glucose-Capillary: 111 mg/dL — ABNORMAL HIGH (ref 70–99)
Glucose-Capillary: 158 mg/dL — ABNORMAL HIGH (ref 70–99)
Glucose-Capillary: 172 mg/dL — ABNORMAL HIGH (ref 70–99)
Glucose-Capillary: 179 mg/dL — ABNORMAL HIGH (ref 70–99)
Glucose-Capillary: 190 mg/dL — ABNORMAL HIGH (ref 70–99)
Glucose-Capillary: 60 mg/dL — ABNORMAL LOW (ref 70–99)
Glucose-Capillary: 71 mg/dL (ref 70–99)
Glucose-Capillary: 96 mg/dL (ref 70–99)

## 2024-05-27 MED ORDER — MAGIC MOUTHWASH W/LIDOCAINE
2.0000 mL | Freq: Three times a day (TID) | ORAL | Status: DC
  Administered 2024-05-27 – 2024-05-28 (×2): 2 mL via ORAL
  Filled 2024-05-27 (×8): qty 5

## 2024-05-27 MED ORDER — PANTOPRAZOLE SODIUM 40 MG IV SOLR
40.0000 mg | Freq: Two times a day (BID) | INTRAVENOUS | Status: DC
  Administered 2024-05-27 – 2024-05-29 (×5): 40 mg via INTRAVENOUS
  Filled 2024-05-27 (×5): qty 10

## 2024-05-27 MED ORDER — POTASSIUM CHLORIDE 20 MEQ PO PACK
40.0000 meq | PACK | Freq: Every day | ORAL | Status: DC
  Administered 2024-05-27: 40 meq via ORAL
  Filled 2024-05-27: qty 2

## 2024-05-27 MED ORDER — INSULIN GLARGINE-YFGN 100 UNIT/ML ~~LOC~~ SOLN
25.0000 [IU] | Freq: Every day | SUBCUTANEOUS | Status: DC
  Administered 2024-05-27 – 2024-05-29 (×3): 25 [IU] via SUBCUTANEOUS
  Filled 2024-05-27 (×3): qty 0.25

## 2024-05-27 MED ORDER — DEXTROSE 50 % IV SOLN
12.5000 g | INTRAVENOUS | Status: AC
  Administered 2024-05-27: 12.5 g via INTRAVENOUS

## 2024-05-27 MED ORDER — POTASSIUM CHLORIDE 10 MEQ/100ML IV SOLN
10.0000 meq | INTRAVENOUS | Status: AC
  Administered 2024-05-27 (×3): 10 meq via INTRAVENOUS
  Filled 2024-05-27 (×3): qty 100

## 2024-05-27 NOTE — Plan of Care (Signed)

## 2024-05-27 NOTE — Progress Notes (Signed)
 [facility]  Aristea R Talven-Thornton FMW:[id number]  DOB: [birth year]  DOA: 05/24/2024  PCP: Elnor Lauraine BRAVO, NP  05/27/2024,9:01 AM  LOS: 2 days    Code Status: Full code     from: Home  24 year old female DM type I  since age EtOH use reflux  anxiety Followed by psychiatry   PCOS  polysubstance abuse cocaine and THC Multiple admissions for DKA recently since November 2025 for DKA sinus  followed by  endocrinology Dr.SHamleffer last saw her 1/19-A1c 10.7-PAP setting 0.8/H   1/31 present from home with nausea vomiting abdominal pain  in the setting of recent EtOH/THC use prior to this evening UDS positive for cocaine THC this admission  136 potassium 3.6 BUN/creatinine 9/0.9 gap 17-WBC 10.1 hemoglobin 14.7 platelet 427 UA 500 leuk was neg-- 20 ketones alcohol level less than 15   admitted for nausea vomiting in the setting of  gastroparesis from multiple factors Was initially discharged on 05/26/24 but didn't do well with vomit so discharge delayed   Assessment  & Plan :    Nausea vomiting likely secondary to diabetic gastroparesis, cannabis hyperemesis syndrome but could be DKA Had mild DKA necessitating insulin  GTT Still not really able to eat -is nauseous-continue Reglan  10 every 6 as needed N/V--still uncomfortable so will need treat several meals and feel close to normal before decision to d/c again continue Phenergan  12.5 every 6-- Zofran  2 g every 6 as needed refractory nausea vomiting- adding IV Protonix  40 bid for reflux like symptoms Pain control with morphine  1 mg every 6 sparingly as this can worsen the gastroparesis--no escalation of Narcotics as will worsen gastroparesis  Moderately controlled diabetes mellitus last A1c 7 Hypoglycemia overnight 2/2 Some hypoglycemia overnight requiring 1 amp D50-on basal bolus--is off of the insulin  pump from home Have switched the Lantus  to 25 U to ensure doesn't drop--continue moderate sliding scale coverage with HS Sugar ranges are now  140-190---resume pump once more able  Hypokalemia mild Will replace with k packets as she cannot eat a whole lot.  Review labs again in am  Anxiety/depression Resume sertraline  50 , hydroxyzine  10 3 times daily meals  Polysubstance abuse cocaine, THC Unclear if she will quit-cannabis may have a direct effect on her hyperemesis  Data Reviewed today: No labs today  DVT prophylaxis: Lovenox   Dispo/Global plan: Inpatient pending resolution     Subjective:   Uncomfortable appearing. No distress, no fever no chills --no stool today  Objective + exam Vitals:   05/26/24 1228 05/26/24 1330 05/26/24 2137 05/27/24 0411  BP: (!) 140/100 (!) 133/96 (!) 157/100 (!) 158/92  Pulse: (!) 103  85 89  Resp:   16 15  Temp: 99.1 F (37.3 C)  98.1 F (36.7 C) 98.5 F (36.9 C)  TempSrc: Oral  Oral Oral  SpO2: 100%  99% 94%  Weight:      Height:       Filed Weights   05/26/24 0615  Weight: 65.7 kg   Examination:  EOMI NCAT hirsute black female in no distress CTAB no added sound S1 s 2 Abd distended no rebound no gaurd  Scheduled Meds:  Chlorhexidine  Gluconate Cloth  6 each Topical Daily   enoxaparin  (LOVENOX ) injection  40 mg Subcutaneous Q24H   hydrOXYzine   10 mg Oral TID   insulin  aspart  0-15 Units Subcutaneous TID WC   insulin  aspart  0-5 Units Subcutaneous QHS   insulin  glargine-yfgn  25 Units Subcutaneous Daily   ketorolac   15 mg Intravenous  Q6H   pantoprazole  (PROTONIX ) IV  40 mg Intravenous Q12H   sertraline   50 mg Oral Daily   Continuous Infusions:   acetaminophen  **OR** acetaminophen , albuterol , dextrose , metoCLOPramide  (REGLAN ) injection, morphine  injection, ondansetron  (ZOFRAN ) IV, prochlorperazine , traZODone   I spent 26 minutes today before, during and after this patient interview and examination--reviewing pertinent data, coordinating the patient's care and in communication with the care team and other medical professionals  Colen Grimes, MD  [facility]

## 2024-05-27 NOTE — Progress Notes (Addendum)
 Pt called out for a cup of ice. When I went to the room she said her dexacom alerted her that her sugar was low. Checked it. 60. Informed the pt's nurse. Placed the standing orders for hypoglycemia. Will notify the md (notified [address], NP) via secure chat.

## 2024-05-28 LAB — GLUCOSE, CAPILLARY
Glucose-Capillary: 198 mg/dL — ABNORMAL HIGH (ref 70–99)
Glucose-Capillary: 199 mg/dL — ABNORMAL HIGH (ref 70–99)
Glucose-Capillary: 105 mg/dL — ABNORMAL HIGH (ref 70–99)
Glucose-Capillary: 339 mg/dL — ABNORMAL HIGH (ref 70–99)

## 2024-05-28 LAB — CBC WITH DIFFERENTIAL/PLATELET
Abs Immature Granulocytes: 0.04 10*3/uL (ref 0.00–0.07)
Basophils Absolute: 0 10*3/uL (ref 0.0–0.1)
Basophils Relative: 0 %
Eosinophils Absolute: 0.1 10*3/uL (ref 0.0–0.5)
Eosinophils Relative: 1 %
HCT: 40.5 % (ref 36.0–46.0)
Hemoglobin: 13.9 g/dL (ref 12.0–15.0)
Immature Granulocytes: 1 %
Lymphocytes Relative: 25 %
Lymphs Abs: 2.1 10*3/uL (ref 0.7–4.0)
MCH: 31.6 pg (ref 26.0–34.0)
MCHC: 34.3 g/dL (ref 30.0–36.0)
MCV: 92 fL (ref 80.0–100.0)
Monocytes Absolute: 0.9 10*3/uL (ref 0.1–1.0)
Monocytes Relative: 11 %
Neutro Abs: 5 10*3/uL (ref 1.7–7.7)
Neutrophils Relative %: 62 %
Platelets: 384 10*3/uL (ref 150–400)
RBC: 4.4 MIL/uL (ref 3.87–5.11)
RDW: 12.1 % (ref 11.5–15.5)
WBC: 8.1 10*3/uL (ref 4.0–10.5)
nRBC: 0 % (ref 0.0–0.2)

## 2024-05-28 LAB — BASIC METABOLIC PANEL WITH GFR
Anion gap: 14 (ref 5–15)
BUN: 9 mg/dL (ref 6–20)
CO2: 24 mmol/L (ref 22–32)
Calcium: 8.7 mg/dL — ABNORMAL LOW (ref 8.9–10.3)
Chloride: 96 mmol/L — ABNORMAL LOW (ref 98–111)
Creatinine, Ser: 0.79 mg/dL (ref 0.44–1.00)
GFR, Estimated: >60 mL/min
Glucose, Bld: 185 mg/dL — ABNORMAL HIGH (ref 70–99)
Potassium: 3 mmol/L — ABNORMAL LOW (ref 3.5–5.1)
Sodium: 134 mmol/L — ABNORMAL LOW (ref 135–145)

## 2024-05-28 LAB — PHOSPHORUS: Phosphorus: 2.3 mg/dL — ABNORMAL LOW (ref 2.5–4.6)

## 2024-05-28 LAB — MAGNESIUM: Magnesium: 1.9 mg/dL (ref 1.7–2.4)

## 2024-05-28 MED ORDER — POTASSIUM CHLORIDE 10 MEQ/100ML IV SOLN
10.0000 meq | INTRAVENOUS | Status: AC
  Administered 2024-05-28 (×4): 10 meq via INTRAVENOUS
  Filled 2024-05-28 (×4): qty 100

## 2024-05-28 MED ORDER — SODIUM CHLORIDE 0.9 % IV SOLN: INTRAVENOUS | Status: DC

## 2024-05-28 MED ORDER — POTASSIUM CHLORIDE CRYS ER 20 MEQ PO TBCR
40.0000 meq | EXTENDED_RELEASE_TABLET | Freq: Two times a day (BID) | ORAL | Status: AC
  Administered 2024-05-28 (×2): 40 meq via ORAL
  Filled 2024-05-28 (×2): qty 2

## 2024-05-28 MED ORDER — K PHOS MONO-SOD PHOS DI & MONO 155-852-130 MG PO TABS
500.0000 mg | ORAL_TABLET | Freq: Once | ORAL | Status: AC
  Administered 2024-05-28: 500 mg via ORAL
  Filled 2024-05-28: qty 2

## 2024-05-28 NOTE — TOC Initial Note (Signed)
 Transition of Care [facility]) - Initial/Assessment Note    Patient Details  Name: Judy Cook MRN: [id number] Date of Birth: 2000-06-13  [facility] CM/SW Contact:    Doneta Glenys DASEN, RN Phone Number: 05/28/2024, 4:18 PM  Clinical Narrative:                 PTA home, apartment, sister will transport at discharge. No IP CM needs identified during visit.  Expected Discharge Plan: Home/Self Care Barriers to Discharge: No Barriers Identified   Patient Goals and CMS Choice Patient states their goals for this hospitalization and ongoing recovery are:: Home   Choice offered to / list presented to : NA      Expected Discharge Plan and Services In-house Referral: NA Discharge Planning Services: CM Consult   Living arrangements for the past 2 months: Apartment Expected Discharge Date: 05/26/24               DME Arranged: N/A DME Agency: NA       HH Arranged: NA HH Agency: NA        Prior Living Arrangements/Services Living arrangements for the past 2 months: Apartment Lives with:: Self Patient language and need for interpreter reviewed:: Yes Do you feel safe going back to the place where you live?: Yes      Need for Family Participation in Patient Care: No (Comment) Care giver support system in place?: Yes (comment) Current home services:  (na) Criminal Activity/Legal Involvement Pertinent to Current Situation/Hospitalization: No - Comment as needed  Activities of Daily Living   ADL Screening (condition at time of admission) Independently performs ADLs?: Yes (appropriate for developmental age) Is the patient deaf or have difficulty hearing?: No Does the patient have difficulty seeing, even when wearing glasses/contacts?: No Does the patient have difficulty concentrating, remembering, or making decisions?: No  Permission Sought/Granted Permission sought to share information with : Case Manager Permission granted to share information with : Yes,  Verbal Permission Granted  Share Information with NAME: Talven Thornton,Kelly  Sister, Emergency Contact  [phone]           Emotional Assessment Appearance:: Appears stated age Attitude/Demeanor/Rapport: Engaged Affect (typically observed): Appropriate Orientation: : Oriented to Self, Oriented to Place, Oriented to  Time, Oriented to Situation Alcohol / Substance Use: Not Applicable Psych Involvement: No (comment)  Admission diagnosis:  Intractable vomiting [R11.10] Cannabinoid hyperemesis syndrome [R11.16] Patient Active Problem List   Diagnosis Date Noted   Intractable vomiting 05/24/2024   Epigastric pain 04/01/2024   Hypoglycemia 04/01/2024   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   Erythrocytosis 03/22/2024   Cannabinoid hyperemesis syndrome 03/22/2024   Hypophosphatemia 03/22/2024   Cannabis abuse 03/22/2024   GAD (generalized anxiety disorder) 01/04/2024   Polycystic ovary syndrome 08/15/2023   Generalized abdominal pain 07/23/2023   Proteinuria 07/23/2023   Screening examination for STD (sexually transmitted disease) 05/04/2023   Polysubstance use disorder 02/01/2023   Intractable nausea and vomiting 12/28/2022   Diabetic gastroparesis (HCC) 12/28/2022   GERD with esophagitis 12/28/2022   AKI (acute kidney injury) 12/28/2022   Thrombocytosis 12/28/2022   Insulin  pump titration 11/20/2022   Stress diabetes 11/20/2022   Type 1 diabetes mellitus with hyperglycemia (HCC) 11/20/2022   Leukocytosis 10/14/2022   GERD (gastroesophageal reflux disease) 10/14/2022   Colitis 09/21/2022   History of type 1 diabetes mellitus 09/04/2022   RhD negative 07/05/2022   Constipation 07/07/2021   Marijuana use 02/20/2019   Cyclical vomiting 02/20/2019   Hypokalemia 02/20/2019  Dehydration 07/16/2018   PCP:  Elnor Lauraine BRAVO, NP Pharmacy:   [facility] [phone] [place], [place] - [phone] [place]  [address] Phone: [phone] Fax: [phone]  [facility] - [facility] [address] [place] [address] Phone: [phone] Fax: [phone]     Social Drivers of Health (SDOH) Social History: SDOH Screenings   Food Insecurity: No Food Insecurity (05/24/2024)  Housing: Low Risk (05/24/2024)  Recent Concern: Housing - High Risk (03/23/2024)  Transportation Needs: No Transportation Needs (05/24/2024)  Utilities: Not At Risk (05/24/2024)  Alcohol Screen: Medium Risk (02/14/2024)  Depression (PHQ2-9): High Risk (04/07/2024)  Financial Resource Strain: Medium Risk (02/14/2024)  Physical Activity: Insufficiently Active (02/14/2024)  Social Connections: Moderately Isolated (02/14/2024)  Stress: Stress Concern Present (02/14/2024)  Tobacco Use: Medium Risk (05/26/2024)   SDOH Interventions: Housing Interventions: Intervention Not Indicated, Inpatient TOC   Readmission Risk Interventions    03/21/2024    9:18 AM  Readmission Risk Prevention Plan  Post Dischage Appt Complete  Medication Screening Complete  Transportation Screening Complete

## 2024-05-28 NOTE — Plan of Care (Signed)

## 2024-05-28 NOTE — Hospital Course (Signed)
 Patient is a 24 year old overweight AAF with a past medical history significant for but not limited to diabetes mellitus type 1, alcohol use, reflux, anxiety, PCOS with history of polysubstance abuse with cocaine and THC with multiple admissions for DKA since November 2025 and followed by endocrinology in outpatient setting presented with abdominal pain, nausea vomiting on 131 in recent setting of alcohol and THC use.  UDS was positive for cocaine and THC and she was admitted with nausea vomiting abdominal discomfort in the setting of gastroparesis.  Initially she was discharged on 05/26/2024 but did not do well and started vomiting so discharge was delayed and he continues to not feel well.  Remains on a clear liquid diet for now continues to be nauseous with abdominal discomfort.  Assessment and Plan:  Nausea vomiting likely secondary to diabetic gastroparesis, cannabis hyperemesis syndrome but could be DKA:  -Had mild DKA necessitating insulin  GTT -Still not really able to eat -is nauseous-continue Reglan  10 every 6 as needed N/V--still uncomfortable so will need treat several meals and feel close to normal before decision to d/c again; Advance Diet from CLD -> FLD if able but not willing to go past CLD given her Sx currently -Continue Antiemetics w/ Metaclopramide 10 mg q6hprn, Ondansetron  4 mg IV q6hprn, Compazine  10 mg IV q4hprn Refractory N/V -Pain control with Acetaminophen  650 mg po q6hprn Mild Pain, IV Ketorolac  15 mg q6h, and IV Morphine  1 mg every 6hprn sparingly as this can worsen the gastroparesis--no escalation of Narcotics as will worsen gastroparesis   Diabetes Mellitus Type 1, Moderately Controlled with Complications of Hypoglycemia: Most recent HbA1c was 7.1 a few weeks ago. Had some hypoglycemia a few nights ago requiring 1 amp D50-on basal bolus--is off of the insulin  pump from home; Have switched the Lantus  to 25 U to ensure doesn't drop; C/w Moderate Novolog  SSI AC/HS. CBG Trend:   Recent Labs  Lab 05/27/24 0726 05/27/24 1123 05/27/24 1615 05/27/24 2137 05/27/24 2152 05/28/24 0736 05/28/24 1123  GLUCAP 190* 172* 71 96 111* 198* 199*  -Consult DM Education Coordinator and resume Insulin  Pump once further stable and tolerating diet   Hyponatremia: Na+ Trend:  Recent Labs  Lab 05/25/24 1730 05/25/24 2102 05/26/24 0126 05/26/24 0521 05/26/24 0849 05/27/24 0421 05/28/24 0436  NA 136 135 134* 135 135 133* 134*  -Initiate IVF w/ NS @ 75 mL/hr. CTM & Trend and repeat CMP in the AM.   Anxiety and Depression: C/w Sertraline  50 mg po Daily and Hydroxyzine  10 mg 3 times daily meals   Polysubstance Abuse: Hx of Cocaine, THC; UDS was positive on admission.  Unclear if she will quit-cannabis may have a direct effect on her hyperemesis; Counseling given. Check UDS  Mouth Pain: C/w Magic Mouthwash w/ Lidocaine  2 mL TID  Hypokalemia: K+ is now 3.0. Replete w/ IV KCL 40 mEQ x1, po KCL 40 mEQ BID x2, and po K Phos  Neutral 500 mg x1. CTM & Replete as Necessary. Repeat CMP in the AM   Hypophosphatemia: Phos Level was 2.3. Replete w/ po K Phos  Neutral 500 mg x1. CTM & Replete as Necessary. Repeat CMP in the AM   GERD/GI Prophylaxis: C/w IV Pantoprazole  40 mg q12h  Overweight: Complicates overall prognosis and care. Estimated body mass index is 28.29 kg/m as calculated from the following:   Height as of this encounter: 5' (1.524 m).   Weight as of this encounter: 65.7 kg. Weight Loss and Dietary Counseling given

## 2024-05-28 NOTE — Plan of Care (Signed)

## 2024-05-28 NOTE — Progress Notes (Signed)
 " PROGRESS NOTE    Judy Cook  FMW:[id number] DOB: [birth year] DOA: 05/24/2024 PCP: Elnor Lauraine BRAVO, NP   Brief Narrative:  Patient is a 24 year old overweight AAF with a past medical history significant for but not limited to diabetes mellitus type 1, alcohol use, reflux, anxiety, PCOS with history of polysubstance abuse with cocaine and THC with multiple admissions for DKA since November 2025 and followed by endocrinology in outpatient setting presented with abdominal pain, nausea vomiting on 131 in recent setting of alcohol and THC use.  UDS was positive for cocaine and THC and she was admitted with nausea vomiting abdominal discomfort in the setting of gastroparesis.  Initially she was discharged on 05/26/2024 but did not do well and started vomiting so discharge was delayed and he continues to not feel well.  Remains on a clear liquid diet for now continues to be nauseous with abdominal discomfort.  Assessment and Plan:  Nausea vomiting likely secondary to diabetic gastroparesis, cannabis hyperemesis syndrome but could be DKA:  -Had mild DKA necessitating insulin  GTT -Still not really able to eat -is nauseous-continue Reglan  10 every 6 as needed N/V--still uncomfortable so will need treat several meals and feel close to normal before decision to d/c again; Advance Diet from CLD -> FLD if able but not willing to go past CLD given her Sx currently -Continue Antiemetics w/ Metaclopramide 10 mg q6hprn, Ondansetron  4 mg IV q6hprn, Compazine  10 mg IV q4hprn Refractory N/V -Pain control with Acetaminophen  650 mg po q6hprn Mild Pain, IV Ketorolac  15 mg q6h, and IV Morphine  1 mg every 6hprn sparingly as this can worsen the gastroparesis--no escalation of Narcotics as will worsen gastroparesis   Diabetes Mellitus Type 1, Moderately Controlled with Complications of Hypoglycemia: Most recent HbA1c was 7.1 a few weeks ago. Had some hypoglycemia a few nights ago requiring 1 amp D50-on basal bolus--is  off of the insulin  pump from home; Have switched the Lantus  to 25 U to ensure doesn't drop; C/w Moderate Novolog  SSI AC/HS. CBG Trend:  Recent Labs  Lab 05/27/24 0726 05/27/24 1123 05/27/24 1615 05/27/24 2137 05/27/24 2152 05/28/24 0736 05/28/24 1123  GLUCAP 190* 172* 71 96 111* 198* 199*  -Consult DM Education Coordinator and resume Insulin  Pump once further stable and tolerating diet   Hyponatremia: Na+ Trend:  Recent Labs  Lab 05/25/24 1730 05/25/24 2102 05/26/24 0126 05/26/24 0521 05/26/24 0849 05/27/24 0421 05/28/24 0436  NA 136 135 134* 135 135 133* 134*  -Initiate IVF w/ NS @ 75 mL/hr. CTM & Trend and repeat CMP in the AM.   Anxiety and Depression: C/w Sertraline  50 mg po Daily and Hydroxyzine  10 mg 3 times daily meals   Polysubstance Abuse: Hx of Cocaine, THC; UDS was positive on admission.  Unclear if she will quit-cannabis may have a direct effect on her hyperemesis; Counseling given. Check UDS  Mouth Pain: C/w Magic Mouthwash w/ Lidocaine  2 mL TID  Hypokalemia: K+ is now 3.0. Replete w/ IV KCL 40 mEQ x1, po KCL 40 mEQ BID x2, and po K Phos  Neutral 500 mg x1. CTM & Replete as Necessary. Repeat CMP in the AM   Hypophosphatemia: Phos Level was 2.3. Replete w/ po K Phos  Neutral 500 mg x1. CTM & Replete as Necessary. Repeat CMP in the AM   GERD/GI Prophylaxis: C/w IV Pantoprazole  40 mg q12h  Overweight: Complicates overall prognosis and care. Estimated body mass index is 28.29 kg/m as calculated from the following:   Height as of this encounter:  5' (1.524 m).   Weight as of this encounter: 65.7 kg. Weight Loss and Dietary Counseling given   DVT prophylaxis: enoxaparin  (LOVENOX ) injection 40 mg Start: 05/24/24 1600    Code Status: Full Code Family Communication: No family currently at bedside  Disposition Plan:  Level of care: Med-Surg Status is: Inpatient Remains inpatient appropriate because: Needs further clinical improvement and tolerance of diet    Consultants:  None  Procedures:  As delineated as above  Antimicrobials:  Anti-infectives (From admission, onward)    None       Subjective: Seen and examined at bedside and still not feeling very well.  Start some nausea and abdominal discomfort.  Denies any lightheadedness or dizziness.  Wanting to rest.  Not willing to try and think passed a clear liquid diet given her continued nausea.  Objective: Vitals:   05/27/24 0411 05/27/24 1247 05/27/24 2150 05/28/24 1248  BP: (!) 158/92 (!) 153/114 (!) 152/98 132/84  Pulse: 89 (!) 108 90 87  Resp: 15  18 19   Temp: 98.5 F (36.9 C) 98.2 F (36.8 C) 98.6 F (37 C) 98.6 F (37 C)  TempSrc: Oral  Oral Oral  SpO2: 94% 100% 100% 100%  Weight:      Height:       No intake or output data in the 24 hours ending 05/28/24 1338 Filed Weights   05/26/24 0615  Weight: 65.7 kg   Examination: Physical Exam:  Constitutional: WN/WD overweight young female who appears a little uncomfortable Respiratory: Diminished to auscultation bilaterally, no wheezing, rales, rhonchi or crackles. Normal respiratory effort and patient is not tachypenic. No accessory muscle use.  Unlabored breathing Cardiovascular: RRR, no murmurs / rubs / gallops. S1 and S2 auscultated. No extremity edema. Abdomen: Soft, tender to palpate and slightly distended. Bowel sounds positive.  GU: Deferred. Musculoskeletal: No clubbing / cyanosis of digits/nails. No joint deformity upper and lower extremities.  Skin: No rashes, lesions, ulcers on limited skin evaluation. No induration; Warm and dry.  Neurologic: CN 2-12 grossly intact with no focal deficits. Romberg sign and cerebellar reflexes not assessed.  Psychiatric: Normal judgment and insight. Alert and oriented x 3. Normal mood and appropriate affect.   Data Reviewed: I have personally reviewed following labs and imaging studies  CBC: Recent Labs  Lab 05/24/24 0756 05/25/24 1235 05/26/24 0849 05/28/24 0436  WBC  10.1 19.8* 11.2* 8.1  NEUTROABS 8.8*  --  7.0 5.0  HGB 14.7 15.2* 14.7 13.9  HCT 43.2 43.7 43.3 40.5  MCV 92.5 90.1 92.9 92.0  PLT 427* 485* 382 384   Basic Metabolic Panel: Recent Labs  Lab 05/26/24 0126 05/26/24 0521 05/26/24 0849 05/27/24 0421 05/28/24 0436  NA 134* 135 135 133* 134*  K 3.5 3.4* 3.1* 3.3* 3.0*  CL 96* 98 97* 95* 96*  CO2 25 26 25 24 24   GLUCOSE 173* 178* 289* 160* 185*  BUN 8 6 6 8 9   CREATININE 0.80 0.76 0.84 0.80 0.79  CALCIUM  8.9 8.7* 8.9 9.0 8.7*  MG  --   --   --   --  1.9  PHOS  --   --   --   --  2.3*   GFR: Estimated Creatinine Clearance: 91 mL/min (by C-G formula based on SCr of 0.79 mg/dL). Liver Function Tests: Recent Labs  Lab 05/24/24 0756 05/25/24 1235  AST 28 23  ALT 21 12  ALKPHOS 88 99  BILITOT 0.5 0.5  PROT 8.5* 8.5*  ALBUMIN 4.8 4.9  Recent Labs  Lab 05/24/24 0756  LIPASE 13   No results for input(s): AMMONIA in the last 168 hours. Coagulation Profile: No results for input(s): INR, PROTIME in the last 168 hours. Cardiac Enzymes: No results for input(s): CKTOTAL, CKMB, CKMBINDEX, TROPONINI in the last 168 hours. BNP (last 3 results) No results for input(s): PROBNP in the last 8760 hours. HbA1C: No results for input(s): HGBA1C in the last 72 hours. CBG: Recent Labs  Lab 05/27/24 1615 05/27/24 2137 05/27/24 2152 05/28/24 0736 05/28/24 1123  GLUCAP 71 96 111* 198* 199*   Lipid Profile: No results for input(s): CHOL, HDL, LDLCALC, TRIG, CHOLHDL, LDLDIRECT in the last 72 hours. Thyroid  Function Tests: No results for input(s): TSH, T4TOTAL, FREET4, T3FREE, THYROIDAB in the last 72 hours. Anemia Panel: No results for input(s): VITAMINB12, FOLATE, FERRITIN, TIBC, IRON, RETICCTPCT in the last 72 hours. Sepsis Labs: No results for input(s): PROCALCITON, LATICACIDVEN in the last 168 hours.  Recent Results (from the past 240 hours)  MRSA Next Gen by PCR, Nasal      Status: None   Collection Time: 05/25/24  5:35 PM   Specimen: Nasal Mucosa; Nasal Swab  Result Value Ref Range Status   MRSA by PCR Next Gen NOT DETECTED NOT DETECTED Final    Comment: (NOTE) The GeneXpert MRSA Assay (FDA approved for NASAL specimens only), is one component of a comprehensive MRSA colonization surveillance program. It is not intended to diagnose MRSA infection nor to guide or monitor treatment for MRSA infections. Test performance is not FDA approved in patients less than 78 years old. Performed at [facility], [address], [place], [place] [address]     Radiology Studies: No results found.  Scheduled Meds:  Chlorhexidine  Gluconate Cloth  6 each Topical Daily   enoxaparin  (LOVENOX ) injection  40 mg Subcutaneous Q24H   hydrOXYzine   10 mg Oral TID   insulin  aspart  0-15 Units Subcutaneous TID WC   insulin  aspart  0-5 Units Subcutaneous QHS   insulin  glargine-yfgn  25 Units Subcutaneous Daily   ketorolac   15 mg Intravenous Q6H   magic mouthwash w/lidocaine   2 mL Oral TID   pantoprazole  (PROTONIX ) IV  40 mg Intravenous Q12H   potassium chloride   40 mEq Oral BID   sertraline   50 mg Oral Daily   Continuous Infusions:  sodium chloride  75 mL/hr at 05/28/24 1319   potassium chloride  10 mEq (05/28/24 1320)    LOS: 3 days   Alejandro Marker, DO [facility] Available via Epic secure chat 7am-7pm After these hours, please refer to coverage provider listed on [email or web address] 05/28/2024, 1:38 PM  "

## 2024-05-29 LAB — COMPREHENSIVE METABOLIC PANEL WITH GFR
ALT: 16 U/L (ref 0–44)
AST: 28 U/L (ref 15–41)
Albumin: 3.9 g/dL (ref 3.5–5.0)
Alkaline Phosphatase: 69 U/L (ref 38–126)
Anion gap: 12 (ref 5–15)
BUN: 7 mg/dL (ref 6–20)
CO2: 22 mmol/L (ref 22–32)
Calcium: 9.1 mg/dL (ref 8.9–10.3)
Chloride: 105 mmol/L (ref 98–111)
Creatinine, Ser: 0.77 mg/dL (ref 0.44–1.00)
GFR, Estimated: >60 mL/min
Glucose, Bld: 97 mg/dL (ref 70–99)
Potassium: 3.9 mmol/L (ref 3.5–5.1)
Sodium: 139 mmol/L (ref 135–145)
Total Bilirubin: 0.5 mg/dL (ref 0.0–1.2)
Total Protein: 6.3 g/dL — ABNORMAL LOW (ref 6.5–8.1)

## 2024-05-29 LAB — CBC WITH DIFFERENTIAL/PLATELET
Abs Immature Granulocytes: 0.02 10*3/uL (ref 0.00–0.07)
Basophils Absolute: 0 10*3/uL (ref 0.0–0.1)
Basophils Relative: 0 %
Eosinophils Absolute: 0.1 10*3/uL (ref 0.0–0.5)
Eosinophils Relative: 1 %
HCT: 41.1 % (ref 36.0–46.0)
Hemoglobin: 13.7 g/dL (ref 12.0–15.0)
Immature Granulocytes: 0 %
Lymphocytes Relative: 41 %
Lymphs Abs: 2.5 10*3/uL (ref 0.7–4.0)
MCH: 30.9 pg (ref 26.0–34.0)
MCHC: 33.3 g/dL (ref 30.0–36.0)
MCV: 92.6 fL (ref 80.0–100.0)
Monocytes Absolute: 0.7 10*3/uL (ref 0.1–1.0)
Monocytes Relative: 11 %
Neutro Abs: 2.9 10*3/uL (ref 1.7–7.7)
Neutrophils Relative %: 47 %
Platelets: 365 10*3/uL (ref 150–400)
RBC: 4.44 MIL/uL (ref 3.87–5.11)
RDW: 11.9 % (ref 11.5–15.5)
WBC: 6.1 10*3/uL (ref 4.0–10.5)
nRBC: 0 % (ref 0.0–0.2)

## 2024-05-29 LAB — GLUCOSE, CAPILLARY
Glucose-Capillary: 123 mg/dL — ABNORMAL HIGH (ref 70–99)
Glucose-Capillary: 230 mg/dL — ABNORMAL HIGH (ref 70–99)

## 2024-05-29 LAB — MAGNESIUM: Magnesium: 2.1 mg/dL (ref 1.7–2.4)

## 2024-05-29 LAB — PHOSPHORUS: Phosphorus: 2.4 mg/dL — ABNORMAL LOW (ref 2.5–4.6)

## 2024-05-29 MED ORDER — K PHOS MONO-SOD PHOS DI & MONO 155-852-130 MG PO TABS
500.0000 mg | ORAL_TABLET | Freq: Once | ORAL | Status: AC
  Administered 2024-05-29: 500 mg via ORAL
  Filled 2024-05-29: qty 2

## 2024-05-29 MED ORDER — PANTOPRAZOLE SODIUM 40 MG PO TBEC: 40.0000 mg | DELAYED_RELEASE_TABLET | Freq: Every day | ORAL | 0 refills | Status: AC

## 2024-05-29 NOTE — Plan of Care (Signed)
  Problem: Education: ?Goal: Knowledge of General Education information will improve ?Description: Including pain rating scale, medication(s)/side effects and non-pharmacologic comfort measures ?Outcome: Progressing ?  ?Problem: Health Behavior/Discharge Planning: ?Goal: Ability to manage health-related needs will improve ?Outcome: Progressing ?  ?Problem: Clinical Measurements: ?Goal: Will remain free from infection ?Outcome: Progressing ?  ?Problem: Clinical Measurements: ?Goal: Ability to maintain clinical measurements within normal limits will improve ?Outcome: Progressing ?  ?

## 2024-05-29 NOTE — Plan of Care (Signed)
 Problem: Education: Goal: Knowledge of General Education information will improve Description: Including pain rating scale, medication(s)/side effects and non-pharmacologic comfort measures Outcome: Adequate for Discharge   Problem: Health Behavior/Discharge Planning: Goal: Ability to manage health-related needs will improve Outcome: Adequate for Discharge   Problem: Clinical Measurements: Goal: Ability to maintain clinical measurements within normal limits will improve Outcome: Adequate for Discharge Goal: Will remain free from infection Outcome: Adequate for Discharge Goal: Diagnostic test results will improve Outcome: Adequate for Discharge Goal: Respiratory complications will improve Outcome: Adequate for Discharge Goal: Cardiovascular complication will be avoided Outcome: Adequate for Discharge   Problem: Activity: Goal: Risk for activity intolerance will decrease Outcome: Adequate for Discharge   Problem: Nutrition: Goal: Adequate nutrition will be maintained Outcome: Adequate for Discharge   Problem: Coping: Goal: Level of anxiety will decrease Outcome: Adequate for Discharge   Problem: Elimination: Goal: Will not experience complications related to bowel motility Outcome: Adequate for Discharge Goal: Will not experience complications related to urinary retention Outcome: Adequate for Discharge   Problem: Pain Managment: Goal: General experience of comfort will improve and/or be controlled Outcome: Adequate for Discharge   Problem: Safety: Goal: Ability to remain free from injury will improve Outcome: Adequate for Discharge   Problem: Skin Integrity: Goal: Risk for impaired skin integrity will decrease Outcome: Adequate for Discharge   Problem: Education: Goal: Ability to describe self-care measures that may prevent or decrease complications (Diabetes Survival Skills Education) will improve Outcome: Adequate for Discharge Goal: Individualized Educational  Video(s) Outcome: Adequate for Discharge   Problem: Coping: Goal: Ability to adjust to condition or change in health will improve Outcome: Adequate for Discharge   Problem: Fluid Volume: Goal: Ability to maintain a balanced intake and output will improve Outcome: Adequate for Discharge   Problem: Health Behavior/Discharge Planning: Goal: Ability to identify and utilize available resources and services will improve Outcome: Adequate for Discharge Goal: Ability to manage health-related needs will improve Outcome: Adequate for Discharge   Problem: Metabolic: Goal: Ability to maintain appropriate glucose levels will improve Outcome: Adequate for Discharge   Problem: Nutritional: Goal: Maintenance of adequate nutrition will improve Outcome: Adequate for Discharge Goal: Progress toward achieving an optimal weight will improve Outcome: Adequate for Discharge   Problem: Skin Integrity: Goal: Risk for impaired skin integrity will decrease Outcome: Adequate for Discharge   Problem: Tissue Perfusion: Goal: Adequacy of tissue perfusion will improve Outcome: Adequate for Discharge   Problem: Education: Goal: Ability to describe self-care measures that may prevent or decrease complications (Diabetes Survival Skills Education) will improve Outcome: Adequate for Discharge Goal: Individualized Educational Video(s) Outcome: Adequate for Discharge   Problem: Cardiac: Goal: Ability to maintain an adequate cardiac output will improve Outcome: Adequate for Discharge   Problem: Health Behavior/Discharge Planning: Goal: Ability to identify and utilize available resources and services will improve Outcome: Adequate for Discharge Goal: Ability to manage health-related needs will improve Outcome: Adequate for Discharge   Problem: Fluid Volume: Goal: Ability to achieve a balanced intake and output will improve Outcome: Adequate for Discharge   Problem: Metabolic: Goal: Ability to maintain  appropriate glucose levels will improve Outcome: Adequate for Discharge   Problem: Nutritional: Goal: Maintenance of adequate nutrition will improve Outcome: Adequate for Discharge Goal: Maintenance of adequate weight for body size and type will improve Outcome: Adequate for Discharge   Problem: Respiratory: Goal: Will regain and/or maintain adequate ventilation Outcome: Adequate for Discharge   Problem: Urinary Elimination: Goal: Ability to achieve and maintain adequate renal perfusion and  functioning will improve Outcome: Adequate for Discharge

## 2024-05-30 ENCOUNTER — Telehealth: Payer: Self-pay | Admitting: *Deleted

## 2024-05-30 NOTE — Transitions of Care (Post Inpatient/ED Visit) (Signed)
" ° °  05/30/2024  Name: Judy Cook MRN: [id number] DOB: [birth year]  Today's TOC FU Call Status: Today's TOC FU Call Status:: Unsuccessful Call (1st Attempt) Unsuccessful Call (1st Attempt) Date: 05/30/24  Attempted to reach the patient regarding the most recent Inpatient visit  Left HIPAA compliant voice message   Follow Up Plan: Additional outreach attempts will be made to reach the patient to complete the Transitions of Care (Post Inpatient/ED visit) call.   Pls call/ message for questions,  Brewer Hitchman Mckinney Coston Mandato, RN, BSN, CCRN Alumnus RN Care Manager  Transitions of Care  [facility] - [facility] [phone]: direct office  "

## 2024-07-07 ENCOUNTER — Ambulatory Visit (HOSPITAL_COMMUNITY): Admitting: Family

## 2024-08-15 ENCOUNTER — Encounter: Admitting: Nurse Practitioner
